# Patient Record
Sex: Female | Born: 1945
Health system: Southern US, Community
[De-identification: ages and names within clinical notes are randomized; demographics above are authoritative.]

## PROBLEM LIST (undated history)

## (undated) DIAGNOSIS — E785 Hyperlipidemia, unspecified: Secondary | ICD-10-CM

## (undated) DIAGNOSIS — I2510 Atherosclerotic heart disease of native coronary artery without angina pectoris: Secondary | ICD-10-CM

## (undated) DIAGNOSIS — F419 Anxiety disorder, unspecified: Secondary | ICD-10-CM

## (undated) DIAGNOSIS — I10 Essential (primary) hypertension: Secondary | ICD-10-CM

## (undated) DIAGNOSIS — S82841A Displaced bimalleolar fracture of right lower leg, initial encounter for closed fracture: Secondary | ICD-10-CM

## (undated) DIAGNOSIS — C50919 Malignant neoplasm of unspecified site of unspecified female breast: Secondary | ICD-10-CM

## (undated) DIAGNOSIS — I499 Cardiac arrhythmia, unspecified: Secondary | ICD-10-CM

## (undated) DIAGNOSIS — M1990 Unspecified osteoarthritis, unspecified site: Secondary | ICD-10-CM

## (undated) DIAGNOSIS — K219 Gastro-esophageal reflux disease without esophagitis: Secondary | ICD-10-CM

## (undated) DIAGNOSIS — Z8619 Personal history of other infectious and parasitic diseases: Secondary | ICD-10-CM

## (undated) DIAGNOSIS — T7840XA Allergy, unspecified, initial encounter: Secondary | ICD-10-CM

## (undated) DIAGNOSIS — E559 Vitamin D deficiency, unspecified: Secondary | ICD-10-CM

## (undated) HISTORY — DX: Hyperlipidemia, unspecified: E78.5

## (undated) HISTORY — DX: Vitamin D deficiency, unspecified: E55.9

## (undated) HISTORY — DX: Personal history of other infectious and parasitic diseases: Z86.19

## (undated) HISTORY — DX: Malignant neoplasm of unspecified site of unspecified female breast: C50.919

## (undated) HISTORY — DX: Allergy, unspecified, initial encounter: T78.40XA

## (undated) HISTORY — DX: Gastro-esophageal reflux disease without esophagitis: K21.9

## (undated) HISTORY — DX: Unspecified osteoarthritis, unspecified site: M19.90

## (undated) HISTORY — DX: Atherosclerotic heart disease of native coronary artery without angina pectoris: I25.10

## (undated) HISTORY — PX: TUBAL LIGATION: SHX77

## (undated) HISTORY — DX: Essential (primary) hypertension: I10

## (undated) HISTORY — PX: CATARACT EXTRACTION, BILATERAL: SHX1313

---
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
## 1984-05-17 HISTORY — PX: CRANIECTOMY FOR DEPRESSED SKULL FRACTURE: SHX5788

## 1992-05-17 DIAGNOSIS — C50919 Malignant neoplasm of unspecified site of unspecified female breast: Secondary | ICD-10-CM

## 1992-05-17 HISTORY — PX: BREAST LUMPECTOMY: SHX2

## 1992-05-17 HISTORY — PX: BREAST BIOPSY: SHX20

## 1992-05-17 HISTORY — DX: Malignant neoplasm of unspecified site of unspecified female breast: C50.919

## 2006-05-17 DIAGNOSIS — C44311 Basal cell carcinoma of skin of nose: Secondary | ICD-10-CM

## 2006-05-17 DIAGNOSIS — I2510 Atherosclerotic heart disease of native coronary artery without angina pectoris: Secondary | ICD-10-CM

## 2006-05-17 HISTORY — PX: OTHER SURGICAL HISTORY: SHX169

## 2006-05-17 HISTORY — DX: Basal cell carcinoma of skin of nose: C44.311

## 2006-05-17 HISTORY — PX: CORONARY ANGIOPLASTY WITH STENT PLACEMENT: SHX49

## 2006-05-17 HISTORY — DX: Atherosclerotic heart disease of native coronary artery without angina pectoris: I25.10

## 2007-03-15 DIAGNOSIS — I219 Acute myocardial infarction, unspecified: Secondary | ICD-10-CM

## 2007-03-15 HISTORY — DX: Acute myocardial infarction, unspecified: I21.9

## 2008-05-31 LAB — HM COLONOSCOPY: HM Colonoscopy: NORMAL

## 2009-05-17 LAB — HM PAP SMEAR: HM Pap smear: NORMAL

## 2011-06-18 LAB — HM MAMMOGRAPHY: HM MAMMO: NORMAL

## 2013-09-19 ENCOUNTER — Ambulatory Visit (INDEPENDENT_AMBULATORY_CARE_PROVIDER_SITE_OTHER): Payer: Medicare Other | Admitting: Internal Medicine

## 2013-09-19 ENCOUNTER — Other Ambulatory Visit (INDEPENDENT_AMBULATORY_CARE_PROVIDER_SITE_OTHER): Payer: Medicare Other

## 2013-09-19 ENCOUNTER — Encounter: Payer: Self-pay | Admitting: Internal Medicine

## 2013-09-19 VITALS — BP 138/86 | HR 71 | Temp 97.7°F | Ht 62.0 in | Wt 154.0 lb

## 2013-09-19 DIAGNOSIS — Z1239 Encounter for other screening for malignant neoplasm of breast: Secondary | ICD-10-CM

## 2013-09-19 DIAGNOSIS — E559 Vitamin D deficiency, unspecified: Secondary | ICD-10-CM

## 2013-09-19 DIAGNOSIS — E785 Hyperlipidemia, unspecified: Secondary | ICD-10-CM

## 2013-09-19 DIAGNOSIS — Z23 Encounter for immunization: Secondary | ICD-10-CM

## 2013-09-19 DIAGNOSIS — I2510 Atherosclerotic heart disease of native coronary artery without angina pectoris: Secondary | ICD-10-CM

## 2013-09-19 DIAGNOSIS — M899 Disorder of bone, unspecified: Secondary | ICD-10-CM

## 2013-09-19 DIAGNOSIS — Z0000 Encounter for general adult medical examination without abnormal findings: Secondary | ICD-10-CM

## 2013-09-19 DIAGNOSIS — I10 Essential (primary) hypertension: Secondary | ICD-10-CM

## 2013-09-19 DIAGNOSIS — M8580 Other specified disorders of bone density and structure, unspecified site: Secondary | ICD-10-CM

## 2013-09-19 DIAGNOSIS — M949 Disorder of cartilage, unspecified: Secondary | ICD-10-CM

## 2013-09-19 DIAGNOSIS — K219 Gastro-esophageal reflux disease without esophagitis: Secondary | ICD-10-CM | POA: Insufficient documentation

## 2013-09-19 DIAGNOSIS — Z853 Personal history of malignant neoplasm of breast: Secondary | ICD-10-CM | POA: Insufficient documentation

## 2013-09-19 LAB — CBC WITH DIFFERENTIAL/PLATELET
BASOS PCT: 0.4 % (ref 0.0–3.0)
Basophils Absolute: 0 10*3/uL (ref 0.0–0.1)
EOS ABS: 0.2 10*3/uL (ref 0.0–0.7)
Eosinophils Relative: 3.4 % (ref 0.0–5.0)
HCT: 43.6 % (ref 36.0–46.0)
HEMOGLOBIN: 15 g/dL (ref 12.0–15.0)
Lymphocytes Relative: 25.5 % (ref 12.0–46.0)
Lymphs Abs: 1.9 10*3/uL (ref 0.7–4.0)
MCHC: 34.3 g/dL (ref 30.0–36.0)
MCV: 92.7 fl (ref 78.0–100.0)
MONO ABS: 0.6 10*3/uL (ref 0.1–1.0)
Monocytes Relative: 8 % (ref 3.0–12.0)
NEUTROS ABS: 4.6 10*3/uL (ref 1.4–7.7)
Neutrophils Relative %: 62.7 % (ref 43.0–77.0)
Platelets: 299 10*3/uL (ref 150.0–400.0)
RBC: 4.7 Mil/uL (ref 3.87–5.11)
RDW: 12.7 % (ref 11.5–15.5)
WBC: 7.4 10*3/uL (ref 4.0–10.5)

## 2013-09-19 LAB — HEPATIC FUNCTION PANEL
ALT: 20 U/L (ref 0–35)
AST: 24 U/L (ref 0–37)
Albumin: 4.4 g/dL (ref 3.5–5.2)
Alkaline Phosphatase: 89 U/L (ref 39–117)
Bilirubin, Direct: 0.1 mg/dL (ref 0.0–0.3)
TOTAL PROTEIN: 7.6 g/dL (ref 6.0–8.3)
Total Bilirubin: 0.6 mg/dL (ref 0.2–1.2)

## 2013-09-19 LAB — TSH: TSH: 1.6 u[IU]/mL (ref 0.35–4.50)

## 2013-09-19 LAB — URINALYSIS, ROUTINE W REFLEX MICROSCOPIC
Bilirubin Urine: NEGATIVE
HGB URINE DIPSTICK: NEGATIVE
KETONES UR: NEGATIVE
Leukocytes, UA: NEGATIVE
Nitrite: NEGATIVE
Specific Gravity, Urine: 1.01 (ref 1.000–1.030)
TOTAL PROTEIN, URINE-UPE24: NEGATIVE
URINE GLUCOSE: NEGATIVE
Urobilinogen, UA: 0.2 (ref 0.0–1.0)
pH: 7 (ref 5.0–8.0)

## 2013-09-19 LAB — BASIC METABOLIC PANEL
BUN: 10 mg/dL (ref 6–23)
CHLORIDE: 100 meq/L (ref 96–112)
CO2: 27 meq/L (ref 19–32)
Calcium: 9.6 mg/dL (ref 8.4–10.5)
Creatinine, Ser: 0.8 mg/dL (ref 0.4–1.2)
GFR: 72.97 mL/min (ref >60.00)
Glucose, Bld: 101 mg/dL — ABNORMAL HIGH (ref 70–99)
Potassium: 4.1 mEq/L (ref 3.5–5.1)
SODIUM: 135 meq/L (ref 135–145)

## 2013-09-19 LAB — LIPID PANEL
Cholesterol: 167 mg/dL (ref 0–200)
HDL: 46.2 mg/dL (ref >39.00)
LDL Cholesterol: 88 mg/dL (ref 0–99)
TRIGLYCERIDES: 162 mg/dL — AB (ref 0.0–149.0)
Total CHOL/HDL Ratio: 4
VLDL: 32.4 mg/dL (ref 0.0–40.0)

## 2013-09-19 MED ORDER — ATORVASTATIN CALCIUM 20 MG PO TABS: 20.0000 mg | ORAL_TABLET | Freq: Every day | ORAL | Status: DC

## 2013-09-19 MED ORDER — METOPROLOL TARTRATE 25 MG PO TABS: 25.0000 mg | ORAL_TABLET | Freq: Two times a day (BID) | ORAL | Status: DC

## 2013-09-19 NOTE — Progress Notes (Signed)
Subjective:    Patient ID: Sarah Hendricks, female    DOB: [birth year], 68 y.o.   MRN: [id number]  HPI  New patient to me and our practice, here today to establish care with primary care provider  Reviewed chronic issues today:  CAD. Reports stent in 2008. On medical management for same. No angina symptoms, chest pain, edema.  Hypertension - the patient reports compliance with medication(s) as prescribed. Denies adverse side effects.  Dyslipidemia. On statin for same. the patient reports compliance with medication(s) as prescribed. Denies adverse side effects.  Breast cancer history 1994.  Past Medical History  Diagnosis Date  . GERD (gastroesophageal reflux disease)   . Breast cancer 1994    lumpectomy and XRT, no chemo  . Hyperlipidemia   . Hypertension   . History of chicken pox   . CAD (coronary artery disease) 2008    "stent"  . Vitamin D deficiency    Family History  Problem Relation Age of Onset  . Breast cancer Mother   . Hypertension Mother   . Heart disease Mother   . Stroke Mother   . Arthritis Father    History  Substance Use Topics  . Smoking status: Never Smoker   . Smokeless tobacco: Not on file  . Alcohol Use: No    Review of Systems  Constitutional: Negative for fatigue and unexpected weight change.  Respiratory: Negative for cough, shortness of breath and wheezing.   Cardiovascular: Negative for chest pain, palpitations and leg swelling.  Gastrointestinal: Negative for nausea, abdominal pain and diarrhea.  Neurological: Negative for dizziness, weakness, light-headedness and headaches.  Psychiatric/Behavioral: Negative for dysphoric mood. The patient is not nervous/anxious.   All other systems reviewed and are negative.      Objective:   Physical Exam  BP 138/86  Pulse 71  Temp(Src) 97.7 F (36.5 C) (Oral)  Ht 5\' 2"  (1.575 m)  Wt 154 lb (69.854 kg)  BMI 28.16 kg/m2  SpO2 96% Wt Readings from Last 3 Encounters:  09/19/13 154 lb  (69.854 kg)   Constitutional: She appears well-developed and well-nourished. No distress.  HENT: Head: Normocephalic and atraumatic. Ears: B TMs ok, no erythema or effusion; Nose: Nose normal. Mouth/Throat: Oropharynx is clear and moist. No oropharyngeal exudate.  Eyes: Conjunctivae and EOM are normal. Pupils are equal, round, and reactive to light. No scleral icterus.  Neck: Normal range of motion. Neck supple. No JVD present. No thyromegaly present.  Cardiovascular: Normal rate, regular rhythm and normal heart sounds.  No murmur heard. No BLE edema. Pulmonary/Chest: Effort normal and breath sounds normal. No respiratory distress. She has no wheezes.  Abdominal: Soft. Bowel sounds are normal. She exhibits no distension. There is no tenderness. no masses Musculoskeletal: Normal range of motion, no joint effusions. No gross deformities Neurological: She is alert and oriented to person, place, and time. No cranial nerve deficit. Coordination, balance, strength, speech and gait are normal.  Skin: Skin is warm and dry. No rash noted. No erythema.  Psychiatric: She has a normal mood and affect. Her behavior is normal. Judgment and thought content normal.    No results found for this basename: WBC,  HGB,  HCT,  PLT,  GLUCOSE,  CHOL,  TRIG,  HDL,  LDLDIRECT,  LDLCALC,  ALT,  AST,  NA,  K,  CL,  CREATININE,  BUN,  CO2,  TSH,  PSA,  INR,  GLUF,  HGBA1C,  MICROALBUR    Patient was never admitted.     Assessment &  Plan:   CPX/v70.0 - Patient has been counseled on age-appropriate routine health concerns for screening and prevention. These are reviewed and up-to-date. Immunizations are up-to-date or declined. Labs ordered - ROI to be reviewed. Refer for mammo, DEXA and get colo report  Problem List Items Addressed This Visit   CAD (coronary artery disease)     Stent following MI in 2008 Send for ROI from [place] No anginal symptoms Medication mgmt ongoing Check labs, titrate meds as needed     Relevant Medications      aspirin 81 MG tablet      atorvastatin (LIPITOR) tablet      metoprolol tartrate (LOPRESSOR) tablet   Hyperlipidemia     On statin Check labs, titrate as needed    Relevant Medications      aspirin 81 MG tablet      atorvastatin (LIPITOR) tablet      metoprolol tartrate (LOPRESSOR) tablet   Hypertension      BP Readings from Last 3 Encounters:  09/19/13 138/86   The current medical regimen is effective;  continue present plan and medications.     Relevant Medications      aspirin 81 MG tablet      atorvastatin (LIPITOR) tablet      metoprolol tartrate (LOPRESSOR) tablet   Vitamin D deficiency     Check level     Relevant Orders      Vit D  25 hydroxy (rtn osteoporosis monitoring) (Completed)    Other Visit Diagnoses   Routine general medical examination at a health care facility    -  Primary    Relevant Orders       Basic metabolic panel (Completed)       CBC with Differential (Completed)       Hepatic function panel (Completed)       Lipid panel (Completed)       TSH (Completed)       Urinalysis, Routine w reflex microscopic (Completed)    Other screening breast examination        Relevant Orders       MM DIGITAL SCREENING BILATERAL    Osteopenia        Relevant Orders       DG Bone Density    Need for prophylactic vaccination against Streptococcus pneumoniae (pneumococcus)        Relevant Orders       Pneumococcal polysaccharide vaccine 23-valent greater than or equal to 2yo subcutaneous/IM

## 2013-09-19 NOTE — Patient Instructions (Addendum)
It was good to see you today.  We have reviewed your prior records including labs and tests today  we will send to your prior provider(s) for "release of records" as discussed today.  Health Maintenance reviewed - Pneumonia vaccine updated today, we'll perform tetanus at next visit Refer for mammogram and bone density Other screening up-to-date  Test(s) ordered today. Your results will be released to [place] (or called to you) after review, usually within 72hours after test completion. If any changes need to be made, you will be notified at that same time.  Medications reviewed and updated, no changes recommended at this time. Refill on medication(s) as discussed today.  Please schedule followup in 6 months for semiannual exam and labs, call sooner if problems.  Health Maintenance, Female A healthy lifestyle and preventative care can promote health and wellness.  Maintain regular health, dental, and eye exams.  Eat a healthy diet. Foods like vegetables, fruits, whole grains, low-fat dairy products, and lean protein foods contain the nutrients you need without too many calories. Decrease your intake of foods high in solid fats, added sugars, and salt. Get information about a proper diet from your caregiver, if necessary.  Regular physical exercise is one of the most important things you can do for your health. Most adults should get at least 150 minutes of moderate-intensity exercise (any activity that increases your heart rate and causes you to sweat) each week. In addition, most adults need muscle-strengthening exercises on 2 or more days a week.   Maintain a healthy weight. The body mass index (BMI) is a screening tool to identify possible weight problems. It provides an estimate of body fat based on height and weight. Your caregiver can help determine your BMI, and can help you achieve or maintain a healthy weight. For adults 20 years and older:  A BMI below 18.5 is considered  underweight.  A BMI of 18.5 to 24.9 is normal.  A BMI of 25 to 29.9 is considered overweight.  A BMI of 30 and above is considered obese.  Maintain normal blood lipids and cholesterol by exercising and minimizing your intake of saturated fat. Eat a balanced diet with plenty of fruits and vegetables. Blood tests for lipids and cholesterol should begin at age 40 and be repeated every 5 years. If your lipid or cholesterol levels are high, you are over 50, or you are a high risk for heart disease, you may need your cholesterol levels checked more frequently.Ongoing high lipid and cholesterol levels should be treated with medicines if diet and exercise are not effective.  If you smoke, find out from your caregiver how to quit. If you do not use tobacco, do not start.  Lung cancer screening is recommended for adults aged 42 80 years who are at high risk for developing lung cancer because of a history of smoking. Yearly low-dose computed tomography (CT) is recommended for people who have at least a 30-pack-year history of smoking and are a current smoker or have quit within the past 15 years. A pack year of smoking is smoking an average of 1 pack of cigarettes a day for 1 year (for example: 1 pack a day for 30 years or 2 packs a day for 15 years). Yearly screening should continue until the smoker has stopped smoking for at least 15 years. Yearly screening should also be stopped for people who develop a health problem that would prevent them from having lung cancer treatment.  If you are pregnant, do  not drink alcohol. If you are breastfeeding, be very cautious about drinking alcohol. If you are not pregnant and choose to drink alcohol, do not exceed 1 drink per day. One drink is considered to be 12 ounces (355 mL) of beer, 5 ounces (148 mL) of wine, or 1.5 ounces (44 mL) of liquor.  Avoid use of street drugs. Do not share needles with anyone. Ask for help if you need support or instructions about stopping  the use of drugs.  High blood pressure causes heart disease and increases the risk of stroke. Blood pressure should be checked at least every 1 to 2 years. Ongoing high blood pressure should be treated with medicines, if weight loss and exercise are not effective.  If you are 50 to 67 years old, ask your caregiver if you should take aspirin to prevent strokes.  Diabetes screening involves taking a blood sample to check your fasting blood sugar level. This should be done once every 3 years, after age 31, if you are within normal weight and without risk factors for diabetes. Testing should be considered at a younger age or be carried out more frequently if you are overweight and have at least 1 risk factor for diabetes.  Breast cancer screening is essential preventative care for women. You should practice "breast self-awareness." This means understanding the normal appearance and feel of your breasts and may include breast self-examination. Any changes detected, no matter how small, should be reported to a caregiver. Women in their 2s and 30s should have a clinical breast exam (CBE) by a caregiver as part of a regular health exam every 1 to 3 years. After age 48, women should have a CBE every year. Starting at age 3, women should consider having a mammogram (breast X-ray) every year. Women who have a family history of breast cancer should talk to their caregiver about genetic screening. Women at a high risk of breast cancer should talk to their caregiver about having an MRI and a mammogram every year.  Breast cancer gene (BRCA)-related cancer risk assessment is recommended for women who have family members with BRCA-related cancers. BRCA-related cancers include breast, ovarian, tubal, and peritoneal cancers. Having family members with these cancers may be associated with an increased risk for harmful changes (mutations) in the breast cancer genes BRCA1 and BRCA2. Results of the assessment will determine  the need for genetic counseling and BRCA1 and BRCA2 testing.  The Pap test is a screening test for cervical cancer. Women should have a Pap test starting at age 58. Between ages 68 and 75, Pap tests should be repeated every 2 years. Beginning at age 32, you should have a Pap test every 3 years as long as the past 3 Pap tests have been normal. If you had a hysterectomy for a problem that was not cancer or a condition that could lead to cancer, then you no longer need Pap tests. If you are between ages 3 and 78, and you have had normal Pap tests going back 10 years, you no longer need Pap tests. If you have had past treatment for cervical cancer or a condition that could lead to cancer, you need Pap tests and screening for cancer for at least 20 years after your treatment. If Pap tests have been discontinued, risk factors (such as a new sexual partner) need to be reassessed to determine if screening should be resumed. Some women have medical problems that increase the chance of getting cervical cancer. In these cases, your  caregiver may recommend more frequent screening and Pap tests.  The human papillomavirus (HPV) test is an additional test that may be used for cervical cancer screening. The HPV test looks for the virus that can cause the cell changes on the cervix. The cells collected during the Pap test can be tested for HPV. The HPV test could be used to screen women aged 63 years and older, and should be used in women of any age who have unclear Pap test results. After the age of 58, women should have HPV testing at the same frequency as a Pap test.  Colorectal cancer can be detected and often prevented. Most routine colorectal cancer screening begins at the age of 50 and continues through age 63. However, your caregiver may recommend screening at an earlier age if you have risk factors for colon cancer. On a yearly basis, your caregiver may provide home test kits to check for hidden blood in the stool.  Use of a small camera at the end of a tube, to directly examine the colon (sigmoidoscopy or colonoscopy), can detect the earliest forms of colorectal cancer. Talk to your caregiver about this at age 58, when routine screening begins. Direct examination of the colon should be repeated every 5 to 10 years through age 91, unless early forms of pre-cancerous polyps or small growths are found.  Hepatitis C blood testing is recommended for all people born from [birth year] through 1964 and any individual with known risks for hepatitis C.  Practice safe sex. Use condoms and avoid high-risk sexual practices to reduce the spread of sexually transmitted infections (STIs). Sexually active women aged 31 and younger should be checked for Chlamydia, which is a common sexually transmitted infection. Older women with new or multiple partners should also be tested for Chlamydia. Testing for other STIs is recommended if you are sexually active and at increased risk.  Osteoporosis is a disease in which the bones lose minerals and strength with aging. This can result in serious bone fractures. The risk of osteoporosis can be identified using a bone density scan. Women ages 88 and over and women at risk for fractures or osteoporosis should discuss screening with their caregivers. Ask your caregiver whether you should be taking a calcium supplement or vitamin D to reduce the rate of osteoporosis.  Menopause can be associated with physical symptoms and risks. Hormone replacement therapy is available to decrease symptoms and risks. You should talk to your caregiver about whether hormone replacement therapy is right for you.  Use sunscreen. Apply sunscreen liberally and repeatedly throughout the day. You should seek shade when your shadow is shorter than you. Protect yourself by wearing long sleeves, pants, a wide-brimmed hat, and sunglasses year round, whenever you are outdoors.  Notify your caregiver of new moles or changes in moles,  especially if there is a change in shape or color. Also notify your caregiver if a mole is larger than the size of a pencil eraser.  Stay current with your immunizations. Document Released: 11/16/2010 Document Revised: 08/28/2012 Document Reviewed: 11/16/2010 [facility] Patient Information 2014 [place].

## 2013-09-19 NOTE — Progress Notes (Signed)
Pre visit review using our clinic review tool, if applicable. No additional management support is needed unless otherwise documented below in the visit note. 

## 2013-09-20 ENCOUNTER — Encounter: Payer: Self-pay | Admitting: Internal Medicine

## 2013-09-20 LAB — VITAMIN D 25 HYDROXY (VIT D DEFICIENCY, FRACTURES): VIT D 25 HYDROXY: 39 ng/mL (ref 30–89)

## 2013-09-20 NOTE — Assessment & Plan Note (Signed)
Check level 

## 2013-09-20 NOTE — Assessment & Plan Note (Signed)
Stent following MI in 2008 Send for ROI from [place] No anginal symptoms Medication mgmt ongoing Check labs, titrate meds as needed

## 2013-09-20 NOTE — Assessment & Plan Note (Signed)
BP Readings from Last 3 Encounters:  09/19/13 138/86   The current medical regimen is effective;  continue present plan and medications.

## 2013-09-20 NOTE — Assessment & Plan Note (Signed)
On statin Check labs, titrate as needed

## 2013-10-01 ENCOUNTER — Ambulatory Visit
Admission: RE | Admit: 2013-10-01 | Discharge: 2013-10-01 | Disposition: A | Discharge status: Home / Self Care | Payer: Medicare Other | Source: Ambulatory Visit | Attending: Internal Medicine | Admitting: Internal Medicine

## 2013-10-01 DIAGNOSIS — Z1239 Encounter for other screening for malignant neoplasm of breast: Secondary | ICD-10-CM

## 2014-03-22 ENCOUNTER — Ambulatory Visit: Payer: Medicare Other | Admitting: Internal Medicine

## 2014-05-30 ENCOUNTER — Other Ambulatory Visit (INDEPENDENT_AMBULATORY_CARE_PROVIDER_SITE_OTHER): Payer: Medicare Other

## 2014-05-30 ENCOUNTER — Ambulatory Visit (INDEPENDENT_AMBULATORY_CARE_PROVIDER_SITE_OTHER): Payer: Medicare Other | Admitting: Internal Medicine

## 2014-05-30 ENCOUNTER — Encounter: Payer: Self-pay | Admitting: Internal Medicine

## 2014-05-30 VITALS — BP 138/84 | HR 64 | Temp 97.5°F | Ht 62.0 in | Wt 155.5 lb

## 2014-05-30 DIAGNOSIS — E785 Hyperlipidemia, unspecified: Secondary | ICD-10-CM

## 2014-05-30 DIAGNOSIS — M8580 Other specified disorders of bone density and structure, unspecified site: Secondary | ICD-10-CM

## 2014-05-30 DIAGNOSIS — Z0000 Encounter for general adult medical examination without abnormal findings: Secondary | ICD-10-CM

## 2014-05-30 DIAGNOSIS — I2510 Atherosclerotic heart disease of native coronary artery without angina pectoris: Secondary | ICD-10-CM

## 2014-05-30 DIAGNOSIS — Z23 Encounter for immunization: Secondary | ICD-10-CM

## 2014-05-30 DIAGNOSIS — I10 Essential (primary) hypertension: Secondary | ICD-10-CM

## 2014-05-30 DIAGNOSIS — M810 Age-related osteoporosis without current pathological fracture: Secondary | ICD-10-CM | POA: Insufficient documentation

## 2014-05-30 LAB — HEPATIC FUNCTION PANEL
ALBUMIN: 4.3 g/dL (ref 3.5–5.2)
ALT: 18 U/L (ref 0–35)
AST: 19 U/L (ref 0–37)
Alkaline Phosphatase: 107 U/L (ref 39–117)
BILIRUBIN DIRECT: 0.1 mg/dL (ref 0.0–0.3)
TOTAL PROTEIN: 7.7 g/dL (ref 6.0–8.3)
Total Bilirubin: 0.6 mg/dL (ref 0.2–1.2)

## 2014-05-30 LAB — BASIC METABOLIC PANEL
BUN: 12 mg/dL (ref 6–23)
CALCIUM: 9.7 mg/dL (ref 8.4–10.5)
CO2: 24 mEq/L (ref 19–32)
Chloride: 102 mEq/L (ref 96–112)
Creatinine, Ser: 0.83 mg/dL (ref 0.40–1.20)
GFR: 72.82 mL/min (ref >60.00)
Glucose, Bld: 107 mg/dL — ABNORMAL HIGH (ref 70–99)
Potassium: 4.1 mEq/L (ref 3.5–5.1)
SODIUM: 137 meq/L (ref 135–145)

## 2014-05-30 LAB — CBC WITH DIFFERENTIAL/PLATELET
BASOS ABS: 0 10*3/uL (ref 0.0–0.1)
Basophils Relative: 0.5 % (ref 0.0–3.0)
EOS ABS: 0.3 10*3/uL (ref 0.0–0.7)
EOS PCT: 3.8 % (ref 0.0–5.0)
HEMATOCRIT: 44.3 % (ref 36.0–46.0)
Hemoglobin: 15 g/dL (ref 12.0–15.0)
Lymphocytes Relative: 25 % (ref 12.0–46.0)
Lymphs Abs: 1.7 10*3/uL (ref 0.7–4.0)
MCHC: 33.8 g/dL (ref 30.0–36.0)
MCV: 92.9 fl (ref 78.0–100.0)
MONO ABS: 0.8 10*3/uL (ref 0.1–1.0)
Monocytes Relative: 11.5 % (ref 3.0–12.0)
NEUTROS ABS: 4.1 10*3/uL (ref 1.4–7.7)
Neutrophils Relative %: 59.2 % (ref 43.0–77.0)
Platelets: 273 10*3/uL (ref 150.0–400.0)
RBC: 4.77 Mil/uL (ref 3.87–5.11)
RDW: 13 % (ref 11.5–15.5)
WBC: 7 10*3/uL (ref 4.0–10.5)

## 2014-05-30 LAB — LIPID PANEL
CHOL/HDL RATIO: 5
CHOLESTEROL: 184 mg/dL (ref 0–200)
HDL: 40.4 mg/dL (ref >39.00)
NonHDL: 143.6
Triglycerides: 287 mg/dL — ABNORMAL HIGH (ref 0.0–149.0)
VLDL: 57.4 mg/dL — ABNORMAL HIGH (ref 0.0–40.0)

## 2014-05-30 LAB — TSH: TSH: 1.69 u[IU]/mL (ref 0.35–4.50)

## 2014-05-30 LAB — LDL CHOLESTEROL, DIRECT: Direct LDL: 101 mg/dL

## 2014-05-30 MED ORDER — ATORVASTATIN CALCIUM 10 MG PO TABS: 10.0000 mg | ORAL_TABLET | Freq: Every day | ORAL | Status: DC

## 2014-05-30 MED ORDER — METOPROLOL TARTRATE 25 MG PO TABS: 12.5000 mg | ORAL_TABLET | Freq: Two times a day (BID) | ORAL | Status: DC

## 2014-05-30 NOTE — Patient Instructions (Addendum)
It was good to see you today.  We have reviewed your prior records including labs and tests today  Prevnar 13 pneumonia vaccine updated today - consider Tdap next visit  Health Maintenance reviewed - all recommended immunizations and age-appropriate screenings are up-to-date.  Test(s) ordered today. Your results will be released to [place] (or called to you) after review, usually within 72hours after test completion. If any changes need to be made, you will be notified at that same time.  Medications reviewed and updated, no changes recommended at this time.  Please schedule followup in 12 months for annual exam and labs, call sooner if problems.  Health Maintenance Adopting a healthy lifestyle and getting preventive care can go a long way to promote health and wellness. Talk with your health care provider about what schedule of regular examinations is right for you. This is a good chance for you to check in with your provider about disease prevention and staying healthy. In between checkups, there are plenty of things you can do on your own. Experts have done a lot of research about which lifestyle changes and preventive measures are most likely to keep you healthy. Ask your health care provider for more information. WEIGHT AND DIET  Eat a healthy diet  Be sure to include plenty of vegetables, fruits, low-fat dairy products, and lean protein.  Do not eat a lot of foods high in solid fats, added sugars, or salt.  Get regular exercise. This is one of the most important things you can do for your health.  Most adults should exercise for at least 150 minutes each week. The exercise should increase your heart rate and make you sweat (moderate-intensity exercise).  Most adults should also do strengthening exercises at least twice a week. This is in addition to the moderate-intensity exercise.  Maintain a healthy weight  Body mass index (BMI) is a measurement that can be used to identify  possible weight problems. It estimates body fat based on height and weight. Your health care provider can help determine your BMI and help you achieve or maintain a healthy weight.  For females 54 years of age and older:   A BMI below 18.5 is considered underweight.  A BMI of 18.5 to 24.9 is normal.  A BMI of 25 to 29.9 is considered overweight.  A BMI of 30 and above is considered obese.  Watch levels of cholesterol and blood lipids  You should start having your blood tested for lipids and cholesterol at 68 years of age, then have this test every 5 years.  You may need to have your cholesterol levels checked more often if:  Your lipid or cholesterol levels are high.  You are older than 68 years of age.  You are at high risk for heart disease.  CANCER SCREENING   Lung Cancer  Lung cancer screening is recommended for adults 83-47 years old who are at high risk for lung cancer because of a history of smoking.  A yearly low-dose CT scan of the lungs is recommended for people who:  Currently smoke.  Have quit within the past 15 years.  Have at least a 30-pack-year history of smoking. A pack year is smoking an average of one pack of cigarettes a day for 1 year.  Yearly screening should continue until it has been 15 years since you quit.  Yearly screening should stop if you develop a health problem that would prevent you from having lung cancer treatment.  Breast Cancer  Practice breast self-awareness. This means understanding how your breasts normally appear and feel.  It also means doing regular breast self-exams. Let your health care provider know about any changes, no matter how small.  If you are in your 20s or 30s, you should have a clinical breast exam (CBE) by a health care provider every 1-3 years as part of a regular health exam.  If you are 51 or older, have a CBE every year. Also consider having a breast X-ray (mammogram) every year.  If you have a family  history of breast cancer, talk to your health care provider about genetic screening.  If you are at high risk for breast cancer, talk to your health care provider about having an MRI and a mammogram every year.  Breast cancer gene (BRCA) assessment is recommended for women who have family members with BRCA-related cancers. BRCA-related cancers include:  Breast.  Ovarian.  Tubal.  Peritoneal cancers.  Results of the assessment will determine the need for genetic counseling and BRCA1 and BRCA2 testing. Cervical Cancer Routine pelvic examinations to screen for cervical cancer are no longer recommended for nonpregnant women who are considered low risk for cancer of the pelvic organs (ovaries, uterus, and vagina) and who do not have symptoms. A pelvic examination may be necessary if you have symptoms including those associated with pelvic infections. Ask your health care provider if a screening pelvic exam is right for you.   The Pap test is the screening test for cervical cancer for women who are considered at risk.  If you had a hysterectomy for a problem that was not cancer or a condition that could lead to cancer, then you no longer need Pap tests.  If you are older than 65 years, and you have had normal Pap tests for the past 10 years, you no longer need to have Pap tests.  If you have had past treatment for cervical cancer or a condition that could lead to cancer, you need Pap tests and screening for cancer for at least 20 years after your treatment.  If you no longer get a Pap test, assess your risk factors if they change (such as having a new sexual partner). This can affect whether you should start being screened again.  Some women have medical problems that increase their chance of getting cervical cancer. If this is the case for you, your health care provider may recommend more frequent screening and Pap tests.  The human papillomavirus (HPV) test is another test that may be used  for cervical cancer screening. The HPV test looks for the virus that can cause cell changes in the cervix. The cells collected during the Pap test can be tested for HPV.  The HPV test can be used to screen women 62 years of age and older. Getting tested for HPV can extend the interval between normal Pap tests from three to five years.  An HPV test also should be used to screen women of any age who have unclear Pap test results.  After 68 years of age, women should have HPV testing as often as Pap tests.  Colorectal Cancer  This type of cancer can be detected and often prevented.  Routine colorectal cancer screening usually begins at 68 years of age and continues through 68 years of age.  Your health care provider may recommend screening at an earlier age if you have risk factors for colon cancer.  Your health care provider may also recommend using home test kits  to check for hidden blood in the stool.  A small camera at the end of a tube can be used to examine your colon directly (sigmoidoscopy or colonoscopy). This is done to check for the earliest forms of colorectal cancer.  Routine screening usually begins at age 6.  Direct examination of the colon should be repeated every 5-10 years through 68 years of age. However, you may need to be screened more often if early forms of precancerous polyps or small growths are found. Skin Cancer  Check your skin from head to toe regularly.  Tell your health care provider about any new moles or changes in moles, especially if there is a change in a mole's shape or color.  Also tell your health care provider if you have a mole that is larger than the size of a pencil eraser.  Always use sunscreen. Apply sunscreen liberally and repeatedly throughout the day.  Protect yourself by wearing long sleeves, pants, a wide-brimmed hat, and sunglasses whenever you are outside. HEART DISEASE, DIABETES, AND HIGH BLOOD PRESSURE   Have your blood pressure  checked at least every 1-2 years. High blood pressure causes heart disease and increases the risk of stroke.  If you are between 72 years and 61 years old, ask your health care provider if you should take aspirin to prevent strokes.  Have regular diabetes screenings. This involves taking a blood sample to check your fasting blood sugar level.  If you are at a normal weight and have a low risk for diabetes, have this test once every three years after 68 years of age.  If you are overweight and have a high risk for diabetes, consider being tested at a younger age or more often. PREVENTING INFECTION  Hepatitis B  If you have a higher risk for hepatitis B, you should be screened for this virus. You are considered at high risk for hepatitis B if:  You were born in a country where hepatitis B is common. Ask your health care provider which countries are considered high risk.  Your parents were born in a high-risk country, and you have not been immunized against hepatitis B (hepatitis B vaccine).  You have HIV or AIDS.  You use needles to inject street drugs.  You live with someone who has hepatitis B.  You have had sex with someone who has hepatitis B.  You get hemodialysis treatment.  You take certain medicines for conditions, including cancer, organ transplantation, and autoimmune conditions. Hepatitis C  Blood testing is recommended for:  Everyone born from [birth year] through 1964.  Anyone with known risk factors for hepatitis C. Sexually transmitted infections (STIs)  You should be screened for sexually transmitted infections (STIs) including gonorrhea and chlamydia if:  You are sexually active and are younger than 68 years of age.  You are older than 68 years of age and your health care provider tells you that you are at risk for this type of infection.  Your sexual activity has changed since you were last screened and you are at an increased risk for chlamydia or gonorrhea. Ask  your health care provider if you are at risk.  If you do not have HIV, but are at risk, it may be recommended that you take a prescription medicine daily to prevent HIV infection. This is called pre-exposure prophylaxis (PrEP). You are considered at risk if:  You are sexually active and do not regularly use condoms or know the HIV status of your partner(s).  You  take drugs by injection.  You are sexually active with a partner who has HIV. Talk with your health care provider about whether you are at high risk of being infected with HIV. If you choose to begin PrEP, you should first be tested for HIV. You should then be tested every 3 months for as long as you are taking PrEP.  PREGNANCY   If you are premenopausal and you may become pregnant, ask your health care provider about preconception counseling.  If you may become pregnant, take 400 to 800 micrograms (mcg) of folic acid every day.  If you want to prevent pregnancy, talk to your health care provider about birth control (contraception). OSTEOPOROSIS AND MENOPAUSE   Osteoporosis is a disease in which the bones lose minerals and strength with aging. This can result in serious bone fractures. Your risk for osteoporosis can be identified using a bone density scan.  If you are 79 years of age or older, or if you are at risk for osteoporosis and fractures, ask your health care provider if you should be screened.  Ask your health care provider whether you should take a calcium or vitamin D supplement to lower your risk for osteoporosis.  Menopause may have certain physical symptoms and risks.  Hormone replacement therapy may reduce some of these symptoms and risks. Talk to your health care provider about whether hormone replacement therapy is right for you.  HOME CARE INSTRUCTIONS   Schedule regular health, dental, and eye exams.  Stay current with your immunizations.   Do not use any tobacco products including cigarettes, chewing  tobacco, or electronic cigarettes.  If you are pregnant, do not drink alcohol.  If you are breastfeeding, limit how much and how often you drink alcohol.  Limit alcohol intake to no more than 1 drink per day for nonpregnant women. One drink equals 12 ounces of beer, 5 ounces of wine, or 1 ounces of hard liquor.  Do not use street drugs.  Do not share needles.  Ask your health care provider for help if you need support or information about quitting drugs.  Tell your health care provider if you often feel depressed.  Tell your health care provider if you have ever been abused or do not feel safe at home. Document Released: 11/16/2010 Document Revised: 09/17/2013 Document Reviewed: 04/04/2013 [facility] Patient Information 2015 [place], [place]. This information is not intended to replace advice given to you by your health care provider. Make sure you discuss any questions you have with your health care provider.

## 2014-05-30 NOTE — Assessment & Plan Note (Signed)
On statin -dose adjusted to reflect how pt is taking same (10 mg atorva qd) Check labs, titrate as needed

## 2014-05-30 NOTE — Assessment & Plan Note (Signed)
Stent following MI in 2008 while living in [place] No anginal symptoms Medication mgmt ongoing Check labs, titrate meds as needed

## 2014-05-30 NOTE — Assessment & Plan Note (Signed)
BP Readings from Last 3 Encounters:  05/30/14 138/84  09/19/13 138/86   The current medical regimen is effective;  continue present plan and medications.

## 2014-05-30 NOTE — Progress Notes (Signed)
Pre visit review using our clinic review tool, if applicable. No additional management support is needed unless otherwise documented below in the visit note. 

## 2014-05-30 NOTE — Progress Notes (Signed)
Subjective:    Patient ID: Sarah Hendricks, female    DOB: 04/12/1946, 69 y.o.   MRN: [id number]  HPI   Here for medicare wellness  Diet: heart healthy  Physical activity: sedentary Depression/mood screen: negative Hearing: intact to whispered voice Visual acuity: grossly normal, performs annual eye exam  ADLs: capable Fall risk: none Home safety: good Cognitive evaluation: intact to orientation, naming, recall and repetition EOL planning: adv directives, full code/ I agree  I have personally reviewed and have noted 1. The patient's medical and social history 2. Their use of alcohol, tobacco or illicit drugs 3. Their current medications and supplements 4. The patient's functional ability including ADL's, fall risks, home safety risks and hearing or visual impairment. 5. Diet and physical activities 6. Evidence for depression or mood disorders  Also reviewed chronic medical issues and interval medical events  Past Medical History  Diagnosis Date  . GERD (gastroesophageal reflux disease)   . Breast cancer 1994    lumpectomy and XRT, no chemo  . Hyperlipidemia   . Hypertension   . History of chicken pox   . CAD (coronary artery disease) 2008    "stent"  . Vitamin D deficiency    Family History  Problem Relation Age of Onset  . Breast cancer Mother   . Hypertension Mother   . Heart disease Mother   . Stroke Mother   . Arthritis Father    History  Substance Use Topics  . Smoking status: Never Smoker   . Smokeless tobacco: Not on file  . Alcohol Use: No    Review of Systems  Constitutional: Negative for fever, fatigue and unexpected weight change.  Respiratory: Negative for cough, shortness of breath and wheezing.   Cardiovascular: Negative for chest pain, palpitations and leg swelling.  Gastrointestinal: Negative for nausea, abdominal pain and diarrhea.  Neurological: Negative for dizziness, weakness, light-headedness and headaches.    Psychiatric/Behavioral: Negative for dysphoric mood. The patient is not nervous/anxious.   All other systems reviewed and are negative.      Objective:   Physical Exam  BP 138/84 mmHg  Pulse 64  Temp(Src) 97.5 F (36.4 C) (Oral)  Ht 5\' 2"  (1.575 m)  Wt 155 lb 8 oz (70.534 kg)  BMI 28.43 kg/m2  SpO2 97% Wt Readings from Last 3 Encounters:  05/30/14 155 lb 8 oz (70.534 kg)  09/19/13 154 lb (69.854 kg)   Constitutional: She is overweight, appears well-developed and well-nourished. No distress.  HENT: Head: Normocephalic and atraumatic. Ears: B TMs ok, no erythema or effusion; Nose: Nose normal. Mouth/Throat: Oropharynx is clear and moist. No oropharyngeal exudate.  Eyes: Conjunctivae and EOM are normal. Pupils are equal, round, and reactive to light. No scleral icterus.  Neck: Normal range of motion. Neck supple. No JVD present. No thyromegaly present.  Cardiovascular: Normal rate, regular rhythm and normal heart sounds.  No murmur heard. No BLE edema. Pulmonary/Chest: Effort normal and breath sounds normal. No respiratory distress. She has no wheezes.  Abdominal: Soft. Bowel sounds are normal. She exhibits no distension. There is no tenderness. no masses GU/breast: defer to gyn Musculoskeletal: Normal range of motion, no joint effusions. No gross deformities Neurological: She is alert and oriented to person, place, and time. No cranial nerve deficit. Coordination, balance, strength, speech and gait are normal.  Skin: Skin is warm and dry. No rash noted. No erythema.  Psychiatric: She has a normal mood and affect. Her behavior is normal. Judgment and thought content normal.  Lab Results  Component Value Date   WBC 7.4 09/19/2013   HGB 15.0 09/19/2013   HCT 43.6 09/19/2013   PLT 299.0 09/19/2013   GLUCOSE 101* 09/19/2013   CHOL 167 09/19/2013   TRIG 162.0* 09/19/2013   HDL 46.20 09/19/2013   LDLCALC 88 09/19/2013   ALT 20 09/19/2013   AST 24 09/19/2013   NA 135  09/19/2013   K 4.1 09/19/2013   CL 100 09/19/2013   CREATININE 0.8 09/19/2013   BUN 10 09/19/2013   CO2 27 09/19/2013   TSH 1.60 09/19/2013    Mm Digital Screening Bilateral  10/02/2013   CLINICAL DATA:  Screening.  EXAM: DIGITAL SCREENING BILATERAL MAMMOGRAM WITH CAD  COMPARISON:  Previous exam(s)  ACR Breast Density Category a: The breast tissue is almost entirely fatty.  FINDINGS: There are no findings suspicious for malignancy. Images were processed with CAD.  IMPRESSION: No mammographic evidence of malignancy. A result letter of this screening mammogram will be mailed directly to the patient.  RECOMMENDATION: Screening mammogram in one year. (Code:[id number]-01Y)  BI-RADS CATEGORY  1: Negative.   Electronically Signed   By: Luberta Robertson M.D.   On: 10/02/2013 08:17       Assessment & Plan:   AWV/z00.00 - Today patient counseled on age appropriate routine health concerns for screening and prevention, each reviewed and up to date or declined. Immunizations reviewed and up to date or declined. Labs ordered and reviewed. Risk factors for depression reviewed and negative. Hearing function and visual acuity are intact. ADLs screened and addressed as needed. Functional ability and level of safety reviewed and appropriate. Education, counseling and referrals performed based on assessed risks today. Patient provided with a copy of personalized plan for preventive services.  Problem List Items Addressed This Visit    CAD (coronary artery disease)    Stent following MI in 2008 while living in [place] No anginal symptoms Medication mgmt ongoing Check labs, titrate meds as needed      Relevant Medications   atorvastatin (LIPITOR) tablet   metoprolol tartrate (LOPRESSOR) tablet   Hyperlipidemia    On statin -dose adjusted to reflect how pt is taking same (10 mg atorva qd) Check labs, titrate as needed      Relevant Medications   atorvastatin (LIPITOR) tablet   metoprolol tartrate (LOPRESSOR) tablet     Hypertension    BP Readings from Last 3 Encounters:  05/30/14 138/84  09/19/13 138/86   The current medical regimen is effective;  continue present plan and medications.       Relevant Medications   atorvastatin (LIPITOR) tablet   metoprolol tartrate (LOPRESSOR) tablet   Osteopenia   Relevant Orders   DG Bone Density    Other Visit Diagnoses    Routine general medical examination at a health care facility    -  Primary    Relevant Orders    Lipid panel    Basic metabolic panel    CBC with Differential    Hepatic function panel    TSH    Need for prophylactic vaccination against Streptococcus pneumoniae (pneumococcus)        Relevant Orders    Pneumococcal conjugate vaccine 13-valent IM (Completed)

## 2014-06-05 ENCOUNTER — Ambulatory Visit (INDEPENDENT_AMBULATORY_CARE_PROVIDER_SITE_OTHER)
Admission: RE | Admit: 2014-06-05 | Discharge: 2014-06-05 | Disposition: A | Discharge status: Home / Self Care | Payer: Medicare Other | Source: Ambulatory Visit | Attending: Internal Medicine | Admitting: Internal Medicine

## 2014-06-05 ENCOUNTER — Telehealth: Payer: Self-pay | Admitting: Internal Medicine

## 2014-06-05 DIAGNOSIS — M8580 Other specified disorders of bone density and structure, unspecified site: Secondary | ICD-10-CM

## 2014-06-05 NOTE — Telephone Encounter (Signed)
Pt request lab result that was done last week. Please call pt

## 2014-06-06 ENCOUNTER — Inpatient Hospital Stay: Admission: RE | Admit: 2014-06-06 | Payer: Medicare Other | Source: Ambulatory Visit

## 2014-08-14 ENCOUNTER — Ambulatory Visit (INDEPENDENT_AMBULATORY_CARE_PROVIDER_SITE_OTHER): Payer: Medicare Other | Admitting: Internal Medicine

## 2014-08-14 ENCOUNTER — Encounter: Payer: Self-pay | Admitting: Internal Medicine

## 2014-08-14 VITALS — BP 150/90 | HR 67 | Temp 97.8°F | Ht 62.0 in | Wt 156.0 lb

## 2014-08-14 DIAGNOSIS — J310 Chronic rhinitis: Secondary | ICD-10-CM | POA: Diagnosis not present

## 2014-08-14 DIAGNOSIS — J209 Acute bronchitis, unspecified: Secondary | ICD-10-CM

## 2014-08-14 MED ORDER — AZITHROMYCIN 250 MG PO TABS: ORAL_TABLET | ORAL | Status: DC

## 2014-08-14 MED ORDER — BENZONATATE 100 MG PO CAPS: 100.0000 mg | ORAL_CAPSULE | Freq: Three times a day (TID) | ORAL | Status: DC | PRN

## 2014-08-14 NOTE — Progress Notes (Signed)
Pre visit review using our clinic review tool, if applicable. No additional management support is needed unless otherwise documented below in the visit note. 

## 2014-08-14 NOTE — Patient Instructions (Signed)

## 2014-08-14 NOTE — Progress Notes (Signed)
   Subjective:    Patient ID: Sarah Hendricks, female    DOB: [birth year], 69 y.o.   MRN: [id number]  HPI  Her symptoms began 08/09/14 as nonproductive cough and sneezing. This progressed to both chest and head congestion. She is now producing  green/yellow sputum. Nasal secretions are clear. She's had some associated slight shortness of breath. She also has had chills and low-grade fever. She's had minor ear pressure.  She has taken Claritin and Mucinex.  Review of Systems She denies significant frontal headache or facial pain. She has no itchy/watery eyes. The cough is not associated with wheezing.    Objective:   Physical Exam  General appearance:Adequately nourished; no acute distress or increased work of breathing is present.   Appears younger than stated age  Lymphatic: No  lymphadenopathy about the head, neck, or axilla .  Eyes: No conjunctival inflammation or lid edema is present. There is no scleral icterus.  Ears:  External ear exam shows no significant lesions or deformities.  Otoscopic examination reveals clear canals, tympanic membranes are intact bilaterally without bulging, retraction, inflammation or discharge.  Nose:  External nasal examination shows no deformity or inflammation. Marked nasal erythema, especially of the septa. No septal dislocation or deviation.No obstruction to airflow.   Oral exam: Dental hygiene is good; lips and gums are healthy appearing.There is no oropharyngeal erythema or exudate . Hoarse  Neck:  No deformities, thyromegaly, masses, or tenderness noted.   Supple with full range of motion without pain.   Heart:  Normal rate and regular rhythm. S1 and S2 normal without gallop, murmur, click, rub or other extra sounds.   Lungs:Chest clear to auscultation; no wheezes, rhonchi,rales ,or rubs present.  Extremities:  No cyanosis, edema, or clubbing  noted    Skin: Warm & dry w/o tenting or jaundice. No significant lesions or rash.         Assessment & Plan:  #1 acute bronchitis w/o bronchospasm #2 rhinitis Plan: See orders and recommendations

## 2014-08-23 ENCOUNTER — Other Ambulatory Visit: Payer: Self-pay

## 2014-08-23 DIAGNOSIS — Z1231 Encounter for screening mammogram for malignant neoplasm of breast: Secondary | ICD-10-CM

## 2014-09-03 ENCOUNTER — Telehealth: Payer: Self-pay | Admitting: *Deleted

## 2014-09-03 NOTE — Telephone Encounter (Signed)
The phone number that was called by me the answering woman states she is not a pt of [facility].

## 2014-09-03 NOTE — Telephone Encounter (Signed)
Left message informing pt her labs were in.  Dr. Jacqualyn Posey states pt's hepatic function and CBC and Diff were within normal limits and she can start Lamisil.

## 2014-10-04 ENCOUNTER — Ambulatory Visit: Payer: Medicare Other

## 2014-10-10 ENCOUNTER — Ambulatory Visit
Admission: RE | Admit: 2014-10-10 | Discharge: 2014-10-10 | Disposition: A | Discharge status: Home / Self Care | Payer: Medicare Other | Source: Ambulatory Visit

## 2014-10-10 DIAGNOSIS — Z1231 Encounter for screening mammogram for malignant neoplasm of breast: Secondary | ICD-10-CM

## 2015-06-04 ENCOUNTER — Encounter: Payer: Self-pay | Admitting: Internal Medicine

## 2015-06-04 ENCOUNTER — Encounter: Payer: Medicare Other | Admitting: Internal Medicine

## 2015-06-04 ENCOUNTER — Ambulatory Visit (INDEPENDENT_AMBULATORY_CARE_PROVIDER_SITE_OTHER): Payer: Medicare Other | Admitting: Internal Medicine

## 2015-06-04 ENCOUNTER — Other Ambulatory Visit (INDEPENDENT_AMBULATORY_CARE_PROVIDER_SITE_OTHER): Payer: Medicare Other

## 2015-06-04 DIAGNOSIS — Z0000 Encounter for general adult medical examination without abnormal findings: Secondary | ICD-10-CM

## 2015-06-04 DIAGNOSIS — C50912 Malignant neoplasm of unspecified site of left female breast: Secondary | ICD-10-CM | POA: Diagnosis not present

## 2015-06-04 DIAGNOSIS — E785 Hyperlipidemia, unspecified: Secondary | ICD-10-CM

## 2015-06-04 DIAGNOSIS — M8580 Other specified disorders of bone density and structure, unspecified site: Secondary | ICD-10-CM

## 2015-06-04 DIAGNOSIS — I2510 Atherosclerotic heart disease of native coronary artery without angina pectoris: Secondary | ICD-10-CM

## 2015-06-04 DIAGNOSIS — I10 Essential (primary) hypertension: Secondary | ICD-10-CM

## 2015-06-04 DIAGNOSIS — R7989 Other specified abnormal findings of blood chemistry: Secondary | ICD-10-CM | POA: Diagnosis not present

## 2015-06-04 LAB — COMPREHENSIVE METABOLIC PANEL
ALBUMIN: 4.2 g/dL (ref 3.5–5.2)
ALT: 16 U/L (ref 0–35)
AST: 18 U/L (ref 0–37)
Alkaline Phosphatase: 82 U/L (ref 39–117)
BUN: 10 mg/dL (ref 6–23)
CALCIUM: 10 mg/dL (ref 8.4–10.5)
CHLORIDE: 95 meq/L — AB (ref 96–112)
CO2: 29 meq/L (ref 19–32)
CREATININE: 0.89 mg/dL (ref 0.40–1.20)
GFR: 66.98 mL/min (ref >60.00)
Glucose, Bld: 98 mg/dL (ref 70–99)
POTASSIUM: 4 meq/L (ref 3.5–5.1)
Sodium: 132 mEq/L — ABNORMAL LOW (ref 135–145)
Total Bilirubin: 0.7 mg/dL (ref 0.2–1.2)
Total Protein: 7.3 g/dL (ref 6.0–8.3)

## 2015-06-04 LAB — CBC WITH DIFFERENTIAL/PLATELET
BASOS PCT: 0.7 % (ref 0.0–3.0)
Basophils Absolute: 0 10*3/uL (ref 0.0–0.1)
EOS ABS: 0.3 10*3/uL (ref 0.0–0.7)
EOS PCT: 3.8 % (ref 0.0–5.0)
HEMATOCRIT: 42.9 % (ref 36.0–46.0)
HEMOGLOBIN: 14.6 g/dL (ref 12.0–15.0)
LYMPHS PCT: 25.3 % (ref 12.0–46.0)
Lymphs Abs: 1.8 10*3/uL (ref 0.7–4.0)
MCHC: 34 g/dL (ref 30.0–36.0)
MCV: 92.9 fl (ref 78.0–100.0)
MONO ABS: 0.7 10*3/uL (ref 0.1–1.0)
Monocytes Relative: 9.9 % (ref 3.0–12.0)
NEUTROS ABS: 4.3 10*3/uL (ref 1.4–7.7)
Neutrophils Relative %: 60.3 % (ref 43.0–77.0)
PLATELETS: 304 10*3/uL (ref 150.0–400.0)
RBC: 4.62 Mil/uL (ref 3.87–5.11)
RDW: 12.8 % (ref 11.5–15.5)
WBC: 7.1 10*3/uL (ref 4.0–10.5)

## 2015-06-04 LAB — LDL CHOLESTEROL, DIRECT: Direct LDL: 97 mg/dL

## 2015-06-04 LAB — LIPID PANEL
CHOL/HDL RATIO: 4
CHOLESTEROL: 177 mg/dL (ref 0–200)
HDL: 42.2 mg/dL (ref >39.00)
NONHDL: 134.52
TRIGLYCERIDES: 251 mg/dL — AB (ref 0.0–149.0)
VLDL: 50.2 mg/dL — AB (ref 0.0–40.0)

## 2015-06-04 LAB — HEMOGLOBIN A1C: HEMOGLOBIN A1C: 5.6 % (ref 4.6–6.5)

## 2015-06-04 LAB — TSH: TSH: 1.62 u[IU]/mL (ref 0.35–4.50)

## 2015-06-04 MED ORDER — CHOLECALCIFEROL 25 MCG (1000 UT) PO TABS: 1000.0000 [IU] | ORAL_TABLET | Freq: Every day | ORAL | Status: AC

## 2015-06-04 MED ORDER — ATORVASTATIN CALCIUM 10 MG PO TABS: 10.0000 mg | ORAL_TABLET | Freq: Every day | ORAL | Status: DC

## 2015-06-04 MED ORDER — METOPROLOL TARTRATE 25 MG PO TABS: 12.5000 mg | ORAL_TABLET | Freq: Two times a day (BID) | ORAL | Status: DC

## 2015-06-04 NOTE — Assessment & Plan Note (Signed)
On lipitor 10 mg daily Check lipid panel LDL goal less than 70

## 2015-06-04 NOTE — Assessment & Plan Note (Signed)
dexa up to date Exercising - advised increasing her exercise Calcium, vitamin D daily Check vitamin D level

## 2015-06-04 NOTE — Progress Notes (Signed)
Pre visit review using our clinic review tool, if applicable. No additional management support is needed unless otherwise documented below in the visit note. 

## 2015-06-04 NOTE — Patient Instructions (Signed)
We have reviewed your prior records including labs and tests today.  Test(s) ordered today. Your results will be released to [facility] [place] (or called to you) after review, usually within 72hours after test completion. If any changes need to be made, you will be notified at that same time.  All other Health Maintenance issues reviewed.   All recommended immunizations and age-appropriate screenings are up-to-date or discussed.  No immunizations administered today.  An EKG was done today.   Medications reviewed and updated.  No changes recommended at this time.  Your prescription(s) have been submitted to your pharmacy. Please take as directed and contact our office if you believe you are having problem(s) with the medication(s).  Please followup annually, sooner if needed.  Health Maintenance, Female Adopting a healthy lifestyle and getting preventive care can go a long way to promote health and wellness. Talk with your health care provider about what schedule of regular examinations is right for you. This is a good chance for you to check in with your provider about disease prevention and staying healthy. In between checkups, there are plenty of things you can do on your own. Experts have done a lot of research about which lifestyle changes and preventive measures are most likely to keep you healthy. Ask your health care provider for more information. WEIGHT AND DIET  Eat a healthy diet  Be sure to include plenty of vegetables, fruits, low-fat dairy products, and lean protein.  Do not eat a lot of foods high in solid fats, added sugars, or salt.  Get regular exercise. This is one of the most important things you can do for your health.  Most adults should exercise for at least 150 minutes each week. The exercise should increase your heart rate and make you sweat (moderate-intensity exercise).  Most adults should also do strengthening exercises at least twice a week. This is in addition to the  moderate-intensity exercise.  Maintain a healthy weight  Body mass index (BMI) is a measurement that can be used to identify possible weight problems. It estimates body fat based on height and weight. Your health care provider can help determine your BMI and help you achieve or maintain a healthy weight.  For females 101 years of age and older:   A BMI below 18.5 is considered underweight.  A BMI of 18.5 to 24.9 is normal.  A BMI of 25 to 29.9 is considered overweight.  A BMI of 30 and above is considered obese.  Watch levels of cholesterol and blood lipids  You should start having your blood tested for lipids and cholesterol at 69 years of age, then have this test every 5 years.  You may need to have your cholesterol levels checked more often if:  Your lipid or cholesterol levels are high.  You are older than 69 years of age.  You are at high risk for heart disease.  CANCER SCREENING   Lung Cancer  Lung cancer screening is recommended for adults 48-25 years old who are at high risk for lung cancer because of a history of smoking.  A yearly low-dose CT scan of the lungs is recommended for people who:  Currently smoke.  Have quit within the past 15 years.  Have at least a 30-pack-year history of smoking. A pack year is smoking an average of one pack of cigarettes a day for 1 year.  Yearly screening should continue until it has been 15 years since you quit.  Yearly screening should stop if  you develop a health problem that would prevent you from having lung cancer treatment.  Breast Cancer  Practice breast self-awareness. This means understanding how your breasts normally appear and feel.  It also means doing regular breast self-exams. Let your health care provider know about any changes, no matter how small.  If you are in your 20s or 30s, you should have a clinical breast exam (CBE) by a health care provider every 1-3 years as part of a regular health exam.  If  you are 70 or older, have a CBE every year. Also consider having a breast X-ray (mammogram) every year.  If you have a family history of breast cancer, talk to your health care provider about genetic screening.  If you are at high risk for breast cancer, talk to your health care provider about having an MRI and a mammogram every year.  Breast cancer gene (BRCA) assessment is recommended for women who have family members with BRCA-related cancers. BRCA-related cancers include:  Breast.  Ovarian.  Tubal.  Peritoneal cancers.  Results of the assessment will determine the need for genetic counseling and BRCA1 and BRCA2 testing. Cervical Cancer Your health care provider may recommend that you be screened regularly for cancer of the pelvic organs (ovaries, uterus, and vagina). This screening involves a pelvic examination, including checking for microscopic changes to the surface of your cervix (Pap test). You may be encouraged to have this screening done every 3 years, beginning at age 70.  For women ages 15-65, health care providers may recommend pelvic exams and Pap testing every 3 years, or they may recommend the Pap and pelvic exam, combined with testing for human papilloma virus (HPV), every 5 years. Some types of HPV increase your risk of cervical cancer. Testing for HPV may also be done on women of any age with unclear Pap test results.  Other health care providers may not recommend any screening for nonpregnant women who are considered low risk for pelvic cancer and who do not have symptoms. Ask your health care provider if a screening pelvic exam is right for you.  If you have had past treatment for cervical cancer or a condition that could lead to cancer, you need Pap tests and screening for cancer for at least 20 years after your treatment. If Pap tests have been discontinued, your risk factors (such as having a new sexual partner) need to be reassessed to determine if screening should  resume. Some women have medical problems that increase the chance of getting cervical cancer. In these cases, your health care provider may recommend more frequent screening and Pap tests. Colorectal Cancer  This type of cancer can be detected and often prevented.  Routine colorectal cancer screening usually begins at 69 years of age and continues through 69 years of age.  Your health care provider may recommend screening at an earlier age if you have risk factors for colon cancer.  Your health care provider may also recommend using home test kits to check for hidden blood in the stool.  A small camera at the end of a tube can be used to examine your colon directly (sigmoidoscopy or colonoscopy). This is done to check for the earliest forms of colorectal cancer.  Routine screening usually begins at age 68.  Direct examination of the colon should be repeated every 5-10 years through 69 years of age. However, you may need to be screened more often if early forms of precancerous polyps or small growths are found. Skin  Cancer  Check your skin from head to toe regularly.  Tell your health care provider about any new moles or changes in moles, especially if there is a change in a mole's shape or color.  Also tell your health care provider if you have a mole that is larger than the size of a pencil eraser.  Always use sunscreen. Apply sunscreen liberally and repeatedly throughout the day.  Protect yourself by wearing long sleeves, pants, a wide-brimmed hat, and sunglasses whenever you are outside. HEART DISEASE, DIABETES, AND HIGH BLOOD PRESSURE   High blood pressure causes heart disease and increases the risk of stroke. High blood pressure is more likely to develop in:  People who have blood pressure in the high end of the normal range (130-139/85-89 mm Hg).  People who are overweight or obese.  People who are African American.  If you are 41-61 years of age, have your blood pressure  checked every 3-5 years. If you are 75 years of age or older, have your blood pressure checked every year. You should have your blood pressure measured twice--once when you are at a hospital or clinic, and once when you are not at a hospital or clinic. Record the average of the two measurements. To check your blood pressure when you are not at a hospital or clinic, you can use:  An automated blood pressure machine at a pharmacy.  A home blood pressure monitor.  If you are between 71 years and 41 years old, ask your health care provider if you should take aspirin to prevent strokes.  Have regular diabetes screenings. This involves taking a blood sample to check your fasting blood sugar level.  If you are at a normal weight and have a low risk for diabetes, have this test once every three years after 69 years of age.  If you are overweight and have a high risk for diabetes, consider being tested at a younger age or more often. PREVENTING INFECTION  Hepatitis B  If you have a higher risk for hepatitis B, you should be screened for this virus. You are considered at high risk for hepatitis B if:  You were born in a country where hepatitis B is common. Ask your health care provider which countries are considered high risk.  Your parents were born in a high-risk country, and you have not been immunized against hepatitis B (hepatitis B vaccine).  You have HIV or AIDS.  You use needles to inject street drugs.  You live with someone who has hepatitis B.  You have had sex with someone who has hepatitis B.  You get hemodialysis treatment.  You take certain medicines for conditions, including cancer, organ transplantation, and autoimmune conditions. Hepatitis C  Blood testing is recommended for:  Everyone born from [birth year] through 1964.  Anyone with known risk factors for hepatitis C. Sexually transmitted infections (STIs)  You should be screened for sexually transmitted infections (STIs)  including gonorrhea and chlamydia if:  You are sexually active and are younger than 69 years of age.  You are older than 69 years of age and your health care provider tells you that you are at risk for this type of infection.  Your sexual activity has changed since you were last screened and you are at an increased risk for chlamydia or gonorrhea. Ask your health care provider if you are at risk.  If you do not have HIV, but are at risk, it may be recommended that you take a  prescription medicine daily to prevent HIV infection. This is called pre-exposure prophylaxis (PrEP). You are considered at risk if:  You are sexually active and do not regularly use condoms or know the HIV status of your partner(s).  You take drugs by injection.  You are sexually active with a partner who has HIV. Talk with your health care provider about whether you are at high risk of being infected with HIV. If you choose to begin PrEP, you should first be tested for HIV. You should then be tested every 3 months for as long as you are taking PrEP.  PREGNANCY   If you are premenopausal and you may become pregnant, ask your health care provider about preconception counseling.  If you may become pregnant, take 400 to 800 micrograms (mcg) of folic acid every day.  If you want to prevent pregnancy, talk to your health care provider about birth control (contraception). OSTEOPOROSIS AND MENOPAUSE   Osteoporosis is a disease in which the bones lose minerals and strength with aging. This can result in serious bone fractures. Your risk for osteoporosis can be identified using a bone density scan.  If you are 4 years of age or older, or if you are at risk for osteoporosis and fractures, ask your health care provider if you should be screened.  Ask your health care provider whether you should take a calcium or vitamin D supplement to lower your risk for osteoporosis.  Menopause may have certain physical symptoms and  risks.  Hormone replacement therapy may reduce some of these symptoms and risks. Talk to your health care provider about whether hormone replacement therapy is right for you.  HOME CARE INSTRUCTIONS   Schedule regular health, dental, and eye exams.  Stay current with your immunizations.   Do not use any tobacco products including cigarettes, chewing tobacco, or electronic cigarettes.  If you are pregnant, do not drink alcohol.  If you are breastfeeding, limit how much and how often you drink alcohol.  Limit alcohol intake to no more than 1 drink per day for nonpregnant women. One drink equals 12 ounces of beer, 5 ounces of wine, or 1 ounces of hard liquor.  Do not use street drugs.  Do not share needles.  Ask your health care provider for help if you need support or information about quitting drugs.  Tell your health care provider if you often feel depressed.  Tell your health care provider if you have ever been abused or do not feel safe at home.   This information is not intended to replace advice given to you by your health care provider. Make sure you discuss any questions you have with your health care provider.   Document Released: 11/16/2010 Document Revised: 05/24/2014 Document Reviewed: 04/04/2013 Elsevier Interactive Patient Education [employer].

## 2015-06-04 NOTE — Assessment & Plan Note (Signed)
Asymptomatic EKG today Continue asa, lipitor and metoprolol Increase exercise, weight loss

## 2015-06-04 NOTE — Assessment & Plan Note (Addendum)
She checks her BP at home periodically - typically well controlled Elevated here today, will repeat before she leaves Advised her to monitor more regularly at home - if high we need to revise medication Low sodium diet Increase exercise Weight loss Follow up if BP not controlled

## 2015-06-04 NOTE — Assessment & Plan Note (Signed)
mammo up to date No evidence of recurence

## 2015-06-04 NOTE — Progress Notes (Signed)
Subjective:    Patient ID: Sarah Hendricks, female    DOB: 08/25/1945, 70 y.o.   MRN: [id number]  HPI She is here to establish with a new pcp.  She is here for a physical exam.   She has no concerns. She denies changes since she was here last.   Medications and allergies reviewed with patient and updated if appropriate.  Patient Active Problem List   Diagnosis Date Noted  . Osteopenia 05/30/2014  . CAD (coronary artery disease)   . Hypertension   . Hyperlipidemia   . Breast cancer ([place])   . GERD (gastroesophageal reflux disease)   . Vitamin D deficiency     Current Outpatient Prescriptions on File Prior to Visit  Medication Sig Dispense Refill  . aspirin 81 MG tablet Take 81 mg by mouth daily.    Marland Kitchen atorvastatin (LIPITOR) 10 MG tablet Take 1 tablet (10 mg total) by mouth daily. 90 tablet 3  . calcium carbonate (OS-CAL) 600 MG TABS tablet Take 600 mg by mouth 2 (two) times daily with a meal.     . metoprolol tartrate (LOPRESSOR) 25 MG tablet Take 0.5 tablets (12.5 mg total) by mouth 2 (two) times daily. 90 tablet 3  . Omega-3 Fatty Acids (FISH OIL) 1200 MG CAPS Take by mouth daily.     No current facility-administered medications on file prior to visit.    Past Medical History  Diagnosis Date  . GERD (gastroesophageal reflux disease)   . Breast cancer ([place]) 1994    lumpectomy and XRT, no chemo  . Hyperlipidemia   . Hypertension   . History of chicken pox   . CAD (coronary artery disease) 2008    PTCA, in [place]  . Vitamin D deficiency     Past Surgical History  Procedure Laterality Date  . Breast biopsy Left 1994  . Breast lumpectomy Left 1994  . Tonsillectomy  1966  . Heart attack  2008    STENT PLACED  . Craniectomy for depressed skull fracture  1986    Social History   Social History  . Marital Status: Married    Spouse Name: N/A  . Number of Children: N/A  . Years of Education: N/A   Social History Main Topics  . Smoking status: Never Smoker   .  Smokeless tobacco: None  . Alcohol Use: No  . Drug Use: No  . Sexual Activity: Not Asked   Other Topics Concern  . None   Social History Narrative   Married, lives with spouse   Retired from [employer] History  Problem Relation Age of Onset  . Breast cancer Mother   . Hypertension Mother   . Heart disease Mother   . Stroke Mother   . Arthritis Father     Review of Systems  Constitutional: Negative for fever, chills, appetite change, fatigue and unexpected weight change.  HENT: Negative for hearing loss.   Eyes: Negative for visual disturbance.  Respiratory: Negative for cough, shortness of breath and wheezing.   Cardiovascular: Negative for chest pain, palpitations and leg swelling.  Gastrointestinal: Negative for nausea, abdominal pain, diarrhea, constipation and blood in stool.       No GERD  Genitourinary: Negative for dysuria and hematuria.  Musculoskeletal: Negative for myalgias, back pain and arthralgias.  Skin: Negative for rash.  Neurological: Negative for dizziness, light-headedness and headaches.  Psychiatric/Behavioral: Negative for sleep disturbance and dysphoric mood. The patient is not nervous/anxious.  Objective:   Filed Vitals:   06/04/15 0801  BP: 150/84  Pulse: 62  Temp: 98.5 F (36.9 C)  Resp: 16   Filed Weights   06/04/15 0801  Weight: 155 lb (70.308 kg)   Body mass index is 28.34 kg/(m^2).   Physical Exam Constitutional: She appears well-developed and well-nourished. No distress.  HENT:  Head: Normocephalic and atraumatic.  Right Ear: External ear normal. Normal ear canal and TM Left Ear: External ear normal.  Normal ear canal and TM Mouth/Throat: Oropharynx is clear and moist.  Normal bilateral ear canals and tympanic membranes  Eyes: Conjunctivae and EOM are normal.  Neck: Neck supple. No tracheal deviation present. No thyromegaly present.  No carotid bruit  Cardiovascular: Normal rate, regular rhythm and normal  heart sounds.   No murmur heard.  No edema. Pulmonary/Chest: Effort normal and breath sounds normal. No respiratory distress. She has no wheezes. She has no rales.  Breast: deferred by patient Abdominal: Soft. She exhibits no distension. There is no tenderness.  Lymphadenopathy: She has no cervical adenopathy.  Skin: Skin is warm and dry. She is not diaphoretic.  Psychiatric: She has a normal mood and affect. Her behavior is normal.      Assessment & Plan:   Physical exam: Screening blood work ordered Immunizations discussed - deferred shingles, tdap Colonoscopy  up to date Gyn - no regular follow up Dexa up to date Eye exams up to date EKG today  - shows old infarct Exercise - stressed regular exercise Weight - advised weight loss - she is working on it Skin - no concerns Substance abuse - none  See Problem List for Assessment and Plan of chronic medical problems.  Follow up annually, sooner if BP is not controlled

## 2015-06-08 LAB — VITAMIN D 1,25 DIHYDROXY
VITAMIN D 1, 25 (OH) TOTAL: 41 pg/mL (ref 18–72)
VITAMIN D3 1, 25 (OH): 41 pg/mL

## 2015-06-12 ENCOUNTER — Other Ambulatory Visit: Payer: Self-pay | Admitting: Internal Medicine

## 2015-06-16 ENCOUNTER — Telehealth: Payer: Self-pay | Admitting: Internal Medicine

## 2015-06-16 NOTE — Telephone Encounter (Signed)
Please advise 

## 2015-06-16 NOTE — Telephone Encounter (Signed)
Patient called to advise that she got sick after coming in to our office the other day. Sx of sore throat and cough. Her question is can we call something in for her, given the circumstances?

## 2015-06-16 NOTE — Telephone Encounter (Signed)
Get more info and specifics about her symptoms.  There is a good chance it is viral and an antibiotic will not help.

## 2015-06-17 NOTE — Telephone Encounter (Signed)
Without seeing her it is hard to tell if it is viral or bacterial.  It sounds viral to me and I would recommend OTC cold medications.   General Recommendations:    Please drink plenty of fluids.  Get plenty of rest   Sleep in humidified air  Use saline nasal sprays  Netti pot  OTC Medications:  Flonase (generic fluticasone) or Nasacort (generic triamcinolone) Claritin (generic loratidine), Allegra (fexofenidine), or Zyrtec (generic cyrterizine) for runny nose.  Delsym or Robitussin (generic dextromethorphan) Mucinex (generic guaifenesin) as directed on the package. Tylenol (generic acetaminophen) - DO NOT EXCEED 3 grams (3,000 mg) in a 24 hour time period Advil/Motrin (generic ibuprofen) Salt water gargle  Chloraseptic (generic benzocaine) spray or lozenges / Sucrets (generic dyclonine)

## 2015-06-17 NOTE — Telephone Encounter (Signed)
Spoke with pt to inform of MDs response.  

## 2015-06-17 NOTE — Telephone Encounter (Signed)
Pt called back and she states that this started last Friday. She felt fine in the morning then got worse as the day progressed.  She says both ears are bothering her, sore throat, headache for a week, upset stomach, chills but no fever.

## 2015-06-17 NOTE — Telephone Encounter (Signed)
Please advise 

## 2015-07-16 ENCOUNTER — Other Ambulatory Visit: Payer: Self-pay | Admitting: Internal Medicine

## 2015-08-13 ENCOUNTER — Telehealth: Payer: Self-pay | Admitting: *Deleted

## 2015-08-13 MED ORDER — ATORVASTATIN CALCIUM 20 MG PO TABS: 20.0000 mg | ORAL_TABLET | Freq: Every day | ORAL | Status: DC

## 2015-08-13 NOTE — Telephone Encounter (Signed)
Received call pt states MD increase lipitor to 20 mg. Needing updated script sent to [facility]. Inform pt will send electronically...Sarah Hendricks

## 2015-09-05 ENCOUNTER — Other Ambulatory Visit: Payer: Self-pay

## 2015-09-05 DIAGNOSIS — Z1231 Encounter for screening mammogram for malignant neoplasm of breast: Secondary | ICD-10-CM

## 2015-10-14 ENCOUNTER — Ambulatory Visit
Admission: RE | Admit: 2015-10-14 | Discharge: 2015-10-14 | Disposition: A | Discharge status: Home / Self Care | Payer: Medicare Other | Source: Ambulatory Visit

## 2015-10-14 DIAGNOSIS — Z1231 Encounter for screening mammogram for malignant neoplasm of breast: Secondary | ICD-10-CM | POA: Diagnosis not present

## 2015-10-20 ENCOUNTER — Ambulatory Visit (INDEPENDENT_AMBULATORY_CARE_PROVIDER_SITE_OTHER): Payer: Medicare Other | Admitting: Internal Medicine

## 2015-10-20 ENCOUNTER — Encounter: Payer: Self-pay | Admitting: Internal Medicine

## 2015-10-20 ENCOUNTER — Ambulatory Visit (INDEPENDENT_AMBULATORY_CARE_PROVIDER_SITE_OTHER)
Admission: RE | Admit: 2015-10-20 | Discharge: 2015-10-20 | Disposition: A | Discharge status: Home / Self Care | Payer: Medicare Other | Source: Ambulatory Visit | Attending: Internal Medicine | Admitting: Internal Medicine

## 2015-10-20 VITALS — BP 220/114 | HR 66 | Temp 98.3°F | Resp 16 | Wt 154.0 lb

## 2015-10-20 DIAGNOSIS — M79673 Pain in unspecified foot: Secondary | ICD-10-CM | POA: Insufficient documentation

## 2015-10-20 DIAGNOSIS — M79671 Pain in right foot: Secondary | ICD-10-CM | POA: Diagnosis not present

## 2015-10-20 DIAGNOSIS — I10 Essential (primary) hypertension: Secondary | ICD-10-CM | POA: Diagnosis not present

## 2015-10-20 DIAGNOSIS — I2510 Atherosclerotic heart disease of native coronary artery without angina pectoris: Secondary | ICD-10-CM

## 2015-10-20 DIAGNOSIS — M2011 Hallux valgus (acquired), right foot: Secondary | ICD-10-CM | POA: Diagnosis not present

## 2015-10-20 NOTE — Assessment & Plan Note (Signed)
BP very elevated here today, but seems to be well controlled at home - she will continue to monitor it at home regularly Continue current dose of metoprolol for now - will adjust if needed

## 2015-10-20 NOTE — Assessment & Plan Note (Signed)
Asymptomatic Will refer to cardio to establish - has not seen cardio since moved to the area Continue ASA, statin Start regular exercise when foot is better

## 2015-10-20 NOTE — Progress Notes (Signed)
Pre visit review using our clinic review tool, if applicable. No additional management support is needed unless otherwise documented below in the visit note. 

## 2015-10-20 NOTE — Assessment & Plan Note (Signed)
Likely plantar fasciitis Will check xray today Discussed stretching, ice twice a day, advil, good footwear, avoiding bare feet Will refer to podiatry/ ortho if no improvement

## 2015-10-20 NOTE — Progress Notes (Signed)
Subjective:    Patient ID: Sarah Hendricks, female    DOB: 01-Nov-1945, 70 y.o.   MRN: [id number]  HPI She is here for follow up.  CAD, Hypertension: She is taking her medication daily. She is compliant with a low sodium diet.  She denies chest pain, palpitations, edema, shortness of breath and regular headaches. She is not exercising regularly due to foot pain.  She does monitor her blood pressure at home - 154/72 today, 132/68 yesterday -- on average 135-140/68-72.    Right foot pain:  It started about two months ago. She has pain in her heel and radiates up the achilles.  The heel does swell.  She has pain when she walks.  She has been icing the foot and taking advil and it helps.    Medications and allergies reviewed with patient and updated if appropriate.  Patient Active Problem List   Diagnosis Date Noted  . Osteopenia 05/30/2014  . CAD (coronary artery disease)   . Hypertension   . Hyperlipidemia   . Breast cancer ([place])   . Vitamin D deficiency     Current Outpatient Prescriptions on File Prior to Visit  Medication Sig Dispense Refill  . aspirin 81 MG tablet Take 81 mg by mouth daily.    Marland Kitchen atorvastatin (LIPITOR) 20 MG tablet Take 1 tablet (20 mg total) by mouth daily. 30 tablet 5  . calcium carbonate (OS-CAL) 600 MG TABS tablet Take 600 mg by mouth 2 (two) times daily with a meal.     . Cholecalciferol (EQL VITAMIN D3) 1000 units tablet Take 1 tablet (1,000 Units total) by mouth daily.    . metoprolol tartrate (LOPRESSOR) 25 MG tablet Take 0.5 tablets (12.5 mg total) by mouth 2 (two) times daily. 90 tablet 3  . Omega-3 Fatty Acids (FISH OIL) 1200 MG CAPS Take by mouth daily.     No current facility-administered medications on file prior to visit.    Past Medical History  Diagnosis Date  . GERD (gastroesophageal reflux disease)   . Breast cancer ([place]) 1994    lumpectomy and XRT, no chemo  . Hyperlipidemia   . Hypertension   . History of chicken pox   . CAD  (coronary artery disease) 2008    PTCA, in [place]  . Vitamin D deficiency     Past Surgical History  Procedure Laterality Date  . Breast biopsy Left 1994  . Breast lumpectomy Left 1994  . Tonsillectomy  1966  . Heart attack  2008    STENT PLACED  . Craniectomy for depressed skull fracture  1986    Social History   Social History  . Marital Status: Married    Spouse Name: N/A  . Number of Children: N/A  . Years of Education: N/A   Social History Main Topics  . Smoking status: Never Smoker   . Smokeless tobacco: Never Used  . Alcohol Use: No  . Drug Use: No  . Sexual Activity: Not Asked   Other Topics Concern  . None   Social History Narrative   Married, lives with spouse   Retired from [employer] regularly, rides an exercise bike    Family History  Problem Relation Age of Onset  . Breast cancer Mother   . Hypertension Mother   . Heart disease Mother   . Stroke Mother   . Arthritis Father   . Heart disease Brother   . Stroke Maternal Grandmother   . Heart  disease Maternal Grandfather     Review of Systems  Constitutional: Negative for fever and chills.  Respiratory: Negative for cough, shortness of breath and wheezing.   Cardiovascular: Positive for leg swelling (mild). Negative for chest pain and palpitations.  Neurological: Negative for dizziness, light-headedness and headaches.       Objective:   Filed Vitals:   10/20/15 0938 10/20/15 0959  BP: 200/118 220/114  Pulse: 66   Temp: 98.3 F (36.8 C)   Resp: 16    Filed Weights   10/20/15 0938  Weight: 154 lb (69.854 kg)  BP 200/105  Body mass index is 28.16 kg/(m^2).   Physical Exam Constitutional: Appears well-developed and well-nourished. No distress.  Neck: Neck supple. No tracheal deviation present. No thyromegaly present.  No carotid bruit. No cervical adenopathy.   Cardiovascular: Normal rate, regular rhythm and normal heart sounds.   No murmur heard.  No  edema Pulmonary/Chest: Effort normal and breath sounds normal. No respiratory distress. No wheezes.  Foot: right heel pain with palpation of sides of heel and at insertion site of plantar fascia, no achilles tendon pain     Assessment & Plan:    See Problem List for Assessment and Plan of chronic medical problems.

## 2015-10-20 NOTE — Patient Instructions (Addendum)
A referral was ordered for cardiology.    Continue to monitor your blood pressure.    Medications reviewed and updated.  No changes recommended at this time.   Please followup in 6 months  Plantar Fasciitis Plantar fasciitis is a painful foot condition that affects the heel. It occurs when the band of tissue that connects the toes to the heel bone (plantar fascia) becomes irritated. This can happen after exercising too much or doing other repetitive activities (overuse injury). The pain from plantar fasciitis can range from mild irritation to severe pain that makes it difficult for you to walk or move. The pain is usually worse in the morning or after you have been sitting or lying down for a while. CAUSES This condition may be caused by:  Standing for long periods of time.  Wearing shoes that do not fit.  Doing high-impact activities, including running, aerobics, and ballet.  Being overweight.  Having an abnormal way of walking (gait).  Having tight calf muscles.  Having high arches in your feet.  Starting a new athletic activity. SYMPTOMS The main symptom of this condition is heel pain. Other symptoms include:  Pain that gets worse after activity or exercise.  Pain that is worse in the morning or after resting.  Pain that goes away after you walk for a few minutes. DIAGNOSIS This condition may be diagnosed based on your signs and symptoms. Your health care provider will also do a physical exam to check for:  A tender area on the bottom of your foot.  A high arch in your foot.  Pain when you move your foot.  Difficulty moving your foot. You may also need to have imaging studies to confirm the diagnosis. These can include:  X-rays.  Ultrasound.  MRI. TREATMENT  Treatment for plantar fasciitis depends on the severity of the condition. Your treatment may include:  Rest, ice, and over-the-counter pain medicines to manage your pain.  Exercises to stretch your  calves and your plantar fascia.  A splint that holds your foot in a stretched, upward position while you sleep (night splint).  Physical therapy to relieve symptoms and prevent problems in the future.  Cortisone injections to relieve severe pain.  Extracorporeal shock wave therapy (ESWT) to stimulate damaged plantar fascia with electrical impulses. It is often used as a last resort before surgery.  Surgery, if other treatments have not worked after 12 months. HOME CARE INSTRUCTIONS  Take medicines only as directed by your health care provider.  Avoid activities that cause pain.  Roll the bottom of your foot over a bag of ice or a bottle of cold water. Do this for 20 minutes, 3-4 times a day.  Perform simple stretches as directed by your health care provider.  Try wearing athletic shoes with air-sole or gel-sole cushions or soft shoe inserts.  Wear a night splint while sleeping, if directed by your health care provider.  Keep all follow-up appointments with your health care provider. PREVENTION   Do not perform exercises or activities that cause heel pain.  Consider finding low-impact activities if you continue to have problems.  Lose weight if you need to. The best way to prevent plantar fasciitis is to avoid the activities that aggravate your plantar fascia. SEEK MEDICAL CARE IF:  Your symptoms do not go away after treatment with home care measures.  Your pain gets worse.  Your pain affects your ability to move or do your daily activities.   This information is not  intended to replace advice given to you by your health care provider. Make sure you discuss any questions you have with your health care provider.   Document Released: 01/26/2001 Document Revised: 01/22/2015 Document Reviewed: 03/13/2014 Elsevier Interactive Patient Education [employer].

## 2015-10-21 ENCOUNTER — Telehealth: Payer: Self-pay | Admitting: Emergency Medicine

## 2015-10-21 NOTE — Telephone Encounter (Signed)
-----   Message from Binnie Rail, MD sent at 10/20/2015  9:12 PM EDT ----- Foot xray shows no fracture

## 2015-10-21 NOTE — Telephone Encounter (Signed)
Spoke to pt to give results. Pt states she took her BP today and it was 148/76. Advised pt to take daily, different times of the day. And to call if it continued to run high.

## 2015-10-28 ENCOUNTER — Encounter: Payer: Self-pay | Admitting: Cardiology

## 2015-10-28 ENCOUNTER — Ambulatory Visit (INDEPENDENT_AMBULATORY_CARE_PROVIDER_SITE_OTHER): Payer: Medicare Other | Admitting: Cardiology

## 2015-10-28 VITALS — BP 164/92 | HR 75 | Ht 62.25 in | Wt 154.1 lb

## 2015-10-28 DIAGNOSIS — I10 Essential (primary) hypertension: Secondary | ICD-10-CM | POA: Diagnosis not present

## 2015-10-28 MED ORDER — CARVEDILOL 6.25 MG PO TABS: 6.2500 mg | ORAL_TABLET | Freq: Two times a day (BID) | ORAL | Status: DC

## 2015-10-28 NOTE — Patient Instructions (Signed)
Medication Instructions:  Your physician has recommended you make the following change in your medication:  1) STOP Metoprolol 2) START Carvedilol 6.25 mg twice daily  Labwork: None ordered  Testing/Procedures: None ordered  Follow-Up: Your physician wants you to follow-up in: 6 months with Dr. Curt Bears. You will receive a reminder letter in the mail two months in advance. If you don't receive a letter, please call our office to schedule the follow-up appointment.  If you need a refill on your cardiac medications before your next appointment, please call your pharmacy.  Thank you for choosing [facility]!!   Trinidad Curet, RN [phone]  Any Other Special Instructions Will Be Listed Below (If Applicable). Carvedilol tablets What is this medicine? CARVEDILOL (KAR ve dil ol) is a beta-blocker. Beta-blockers reduce the workload on the heart and help it to beat more regularly. This medicine is used to treat high blood pressure and heart failure. This medicine may be used for other purposes; ask your health care provider or pharmacist if you have questions. What should I tell my health care provider before I take this medicine? They need to know if you have any of these conditions: -circulation problems -diabetes -history of heart attack or heart disease -liver disease -lung or breathing disease, like asthma or emphysema -pheochromocytoma -slow or irregular heartbeat -thyroid disease -an unusual or allergic reaction to carvedilol, other beta-blockers, medicines, foods, dyes, or preservatives -pregnant or trying to get pregnant -breast-feeding How should I use this medicine? Take this medicine by mouth with a glass of water. Follow the directions on the prescription label. It is best to take the tablets with food. Take your doses at regular intervals. Do not take your medicine more often than directed. Do not stop taking except on the advice of your doctor or health care  professional. Talk to your pediatrician regarding the use of this medicine in children. Special care may be needed. Overdosage: If you think you have taken too much of this medicine contact a poison control center or emergency room at once. NOTE: This medicine is only for you. Do not share this medicine with others. What if I miss a dose? If you miss a dose, take it as soon as you can. If it is almost time for your next dose, take only that dose. Do not take double or extra doses. What may interact with this medicine? This medicine may interact with the following medications: -certain medicines for blood pressure, heart disease, irregular heart beat -certain medicines for depression, like fluoxetine or paroxetine -certain medicines for diabetes, like glipizide or glyburide -cimetidine -clonidine -cyclosporine -digoxin -MAOIs like Carbex, Eldepryl, Marplan, Nardil, and Parnate -reserpine -rifampin This list may not describe all possible interactions. Give your health care provider a list of all the medicines, herbs, non-prescription drugs, or dietary supplements you use. Also tell them if you smoke, drink alcohol, or use illegal drugs. Some items may interact with your medicine. What should I watch for while using this medicine? Check your heart rate and blood pressure regularly while you are taking this medicine. Ask your doctor or health care professional what your heart rate and blood pressure should be, and when you should contact him or her. Do not stop taking this medicine suddenly. This could lead to serious heart-related effects. Contact your doctor or health care professional if you have difficulty breathing while taking this drug. Check your weight daily. Ask your doctor or health care professional when you should notify him/her of any weight  gain. You may get drowsy or dizzy. Do not drive, use machinery, or do anything that requires mental alertness until you know how this medicine  affects you. To reduce the risk of dizzy or fainting spells, do not sit or stand up quickly. Alcohol can make you more drowsy, and increase flushing and rapid heartbeats. Avoid alcoholic drinks. If you have diabetes, check your blood sugar as directed. Tell your doctor if you have changes in your blood sugar while you are taking this medicine. If you are going to have surgery, tell your doctor or health care professional that you are taking this medicine. What side effects may I notice from receiving this medicine? Side effects that you should report to your doctor or health care professional as soon as possible: -allergic reactions like skin rash, itching or hives, swelling of the face, lips, or tongue -breathing problems -dark urine -irregular heartbeat -swollen legs or ankles -vomiting -yellowing of the eyes or skin Side effects that usually do not require medical attention (report to your doctor or health care professional if they continue or are bothersome): -change in sex drive or performance -diarrhea -dry eyes (especially if wearing contact lenses) -dry, itching skin -headache -nausea -unusually tired This list may not describe all possible side effects. Call your doctor for medical advice about side effects. You may report side effects to FDA at 1-800-FDA-1088. Where should I keep my medicine? Keep out of the reach of children. Store at room temperature below 30 degrees C (86 degrees F). Protect from moisture. Keep container tightly closed. Throw away any unused medicine after the expiration date. NOTE: This sheet is a summary. It may not cover all possible information. If you have questions about this medicine, talk to your doctor, pharmacist, or health care provider.    2016, Elsevier/Gold Standard. (2013-01-07 14:12:02)

## 2015-10-28 NOTE — Progress Notes (Signed)
Cardiology Office Note   Date:  10/28/2015   ID:  Sarah Hendricks, DOB 1945/11/08, MRN [id number]  PCP:  Binnie Rail, MD  Cardiologist:   Constance Haw, MD    Chief Complaint  Patient presents with  . Advice Only     History of Present Illness: Sarah Hendricks is a 70 y.o. female who presents today for cardiology evaluation.   She has a history of hypertension, hyperlipidemia, CAD with stent placement in 2008, and breast cancer status post lumpectomy and XRT but no chemotherapy.   Today, she denies symptoms of palpitations, chest pain, shortness of breath, orthopnea, PND, lower extremity edema, claudication, dizziness, presyncope, syncope, bleeding, or neurologic sequela. The patient is tolerating medications without difficulties and is otherwise without complaint today.  She was seen by her primary physician on 6/5 and was noted to be very hypertensive at 220/114. She says that she was feeling anxious at the time, and in the process of changing physicians, has had high blood pressure at the physician's office. Her husband recently moved from [place] where they worked in the hospital. She has been taking her blood pressures at home and they have ranged from the low 150s down to the 120s. She says that she does not have headaches, shortness of breath, visual changes.   Past Medical History  Diagnosis Date  . GERD (gastroesophageal reflux disease)   . Breast cancer ([place]) 1994    lumpectomy and XRT, no chemo  . Hyperlipidemia   . Hypertension   . History of chicken pox   . CAD (coronary artery disease) 2008    PTCA, in [place]  . Vitamin D deficiency    Past Surgical History  Procedure Laterality Date  . Breast biopsy Left 1994  . Breast lumpectomy Left 1994  . Tonsillectomy  1966  . Heart attack  2008    STENT PLACED  . Craniectomy for depressed skull fracture  1986     Current Outpatient Prescriptions  Medication Sig Dispense Refill  . aspirin 81 MG tablet Take  81 mg by mouth daily.    Marland Kitchen atorvastatin (LIPITOR) 20 MG tablet Take 1 tablet (20 mg total) by mouth daily. 30 tablet 5  . calcium carbonate (OS-CAL) 600 MG TABS tablet Take 600 mg by mouth 2 (two) times daily with a meal.     . Cholecalciferol (EQL VITAMIN D3) 1000 units tablet Take 1 tablet (1,000 Units total) by mouth daily.    . metoprolol tartrate (LOPRESSOR) 25 MG tablet Take 0.5 tablets (12.5 mg total) by mouth 2 (two) times daily. 90 tablet 3  . Omega-3 Fatty Acids (FISH OIL) 1200 MG CAPS Take by mouth daily.     No current facility-administered medications for this visit.    Allergies:   Review of patient's allergies indicates no known allergies.   Social History:  The patient  reports that she has never smoked. She has never used smokeless tobacco. She reports that she does not drink alcohol or use illicit drugs.   Family History:  The patient's family history includes Arthritis in her father; Breast cancer in her mother; Heart disease in her brother, maternal grandfather, and mother; Hypertension in her mother; Stroke in her maternal grandmother and mother.    ROS:  Please see the history of present illness.   Otherwise, review of systems is positive for none.   All other systems are reviewed and negative.    PHYSICAL EXAM: VS:  BP 164/92 mmHg  Pulse 75  Ht 5' 2.25" (1.581 m)  Wt 154 lb 1.9 oz (69.908 kg)  BMI 27.97 kg/m2  SpO2 95% , BMI Body mass index is 27.97 kg/(m^2). GEN: Well nourished, well developed, in no acute distress HEENT: normal Neck: no JVD, carotid bruits, or masses Cardiac: RRR; no murmurs, rubs, or gallops,no edema  Respiratory:  clear to auscultation bilaterally, normal work of breathing GI: soft, nontender, nondistended, + BS MS: no deformity or atrophy Skin: warm and dry Neuro:  Strength and sensation are intact Psych: euthymic mood, full affect  EKG:  EKG is not ordered today.   Recent Labs: 06/04/2015: ALT 16; BUN 10; Creatinine, Ser 0.89;  Hemoglobin 14.6; Platelets 304.0; Potassium 4.0; Sodium 132*; TSH 1.62    Lipid Panel     Component Value Date/Time   CHOL 177 06/04/2015 0928   TRIG 251.0* 06/04/2015 0928   HDL 42.20 06/04/2015 0928   CHOLHDL 4 06/04/2015 0928   VLDL 50.2* 06/04/2015 0928   LDLCALC 88 09/19/2013 1037   LDLDIRECT 97.0 06/04/2015 0928     Wt Readings from Last 3 Encounters:  10/28/15 154 lb 1.9 oz (69.908 kg)  10/20/15 154 lb (69.854 kg)  06/04/15 155 lb (70.308 kg)      Other studies Reviewed: Additional studies/ records that were reviewed today include: Epic notes   ASSESSMENT AND PLAN:  1.  Coronary artery disease: Currently taking aspirin and metoprolol. No current symptoms. That stent was placed in 2008. Working to get records from her cardiologist in [place].  2. Hypertension: On metoprolol 12.5 mg twice daily. Her blood pressure is elevated today in clinic, but has been fairly normal at home. Due to her hypertension and clinics, we'll switch her metoprolol to carvedilol 6.125 twice daily. I have instructed her to check her blood pressure at home and to call with elevated results.  3.  Hyperlipidemia: On atorvastatin.    Current medicines are reviewed at length with the patient today.   The patient does not have concerns regarding her medicines.  The following changes were made today:  Change metoprolol to coreg 6.125 mg  Labs/ tests ordered today include:  No orders of the defined types were placed in this encounter.     Disposition:   FU with Lindi Abram 3 months  Signed, Kendall Justo Meredith Leeds, MD  10/28/2015 1:59 PM     [address] [place] [address] [phone] (office) [phone] (fax)

## 2015-12-04 ENCOUNTER — Telehealth: Payer: Self-pay | Admitting: Cardiology

## 2015-12-04 NOTE — Telephone Encounter (Signed)
Records received from [facility] gave to [facility]

## 2016-02-23 ENCOUNTER — Other Ambulatory Visit: Payer: Self-pay | Admitting: Internal Medicine

## 2016-04-05 ENCOUNTER — Ambulatory Visit: Payer: Medicare Other | Admitting: Internal Medicine

## 2016-04-06 ENCOUNTER — Ambulatory Visit (INDEPENDENT_AMBULATORY_CARE_PROVIDER_SITE_OTHER): Payer: Medicare Other | Admitting: Internal Medicine

## 2016-04-06 ENCOUNTER — Encounter: Payer: Self-pay | Admitting: Internal Medicine

## 2016-04-06 DIAGNOSIS — I10 Essential (primary) hypertension: Secondary | ICD-10-CM

## 2016-04-06 DIAGNOSIS — J209 Acute bronchitis, unspecified: Secondary | ICD-10-CM

## 2016-04-06 MED ORDER — BENZONATATE 100 MG PO CAPS: 100.0000 mg | ORAL_CAPSULE | Freq: Three times a day (TID) | ORAL | 0 refills | Status: DC | PRN

## 2016-04-06 MED ORDER — DOXYCYCLINE HYCLATE 100 MG PO CAPS: 100.0000 mg | ORAL_CAPSULE | Freq: Two times a day (BID) | ORAL | 0 refills | Status: DC

## 2016-04-06 NOTE — Progress Notes (Signed)
Pre visit review using our clinic review tool, if applicable. No additional management support is needed unless otherwise documented below in the visit note. 

## 2016-04-06 NOTE — Assessment & Plan Note (Signed)
BP up today likely due to otc cold medications and some white coat hypertension.

## 2016-04-06 NOTE — Assessment & Plan Note (Signed)
Rx for doxycycline and tessalon perles. Treating earlier than usual for bronchitis due to the wheezing on exam.

## 2016-04-06 NOTE — Progress Notes (Signed)
   Subjective:    Patient ID: Sarah Hendricks, female    DOB: 04-08-1946, 70 y.o.   MRN: [id number]  HPI The patient is a 70 YO female coming in for cough and chest congestion. Going on for 5 days. Tried taking some OTC medications (mucinex) without relief. Overall symptoms are worsening since onset. Having SOB and sputum production. Some chills but no fevers. Not much nasal drainage. No ear pain.  Review of Systems  Constitutional: Positive for activity change, appetite change and chills. Negative for fatigue, fever and unexpected weight change.  HENT: Positive for congestion and sore throat. Negative for ear discharge, ear pain, postnasal drip, rhinorrhea, sinus pain, sinus pressure and trouble swallowing.   Eyes: Negative.   Respiratory: Positive for cough, shortness of breath and wheezing. Negative for apnea and chest tightness.   Cardiovascular: Negative.   Gastrointestinal: Negative.   Musculoskeletal: Negative.       Objective:   Physical Exam  Constitutional: She is oriented to person, place, and time. She appears well-developed and well-nourished.  HENT:  Head: Normocephalic and atraumatic.  Right Ear: External ear normal.  Left Ear: External ear normal.  Oropharynx with redness and clear drainage  Eyes: EOM are normal.  Neck: Normal range of motion.  Cardiovascular: Normal rate and regular rhythm.   Pulmonary/Chest: Effort normal. No respiratory distress. She has wheezes. She has no rales. She exhibits no tenderness.  Some wheezing which partially clears with cough  Abdominal: Soft.  Neurological: She is alert and oriented to person, place, and time.  Skin: Skin is warm and dry.   Vitals:   04/06/16 0810  BP: (!) 198/110  Pulse: 67  Resp: 16  Temp: 98.1 F (36.7 C)  TempSrc: Oral  SpO2: 96%  Weight: 153 lb 12.8 oz (69.8 kg)  Height: 5' 2.5" (1.588 m)      Assessment & Plan:

## 2016-04-06 NOTE — Patient Instructions (Signed)
We have sent in the antibiotic today called doxycycline. Take 1 pill twice a day for 1 week.  We have also sent in the cough medicine called tessalon perles that you can take up to 3 times a day which are non-drowsy that help you to cough less.

## 2016-04-17 ENCOUNTER — Other Ambulatory Visit: Payer: Self-pay | Admitting: Cardiology

## 2016-04-19 ENCOUNTER — Other Ambulatory Visit: Payer: Self-pay | Admitting: Cardiology

## 2016-04-28 ENCOUNTER — Ambulatory Visit (INDEPENDENT_AMBULATORY_CARE_PROVIDER_SITE_OTHER): Payer: Medicare Other | Admitting: Cardiology

## 2016-04-28 VITALS — BP 146/90 | HR 63 | Ht 62.5 in | Wt 153.0 lb

## 2016-04-28 DIAGNOSIS — I10 Essential (primary) hypertension: Secondary | ICD-10-CM | POA: Diagnosis not present

## 2016-04-28 DIAGNOSIS — R079 Chest pain, unspecified: Secondary | ICD-10-CM

## 2016-04-28 DIAGNOSIS — E785 Hyperlipidemia, unspecified: Secondary | ICD-10-CM | POA: Diagnosis not present

## 2016-04-28 DIAGNOSIS — I2510 Atherosclerotic heart disease of native coronary artery without angina pectoris: Secondary | ICD-10-CM | POA: Diagnosis not present

## 2016-04-28 MED ORDER — SPIRONOLACTONE 25 MG PO TABS
25.0000 mg | ORAL_TABLET | Freq: Every day | ORAL | 3 refills | Status: DC
Start: 2016-04-28 — End: 2016-04-28

## 2016-04-28 MED ORDER — TORSEMIDE 20 MG PO TABS: 20.0000 mg | ORAL_TABLET | Freq: Two times a day (BID) | ORAL | 3 refills | Status: DC

## 2016-04-28 MED ORDER — SPIRONOLACTONE 25 MG PO TABS: 25.0000 mg | ORAL_TABLET | Freq: Every day | ORAL | 3 refills | Status: DC

## 2016-04-28 NOTE — Progress Notes (Signed)
Cardiology Office Note   Date:  04/30/2016   ID:  Sarah Hendricks, DOB 10-31-1945, MRN [id number]  PCP:  Binnie Rail, MD  Cardiologist:   Ena Dawley, MD    Chief complain: 6 months follow up   History of Present Illness: Sarah Hendricks is a 70 y.o. female with history of hypertension, hyperlipidemia, CAD with stent placement in 2008, and breast cancer status post lumpectomy and XRT but no chemotherapy. She has been doing well, she and her husband retired Sarah Hendricks are moderately active and she denies any chest pain or SOB with the activities.  She states that her BP is elevated only at the doctors offices. However at the recent doctors visit her BP was 198/102, on another visit it was 220/114. She denies symptoms of palpitations, orthopnea, PND, lower extremity edema, claudication, dizziness, presyncope, syncope, bleeding, or neurologic sequela. The patient is tolerating medications without difficulties and. Her husband recently moved from [place] where they worked in the hospital. She says that she does not have headaches, shortness of breath, visual changes.   Past Medical History:  Diagnosis Date  . Breast cancer ([place]) 1994   lumpectomy and XRT, no chemo  . CAD (coronary artery disease) 2008   PTCA, in [place]  . GERD (gastroesophageal reflux disease)   . History of chicken pox   . Hyperlipidemia   . Hypertension   . Vitamin D deficiency    Past Surgical History:  Procedure Laterality Date  . BREAST BIOPSY Left 1994  . BREAST LUMPECTOMY Left 1994  . CRANIECTOMY FOR DEPRESSED SKULL FRACTURE  1986  . HEART ATTACK  2008   STENT PLACED  . TONSILLECTOMY  1966     Current Outpatient Prescriptions  Medication Sig Dispense Refill  . aspirin 81 MG tablet Take 81 mg by mouth daily.    Marland Kitchen atorvastatin (LIPITOR) 20 MG tablet TAKE 1 TABLET(20 MG) BY MOUTH DAILY 30 tablet 5  . benzonatate (TESSALON) 100 MG capsule Take 1 capsule (100 mg total) by mouth 3 (three) times  daily as needed for cough. 60 capsule 0  . calcium carbonate (OS-CAL) 600 MG TABS tablet Take 600 mg by mouth 2 (two) times daily with a meal.     . carvedilol (COREG) 6.25 MG tablet TAKE 1 TABLET BY MOUTH TWICE DAILY 180 tablet 2  . Cholecalciferol (EQL VITAMIN D3) 1000 units tablet Take 1 tablet (1,000 Units total) by mouth daily.    Marland Kitchen doxycycline (VIBRAMYCIN) 100 MG capsule Take 1 capsule (100 mg total) by mouth 2 (two) times daily. 14 capsule 0  . Omega-3 Fatty Acids (FISH OIL) 1200 MG CAPS Take by mouth daily.     No current facility-administered medications for this visit.     Allergies:   Patient has no known allergies.   Social History:  The patient  reports that she has never smoked. She has never used smokeless tobacco. She reports that she does not drink alcohol or use drugs.   Family History:  The patient's family history includes Arthritis in her father; Breast cancer in her mother; Heart disease in her brother, maternal grandfather, and mother; Hypertension in her mother; Stroke in her maternal grandmother and mother.    ROS:  Please see the history of present illness.   Otherwise, review of systems is positive for none.   All other systems are reviewed and negative.    PHYSICAL EXAM: VS:  BP (!) 146/90   Pulse 63   Ht 5' 2.5" (1.588  m)   Wt 153 lb (69.4 kg)   BMI 27.54 kg/m  , BMI Body mass index is 27.54 kg/m. GEN: Well nourished, well developed, in no acute distress  HEENT: normal  Neck: no JVD, carotid bruits, or masses Cardiac: RRR; no murmurs, rubs, or gallops,no edema  Respiratory:  clear to auscultation bilaterally, normal work of breathing GI: soft, nontender, nondistended, + BS MS: no deformity or atrophy  Skin: warm and dry Neuro:  Strength and sensation are intact Psych: euthymic mood, full affect  EKG:  EKG is not ordered today.   Recent Labs: 06/04/2015: ALT 16; BUN 10; Creatinine, Ser 0.89; Hemoglobin 14.6; Platelets 304.0; Potassium 4.0; Sodium  132; TSH 1.62    Lipid Panel     Component Value Date/Time   CHOL 177 06/04/2015 0928   TRIG 251.0 (H) 06/04/2015 0928   HDL 42.20 06/04/2015 0928   CHOLHDL 4 06/04/2015 0928   VLDL 50.2 (H) 06/04/2015 0928   LDLCALC 88 09/19/2013 1037   LDLDIRECT 97.0 06/04/2015 0928     Wt Readings from Last 3 Encounters:  04/28/16 153 lb (69.4 kg)  04/06/16 153 lb 12.8 oz (69.8 kg)  10/28/15 154 lb 1.9 oz (69.9 kg)      Other studies Reviewed: Additional studies/ records that were reviewed today include: Epic notes   ASSESSMENT AND PLAN:  1.  Coronary artery disease: Currently taking aspirin, atorvastatin and metoprolol. Asymptomatic.  2. Hypertension: Significantly elevated BP when stressed, I Sarah order an exercise treadmill stress test to rule out ischemia and to evaluate BP at stress.  3.  Hyperlipidemia: On atorvastatin.  Current medicines are reviewed at length with the patient today.   The patient does not have concerns regarding her medicines.  The following changes were made today:  Change metoprolol to coreg 6.125 mg  Labs/ tests ordered today include:  Orders Placed This Encounter  Procedures  . Exercise Tolerance Test     Disposition:   FU with Sarah Hendricks 3 months  Signed, Ena Dawley, MD  04/30/2016 7:35 AM     Sarah Hendricks [phone] (office) [phone] (fax)

## 2016-04-28 NOTE — Patient Instructions (Addendum)
Medication Instructions:   Your physician recommends that you continue on your current medications as directed. Please refer to the Current Medication list given to you today.    Testing/Procedures:  Your physician has requested that you have an exercise tolerance test. For further information please visit [email or web address]. Please also follow instruction sheet, as given.    Follow-Up:  Your physician wants you to follow-up in: [place] will receive a reminder letter in the mail two months in advance. If you don't receive a letter, please call our office to schedule the follow-up appointment.        If you need a refill on your cardiac medications before your next appointment, please call your pharmacy.

## 2016-05-20 NOTE — Progress Notes (Signed)
Subjective:    Patient ID: Sarah Hendricks, female    DOB: [birth year], 71 y.o.   MRN: [id number]  HPI Here for medicare wellness exam and an annual physical exam.   Rash on thumb:  Starting after raking leaves. It does not itch.  It has gotten larger.  She tried a steroid cream - prescription strength and it did not help. She tried otc lotions.  She tried an anti- fungal but only for a couple of days.   I have personally reviewed and have noted 1.The patient's medical and social history 2.Their use of alcohol, tobacco or illicit drugs 3.Their current medications and supplements 4.The patient's functional ability including ADL's, fall risks, home safety risks and                 hearing or visual impairment. 5.Diet and physical activities 6.Evidence for depression or mood disorders 7.Care team reviewed - Cardiology - Dr Meda Coffee    Are there smokers in your home (other than you)? No  Risk Factors Exercise: not much lately, active in house - exercises in warmer weather Dietary issues discussed: well balanced, tries to limit sugars/carb  Cardiac risk factors: advanced age, hypertension, hyperlipidemia  Depression Screen  Have you felt down, depressed or hopeless? No  Have you felt little interest or pleasure in doing things?  No  Activities of Daily Living In your present state of health, do you have any difficulty performing the following activities?:  Driving? No Managing money?  No Feeding yourself? No Getting from bed to chair? No Climbing a flight of stairs? No Preparing food and eating?: No Bathing or showering? No Getting dressed: No Getting to/using the toilet? No Moving around from place to place: No In the past year have you fallen or had a near fall?: No   Are you sexually active?  Yes   Do you have more than one partner?  No  Hearing Difficulties: No Do you often ask people to speak up or  repeat themselves? No Do you experience ringing or noises in your ears? No Do you have difficulty understanding soft or whispered voices? No Vision:              Any change in vision:   no             Up to date with eye exam:   Up to date  -  Q 2 years  Memory:  Do you feel that you have a problem with memory? No  Do you often misplace items? No  Do you feel safe at home?  Yes  Cognitive Testing  Alert, Orientated? Yes  Normal Appearance? Yes  Recall of three objects?  Yes  Can perform simple calculations? Yes  Displays appropriate judgment? Yes  Can read the correct time from a watch face? Yes   Advanced Directives have been discussed with the patient? Yes   Medications and allergies reviewed with patient and updated if appropriate.  Patient Active Problem List   Diagnosis Date Noted  . Heel pain 10/20/2015  . Osteopenia 05/30/2014  . CAD (coronary artery disease)   . Hypertension   . Hyperlipidemia   . Breast cancer ([place])   . Vitamin D deficiency     Current Outpatient Prescriptions on File Prior to Visit  Medication Sig Dispense Refill  . aspirin 81 MG tablet Take 81 mg by mouth daily.    Marland Kitchen atorvastatin (LIPITOR) 20 MG tablet TAKE 1 TABLET(20 MG) BY MOUTH DAILY  30 tablet 5  . calcium carbonate (OS-CAL) 600 MG TABS tablet Take 600 mg by mouth 2 (two) times daily with a meal.     . carvedilol (COREG) 6.25 MG tablet TAKE 1 TABLET BY MOUTH TWICE DAILY 180 tablet 2  . Cholecalciferol (EQL VITAMIN D3) 1000 units tablet Take 1 tablet (1,000 Units total) by mouth daily.    . Omega-3 Fatty Acids (FISH OIL) 1200 MG CAPS Take by mouth daily.     No current facility-administered medications on file prior to visit.     Past Medical History:  Diagnosis Date  . Breast cancer ([place]) 1994   lumpectomy and XRT, no chemo  . CAD (coronary artery disease) 2008   PTCA, in [place]  . GERD (gastroesophageal reflux disease)   . History of chicken pox   . Hyperlipidemia   . Hypertension     . Vitamin D deficiency     Past Surgical History:  Procedure Laterality Date  . BREAST BIOPSY Left 1994  . BREAST LUMPECTOMY Left 1994  . CRANIECTOMY FOR DEPRESSED SKULL FRACTURE  1986  . HEART ATTACK  2008   STENT PLACED  . TONSILLECTOMY  1966    Social History   Social History  . Marital status: Married    Spouse name: N/A  . Number of children: N/A  . Years of education: N/A   Social History Main Topics  . Smoking status: Never Smoker  . Smokeless tobacco: Never Used  . Alcohol use No  . Drug use: No  . Sexual activity: Not on file   Other Topics Concern  . Not on file   Social History Narrative   Married, lives with spouse   Retired from [employer] regularly, rides an exercise bike    Family History  Problem Relation Age of Onset  . Breast cancer Mother   . Hypertension Mother   . Heart disease Mother   . Stroke Mother   . Arthritis Father   . Heart disease Brother   . Stroke Maternal Grandmother   . Heart disease Maternal Grandfather     Review of Systems  Constitutional: Negative for chills and fever.  HENT: Negative for hearing loss and tinnitus.   Eyes: Negative for visual disturbance.  Respiratory: Negative for cough, shortness of breath and wheezing.   Cardiovascular: Negative for chest pain, palpitations and leg swelling.  Gastrointestinal: Negative for abdominal pain, blood in stool, constipation, diarrhea and nausea.       No gerd  Genitourinary: Negative for dysuria and hematuria.  Musculoskeletal: Positive for arthralgias (knees). Negative for back pain.  Skin: Positive for rash (5 weeks right thumb, non-itching). Negative for color change.  Neurological: Negative for light-headedness and headaches.  Psychiatric/Behavioral: Negative for dysphoric mood. The patient is not nervous/anxious.        Objective:   Vitals:   05/21/16 0913  BP: (!) 150/90  Pulse: 74  Resp: 16  Temp: 97.6 F (36.4 C)   Filed Weights    05/21/16 0913  Weight: 153 lb (69.4 kg)   Body mass index is 27.1 kg/m.  Wt Readings from Last 3 Encounters:  05/21/16 153 lb (69.4 kg)  04/28/16 153 lb (69.4 kg)  04/06/16 153 lb 12.8 oz (69.8 kg)     Physical Exam Constitutional: She appears well-developed and well-nourished. No distress.  HENT:  Head: Normocephalic and atraumatic.  Right Ear: External ear normal. Normal ear canal and TM Left Ear: External ear normal.  Normal ear canal and TM Mouth/Throat: Oropharynx is clear and moist.  Eyes: Conjunctivae and EOM are normal.  Neck: Neck supple. No tracheal deviation present. No thyromegaly present.  No carotid bruit  Cardiovascular: Normal rate, regular rhythm and normal heart sounds.   No murmur heard.  No edema. Pulmonary/Chest: Effort normal and breath sounds normal. No respiratory distress. She has no wheezes. She has no rales.  Breast: deferred to Gyn Abdominal: Soft. She exhibits no distension. There is no tenderness.  Lymphadenopathy: She has no cervical adenopathy.  Skin: Skin is warm and dry. She is not diaphoretic.  Psychiatric: She has a normal mood and affect. Her behavior is normal.         Assessment & Plan:   Wellness Exam: Immunizations - discussed td and shingles vaccines Colonoscopy  Up to date   Mammogram  Up to date  Gyn - no longer see gyn Dexa - due this month - had severe osteopeniaEye exam Hearing loss  none Memory concerns/difficulties  none Independent of ADLs  fully Stressed the importance of regular exercise -  Advised increasing exercise   Patient received copy of preventative screening tests/immunizations recommended for the next 5-10 years.   Physical exam: Screening blood work  ordered Immunizations - discussed td and shingles vaccines Colonoscopy  Up to date   Mammogram  Up to date  74 - no longer sees Dexa - due this month - had severe osteopenia Eye exams -  Up to date  Exercise - advised increasing exercise Weight -  advised weight loss Skin  - rash - will start otc - anti-fungal cream twice a day - take at least for 14 days, possibly longer, no other skin concerns Substance abuse  none  See Problem List for Assessment and Plan of chronic medical problems.   FU annually

## 2016-05-20 NOTE — Patient Instructions (Addendum)
Sarah Hendricks , Thank you for taking time to come for your Medicare Wellness Visit. I appreciate your ongoing commitment to your health goals. Please review the following plan we discussed and let me know if I can assist you in the future.   These are the goals we discussed: Goals    Increase exercise, work on weight loss      This is a list of the screening recommended for you and due dates:  Health Maintenance  Topic Date Due  . Tetanus Vaccine  02/17/1966  . Shingles Vaccine  02/18/2007  . DEXA scan (bone density measurement)  06/05/2016  . Mammogram  10/13/2017  . Colon Cancer Screening  05/31/2018  . Flu Shot  Completed  .  Hepatitis C: One time screening is recommended by Center for Disease Control  (CDC) for  adults born from [birth year] through 1965.   Completed  . Pneumonia vaccines  Completed     Test(s) ordered today. Your results will be released to [place] (or called to you) after review, usually within 72hours after test completion. If any changes need to be made, you will be notified at that same time.  All other Health Maintenance issues reviewed.   All recommended immunizations and age-appropriate screenings are up-to-date or discussed.  No immunizations administered today.   Medications reviewed and updated.  No changes recommended at this time.  Your prescription(s) have been submitted to your pharmacy. Please take as directed and contact our office if you believe you are having problem(s) with the medication(s).   Please followup in one year for a wellness visit   Health Maintenance, Female Introduction Adopting a healthy lifestyle and getting preventive care can go a long way to promote health and wellness. Talk with your health care provider about what schedule of regular examinations is right for you. This is a good chance for you to check in with your provider about disease prevention and staying healthy. In between checkups, there are plenty of things you  can do on your own. Experts have done a lot of research about which lifestyle changes and preventive measures are most likely to keep you healthy. Ask your health care provider for more information. Weight and diet Eat a healthy diet  Be sure to include plenty of vegetables, fruits, low-fat dairy products, and lean protein.  Do not eat a lot of foods high in solid fats, added sugars, or salt.  Get regular exercise. This is one of the most important things you can do for your health.  Most adults should exercise for at least 150 minutes each week. The exercise should increase your heart rate and make you sweat (moderate-intensity exercise).  Most adults should also do strengthening exercises at least twice a week. This is in addition to the moderate-intensity exercise. Maintain a healthy weight  Body mass index (BMI) is a measurement that can be used to identify possible weight problems. It estimates body fat based on height and weight. Your health care provider can help determine your BMI and help you achieve or maintain a healthy weight.  For females 34 years of age and older:  A BMI below 18.5 is considered underweight.  A BMI of 18.5 to 24.9 is normal.  A BMI of 25 to 29.9 is considered overweight.  A BMI of 30 and above is considered obese. Watch levels of cholesterol and blood lipids  You should start having your blood tested for lipids and cholesterol at 70 years of age, then have  this test every 5 years.  You may need to have your cholesterol levels checked more often if:  Your lipid or cholesterol levels are high.  You are older than 70 years of age.  You are at high risk for heart disease. Cancer screening Lung Cancer  Lung cancer screening is recommended for adults 62-36 years old who are at high risk for lung cancer because of a history of smoking.  A yearly low-dose CT scan of the lungs is recommended for people who:  Currently smoke.  Have quit within the  past 15 years.  Have at least a 30-pack-year history of smoking. A pack year is smoking an average of one pack of cigarettes a day for 1 year.  Yearly screening should continue until it has been 15 years since you quit.  Yearly screening should stop if you develop a health problem that would prevent you from having lung cancer treatment. Breast Cancer  Practice breast self-awareness. This means understanding how your breasts normally appear and feel.  It also means doing regular breast self-exams. Let your health care provider know about any changes, no matter how small.  If you are in your 20s or 30s, you should have a clinical breast exam (CBE) by a health care provider every 1-3 years as part of a regular health exam.  If you are 40 or older, have a CBE every year. Also consider having a breast X-ray (mammogram) every year.  If you have a family history of breast cancer, talk to your health care provider about genetic screening.  If you are at high risk for breast cancer, talk to your health care provider about having an MRI and a mammogram every year.  Breast cancer gene (BRCA) assessment is recommended for women who have family members with BRCA-related cancers. BRCA-related cancers include:  Breast.  Ovarian.  Tubal.  Peritoneal cancers.  Results of the assessment will determine the need for genetic counseling and BRCA1 and BRCA2 testing. Cervical Cancer  Your health care provider may recommend that you be screened regularly for cancer of the pelvic organs (ovaries, uterus, and vagina). This screening involves a pelvic examination, including checking for microscopic changes to the surface of your cervix (Pap test). You may be encouraged to have this screening done every 3 years, beginning at age 62.  For women ages 67-65, health care providers may recommend pelvic exams and Pap testing every 3 years, or they may recommend the Pap and pelvic exam, combined with testing for  human papilloma virus (HPV), every 5 years. Some types of HPV increase your risk of cervical cancer. Testing for HPV may also be done on women of any age with unclear Pap test results.  Other health care providers may not recommend any screening for nonpregnant women who are considered low risk for pelvic cancer and who do not have symptoms. Ask your health care provider if a screening pelvic exam is right for you.  If you have had past treatment for cervical cancer or a condition that could lead to cancer, you need Pap tests and screening for cancer for at least 20 years after your treatment. If Pap tests have been discontinued, your risk factors (such as having a new sexual partner) need to be reassessed to determine if screening should resume. Some women have medical problems that increase the chance of getting cervical cancer. In these cases, your health care provider may recommend more frequent screening and Pap tests. Colorectal Cancer  This type of cancer  can be detected and often prevented.  Routine colorectal cancer screening usually begins at 70 years of age and continues through 71 years of age.  Your health care provider may recommend screening at an earlier age if you have risk factors for colon cancer.  Your health care provider may also recommend using home test kits to check for hidden blood in the stool.  A small camera at the end of a tube can be used to examine your colon directly (sigmoidoscopy or colonoscopy). This is done to check for the earliest forms of colorectal cancer.  Routine screening usually begins at age 22.  Direct examination of the colon should be repeated every 5-10 years through 70 years of age. However, you may need to be screened more often if early forms of precancerous polyps or small growths are found. Skin Cancer  Check your skin from head to toe regularly.  Tell your health care provider about any new moles or changes in moles, especially if there  is a change in a mole's shape or color.  Also tell your health care provider if you have a mole that is larger than the size of a pencil eraser.  Always use sunscreen. Apply sunscreen liberally and repeatedly throughout the day.  Protect yourself by wearing long sleeves, pants, a wide-brimmed hat, and sunglasses whenever you are outside. Heart disease, diabetes, and high blood pressure  High blood pressure causes heart disease and increases the risk of stroke. High blood pressure is more likely to develop in:  People who have blood pressure in the high end of the normal range (130-139/85-89 mm Hg).  People who are overweight or obese.  People who are African American.  If you are 57-78 years of age, have your blood pressure checked every 3-5 years. If you are 83 years of age or older, have your blood pressure checked every year. You should have your blood pressure measured twice-once when you are at a hospital or clinic, and once when you are not at a hospital or clinic. Record the average of the two measurements. To check your blood pressure when you are not at a hospital or clinic, you can use:  An automated blood pressure machine at a pharmacy.  A home blood pressure monitor.  If you are between 46 years and 77 years old, ask your health care provider if you should take aspirin to prevent strokes.  Have regular diabetes screenings. This involves taking a blood sample to check your fasting blood sugar level.  If you are at a normal weight and have a low risk for diabetes, have this test once every three years after 70 years of age.  If you are overweight and have a high risk for diabetes, consider being tested at a younger age or more often. Preventing infection Hepatitis B  If you have a higher risk for hepatitis B, you should be screened for this virus. You are considered at high risk for hepatitis B if:  You were born in a country where hepatitis B is common. Ask your health  care provider which countries are considered high risk.  Your parents were born in a high-risk country, and you have not been immunized against hepatitis B (hepatitis B vaccine).  You have HIV or AIDS.  You use needles to inject street drugs.  You live with someone who has hepatitis B.  You have had sex with someone who has hepatitis B.  You get hemodialysis treatment.  You take certain medicines  for conditions, including cancer, organ transplantation, and autoimmune conditions. Hepatitis C  Blood testing is recommended for:  Everyone born from [birth year] through 1964.  Anyone with known risk factors for hepatitis C. Sexually transmitted infections (STIs)  You should be screened for sexually transmitted infections (STIs) including gonorrhea and chlamydia if:  You are sexually active and are younger than 70 years of age.  You are older than 70 years of age and your health care provider tells you that you are at risk for this type of infection.  Your sexual activity has changed since you were last screened and you are at an increased risk for chlamydia or gonorrhea. Ask your health care provider if you are at risk.  If you do not have HIV, but are at risk, it may be recommended that you take a prescription medicine daily to prevent HIV infection. This is called pre-exposure prophylaxis (PrEP). You are considered at risk if:  You are sexually active and do not regularly use condoms or know the HIV status of your partner(s).  You take drugs by injection.  You are sexually active with a partner who has HIV. Talk with your health care provider about whether you are at high risk of being infected with HIV. If you choose to begin PrEP, you should first be tested for HIV. You should then be tested every 3 months for as long as you are taking PrEP. Pregnancy  If you are premenopausal and you may become pregnant, ask your health care provider about preconception counseling.  If you may  become pregnant, take 400 to 800 micrograms (mcg) of folic acid every day.  If you want to prevent pregnancy, talk to your health care provider about birth control (contraception). Osteoporosis and menopause  Osteoporosis is a disease in which the bones lose minerals and strength with aging. This can result in serious bone fractures. Your risk for osteoporosis can be identified using a bone density scan.  If you are 41 years of age or older, or if you are at risk for osteoporosis and fractures, ask your health care provider if you should be screened.  Ask your health care provider whether you should take a calcium or vitamin D supplement to lower your risk for osteoporosis.  Menopause may have certain physical symptoms and risks.  Hormone replacement therapy may reduce some of these symptoms and risks. Talk to your health care provider about whether hormone replacement therapy is right for you. Follow these instructions at home:  Schedule regular health, dental, and eye exams.  Stay current with your immunizations.  Do not use any tobacco products including cigarettes, chewing tobacco, or electronic cigarettes.  If you are pregnant, do not drink alcohol.  If you are breastfeeding, limit how much and how often you drink alcohol.  Limit alcohol intake to no more than 1 drink per day for nonpregnant women. One drink equals 12 ounces of beer, 5 ounces of wine, or 1 ounces of hard liquor.  Do not use street drugs.  Do not share needles.  Ask your health care provider for help if you need support or information about quitting drugs.  Tell your health care provider if you often feel depressed.  Tell your health care provider if you have ever been abused or do not feel safe at home. This information is not intended to replace advice given to you by your health care provider. Make sure you discuss any questions you have with your health care provider. Document Released:  11/16/2010  Document Revised: 10/09/2015 Document Reviewed: 02/04/2015  2017 Elsevier

## 2016-05-21 ENCOUNTER — Encounter: Payer: Self-pay | Admitting: Internal Medicine

## 2016-05-21 ENCOUNTER — Other Ambulatory Visit (INDEPENDENT_AMBULATORY_CARE_PROVIDER_SITE_OTHER): Payer: Medicare Other

## 2016-05-21 ENCOUNTER — Ambulatory Visit (INDEPENDENT_AMBULATORY_CARE_PROVIDER_SITE_OTHER): Payer: Medicare Other | Admitting: Internal Medicine

## 2016-05-21 VITALS — BP 150/90 | HR 74 | Temp 97.6°F | Resp 16 | Ht 63.0 in | Wt 153.0 lb

## 2016-05-21 DIAGNOSIS — M85862 Other specified disorders of bone density and structure, left lower leg: Secondary | ICD-10-CM

## 2016-05-21 DIAGNOSIS — Z0000 Encounter for general adult medical examination without abnormal findings: Secondary | ICD-10-CM

## 2016-05-21 DIAGNOSIS — I2510 Atherosclerotic heart disease of native coronary artery without angina pectoris: Secondary | ICD-10-CM | POA: Diagnosis not present

## 2016-05-21 DIAGNOSIS — I10 Essential (primary) hypertension: Secondary | ICD-10-CM

## 2016-05-21 DIAGNOSIS — E785 Hyperlipidemia, unspecified: Secondary | ICD-10-CM

## 2016-05-21 DIAGNOSIS — C50912 Malignant neoplasm of unspecified site of left female breast: Secondary | ICD-10-CM

## 2016-05-21 LAB — CBC WITH DIFFERENTIAL/PLATELET
BASOS PCT: 0.8 % (ref 0.0–3.0)
Basophils Absolute: 0.1 10*3/uL (ref 0.0–0.1)
EOS PCT: 4.7 % (ref 0.0–5.0)
Eosinophils Absolute: 0.4 10*3/uL (ref 0.0–0.7)
HCT: 42.4 % (ref 36.0–46.0)
Hemoglobin: 14.7 g/dL (ref 12.0–15.0)
LYMPHS ABS: 2.1 10*3/uL (ref 0.7–4.0)
Lymphocytes Relative: 28.1 % (ref 12.0–46.0)
MCHC: 34.6 g/dL (ref 30.0–36.0)
MCV: 92.1 fl (ref 78.0–100.0)
MONO ABS: 0.7 10*3/uL (ref 0.1–1.0)
Monocytes Relative: 8.8 % (ref 3.0–12.0)
NEUTROS PCT: 57.6 % (ref 43.0–77.0)
Neutro Abs: 4.3 10*3/uL (ref 1.4–7.7)
PLATELETS: 297 10*3/uL (ref 150.0–400.0)
RBC: 4.61 Mil/uL (ref 3.87–5.11)
RDW: 13.2 % (ref 11.5–15.5)
WBC: 7.5 10*3/uL (ref 4.0–10.5)

## 2016-05-21 LAB — COMPREHENSIVE METABOLIC PANEL
ALT: 17 U/L (ref 0–35)
AST: 17 U/L (ref 0–37)
Albumin: 4.5 g/dL (ref 3.5–5.2)
Alkaline Phosphatase: 90 U/L (ref 39–117)
BUN: 10 mg/dL (ref 6–23)
CHLORIDE: 101 meq/L (ref 96–112)
CO2: 30 mEq/L (ref 19–32)
Calcium: 9.7 mg/dL (ref 8.4–10.5)
Creatinine, Ser: 0.82 mg/dL (ref 0.40–1.20)
GFR: 73.41 mL/min (ref >60.00)
GLUCOSE: 107 mg/dL — AB (ref 70–99)
POTASSIUM: 4.3 meq/L (ref 3.5–5.1)
SODIUM: 139 meq/L (ref 135–145)
Total Bilirubin: 0.8 mg/dL (ref 0.2–1.2)
Total Protein: 7.5 g/dL (ref 6.0–8.3)

## 2016-05-21 LAB — LIPID PANEL
Cholesterol: 147 mg/dL (ref 0–200)
HDL: 50.7 mg/dL (ref >39.00)
LDL Cholesterol: 65 mg/dL (ref 0–99)
NonHDL: 96.09
Total CHOL/HDL Ratio: 3
Triglycerides: 156 mg/dL — ABNORMAL HIGH (ref 0.0–149.0)
VLDL: 31.2 mg/dL (ref 0.0–40.0)

## 2016-05-21 LAB — TSH: TSH: 1.36 u[IU]/mL (ref 0.35–4.50)

## 2016-05-21 MED ORDER — ATORVASTATIN CALCIUM 20 MG PO TABS: ORAL_TABLET | ORAL | 3 refills | Status: DC

## 2016-05-21 NOTE — Assessment & Plan Note (Signed)
Check lipid panel  Continue daily statin Regular exercise and healthy diet encouraged  

## 2016-05-21 NOTE — Assessment & Plan Note (Signed)
dexa due this year Stressed regular exercise Taking calcium and vitamin d

## 2016-05-21 NOTE — Assessment & Plan Note (Signed)
Asymptomatic Increase exercise, work on weight loss Continue current medication - statin, ASA, BB

## 2016-05-21 NOTE — Progress Notes (Signed)
Pre visit review using our clinic review tool, if applicable. No additional management support is needed unless otherwise documented below in the visit note. 

## 2016-05-21 NOTE — Assessment & Plan Note (Signed)
BP elevated here today  Has follow up with Dr Meda Coffee Advised having her daughter check her BP at home cmp Continue current medication

## 2016-05-21 NOTE — Assessment & Plan Note (Signed)
Has mammogram annually  - up to date

## 2016-06-01 ENCOUNTER — Ambulatory Visit (INDEPENDENT_AMBULATORY_CARE_PROVIDER_SITE_OTHER): Payer: Medicare Other

## 2016-06-01 DIAGNOSIS — R079 Chest pain, unspecified: Secondary | ICD-10-CM | POA: Diagnosis not present

## 2016-06-01 DIAGNOSIS — E785 Hyperlipidemia, unspecified: Secondary | ICD-10-CM | POA: Diagnosis not present

## 2016-06-01 DIAGNOSIS — I10 Essential (primary) hypertension: Secondary | ICD-10-CM | POA: Diagnosis not present

## 2016-06-01 LAB — EXERCISE TOLERANCE TEST
Estimated workload: 7.3 METS
Exercise duration (min): 6 min
Exercise duration (sec): 16 s
MPHR: 151 {beats}/min
Peak HR: 141 {beats}/min
Percent HR: 93 %
RPE: 17
Rest HR: 71 {beats}/min

## 2016-07-12 ENCOUNTER — Telehealth: Payer: Self-pay | Admitting: *Deleted

## 2016-07-12 MED ORDER — CARVEDILOL 6.25 MG PO TABS: 6.2500 mg | ORAL_TABLET | Freq: Two times a day (BID) | ORAL | 3 refills | Status: DC

## 2016-07-12 NOTE — Telephone Encounter (Signed)
Rec'd call pt requesting refill on her carvedilol. Verified pharmacy inform inform sending refill electronically as we speak...Johny Chess

## 2016-09-02 ENCOUNTER — Other Ambulatory Visit: Payer: Self-pay | Admitting: Internal Medicine

## 2016-09-02 DIAGNOSIS — Z1231 Encounter for screening mammogram for malignant neoplasm of breast: Secondary | ICD-10-CM

## 2016-10-14 ENCOUNTER — Ambulatory Visit
Admission: RE | Admit: 2016-10-14 | Discharge: 2016-10-14 | Disposition: A | Discharge status: Home / Self Care | Payer: Medicare Other | Source: Ambulatory Visit | Attending: Internal Medicine | Admitting: Internal Medicine

## 2016-10-14 DIAGNOSIS — Z1231 Encounter for screening mammogram for malignant neoplasm of breast: Secondary | ICD-10-CM

## 2016-10-18 ENCOUNTER — Telehealth: Payer: Self-pay | Admitting: Internal Medicine

## 2016-10-18 NOTE — Telephone Encounter (Signed)
You can offer pt, 1045 slot or 11am

## 2016-10-18 NOTE — Telephone Encounter (Signed)
Patient is requesting to come in on June 12 when her husband. He has a CPE on that day at 8:30. She is having issues with her sciatic nerve. She states that they live kinda far way and it is easier if they can come together. Please advise. Thank you.

## 2016-10-25 NOTE — Telephone Encounter (Signed)
Patient was informed. She refused any of them times.

## 2017-01-10 ENCOUNTER — Encounter: Payer: Self-pay | Admitting: Cardiology

## 2017-01-10 ENCOUNTER — Encounter (INDEPENDENT_AMBULATORY_CARE_PROVIDER_SITE_OTHER): Payer: Self-pay

## 2017-01-10 ENCOUNTER — Ambulatory Visit (INDEPENDENT_AMBULATORY_CARE_PROVIDER_SITE_OTHER): Payer: Medicare Other | Admitting: Cardiology

## 2017-01-10 VITALS — BP 120/64 | HR 64 | Ht 63.0 in | Wt 154.0 lb

## 2017-01-10 DIAGNOSIS — R079 Chest pain, unspecified: Secondary | ICD-10-CM

## 2017-01-10 DIAGNOSIS — I10 Essential (primary) hypertension: Secondary | ICD-10-CM | POA: Diagnosis not present

## 2017-01-10 DIAGNOSIS — E782 Mixed hyperlipidemia: Secondary | ICD-10-CM

## 2017-01-10 NOTE — Addendum Note (Signed)
Addended by: Marlis Edelson C on: 01/10/2017 05:06 PM   Modules accepted: Orders

## 2017-01-10 NOTE — Progress Notes (Signed)
Cardiology Office Note   Date:  01/10/2017   ID:  Sarah Hendricks, DOB 17-Apr-1946, MRN [id number]  PCP:  Binnie Rail, MD  Cardiologist:   Ena Dawley, MD    Chief complain: 9 months follow up   History of Present Illness: Sarah Hendricks is a 71 y.o. female with history of hypertension, hyperlipidemia, CAD with stent placement in 2008, and breast cancer status post lumpectomy and XRT but no chemotherapy. 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  She states that her BP is elevated only at the doctors offices. However at the recent doctors visit her BP was 198/102, on another visit it was 220/114.  01/10/17  - 9 months follow up, she feels looks great, she walks daily and works at her backyard, and denies symptoms of palpitations, orthopnea, PND, lower extremity edema, claudication, dizziness, presyncope, syncope, bleeding. She is tolerating atorvastatin, denies muscle pain.  Past Medical History:  Diagnosis Date  . Breast cancer ([place]) 1994   lumpectomy and XRT, no chemo  . CAD (coronary artery disease) 2008   PTCA, in [place]  . GERD (gastroesophageal reflux disease)   . History of chicken pox   . Hyperlipidemia   . Hypertension   . Vitamin D deficiency    Past Surgical History:  Procedure Laterality Date  . BREAST BIOPSY Left 1994  . BREAST LUMPECTOMY Left 1994  . CRANIECTOMY FOR DEPRESSED SKULL FRACTURE  1986  . HEART ATTACK  2008   STENT PLACED  . TONSILLECTOMY  1966     Current Outpatient Prescriptions  Medication Sig Dispense Refill  . aspirin 81 MG tablet Take 81 mg by mouth daily.    Marland Kitchen atorvastatin (LIPITOR) 20 MG tablet TAKE 1 TABLET(20 MG) BY MOUTH DAILY 90 tablet 3  . calcium carbonate (OS-CAL) 600 MG TABS tablet Take 600 mg by mouth 2 (two) times daily with a meal.     . carvedilol (COREG) 6.25 MG tablet Take 1 tablet (6.25 mg total) by mouth 2 (two)  times daily. 180 tablet 3  . Cholecalciferol (EQL VITAMIN D3) 1000 units tablet Take 1 tablet (1,000 Units total) by mouth daily.    . Omega-3 Fatty Acids (FISH OIL) 1200 MG CAPS Take by mouth daily.     No current facility-administered medications for this visit.     Allergies:   Patient has no known allergies.   Social History:  The patient  reports that she has never smoked. She has never used smokeless tobacco. She reports that she does not drink alcohol or use drugs.   Family History:  The patient's family history includes Arthritis in her father; Breast cancer in her mother; Heart disease in her brother, maternal grandfather, and mother; Hypertension in her mother; Stroke in her maternal grandmother and mother.    ROS:  Please see the history of present illness.   Otherwise, review of systems is positive for none.   All other systems are reviewed and negative.    PHYSICAL EXAM: VS:  BP 120/64   Pulse 64   Ht 5\' 3"  (1.6 m)   Wt 154 lb (69.9 kg)   BMI 27.28 kg/m  , BMI Body mass index is 27.28 kg/m. GEN: Well nourished, well developed, in no acute distress  HEENT: normal  Neck: no JVD, carotid bruits, or masses Cardiac: RRR; no murmurs, rubs, or gallops,no edema  Respiratory:  clear  to auscultation bilaterally, normal work of breathing GI: soft, nontender, nondistended, + BS MS: no deformity or atrophy  Skin: warm and dry Neuro:  Strength and sensation are intact Psych: euthymic mood, full affect  EKG:  EKG Was ordered today and was personally reviewed, shows sinus rhythm with PACs unchanged from prior.  Recent Labs: 05/21/2016: ALT 17; BUN 10; Creatinine, Ser 0.82; Hemoglobin 14.7; Platelets 297.0; Potassium 4.3; Sodium 139; TSH 1.36    Lipid Panel     Component Value Date/Time   CHOL 147 05/21/2016 1014   TRIG 156.0 (H) 05/21/2016 1014   HDL 50.70 05/21/2016 1014   CHOLHDL 3 05/21/2016 1014   VLDL 31.2 05/21/2016 1014   LDLCALC 65 05/21/2016 1014   LDLDIRECT  97.0 06/04/2015 0928     Wt Readings from Last 3 Encounters:  01/10/17 154 lb (69.9 kg)  05/21/16 153 lb (69.4 kg)  04/28/16 153 lb (69.4 kg)      Other studies Reviewed: Additional studies/ records that were reviewed today include: Epic notes   ASSESSMENT AND PLAN:  1.  Coronary artery disease: Currently taking aspirin, atorvastatin and metoprolol. Asymptomatic. She underwent exercise treadmill stress test in January that was negative for ischemia and showed normal blood pressure response to exertion.  2. Hypertension: Significantly elevated BP when stressed, now finally controlled.   3.  Hyperlipidemia: On atorvastatin 20 mg daily and fish oil. LDL and HDL at goal, triglycerides borderline..  Current medicines are reviewed at length with the patient today.    Labs/ tests ordered today include:  No orders of the defined types were placed in this encounter.  Disposition:   FU in 1 year. Her labs will be checked by her primary care physician in January 2019.  Signed, Ena Dawley, MD  01/10/2017 10:02 AM     [facility] [address] [place]  [place] [address] [phone] (office) [phone] (fax)

## 2017-01-10 NOTE — Patient Instructions (Signed)
Your physician recommends that you continue on your current medications as directed. Please refer to the Current Medication list given to you today.    Your physician wants you to follow-up in: YEAR WITH DR NELSON You will receive a reminder letter in the mail two months in advance. If you don't receive a letter, please call our office to schedule the follow-up appointment.  

## 2017-02-15 ENCOUNTER — Ambulatory Visit (INDEPENDENT_AMBULATORY_CARE_PROVIDER_SITE_OTHER): Payer: Medicare Other

## 2017-02-15 DIAGNOSIS — Z23 Encounter for immunization: Secondary | ICD-10-CM

## 2017-05-13 ENCOUNTER — Other Ambulatory Visit: Payer: Self-pay | Admitting: Internal Medicine

## 2017-05-24 NOTE — Patient Instructions (Addendum)
Test(s) ordered today. Your results will be released to [place] (or called to you) after review, usually within 72hours after test completion. If any changes need to be made, you will be notified at that same time.  All other Health Maintenance issues reviewed.   All recommended immunizations and age-appropriate screenings are up-to-date or discussed.  No immunizations administered today.   Medications reviewed and updated.   No changes recommended at this time.  Your prescription(s) have been submitted to your pharmacy. Please take as directed and contact our office if you believe you are having problem(s) with the medication(s).   Please followup in one year with me; schedule a wellness visit with [facility] Maintenance, Female Adopting a healthy lifestyle and getting preventive care can go a long way to promote health and wellness. Talk with your health care provider about what schedule of regular examinations is right for you. This is a good chance for you to check in with your provider about disease prevention and staying healthy. In between checkups, there are plenty of things you can do on your own. Experts have done a lot of research about which lifestyle changes and preventive measures are most likely to keep you healthy. Ask your health care provider for more information. Weight and diet Eat a healthy diet  Be sure to include plenty of vegetables, fruits, low-fat dairy products, and lean protein.  Do not eat a lot of foods high in solid fats, added sugars, or salt.  Get regular exercise. This is one of the most important things you can do for your health. ? Most adults should exercise for at least 150 minutes each week. The exercise should increase your heart rate and make you sweat (moderate-intensity exercise). ? Most adults should also do strengthening exercises at least twice a week. This is in addition to the moderate-intensity exercise.  Maintain a healthy  weight  Body mass index (BMI) is a measurement that can be used to identify possible weight problems. It estimates body fat based on height and weight. Your health care provider can help determine your BMI and help you achieve or maintain a healthy weight.  For females 67 years of age and older: ? A BMI below 18.5 is considered underweight. ? A BMI of 18.5 to 24.9 is normal. ? A BMI of 25 to 29.9 is considered overweight. ? A BMI of 30 and above is considered obese.  Watch levels of cholesterol and blood lipids  You should start having your blood tested for lipids and cholesterol at 71 years of age, then have this test every 5 years.  You may need to have your cholesterol levels checked more often if: ? Your lipid or cholesterol levels are high. ? You are older than 71 years of age. ? You are at high risk for heart disease.  Cancer screening Lung Cancer  Lung cancer screening is recommended for adults 56-69 years old who are at high risk for lung cancer because of a history of smoking.  A yearly low-dose CT scan of the lungs is recommended for people who: ? Currently smoke. ? Have quit within the past 15 years. ? Have at least a 30-pack-year history of smoking. A pack year is smoking an average of one pack of cigarettes a day for 1 year.  Yearly screening should continue until it has been 15 years since you quit.  Yearly screening should stop if you develop a health problem that would prevent you from having lung  cancer treatment.  Breast Cancer  Practice breast self-awareness. This means understanding how your breasts normally appear and feel.  It also means doing regular breast self-exams. Let your health care provider know about any changes, no matter how small.  If you are in your 20s or 30s, you should have a clinical breast exam (CBE) by a health care provider every 1-3 years as part of a regular health exam.  If you are 81 or older, have a CBE every year. Also consider  having a breast X-ray (mammogram) every year.  If you have a family history of breast cancer, talk to your health care provider about genetic screening.  If you are at high risk for breast cancer, talk to your health care provider about having an MRI and a mammogram every year.  Breast cancer gene (BRCA) assessment is recommended for women who have family members with BRCA-related cancers. BRCA-related cancers include: ? Breast. ? Ovarian. ? Tubal. ? Peritoneal cancers.  Results of the assessment will determine the need for genetic counseling and BRCA1 and BRCA2 testing.  Cervical Cancer Your health care provider may recommend that you be screened regularly for cancer of the pelvic organs (ovaries, uterus, and vagina). This screening involves a pelvic examination, including checking for microscopic changes to the surface of your cervix (Pap test). You may be encouraged to have this screening done every 3 years, beginning at age 34.  For women ages 43-65, health care providers may recommend pelvic exams and Pap testing every 3 years, or they may recommend the Pap and pelvic exam, combined with testing for human papilloma virus (HPV), every 5 years. Some types of HPV increase your risk of cervical cancer. Testing for HPV may also be done on women of any age with unclear Pap test results.  Other health care providers may not recommend any screening for nonpregnant women who are considered low risk for pelvic cancer and who do not have symptoms. Ask your health care provider if a screening pelvic exam is right for you.  If you have had past treatment for cervical cancer or a condition that could lead to cancer, you need Pap tests and screening for cancer for at least 20 years after your treatment. If Pap tests have been discontinued, your risk factors (such as having a new sexual partner) need to be reassessed to determine if screening should resume. Some women have medical problems that increase  the chance of getting cervical cancer. In these cases, your health care provider may recommend more frequent screening and Pap tests.  Colorectal Cancer  This type of cancer can be detected and often prevented.  Routine colorectal cancer screening usually begins at 71 years of age and continues through 71 years of age.  Your health care provider may recommend screening at an earlier age if you have risk factors for colon cancer.  Your health care provider may also recommend using home test kits to check for hidden blood in the stool.  A small camera at the end of a tube can be used to examine your colon directly (sigmoidoscopy or colonoscopy). This is done to check for the earliest forms of colorectal cancer.  Routine screening usually begins at age 41.  Direct examination of the colon should be repeated every 5-10 years through 71 years of age. However, you may need to be screened more often if early forms of precancerous polyps or small growths are found.  Skin Cancer  Check your skin from head to toe  regularly.  Tell your health care provider about any new moles or changes in moles, especially if there is a change in a mole's shape or color.  Also tell your health care provider if you have a mole that is larger than the size of a pencil eraser.  Always use sunscreen. Apply sunscreen liberally and repeatedly throughout the day.  Protect yourself by wearing long sleeves, pants, a wide-brimmed hat, and sunglasses whenever you are outside.  Heart disease, diabetes, and high blood pressure  High blood pressure causes heart disease and increases the risk of stroke. High blood pressure is more likely to develop in: ? People who have blood pressure in the high end of the normal range (130-139/85-89 mm Hg). ? People who are overweight or obese. ? People who are African American.  If you are 63-63 years of age, have your blood pressure checked every 3-5 years. If you are 41 years of age  or older, have your blood pressure checked every year. You should have your blood pressure measured twice-once when you are at a hospital or clinic, and once when you are not at a hospital or clinic. Record the average of the two measurements. To check your blood pressure when you are not at a hospital or clinic, you can use: ? An automated blood pressure machine at a pharmacy. ? A home blood pressure monitor.  If you are between 76 years and 60 years old, ask your health care provider if you should take aspirin to prevent strokes.  Have regular diabetes screenings. This involves taking a blood sample to check your fasting blood sugar level. ? If you are at a normal weight and have a low risk for diabetes, have this test once every three years after 71 years of age. ? If you are overweight and have a high risk for diabetes, consider being tested at a younger age or more often. Preventing infection Hepatitis B  If you have a higher risk for hepatitis B, you should be screened for this virus. You are considered at high risk for hepatitis B if: ? You were born in a country where hepatitis B is common. Ask your health care provider which countries are considered high risk. ? Your parents were born in a high-risk country, and you have not been immunized against hepatitis B (hepatitis B vaccine). ? You have HIV or AIDS. ? You use needles to inject street drugs. ? You live with someone who has hepatitis B. ? You have had sex with someone who has hepatitis B. ? You get hemodialysis treatment. ? You take certain medicines for conditions, including cancer, organ transplantation, and autoimmune conditions.  Hepatitis C  Blood testing is recommended for: ? Everyone born from [birth year] through 1964. ? Anyone with known risk factors for hepatitis C.  Sexually transmitted infections (STIs)  You should be screened for sexually transmitted infections (STIs) including gonorrhea and chlamydia if: ? You are  sexually active and are younger than 71 years of age. ? You are older than 70 years of age and your health care provider tells you that you are at risk for this type of infection. ? Your sexual activity has changed since you were last screened and you are at an increased risk for chlamydia or gonorrhea. Ask your health care provider if you are at risk.  If you do not have HIV, but are at risk, it may be recommended that you take a prescription medicine daily to prevent HIV infection. This  is called pre-exposure prophylaxis (PrEP). You are considered at risk if: ? You are sexually active and do not regularly use condoms or know the HIV status of your partner(s). ? You take drugs by injection. ? You are sexually active with a partner who has HIV.  Talk with your health care provider about whether you are at high risk of being infected with HIV. If you choose to begin PrEP, you should first be tested for HIV. You should then be tested every 3 months for as long as you are taking PrEP. Pregnancy  If you are premenopausal and you may become pregnant, ask your health care provider about preconception counseling.  If you may become pregnant, take 400 to 800 micrograms (mcg) of folic acid every day.  If you want to prevent pregnancy, talk to your health care provider about birth control (contraception). Osteoporosis and menopause  Osteoporosis is a disease in which the bones lose minerals and strength with aging. This can result in serious bone fractures. Your risk for osteoporosis can be identified using a bone density scan.  If you are 48 years of age or older, or if you are at risk for osteoporosis and fractures, ask your health care provider if you should be screened.  Ask your health care provider whether you should take a calcium or vitamin D supplement to lower your risk for osteoporosis.  Menopause may have certain physical symptoms and risks.  Hormone replacement therapy may reduce some  of these symptoms and risks. Talk to your health care provider about whether hormone replacement therapy is right for you. Follow these instructions at home:  Schedule regular health, dental, and eye exams.  Stay current with your immunizations.  Do not use any tobacco products including cigarettes, chewing tobacco, or electronic cigarettes.  If you are pregnant, do not drink alcohol.  If you are breastfeeding, limit how much and how often you drink alcohol.  Limit alcohol intake to no more than 1 drink per day for nonpregnant women. One drink equals 12 ounces of beer, 5 ounces of wine, or 1 ounces of hard liquor.  Do not use street drugs.  Do not share needles.  Ask your health care provider for help if you need support or information about quitting drugs.  Tell your health care provider if you often feel depressed.  Tell your health care provider if you have ever been abused or do not feel safe at home. This information is not intended to replace advice given to you by your health care provider. Make sure you discuss any questions you have with your health care provider. Document Released: 11/16/2010 Document Revised: 10/09/2015 Document Reviewed: 02/04/2015 Elsevier Interactive Patient Education  [employer].

## 2017-05-24 NOTE — Progress Notes (Signed)
Subjective:    Patient ID: Sarah Hendricks, female    DOB: 1946-01-15, 72 y.o.   MRN: [id number]  HPI She is here for a physical exam.   She walks a few times a week.  She does a lot of yard work in the [employer].    Overall, she feels well.  She has no complaints and denies any changes in her history.   Medications and allergies reviewed with patient and updated if appropriate.  Patient Active Problem List   Diagnosis Date Noted  . Heel pain 10/20/2015  . Osteopenia 05/30/2014  . CAD (coronary artery disease)   . Hypertension   . Hyperlipidemia   . Breast cancer ([place])   . Vitamin D deficiency     Current Outpatient Medications on File Prior to Visit  Medication Sig Dispense Refill  . aspirin 81 MG tablet Take 81 mg by mouth daily.    Marland Kitchen atorvastatin (LIPITOR) 20 MG tablet TAKE 1 TABLET(20 MG) BY MOUTH DAILY 90 tablet 0  . calcium carbonate (OS-CAL) 600 MG TABS tablet Take 600 mg by mouth 2 (two) times daily with a meal.     . carvedilol (COREG) 6.25 MG tablet Take 1 tablet (6.25 mg total) by mouth 2 (two) times daily. 180 tablet 3  . Cholecalciferol (EQL VITAMIN D3) 1000 units tablet Take 1 tablet (1,000 Units total) by mouth daily.    . Omega-3 Fatty Acids (FISH OIL) 1200 MG CAPS Take by mouth daily.     No current facility-administered medications on file prior to visit.     Past Medical History:  Diagnosis Date  . Breast cancer ([place]) 1994   lumpectomy and XRT, no chemo  . CAD (coronary artery disease) 2008   PTCA, in [place]  . GERD (gastroesophageal reflux disease)   . History of chicken pox   . Hyperlipidemia   . Hypertension   . Vitamin D deficiency     Past Surgical History:  Procedure Laterality Date  . BREAST BIOPSY Left 1994  . BREAST LUMPECTOMY Left 1994  . CRANIECTOMY FOR DEPRESSED SKULL FRACTURE  1986  . HEART ATTACK  2008   STENT PLACED  . TONSILLECTOMY  1966    Social History   Socioeconomic History  . Marital status: Married    Spouse  name: Not on file  . Number of children: Not on file  . Years of education: Not on file  . Highest education level: Not on file  Social Needs  . Financial resource strain: Not on file  . Food insecurity - worry: Not on file  . Food insecurity - inability: Not on file  . Transportation needs - medical: Not on file  . Transportation needs - non-medical: Not on file  Occupational History  . Not on file  Tobacco Use  . Smoking status: Never Smoker  . Smokeless tobacco: Never Used  Substance and Sexual Activity  . Alcohol use: No  . Drug use: No  . Sexual activity: Not on file  Other Topics Concern  . Not on file  Social History Narrative   Married, lives with spouse   Retired from [employer] regularly, rides an exercise bike    Family History  Problem Relation Age of Onset  . Breast cancer Mother   . Hypertension Mother   . Heart disease Mother   . Stroke Mother   . Arthritis Father   . Heart disease Brother   . Stroke Maternal  Grandmother   . Heart disease Maternal Grandfather     Review of Systems  Constitutional: Negative for chills, fatigue and fever.  HENT: Positive for sore throat (for a couuple days). Negative for congestion, ear pain and sinus pain.   Eyes: Negative for visual disturbance.  Respiratory: Negative for cough, shortness of breath and wheezing.   Cardiovascular: Negative for chest pain, palpitations and leg swelling.  Gastrointestinal: Negative for abdominal pain, blood in stool, constipation, diarrhea and nausea.       No gerd  Genitourinary: Negative for dysuria and hematuria.  Musculoskeletal: Positive for arthralgias (knees). Negative for back pain.  Skin: Negative for color change and rash.  Neurological: Positive for dizziness (minimal at times). Negative for light-headedness and headaches.  Psychiatric/Behavioral: Negative for dysphoric mood. The patient is not nervous/anxious.        Objective:   Vitals:   05/25/17 0821    BP: 132/84  Pulse: 65  Resp: 16  Temp: 97.7 F (36.5 C)  SpO2: 97%   Filed Weights   05/25/17 0821  Weight: 156 lb (70.8 kg)   Body mass index is 27.63 kg/m.  Wt Readings from Last 3 Encounters:  05/25/17 156 lb (70.8 kg)  01/10/17 154 lb (69.9 kg)  05/21/16 153 lb (69.4 kg)     Physical Exam Constitutional: She appears well-developed and well-nourished. No distress.  HENT:  Head: Normocephalic and atraumatic.  Right Ear: External ear normal. Normal ear canal and TM Left Ear: External ear normal.  Normal ear canal and TM Mouth/Throat: Oropharynx is clear and moist.  Eyes: Conjunctivae and EOM are normal.  Neck: Neck supple. No tracheal deviation present. No thyromegaly present.  No carotid bruit  Cardiovascular: Normal rate, regular rhythm and normal heart sounds.   No murmur heard.  No edema. Pulmonary/Chest: Effort normal and breath sounds normal. No respiratory distress. She has no wheezes. She has no rales.  Breast: deferred  Abdominal: Soft. She exhibits no distension. There is no tenderness.  Lymphadenopathy: She has no cervical adenopathy.  Skin: Skin is warm and dry. She is not diaphoretic.  Psychiatric: She has a normal mood and affect. Her behavior is normal.        Assessment & Plan:   Physical exam: Screening blood work  ordered Immunizations  Discussed shingrix and td, others up to date Colonoscopy   Up to date  Mammogram   Up to date  Gyn   No longer following Dexa   Last done 2016 - repeat due Eye exams  Up to date  EKG   Done 12/2016 Exercise  Walking regularly, yard work in [employer] [place] [employer]  Advised weight loss, currently overweight Skin  No concerns Substance abuse  none  See Problem List for Assessment and Plan of chronic medical problems.   FU annually

## 2017-05-25 ENCOUNTER — Other Ambulatory Visit (INDEPENDENT_AMBULATORY_CARE_PROVIDER_SITE_OTHER): Payer: Medicare Other

## 2017-05-25 ENCOUNTER — Encounter: Payer: Self-pay | Admitting: Internal Medicine

## 2017-05-25 ENCOUNTER — Ambulatory Visit (INDEPENDENT_AMBULATORY_CARE_PROVIDER_SITE_OTHER): Payer: Medicare Other | Admitting: Internal Medicine

## 2017-05-25 VITALS — BP 132/84 | HR 65 | Temp 97.7°F | Resp 16 | Ht 63.0 in | Wt 156.0 lb

## 2017-05-25 DIAGNOSIS — I10 Essential (primary) hypertension: Secondary | ICD-10-CM | POA: Diagnosis not present

## 2017-05-25 DIAGNOSIS — I2510 Atherosclerotic heart disease of native coronary artery without angina pectoris: Secondary | ICD-10-CM

## 2017-05-25 DIAGNOSIS — R739 Hyperglycemia, unspecified: Secondary | ICD-10-CM | POA: Diagnosis not present

## 2017-05-25 DIAGNOSIS — Z1331 Encounter for screening for depression: Secondary | ICD-10-CM | POA: Diagnosis not present

## 2017-05-25 DIAGNOSIS — M85862 Other specified disorders of bone density and structure, left lower leg: Secondary | ICD-10-CM | POA: Diagnosis not present

## 2017-05-25 DIAGNOSIS — E785 Hyperlipidemia, unspecified: Secondary | ICD-10-CM | POA: Diagnosis not present

## 2017-05-25 DIAGNOSIS — Z0000 Encounter for general adult medical examination without abnormal findings: Secondary | ICD-10-CM | POA: Diagnosis not present

## 2017-05-25 DIAGNOSIS — Z853 Personal history of malignant neoplasm of breast: Secondary | ICD-10-CM | POA: Diagnosis not present

## 2017-05-25 LAB — COMPREHENSIVE METABOLIC PANEL
ALBUMIN: 4.3 g/dL (ref 3.5–5.2)
ALT: 18 U/L (ref 0–35)
AST: 16 U/L (ref 0–37)
Alkaline Phosphatase: 81 U/L (ref 39–117)
BILIRUBIN TOTAL: 0.6 mg/dL (ref 0.2–1.2)
BUN: 13 mg/dL (ref 6–23)
CO2: 28 mEq/L (ref 19–32)
CREATININE: 0.79 mg/dL (ref 0.40–1.20)
Calcium: 9.6 mg/dL (ref 8.4–10.5)
Chloride: 100 mEq/L (ref 96–112)
GFR: 76.41 mL/min (ref >60.00)
GLUCOSE: 107 mg/dL — AB (ref 70–99)
POTASSIUM: 4.1 meq/L (ref 3.5–5.1)
SODIUM: 137 meq/L (ref 135–145)
TOTAL PROTEIN: 7.2 g/dL (ref 6.0–8.3)

## 2017-05-25 LAB — LIPID PANEL
CHOLESTEROL: 152 mg/dL (ref 0–200)
HDL: 44.4 mg/dL (ref >39.00)
LDL Cholesterol: 69 mg/dL (ref 0–99)
NONHDL: 108.05
Total CHOL/HDL Ratio: 3
Triglycerides: 194 mg/dL — ABNORMAL HIGH (ref 0.0–149.0)
VLDL: 38.8 mg/dL (ref 0.0–40.0)

## 2017-05-25 LAB — CBC WITH DIFFERENTIAL/PLATELET
BASOS ABS: 0.1 10*3/uL (ref 0.0–0.1)
Basophils Relative: 1.2 % (ref 0.0–3.0)
EOS ABS: 0.3 10*3/uL (ref 0.0–0.7)
Eosinophils Relative: 4.6 % (ref 0.0–5.0)
HCT: 43.2 % (ref 36.0–46.0)
HEMOGLOBIN: 14.6 g/dL (ref 12.0–15.0)
LYMPHS ABS: 1.9 10*3/uL (ref 0.7–4.0)
Lymphocytes Relative: 28.2 % (ref 12.0–46.0)
MCHC: 33.9 g/dL (ref 30.0–36.0)
MCV: 94.4 fl (ref 78.0–100.0)
Monocytes Absolute: 0.6 10*3/uL (ref 0.1–1.0)
Monocytes Relative: 9.1 % (ref 3.0–12.0)
NEUTROS PCT: 56.9 % (ref 43.0–77.0)
Neutro Abs: 3.8 10*3/uL (ref 1.4–7.7)
Platelets: 275 10*3/uL (ref 150.0–400.0)
RBC: 4.58 Mil/uL (ref 3.87–5.11)
RDW: 13.3 % (ref 11.5–15.5)
WBC: 6.6 10*3/uL (ref 4.0–10.5)

## 2017-05-25 LAB — TSH: TSH: 1.62 u[IU]/mL (ref 0.35–4.50)

## 2017-05-25 LAB — HEMOGLOBIN A1C: HEMOGLOBIN A1C: 5.8 % (ref 4.6–6.5)

## 2017-05-25 MED ORDER — ATORVASTATIN CALCIUM 20 MG PO TABS: 20.0000 mg | ORAL_TABLET | Freq: Every day | ORAL | 3 refills | Status: DC

## 2017-05-25 MED ORDER — CARVEDILOL 6.25 MG PO TABS: 6.2500 mg | ORAL_TABLET | Freq: Two times a day (BID) | ORAL | 3 refills | Status: DC

## 2017-05-25 NOTE — Assessment & Plan Note (Signed)
BP well controlled Current regimen effective and well tolerated Continue current medications at current doses Cmp, tsh, cbc 

## 2017-05-25 NOTE — Assessment & Plan Note (Signed)
No evidence of recurrence Mammogram up to date 

## 2017-05-25 NOTE — Assessment & Plan Note (Signed)
dexa due -ordered Continue regular walking Taking cal/vitamin d

## 2017-05-25 NOTE — Assessment & Plan Note (Signed)
No chest pain, palpitations, sob Seeing cardiology annually Check labs Continue current medication

## 2017-05-25 NOTE — Assessment & Plan Note (Signed)
Check a1c 

## 2017-05-25 NOTE — Assessment & Plan Note (Signed)
Check lipid panel  Continue daily statin Regular exercise and healthy diet encouraged  

## 2017-05-26 ENCOUNTER — Encounter: Payer: Self-pay | Admitting: Internal Medicine

## 2017-05-26 DIAGNOSIS — R7303 Prediabetes: Secondary | ICD-10-CM | POA: Insufficient documentation

## 2017-05-27 ENCOUNTER — Encounter: Payer: Self-pay | Admitting: Emergency Medicine

## 2017-06-13 ENCOUNTER — Ambulatory Visit: Payer: Self-pay | Admitting: *Deleted

## 2017-06-13 ENCOUNTER — Ambulatory Visit (INDEPENDENT_AMBULATORY_CARE_PROVIDER_SITE_OTHER)
Admission: RE | Admit: 2017-06-13 | Discharge: 2017-06-13 | Disposition: A | Discharge status: Home / Self Care | Payer: Medicare Other | Source: Ambulatory Visit | Attending: Internal Medicine | Admitting: Internal Medicine

## 2017-06-13 DIAGNOSIS — M85862 Other specified disorders of bone density and structure, left lower leg: Secondary | ICD-10-CM | POA: Diagnosis not present

## 2017-06-13 NOTE — Telephone Encounter (Signed)
Pt called states that when she takes deep breaths she has a pain on her left side and she is unable to sleep comfortable on that side; she states that this has been going on for at least a week; she has taken 2 tylenol daily and it has relieved the pain "some" and she has applied heat; pt asked if the doctor would call her something in without being seen; Unable to  Complete nurse triage process; pt states that she would call back.   Reason for Disposition . [1] MILD pain (i.e., scale 1-3; does not interfere with normal activities) AND [2] present > 3 days  Answer Assessment - Initial Assessment Questions 1. LOCATION: "Where does it hurt?" (e.g., left, right)     Left side 2. ONSET: "When did the pain start?"     At least a weak ago 3. SEVERITY: "How bad is the pain?" (e.g., Scale 1-10; mild, moderate, or severe)   - MILD (1-3): doesn't interfere with normal activities    - MODERATE (4-7): interferes with normal activities or awakens from sleep    - SEVERE (8-10): excruciating pain and patient unable to do normal activities (stays in bed)       *No Answer* 4. PATTERN: "Does the pain come and go, or is it constant?"      "When taking a deep breath, and can't lay on that side" 5. CAUSE: "What do you think is causing the pain?"     unknown 6. OTHER SYMPTOMS:  "Do you have any other symptoms?" (e.g., fever, abdominal pain, vomiting, leg weakness, burning with urination, blood in urine)     *No Answer* 7. PREGNANCY:  "Is there any chance you are pregnant?" "When was your last menstrual period?"     *No Answer*  Protocols used: FLANK PAIN-A-AH

## 2017-06-15 DIAGNOSIS — M85862 Other specified disorders of bone density and structure, left lower leg: Secondary | ICD-10-CM | POA: Diagnosis not present

## 2017-06-24 ENCOUNTER — Other Ambulatory Visit: Payer: Self-pay | Admitting: Internal Medicine

## 2017-08-29 ENCOUNTER — Other Ambulatory Visit (INDEPENDENT_AMBULATORY_CARE_PROVIDER_SITE_OTHER): Payer: Medicare Other

## 2017-08-29 ENCOUNTER — Encounter: Payer: Self-pay | Admitting: Internal Medicine

## 2017-08-29 ENCOUNTER — Ambulatory Visit: Payer: Medicare Other | Admitting: Internal Medicine

## 2017-08-29 VITALS — BP 132/82 | HR 79 | Temp 98.3°F | Resp 16 | Wt 157.0 lb

## 2017-08-29 DIAGNOSIS — R300 Dysuria: Secondary | ICD-10-CM

## 2017-08-29 DIAGNOSIS — N3000 Acute cystitis without hematuria: Secondary | ICD-10-CM | POA: Insufficient documentation

## 2017-08-29 LAB — URINALYSIS, ROUTINE W REFLEX MICROSCOPIC
BILIRUBIN URINE: NEGATIVE
KETONES UR: NEGATIVE
NITRITE: NEGATIVE
PH: 6 (ref 5.0–8.0)
Specific Gravity, Urine: 1.01 (ref 1.000–1.030)
TOTAL PROTEIN, URINE-UPE24: NEGATIVE
Urine Glucose: NEGATIVE
Urobilinogen, UA: 0.2 (ref 0.0–1.0)

## 2017-08-29 MED ORDER — SULFAMETHOXAZOLE-TRIMETHOPRIM 800-160 MG PO TABS: 1.0000 | ORAL_TABLET | Freq: Two times a day (BID) | ORAL | 0 refills | Status: DC

## 2017-08-29 NOTE — Patient Instructions (Signed)
Give a urine sample in the lab.     Take the antibiotic as prescribed.  Take tylenol if needed.  Increase your water intake.      Urinary Tract Infection, Adult A urinary tract infection (UTI) is an infection of any part of the urinary tract, which includes the kidneys, ureters, bladder, and urethra. These organs make, store, and get rid of urine in the body. UTI can be a bladder infection (cystitis) or kidney infection (pyelonephritis). What are the causes? This infection may be caused by fungi, viruses, or bacteria. Bacteria are the most common cause of UTIs. This condition can also be caused by repeated incomplete emptying of the bladder during urination. What increases the risk? This condition is more likely to develop if:  You ignore your need to urinate or hold urine for long periods of time.  You do not empty your bladder completely during urination.  You wipe back to front after urinating or having a bowel movement, if you are female.  You are uncircumcised, if you are female.  You are constipated.  You have a urinary catheter that stays in place (indwelling).  You have a weak defense (immune) system.  You have a medical condition that affects your bowels, kidneys, or bladder.  You have diabetes.  You take antibiotic medicines frequently or for long periods of time, and the antibiotics no longer work well against certain types of infections (antibiotic resistance).  You take medicines that irritate your urinary tract.  You are exposed to chemicals that irritate your urinary tract.  You are female.  What are the signs or symptoms? Symptoms of this condition include:  Fever.  Frequent urination or passing small amounts of urine frequently.  Needing to urinate urgently.  Pain or burning with urination.  Urine that smells bad or unusual.  Cloudy urine.  Pain in the lower abdomen or back.  Trouble urinating.  Blood in the urine.  Vomiting or being less  hungry than normal.  Diarrhea or abdominal pain.  Vaginal discharge, if you are female.  How is this diagnosed? This condition is diagnosed with a medical history and physical exam. You will also need to provide a urine sample to test your urine. Other tests may be done, including:  Blood tests.  Sexually transmitted disease (STD) testing.  If you have had more than one UTI, a cystoscopy or imaging studies may be done to determine the cause of the infections. How is this treated? Treatment for this condition often includes a combination of two or more of the following:  Antibiotic medicine.  Other medicines to treat less common causes of UTI.  Over-the-counter medicines to treat pain.  Drinking enough water to stay hydrated.  Follow these instructions at home:  Take over-the-counter and prescription medicines only as told by your health care provider.  If you were prescribed an antibiotic, take it as told by your health care provider. Do not stop taking the antibiotic even if you start to feel better.  Avoid alcohol, caffeine, tea, and carbonated beverages. They can irritate your bladder.  Drink enough fluid to keep your urine clear or pale yellow.  Keep all follow-up visits as told by your health care provider. This is important.  Make sure to: ? Empty your bladder often and completely. Do not hold urine for long periods of time. ? Empty your bladder before and after sex. ? Wipe from front to back after a bowel movement if you are female. Use each tissue one  time when you wipe. Contact a health care provider if:  You have back pain.  You have a fever.  You feel nauseous or vomit.  Your symptoms do not get better after 3 days.  Your symptoms go away and then return. Get help right away if:  You have severe back pain or lower abdominal pain.  You are vomiting and cannot keep down any medicines or water. This information is not intended to replace advice given  to you by your health care provider. Make sure you discuss any questions you have with your health care provider. Document Released: 02/10/2005 Document Revised: 10/15/2015 Document Reviewed: 03/24/2015 Elsevier Interactive Patient Education  [employer].

## 2017-08-29 NOTE — Assessment & Plan Note (Signed)
Will send to lab for urinalysis and culture Given symptoms will start an antibiotic-Bactrim Take the antibiotic as prescribed.   Take tylenol if needed.   Increase your water intake.  Call if no improvement

## 2017-08-29 NOTE — Progress Notes (Signed)
Subjective:    Patient ID: Sarah Hendricks, female    DOB: 07/20/1945, 72 y.o.   MRN: [id number]  HPI The patient is here for an acute visit.   ? UTI:  Her symptoms started two weeks ago.  She states dysuria, odor to her urine, vulvar irritation, urine frequency, sometimes urinary urgency.   Some chills.   She denies discoloration of the urine, nausea, abdominal, back pain and fever.   She drank some cranberry juice.  Her symptoms have not improved  Her last UTI was years ago.  She thinks she took Levaquin at that time.   Medications and allergies reviewed with patient and updated if appropriate.  Patient Active Problem List   Diagnosis Date Noted  . Prediabetes 05/26/2017  . Hyperglycemia 05/25/2017  . Heel pain 10/20/2015  . Osteopenia 05/30/2014  . CAD (coronary artery disease)   . Hypertension   . Hyperlipidemia   . History of breast cancer   . Vitamin D deficiency     Current Outpatient Medications on File Prior to Visit  Medication Sig Dispense Refill  . aspirin 81 MG tablet Take 81 mg by mouth daily.    Marland Kitchen atorvastatin (LIPITOR) 20 MG tablet Take 1 tablet (20 mg total) by mouth daily. 90 tablet 3  . calcium carbonate (OS-CAL) 600 MG TABS tablet Take 600 mg by mouth 2 (two) times daily with a meal.     . carvedilol (COREG) 6.25 MG tablet Take 1 tablet (6.25 mg total) by mouth 2 (two) times daily. 180 tablet 3  . Cholecalciferol (EQL VITAMIN D3) 1000 units tablet Take 1 tablet (1,000 Units total) by mouth daily.    . Omega-3 Fatty Acids (FISH OIL) 1200 MG CAPS Take by mouth daily.     No current facility-administered medications on file prior to visit.     Past Medical History:  Diagnosis Date  . Breast cancer ([place]) 1994   lumpectomy and XRT, no chemo  . CAD (coronary artery disease) 2008   PTCA, in [place]  . GERD (gastroesophageal reflux disease)   . History of chicken pox   . Hyperlipidemia   . Hypertension   . Vitamin D deficiency     Past Surgical  History:  Procedure Laterality Date  . BREAST BIOPSY Left 1994  . BREAST LUMPECTOMY Left 1994  . CRANIECTOMY FOR DEPRESSED SKULL FRACTURE  1986  . HEART ATTACK  2008   STENT PLACED  . TONSILLECTOMY  1966    Social History   Socioeconomic History  . Marital status: Married    Spouse name: Not on file  . Number of children: Not on file  . Years of education: Not on file  . Highest education level: Not on file  Occupational History  . Not on file  Social Needs  . Financial resource strain: Not on file  . Food insecurity:    Worry: Not on file    Inability: Not on file  . Transportation needs:    Medical: Not on file    Non-medical: Not on file  Tobacco Use  . Smoking status: Never Smoker  . Smokeless tobacco: Never Used  Substance and Sexual Activity  . Alcohol use: No  . Drug use: No  . Sexual activity: Not on file  Lifestyle  . Physical activity:    Days per week: Not on file    Minutes per session: Not on file  . Stress: Not on file  Relationships  . Social connections:  Talks on phone: Not on file    Gets together: Not on file    Attends religious service: Not on file    Active member of club or organization: Not on file    Attends meetings of clubs or organizations: Not on file    Relationship status: Not on file  Other Topics Concern  . Not on file  Social History Narrative   Married, lives with spouse   Retired from [employer] regularly, rides an exercise bike    Family History  Problem Relation Age of Onset  . Breast cancer Mother   . Hypertension Mother   . Heart disease Mother   . Stroke Mother   . Arthritis Father   . Heart disease Brother   . Stroke Maternal Grandmother   . Heart disease Maternal Grandfather     Review of Systems  Constitutional: Positive for chills. Negative for fever.  Gastrointestinal: Negative for abdominal pain and nausea.  Genitourinary: Positive for dysuria and frequency. Negative for urgency.        Odor  Musculoskeletal: Negative for back pain.       Objective:   Vitals:   08/29/17 1533  BP: 132/82  Pulse: 79  Resp: 16  Temp: 98.3 F (36.8 C)  SpO2: 95%   BP Readings from Last 3 Encounters:  08/29/17 132/82  05/25/17 132/84  01/10/17 120/64   Wt Readings from Last 3 Encounters:  08/29/17 157 lb (71.2 kg)  05/25/17 156 lb (70.8 kg)  01/10/17 154 lb (69.9 kg)   Body mass index is 27.81 kg/m.   Physical Exam  Constitutional: She appears well-developed and well-nourished. No distress.  Abdominal: She exhibits no distension. There is no tenderness. There is no rebound and no guarding.  Genitourinary:  Genitourinary Comments: No CVA tenderness  Skin: Skin is warm and dry. She is not diaphoretic.           Assessment & Plan:    See Problem List for Assessment and Plan of chronic medical problems.

## 2017-08-30 LAB — URINE CULTURE
MICRO NUMBER: 90460489
SPECIMEN QUALITY:: ADEQUATE

## 2017-09-05 ENCOUNTER — Other Ambulatory Visit: Payer: Self-pay | Admitting: Internal Medicine

## 2017-09-05 DIAGNOSIS — Z1231 Encounter for screening mammogram for malignant neoplasm of breast: Secondary | ICD-10-CM

## 2017-10-17 ENCOUNTER — Ambulatory Visit
Admission: RE | Admit: 2017-10-17 | Discharge: 2017-10-17 | Disposition: A | Discharge status: Home / Self Care | Payer: Medicare Other | Source: Ambulatory Visit | Attending: Internal Medicine | Admitting: Internal Medicine

## 2017-10-17 DIAGNOSIS — Z1231 Encounter for screening mammogram for malignant neoplasm of breast: Secondary | ICD-10-CM

## 2018-01-12 ENCOUNTER — Encounter: Payer: Self-pay | Admitting: Cardiology

## 2018-01-12 ENCOUNTER — Ambulatory Visit: Payer: Medicare Other | Admitting: Cardiology

## 2018-01-12 ENCOUNTER — Encounter (INDEPENDENT_AMBULATORY_CARE_PROVIDER_SITE_OTHER): Payer: Self-pay

## 2018-01-12 VITALS — BP 140/90 | HR 66 | Ht 63.0 in | Wt 155.8 lb

## 2018-01-12 DIAGNOSIS — I10 Essential (primary) hypertension: Secondary | ICD-10-CM

## 2018-01-12 DIAGNOSIS — I2510 Atherosclerotic heart disease of native coronary artery without angina pectoris: Secondary | ICD-10-CM

## 2018-01-12 DIAGNOSIS — E782 Mixed hyperlipidemia: Secondary | ICD-10-CM

## 2018-01-12 NOTE — Progress Notes (Signed)
Cardiology Office Note   Date:  01/12/2018   ID:  Sarah Hendricks, DOB Sep 13, 1945, MRN [id number]  PCP:  Binnie Rail, MD  Cardiologist:   Ena Dawley, MD    Chief complain: 1 year follow up   History of Present Illness: Sarah Hendricks is a 72 y.o. female with history of hypertension, hyperlipidemia, CAD with stent placement in 2008, and breast cancer status post lumpectomy and XRT but no chemotherapy. 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  She states that her BP is elevated only at the doctors offices. However at the recent doctors visit her BP was 198/102, on another visit it was 220/114.  01/10/17  - 9 months follow up, she feels looks great, she walks daily and works at her backyard, and denies symptoms of palpitations, orthopnea, PND, lower extremity edema, claudication, dizziness, presyncope, syncope, bleeding. She is tolerating atorvastatin, denies muscle pain.  01/11/2018 this is 1 year follow-up, the patient feels great, she continues to work intensively in her yard, she has no chest pain or shortness of breath with that, she is tolerating her medications very well.  She denies any lower extremity edema.  She is concerned about borderline hemoglobin A1c earlier this year, she describes her diet as able to eat whatever she wants but not limiting herself with carbohydrate intake.  Drinks only plain water.  Past Medical History:  Diagnosis Date  . Breast cancer ([place]) 1994   lumpectomy and XRT, no chemo  . CAD (coronary artery disease) 2008   PTCA, in [place]  . GERD (gastroesophageal reflux disease)   . History of chicken pox   . Hyperlipidemia   . Hypertension   . Vitamin D deficiency    Past Surgical History:  Procedure Laterality Date  . BREAST BIOPSY Left 1994  . BREAST LUMPECTOMY Left 1994  . CRANIECTOMY FOR DEPRESSED SKULL FRACTURE  1986  . HEART ATTACK   2008   STENT PLACED  . TONSILLECTOMY  1966     Current Outpatient Medications  Medication Sig Dispense Refill  . aspirin 81 MG tablet Take 81 mg by mouth daily.    Marland Kitchen atorvastatin (LIPITOR) 20 MG tablet Take 1 tablet (20 mg total) by mouth daily. 90 tablet 3  . calcium carbonate (OS-CAL) 600 MG TABS tablet Take 600 mg by mouth 2 (two) times daily with a meal.     . carvedilol (COREG) 6.25 MG tablet Take 1 tablet (6.25 mg total) by mouth 2 (two) times daily. 180 tablet 3  . Cholecalciferol (EQL VITAMIN D3) 1000 units tablet Take 1 tablet (1,000 Units total) by mouth daily.    . Omega-3 Fatty Acids (FISH OIL) 1200 MG CAPS Take by mouth daily.    Marland Kitchen sulfamethoxazole-trimethoprim (BACTRIM DS,SEPTRA DS) 800-160 MG tablet Take 1 tablet by mouth 2 (two) times daily. 10 tablet 0   No current facility-administered medications for this visit.     Allergies:   Patient has no allergy information on record.   Social History:  The patient  reports that she has never smoked. She has never used smokeless tobacco. She reports that she does not drink alcohol or use drugs.   Family History:  The patient's family history includes Arthritis in her father; Breast cancer in her mother; Heart disease in her brother, maternal grandfather, and mother; Hypertension in her mother; Stroke in her maternal grandmother and mother.  ROS:  Please see the history of present illness.   Otherwise, review of systems is positive for none.   All other systems are reviewed and negative.    PHYSICAL EXAM: VS:  BP 140/90   Pulse 66   Ht 5\' 3"  (1.6 m)   Wt 155 lb 12.8 oz (70.7 kg)   SpO2 97%   BMI 27.60 kg/m  , BMI Body mass index is 27.6 kg/m. GEN: Well nourished, well developed, in no acute distress  HEENT: normal  Neck: no JVD, carotid bruits, or masses Cardiac: RRR; no murmurs, rubs, or gallops,no edema  Respiratory:  clear to auscultation bilaterally, normal work of breathing GI: soft, nontender, nondistended, +  BS MS: no deformity or atrophy  Skin: warm and dry Neuro:  Strength and sensation are intact Psych: euthymic mood, full affect  EKG:  EKG Was ordered today and was personally reviewed, shows sinus rhythm with PACs unchanged from prior.  Recent Labs: 05/25/2017: ALT 18; BUN 13; Creatinine, Ser 0.79; Hemoglobin 14.6; Platelets 275.0; Potassium 4.1; Sodium 137; TSH 1.62    Lipid Panel     Component Value Date/Time   CHOL 152 05/25/2017 0919   TRIG 194.0 (H) 05/25/2017 0919   HDL 44.40 05/25/2017 0919   CHOLHDL 3 05/25/2017 0919   VLDL 38.8 05/25/2017 0919   LDLCALC 69 05/25/2017 0919   LDLDIRECT 97.0 06/04/2015 0928   Wt Readings from Last 3 Encounters:  01/12/18 155 lb 12.8 oz (70.7 kg)  08/29/17 157 lb (71.2 kg)  05/25/17 156 lb (70.8 kg)    Other studies Reviewed: Additional studies/ records that were reviewed today include: Epic notes  EKG performed January 11, 2018 shows normal sinus rhythm, possible prior anterior infarct age undetermined, unchanged from prior, this was personally reviewed.  ASSESSMENT AND PLAN:  1.  Coronary artery disease: Currently taking aspirin, atorvastatin and metoprolol. Asymptomatic. She underwent exercise treadmill stress test in January 2018 that was negative for ischemia and showed normal blood pressure response to exertion.  We will continue the same management.  EKG today unchanged from prior.  2. Hypertension: Repeated blood pressure 130/82.  3.  Hyperlipidemia: On atorvastatin 20 mg daily and fish oil. LDL and HDL at goal, triglycerides elevated, along with elevated hemoglobin A1c, advised about limiting carbohydrates and sweets and replacing them for vegetables.  Current medicines are reviewed at length with the patient today.  Labs reviewed from her primary care physician in January 2019.  Labs/ tests ordered today include:  Orders Placed This Encounter  Procedures  . EKG 12-Lead   Disposition:   FU in 1 year. Her labs will be checked  by her primary care physician in January 2019.  Signed, Ena Dawley, MD  01/12/2018 9:45 AM     [facility] [address] [place] [address] [phone] (office) [phone] (fax)

## 2018-01-12 NOTE — Patient Instructions (Signed)
Medication Instructions:   Your physician recommends that you continue on your current medications as directed. Please refer to the Current Medication list given to you today.    Follow-Up:  Your physician wants you to follow-up in: [place] will receive a reminder letter in the mail two months in advance. If you don't receive a letter, please call our office to schedule the follow-up appointment.        If you need a refill on your cardiac medications before your next appointment, please call your pharmacy.

## 2018-02-13 ENCOUNTER — Ambulatory Visit (INDEPENDENT_AMBULATORY_CARE_PROVIDER_SITE_OTHER): Payer: Medicare Other

## 2018-02-13 DIAGNOSIS — Z23 Encounter for immunization: Secondary | ICD-10-CM

## 2018-06-11 NOTE — Progress Notes (Signed)
Subjective:    Patient ID: Sarah Hendricks, female    DOB: 02-Jun-1945, 73 y.o.   MRN: [id number]  HPI She is here for a physical exam.   She feels well and has no concerns.  She denies any changes in her health since she was here last.     Medications and allergies reviewed with patient and updated if appropriate.  Patient Active Problem List   Diagnosis Date Noted  . Prediabetes 05/26/2017  . Heel pain 10/20/2015  . Osteopenia 05/30/2014  . CAD (coronary artery disease)   . Hypertension   . Hyperlipidemia   . History of breast cancer   . Vitamin D deficiency     Current Outpatient Medications on File Prior to Visit  Medication Sig Dispense Refill  . aspirin 81 MG tablet Take 81 mg by mouth daily.    . calcium carbonate (OS-CAL) 600 MG TABS tablet Take 600 mg by mouth 2 (two) times daily with a meal.     . Cholecalciferol (EQL VITAMIN D3) 1000 units tablet Take 1 tablet (1,000 Units total) by mouth daily.    . Omega-3 Fatty Acids (FISH OIL) 1200 MG CAPS Take by mouth daily.     No current facility-administered medications on file prior to visit.     Past Medical History:  Diagnosis Date  . Breast cancer ([place]) 1994   lumpectomy and XRT, no chemo  . CAD (coronary artery disease) 2008   PTCA, in [place]  . GERD (gastroesophageal reflux disease)   . History of chicken pox   . Hyperlipidemia   . Hypertension   . Vitamin D deficiency     Past Surgical History:  Procedure Laterality Date  . BREAST BIOPSY Left 1994  . BREAST LUMPECTOMY Left 1994  . CRANIECTOMY FOR DEPRESSED SKULL FRACTURE  1986  . HEART ATTACK  2008   STENT PLACED  . TONSILLECTOMY  1966    Social History   Socioeconomic History  . Marital status: Married    Spouse name: Not on file  . Number of children: Not on file  . Years of education: Not on file  . Highest education level: Not on file  Occupational History  . Not on file  Social Needs  . Financial resource strain: Not on file  . Food  insecurity:    Worry: Not on file    Inability: Not on file  . Transportation needs:    Medical: Not on file    Non-medical: Not on file  Tobacco Use  . Smoking status: Never Smoker  . Smokeless tobacco: Never Used  Substance and Sexual Activity  . Alcohol use: No  . Drug use: No  . Sexual activity: Not on file  Lifestyle  . Physical activity:    Days per week: Not on file    Minutes per session: Not on file  . Stress: Not on file  Relationships  . Social connections:    Talks on phone: Not on file    Gets together: Not on file    Attends religious service: Not on file    Active member of club or organization: Not on file    Attends meetings of clubs or organizations: Not on file    Relationship status: Not on file  Other Topics Concern  . Not on file  Social History Narrative   Married, lives with spouse   Retired from [employer] irregularly, rides an exercise bike    Family  History  Problem Relation Age of Onset  . Breast cancer Mother   . Hypertension Mother   . Heart disease Mother   . Stroke Mother   . Arthritis Father   . Heart disease Brother   . Stroke Maternal Grandmother   . Heart disease Maternal Grandfather     Review of Systems  Constitutional: Negative for chills and fever.  HENT: Positive for postnasal drip.   Eyes: Negative for visual disturbance.  Respiratory: Negative for cough, shortness of breath and wheezing.   Cardiovascular: Negative for chest pain, palpitations and leg swelling.  Gastrointestinal: Negative for abdominal pain, blood in stool, constipation, diarrhea and nausea.       No gerd  Genitourinary: Negative for dysuria and hematuria.  Musculoskeletal: Negative for arthralgias and back pain.  Skin: Negative for color change and rash.  Neurological: Negative for dizziness, light-headedness, numbness and headaches.  Psychiatric/Behavioral: Negative for dysphoric mood. The patient is not nervous/anxious.          Objective:   Vitals:   06/12/18 0856  BP: 140/88  Pulse: 61  Resp: 16  Temp: 98.5 F (36.9 C)  SpO2: 98%   Filed Weights   06/12/18 0856  Weight: 153 lb 12.8 oz (69.8 kg)   Body mass index is 27.24 kg/m.  BP Readings from Last 3 Encounters:  06/12/18 140/88  01/12/18 140/90  08/29/17 132/82    Wt Readings from Last 3 Encounters:  06/12/18 153 lb 12.8 oz (69.8 kg)  01/12/18 155 lb 12.8 oz (70.7 kg)  08/29/17 157 lb (71.2 kg)     Physical Exam Constitutional: She appears well-developed and well-nourished. No distress.  HENT:  Head: Normocephalic and atraumatic.  Right Ear: External ear normal. Normal ear canal and TM Left Ear: External ear normal.  Normal ear canal and TM Mouth/Throat: Oropharynx is clear and moist.  Eyes: Conjunctivae and EOM are normal.  Neck: Neck supple. No tracheal deviation present. No thyromegaly present.  No carotid bruit  Cardiovascular: Normal rate, regular rhythm and normal heart sounds.   No murmur heard.  No edema. Pulmonary/Chest: Effort normal and breath sounds normal. No respiratory distress. She has no wheezes. She has no rales.  Breast: deferred  Abdominal: Soft. She exhibits no distension. There is no tenderness.  Lymphadenopathy: She has no cervical adenopathy.  Skin: Skin is warm and dry. She is not diaphoretic.  Psychiatric: She has a normal mood and affect. Her behavior is normal.        Assessment & Plan:   Physical exam: Screening blood work   ordered Immunizations   Discussed shingrix, others up to date Colonoscopy  Due this month - she will consider, but not sure if she wants to have another Mammogram   Up to date  Dexa   Up to date  Eye exams   Up to date  EKG     12/2017 Exercise  No regular exercise - encouraged regular exercise Weight  BMI ok for age Skin   No concerns Substance abuse    none  See Problem List for Assessment and Plan of chronic medical problems.   FU in one year

## 2018-06-11 NOTE — Patient Instructions (Addendum)
Tests ordered today. Your results will be released to [facility] (or called to you) after review, usually within 72hours after test completion. If any changes need to be made, you will be notified at that same time.  All other Health Maintenance issues reviewed.   All recommended immunizations and age-appropriate screenings are up-to-date or discussed.  No immunizations administered today.   Medications reviewed and updated.  Changes include :   none   Please followup in  One year   Health Maintenance, Female Adopting a healthy lifestyle and getting preventive care can go a long way to promote health and wellness. Talk with your health care provider about what schedule of regular examinations is right for you. This is a good chance for you to check in with your provider about disease prevention and staying healthy. In between checkups, there are plenty of things you can do on your own. Experts have done a lot of research about which lifestyle changes and preventive measures are most likely to keep you healthy. Ask your health care provider for more information. Weight and diet Eat a healthy diet  Be sure to include plenty of vegetables, fruits, low-fat dairy products, and lean protein.  Do not eat a lot of foods high in solid fats, added sugars, or salt.  Get regular exercise. This is one of the most important things you can do for your health. ? Most adults should exercise for at least 150 minutes each week. The exercise should increase your heart rate and make you sweat (moderate-intensity exercise). ? Most adults should also do strengthening exercises at least twice a week. This is in addition to the moderate-intensity exercise. Maintain a healthy weight  Body mass index (BMI) is a measurement that can be used to identify possible weight problems. It estimates body fat based on height and weight. Your health care provider can help determine your BMI and help you achieve or maintain a  healthy weight.  For females 30 years of age and older: ? A BMI below 18.5 is considered underweight. ? A BMI of 18.5 to 24.9 is normal. ? A BMI of 25 to 29.9 is considered overweight. ? A BMI of 30 and above is considered obese. Watch levels of cholesterol and blood lipids  You should start having your blood tested for lipids and cholesterol at 72 years of age, then have this test every 5 years.  You may need to have your cholesterol levels checked more often if: ? Your lipid or cholesterol levels are high. ? You are older than 72 years of age. ? You are at high risk for heart disease. Cancer screening Lung Cancer  Lung cancer screening is recommended for adults 28-23 years old who are at high risk for lung cancer because of a history of smoking.  A yearly low-dose CT scan of the lungs is recommended for people who: ? Currently smoke. ? Have quit within the past 15 years. ? Have at least a 30-pack-year history of smoking. A pack year is smoking an average of one pack of cigarettes a day for 1 year.  Yearly screening should continue until it has been 15 years since you quit.  Yearly screening should stop if you develop a health problem that would prevent you from having lung cancer treatment. Breast Cancer  Practice breast self-awareness. This means understanding how your breasts normally appear and feel.  It also means doing regular breast self-exams. Let your health care provider know about any changes, no matter how  small.  If you are in your 20s or 30s, you should have a clinical breast exam (CBE) by a health care provider every 1-3 years as part of a regular health exam.  If you are 40 or older, have a CBE every year. Also consider having a breast X-ray (mammogram) every year.  If you have a family history of breast cancer, talk to your health care provider about genetic screening.  If you are at high risk for breast cancer, talk to your health care provider about having  an MRI and a mammogram every year.  Breast cancer gene (BRCA) assessment is recommended for women who have family members with BRCA-related cancers. BRCA-related cancers include: ? Breast. ? Ovarian. ? Tubal. ? Peritoneal cancers.  Results of the assessment will determine the need for genetic counseling and BRCA1 and BRCA2 testing. Cervical Cancer Your health care provider may recommend that you be screened regularly for cancer of the pelvic organs (ovaries, uterus, and vagina). This screening involves a pelvic examination, including checking for microscopic changes to the surface of your cervix (Pap test). You may be encouraged to have this screening done every 3 years, beginning at age 67.  For women ages 65-65, health care providers may recommend pelvic exams and Pap testing every 3 years, or they may recommend the Pap and pelvic exam, combined with testing for human papilloma virus (HPV), every 5 years. Some types of HPV increase your risk of cervical cancer. Testing for HPV may also be done on women of any age with unclear Pap test results.  Other health care providers may not recommend any screening for nonpregnant women who are considered low risk for pelvic cancer and who do not have symptoms. Ask your health care provider if a screening pelvic exam is right for you.  If you have had past treatment for cervical cancer or a condition that could lead to cancer, you need Pap tests and screening for cancer for at least 20 years after your treatment. If Pap tests have been discontinued, your risk factors (such as having a new sexual partner) need to be reassessed to determine if screening should resume. Some women have medical problems that increase the chance of getting cervical cancer. In these cases, your health care provider may recommend more frequent screening and Pap tests. Colorectal Cancer  This type of cancer can be detected and often prevented.  Routine colorectal cancer screening  usually begins at 72 years of age and continues through 72 years of age.  Your health care provider may recommend screening at an earlier age if you have risk factors for colon cancer.  Your health care provider may also recommend using home test kits to check for hidden blood in the stool.  A small camera at the end of a tube can be used to examine your colon directly (sigmoidoscopy or colonoscopy). This is done to check for the earliest forms of colorectal cancer.  Routine screening usually begins at age 32.  Direct examination of the colon should be repeated every 5-10 years through 72 years of age. However, you may need to be screened more often if early forms of precancerous polyps or small growths are found. Skin Cancer  Check your skin from head to toe regularly.  Tell your health care provider about any new moles or changes in moles, especially if there is a change in a mole's shape or color.  Also tell your health care provider if you have a mole that is larger  size of a pencil eraser.  Always use sunscreen. Apply sunscreen liberally and repeatedly throughout the day.  Protect yourself by wearing long sleeves, pants, a wide-brimmed hat, and sunglasses whenever you are outside. Heart disease, diabetes, and high blood pressure  High blood pressure causes heart disease and increases the risk of stroke. High blood pressure is more likely to develop in: ? People who have blood pressure in the high end of the normal range (130-139/85-89 mm Hg). ? People who are overweight or obese. ? People who are African American.  If you are 18-39 years of age, have your blood pressure checked every 3-5 years. If you are 40 years of age or older, have your blood pressure checked every year. You should have your blood pressure measured twice-once when you are at a hospital or clinic, and once when you are not at a hospital or clinic. Record the average of the two measurements. To check your  blood pressure when you are not at a hospital or clinic, you can use: ? An automated blood pressure machine at a pharmacy. ? A home blood pressure monitor.  If you are between 55 years and 79 years old, ask your health care provider if you should take aspirin to prevent strokes.  Have regular diabetes screenings. This involves taking a blood sample to check your fasting blood sugar level. ? If you are at a normal weight and have a low risk for diabetes, have this test once every three years after 72 years of age. ? If you are overweight and have a high risk for diabetes, consider being tested at a younger age or more often. Preventing infection Hepatitis B  If you have a higher risk for hepatitis B, you should be screened for this virus. You are considered at high risk for hepatitis B if: ? You were born in a country where hepatitis B is common. Ask your health care provider which countries are considered high risk. ? Your parents were born in a high-risk country, and you have not been immunized against hepatitis B (hepatitis B vaccine). ? You have HIV or AIDS. ? You use needles to inject street drugs. ? You live with someone who has hepatitis B. ? You have had sex with someone who has hepatitis B. ? You get hemodialysis treatment. ? You take certain medicines for conditions, including cancer, organ transplantation, and autoimmune conditions. Hepatitis C  Blood testing is recommended for: ? Everyone born from 1945 through 1965. ? Anyone with known risk factors for hepatitis C. Sexually transmitted infections (STIs)  You should be screened for sexually transmitted infections (STIs) including gonorrhea and chlamydia if: ? You are sexually active and are younger than 72 years of age. ? You are older than 72 years of age and your health care provider tells you that you are at risk for this type of infection. ? Your sexual activity has changed since you were last screened and you are at an  increased risk for chlamydia or gonorrhea. Ask your health care provider if you are at risk.  If you do not have HIV, but are at risk, it may be recommended that you take a prescription medicine daily to prevent HIV infection. This is called pre-exposure prophylaxis (PrEP). You are considered at risk if: ? You are sexually active and do not regularly use condoms or know the HIV status of your partner(s). ? You take drugs by injection. ? You are sexually active with a partner who has HIV.   has HIV. Talk with your health care provider about whether you are at high risk of being infected with HIV. If you choose to begin PrEP, you should first be tested for HIV. You should then be tested every 3 months for as long as you are taking PrEP. Pregnancy  If you are premenopausal and you may become pregnant, ask your health care provider about preconception counseling.  If you may become pregnant, take 400 to 800 micrograms (mcg) of folic acid every day.  If you want to prevent pregnancy, talk to your health care provider about birth control (contraception). Osteoporosis and menopause  Osteoporosis is a disease in which the bones lose minerals and strength with aging. This can result in serious bone fractures. Your risk for osteoporosis can be identified using a bone density scan.  If you are 24 years of age or older, or if you are at risk for osteoporosis and fractures, ask your health care provider if you should be screened.  Ask your health care provider whether you should take a calcium or vitamin D supplement to lower your risk for osteoporosis.  Menopause may have certain physical symptoms and risks.  Hormone replacement therapy may reduce some of these symptoms and risks. Talk to your health care provider about whether hormone replacement therapy is right for you. Follow these instructions at home:  Schedule regular health, dental, and eye exams.  Stay current with your immunizations.  Do not use  any tobacco products including cigarettes, chewing tobacco, or electronic cigarettes.  If you are pregnant, do not drink alcohol.  If you are breastfeeding, limit how much and how often you drink alcohol.  Limit alcohol intake to no more than 1 drink per day for nonpregnant women. One drink equals 12 ounces of beer, 5 ounces of wine, or 1 ounces of hard liquor.  Do not use street drugs.  Do not share needles.  Ask your health care provider for help if you need support or information about quitting drugs.  Tell your health care provider if you often feel depressed.  Tell your health care provider if you have ever been abused or do not feel safe at home. This information is not intended to replace advice given to you by your health care provider. Make sure you discuss any questions you have with your health care provider. Document Released: 11/16/2010 Document Revised: 10/09/2015 Document Reviewed: 02/04/2015 Elsevier Interactive Patient Education  2019 [employer].

## 2018-06-12 ENCOUNTER — Other Ambulatory Visit (INDEPENDENT_AMBULATORY_CARE_PROVIDER_SITE_OTHER): Payer: Medicare Other

## 2018-06-12 ENCOUNTER — Encounter: Payer: Self-pay | Admitting: Internal Medicine

## 2018-06-12 ENCOUNTER — Ambulatory Visit (INDEPENDENT_AMBULATORY_CARE_PROVIDER_SITE_OTHER): Payer: Medicare Other | Admitting: Internal Medicine

## 2018-06-12 VITALS — BP 140/88 | HR 61 | Temp 98.5°F | Resp 16 | Ht 63.0 in | Wt 153.8 lb

## 2018-06-12 DIAGNOSIS — I2510 Atherosclerotic heart disease of native coronary artery without angina pectoris: Secondary | ICD-10-CM | POA: Diagnosis not present

## 2018-06-12 DIAGNOSIS — M85862 Other specified disorders of bone density and structure, left lower leg: Secondary | ICD-10-CM

## 2018-06-12 DIAGNOSIS — R7303 Prediabetes: Secondary | ICD-10-CM

## 2018-06-12 DIAGNOSIS — Z0000 Encounter for general adult medical examination without abnormal findings: Secondary | ICD-10-CM | POA: Diagnosis not present

## 2018-06-12 DIAGNOSIS — I10 Essential (primary) hypertension: Secondary | ICD-10-CM

## 2018-06-12 DIAGNOSIS — E7849 Other hyperlipidemia: Secondary | ICD-10-CM

## 2018-06-12 LAB — CBC WITH DIFFERENTIAL/PLATELET
BASOS ABS: 0.1 10*3/uL (ref 0.0–0.1)
BASOS PCT: 1.1 % (ref 0.0–3.0)
EOS ABS: 0.3 10*3/uL (ref 0.0–0.7)
Eosinophils Relative: 4.7 % (ref 0.0–5.0)
HCT: 43.2 % (ref 36.0–46.0)
Hemoglobin: 14.9 g/dL (ref 12.0–15.0)
Lymphocytes Relative: 28.8 % (ref 12.0–46.0)
Lymphs Abs: 2.1 10*3/uL (ref 0.7–4.0)
MCHC: 34.5 g/dL (ref 30.0–36.0)
MCV: 93 fl (ref 78.0–100.0)
MONO ABS: 0.6 10*3/uL (ref 0.1–1.0)
Monocytes Relative: 8.7 % (ref 3.0–12.0)
NEUTROS ABS: 4.1 10*3/uL (ref 1.4–7.7)
Neutrophils Relative %: 56.7 % (ref 43.0–77.0)
PLATELETS: 266 10*3/uL (ref 150.0–400.0)
RBC: 4.64 Mil/uL (ref 3.87–5.11)
RDW: 12.9 % (ref 11.5–15.5)
WBC: 7.2 10*3/uL (ref 4.0–10.5)

## 2018-06-12 LAB — COMPREHENSIVE METABOLIC PANEL
ALT: 13 U/L (ref 0–35)
AST: 14 U/L (ref 0–37)
Albumin: 4.3 g/dL (ref 3.5–5.2)
Alkaline Phosphatase: 86 U/L (ref 39–117)
BILIRUBIN TOTAL: 0.7 mg/dL (ref 0.2–1.2)
BUN: 9 mg/dL (ref 6–23)
CHLORIDE: 101 meq/L (ref 96–112)
CO2: 23 meq/L (ref 19–32)
Calcium: 9.6 mg/dL (ref 8.4–10.5)
Creatinine, Ser: 0.79 mg/dL (ref 0.40–1.20)
GFR: 71.68 mL/min (ref >60.00)
GLUCOSE: 101 mg/dL — AB (ref 70–99)
Potassium: 4 mEq/L (ref 3.5–5.1)
SODIUM: 137 meq/L (ref 135–145)
Total Protein: 6.8 g/dL (ref 6.0–8.3)

## 2018-06-12 LAB — LIPID PANEL
Cholesterol: 158 mg/dL (ref 0–200)
HDL: 44.1 mg/dL (ref >39.00)
NONHDL: 113.89
Total CHOL/HDL Ratio: 4
Triglycerides: 204 mg/dL — ABNORMAL HIGH (ref 0.0–149.0)
VLDL: 40.8 mg/dL — ABNORMAL HIGH (ref 0.0–40.0)

## 2018-06-12 LAB — TSH: TSH: 1.45 u[IU]/mL (ref 0.35–4.50)

## 2018-06-12 LAB — LDL CHOLESTEROL, DIRECT: Direct LDL: 87 mg/dL

## 2018-06-12 LAB — HEMOGLOBIN A1C: HEMOGLOBIN A1C: 5.7 % (ref 4.6–6.5)

## 2018-06-12 MED ORDER — CARVEDILOL 6.25 MG PO TABS: 6.2500 mg | ORAL_TABLET | Freq: Two times a day (BID) | ORAL | 3 refills | Status: DC

## 2018-06-12 MED ORDER — ATORVASTATIN CALCIUM 20 MG PO TABS: 20.0000 mg | ORAL_TABLET | Freq: Every day | ORAL | 3 refills | Status: DC

## 2018-06-12 NOTE — Assessment & Plan Note (Signed)
No chest pain, palps, SOB Stressed regular exercise Cbc, cmp. Lipid, tsh Continue asa 81, statin, coreg

## 2018-06-12 NOTE — Assessment & Plan Note (Signed)
BP controlled Current regimen effective and well tolerated Continue current medications at current doses cmp 

## 2018-06-12 NOTE — Assessment & Plan Note (Signed)
Check a1c Low sugar / carb diet Stressed regular exercise   

## 2018-06-12 NOTE — Assessment & Plan Note (Signed)
Check lipid panel  Continue daily statin Regular exercise and healthy diet encouraged  

## 2018-06-12 NOTE — Assessment & Plan Note (Signed)
Stressed regular exercise Continue cal/vitamin d daily dexa up to date

## 2018-06-14 ENCOUNTER — Other Ambulatory Visit: Payer: Self-pay | Admitting: Internal Medicine

## 2018-07-24 DIAGNOSIS — H04123 Dry eye syndrome of bilateral lacrimal glands: Secondary | ICD-10-CM | POA: Diagnosis not present

## 2018-07-24 DIAGNOSIS — H02831 Dermatochalasis of right upper eyelid: Secondary | ICD-10-CM | POA: Diagnosis not present

## 2018-07-24 DIAGNOSIS — H2513 Age-related nuclear cataract, bilateral: Secondary | ICD-10-CM | POA: Diagnosis not present

## 2018-07-31 ENCOUNTER — Other Ambulatory Visit: Payer: Self-pay | Admitting: Internal Medicine

## 2018-09-07 ENCOUNTER — Other Ambulatory Visit: Payer: Self-pay | Admitting: Internal Medicine

## 2018-09-07 DIAGNOSIS — Z1231 Encounter for screening mammogram for malignant neoplasm of breast: Secondary | ICD-10-CM

## 2018-11-02 ENCOUNTER — Other Ambulatory Visit: Payer: Self-pay

## 2018-11-02 ENCOUNTER — Ambulatory Visit
Admission: RE | Admit: 2018-11-02 | Discharge: 2018-11-02 | Disposition: A | Discharge status: Home / Self Care | Payer: Medicare Other | Source: Ambulatory Visit | Attending: Internal Medicine | Admitting: Internal Medicine

## 2018-11-02 DIAGNOSIS — Z1231 Encounter for screening mammogram for malignant neoplasm of breast: Secondary | ICD-10-CM | POA: Diagnosis not present

## 2019-01-30 DIAGNOSIS — Z0120 Encounter for dental examination and cleaning without abnormal findings: Secondary | ICD-10-CM | POA: Diagnosis not present

## 2019-02-15 ENCOUNTER — Other Ambulatory Visit: Payer: Self-pay

## 2019-02-15 ENCOUNTER — Ambulatory Visit (INDEPENDENT_AMBULATORY_CARE_PROVIDER_SITE_OTHER): Payer: Medicare Other

## 2019-02-15 DIAGNOSIS — Z23 Encounter for immunization: Secondary | ICD-10-CM | POA: Diagnosis not present

## 2019-03-12 ENCOUNTER — Encounter: Payer: Self-pay | Admitting: Cardiology

## 2019-03-13 ENCOUNTER — Encounter (INDEPENDENT_AMBULATORY_CARE_PROVIDER_SITE_OTHER): Payer: Self-pay

## 2019-03-13 ENCOUNTER — Ambulatory Visit: Payer: Medicare Other | Admitting: Cardiology

## 2019-03-13 ENCOUNTER — Encounter: Payer: Self-pay | Admitting: Cardiology

## 2019-03-13 ENCOUNTER — Other Ambulatory Visit: Payer: Self-pay

## 2019-03-13 ENCOUNTER — Telehealth: Payer: Self-pay | Admitting: Cardiology

## 2019-03-13 VITALS — BP 146/92 | HR 71 | Ht 63.0 in | Wt 159.8 lb

## 2019-03-13 DIAGNOSIS — E782 Mixed hyperlipidemia: Secondary | ICD-10-CM | POA: Diagnosis not present

## 2019-03-13 DIAGNOSIS — I2510 Atherosclerotic heart disease of native coronary artery without angina pectoris: Secondary | ICD-10-CM | POA: Diagnosis not present

## 2019-03-13 DIAGNOSIS — I10 Essential (primary) hypertension: Secondary | ICD-10-CM

## 2019-03-13 MED ORDER — CARVEDILOL 12.5 MG PO TABS: 12.5000 mg | ORAL_TABLET | Freq: Two times a day (BID) | ORAL | 3 refills | Status: DC

## 2019-03-13 NOTE — Progress Notes (Signed)
Cardiology Office Note   Date:  03/13/2019   ID:  Gensis Trakas, DOB [birth year], MRN [id number]  PCP:  Binnie Rail, MD  Cardiologist:   Ena Dawley, MD    Chief complain: 1 year follow up   History of Present Illness: Sarah Hendricks is a 73 y.o. female with history of hypertension, hyperlipidemia, CAD with stent placement in 2008, and breast cancer status post lumpectomy and XRT but no chemotherapy. 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  She states that her BP is elevated only at the doctors offices. However at the recent doctors visit her BP was 198/102, on another visit it was 220/114.  01/11/2018 this is 1 year follow-up, the patient feels great, she continues to work intensively in her yard, she has no chest pain or shortness of breath with that, she is tolerating her medications very well.  She denies any lower extremity edema.  She is concerned about borderline hemoglobin A1c earlier this year, she describes her diet as able to eat whatever she wants but not limiting herself with carbohydrate intake.  Drinks only plain water.  03/12/2019 -the patient is coming after 1 year, she has been doing great, denies any chest pain or shortness of breath, she is not walking anymore because her husband is unable to and they always want to get her.  She has gained 4 pounds.  She denies any lower extremity edema orthopnea proximal nocturnal dyspnea.  Past Medical History:  Diagnosis Date  . Breast cancer ([place]) 1994   lumpectomy and XRT, no chemo  . CAD (coronary artery disease) 2008   PTCA, in [place]  . GERD (gastroesophageal reflux disease)   . History of chicken pox   . Hyperlipidemia   . Hypertension   . Vitamin D deficiency    Past Surgical History:  Procedure Laterality Date  . BREAST BIOPSY Left 1994  . BREAST LUMPECTOMY Left 1994  . CRANIECTOMY FOR DEPRESSED SKULL  FRACTURE  1986  . HEART ATTACK  2008   STENT PLACED  . TONSILLECTOMY  1966     Current Outpatient Medications  Medication Sig Dispense Refill  . aspirin 81 MG tablet Take 81 mg by mouth daily.    Marland Kitchen atorvastatin (LIPITOR) 20 MG tablet TAKE 1 TABLET BY MOUTH DAILY 90 tablet 3  . calcium carbonate (OS-CAL) 600 MG TABS tablet Take 600 mg by mouth 2 (two) times daily with a meal.     . carvedilol (COREG) 6.25 MG tablet TAKE 1 TABLET(6.25 MG) BY MOUTH TWICE DAILY 180 tablet 3  . Cholecalciferol (EQL VITAMIN D3) 1000 units tablet Take 1 tablet (1,000 Units total) by mouth daily.    . Omega-3 Fatty Acids (FISH OIL) 1200 MG CAPS Take by mouth daily.     No current facility-administered medications for this visit.     Allergies:   Acetaminophen-codeine, Tigan [trimethobenzamide], and Codeine   Social History:  The patient  reports that she has never smoked. She has never used smokeless tobacco. She reports that she does not drink alcohol or use drugs.   Family History:  The patient's family history includes Arthritis in her father; Breast cancer in her mother; Heart disease in her brother, maternal grandfather, and mother; Hypertension in her mother; Stroke in her maternal grandmother and mother.    ROS:  Please see the history of present illness.   Otherwise, review  of systems is positive for none.   All other systems are reviewed and negative.    PHYSICAL EXAM: VS:  BP (!) 146/92   Pulse 71   Ht 5\' 3"  (1.6 m)   Wt 159 lb 12.8 oz (72.5 kg)   SpO2 98%   BMI 28.31 kg/m  , BMI Body mass index is 28.31 kg/m. GEN: Well nourished, well developed, in no acute distress  HEENT: normal  Neck: no JVD, carotid bruits, or masses Cardiac: RRR; no murmurs, rubs, or gallops,no edema  Respiratory:  clear to auscultation bilaterally, normal work of breathing GI: soft, nontender, nondistended, + BS MS: no deformity or atrophy  Skin: warm and dry Neuro:  Strength and sensation are intact Psych:  euthymic mood, full affect  EKG:  EKG Was ordered today and was personally reviewed, shows sinus rhythm with PACs unchanged from prior.  Recent Labs: 06/12/2018: ALT 13; BUN 9; Creatinine, Ser 0.79; Hemoglobin 14.9; Platelets 266.0; Potassium 4.0; Sodium 137; TSH 1.45    Lipid Panel     Component Value Date/Time   CHOL 158 06/12/2018 0946   TRIG 204.0 (H) 06/12/2018 0946   HDL 44.10 06/12/2018 0946   CHOLHDL 4 06/12/2018 0946   VLDL 40.8 (H) 06/12/2018 0946   LDLCALC 69 05/25/2017 0919   LDLDIRECT 87.0 06/12/2018 0946   Wt Readings from Last 3 Encounters:  03/13/19 159 lb 12.8 oz (72.5 kg)  06/12/18 153 lb 12.8 oz (69.8 kg)  01/12/18 155 lb 12.8 oz (70.7 kg)    Other studies Reviewed: Additional studies/ records that were reviewed today include: Epic notes  EKG performed today shows normal sinus rhythm with nonspecific ST-T wave abnormalities otherwise normal EKG unchanged from prior.  This was personally reviewed.   ASSESSMENT AND PLAN:  1.  Coronary artery disease: Currently taking aspirin, atorvastatin and metoprolol. Asymptomatic. She underwent exercise treadmill stress test in January 2018 that was negative for ischemia and showed normal blood pressure response to exertion.  She is advised to resumed regular exercise.  EKG today is unchanged from prior and normal.  2. Hypertension: Both systolic and diastolic elevated, I will increase carvedilol 12.5 mg p.o. twice daily, this might be affected by lack of exercise and weight increase.  She is advised to send this blood pressure diary.  3.  Hyperlipidemia: On atorvastatin 20 mg daily and fish oil. LDL and HDL at goal, triglycerides elevated, along with elevated hemoglobin A1c, advised about limiting carbohydrates and sweets and replacing them for vegetables.  Current medicines are reviewed at length with the patient today.  Labs reviewed from her primary care physician in January 2020.  Labs/ tests ordered today include:   No orders of the defined types were placed in this encounter.  Disposition:   FU in 1 year.   Signed, Ena Dawley, MD  03/13/2019 8:36 AM     [address] [address] [phone] (office) [phone] (fax)

## 2019-03-13 NOTE — Telephone Encounter (Signed)
  Patient would like to tell Karlene Einstein that she Lipitor at night and it is 20 mg

## 2019-03-13 NOTE — Telephone Encounter (Signed)
Spoke with the pt and informed her that I got her message and her atorvastatin dose she provided [place], matches her current dose of atorvastatin that we have on file.  Pt verbalized understanding and gracious for all the assistance provided.

## 2019-03-13 NOTE — Patient Instructions (Signed)
Medication Instructions:   INCREASE YOUR CARVEDILOL TO 12.5 MG BY MOUTH TWICE DAILY WITH MEALS  *If you need a refill on your cardiac medications before your next appointment, please call your pharmacy*   Follow-Up: At [facility], you and your health needs are our priority.  As part of our continuing mission to provide you with exceptional heart care, we have created designated Provider Care Teams.  These Care Teams include your primary Cardiologist (physician) and Advanced Practice Providers (APPs -  Physician Assistants and Nurse Practitioners) who all work together to provide you with the care you need, when you need it.  Your next appointment:   12 months  The format for your next appointment:   In Person  Provider:   Ena Dawley, MD  Other Instructions  DR. NELSON WOULD LIKE FOR YOU TO SIGN UP FOR MYCHART.  PLEASE TAKE YOUR BLOOD PRESSURE EVERYDAY, WRITE THIS DOWN FOR 1-2 WEEKS WHILE ON INCREASED DOSE OF CARVEDILOL, THEN SEND [place] YOUR NUMBERS VIA MYCHART OR CALL THEM INTO THE OFFICE TO REPORT TO OUR OPERATORS AT [phone].

## 2019-03-14 ENCOUNTER — Telehealth: Payer: Self-pay | Admitting: Cardiology

## 2019-03-14 NOTE — Telephone Encounter (Signed)
I spoke to the patient and she would like to take 2 (6.25 mg) tablets bid to use up those tablets.  I told her that would be fine and she verbalized understanding.

## 2019-03-14 NOTE — Telephone Encounter (Signed)
New message   Patient wants to know if she can take 2 tablets per day of carvedilol (COREG) 12.5 MG tablet. Please advise.

## 2019-04-02 ENCOUNTER — Telehealth: Payer: Self-pay | Admitting: *Deleted

## 2019-04-02 NOTE — Telephone Encounter (Signed)
Her blood pressure is still elevated, I would add losartan 25 mg daily.

## 2019-04-02 NOTE — Telephone Encounter (Signed)
Pt brought a log of her BP readings to the office to have Dr. Meda Coffee review.  She unfortunately could not get her mychart activated to send these.  Dr. Meda Coffee advised at her last OV on 10/27, to increase her Carvedilol to 12.5 mg bid, record her BP readings for 2 weeks, then report these findings to our office thereafter.  Below are pts recorded BP readings over the course of 2 weeks.  10/27-155/50 at [place] 10/28- 147/73 at 1:15 pm 10/29- 147/76 at 10:00 AM 10/30- 141/78 at 0815 10/31- 143/85 at 0800 11/1- 143/75 at 3 pm 11/2- 147/80 at 4 pm 11/3-143/72 at 1100 11/4- 148/78 at 0930 11/5- 132/70 at 1:30 pm 11/6- 155/78 at 7:00 pm 11/7- 155/72 at 3:30 pm 11/8- 138/65 at 0900 11/9- 138/74 at 0930  Will forward these readings to Dr. Meda Coffee to review and advise on. Will follow-up with the pt accordingly thereafter.

## 2019-04-03 MED ORDER — LOSARTAN POTASSIUM 25 MG PO TABS: 25.0000 mg | ORAL_TABLET | Freq: Every day | ORAL | 2 refills | Status: DC

## 2019-04-03 NOTE — Telephone Encounter (Signed)
Spoke with the pt and informed her that per Dr. Meda Coffee, her BP remains elevated, and she recommends that we add losartan 25 mg po daily to her regimen.  Advised the pt to continue monitoring her BP/HR, and report any concerning values.  Confirmed the pharmacy of choice with the pt.  Pt verbalized understanding and agrees with this plan.

## 2019-04-03 NOTE — Addendum Note (Signed)
Addended by: Nuala Alpha on: 04/03/2019 08:42 AM   Modules accepted: Orders

## 2019-04-19 ENCOUNTER — Encounter: Payer: Self-pay | Admitting: Internal Medicine

## 2019-05-28 ENCOUNTER — Telehealth: Payer: Self-pay | Admitting: Cardiology

## 2019-05-28 NOTE — Telephone Encounter (Signed)
Patient wants to know if she can wear compression socks?    Pt c/o medication issue:  1. Name of Medication: losartan (COZAAR) 25 MG tablet  2. How are you currently taking this medication (dosage and times per day)? Take 1 tablet (25 mg total) by mouth daily.  3. Are you having a reaction (difficulty breathing--STAT)? --  4. What is your medication issue? States she is having leg pain. She is not sure if this medication is what is causing the pain.

## 2019-05-28 NOTE — Telephone Encounter (Signed)
Pt is calling in with complaints of mild swelling to bilateral ankles, mostly around the ankle areas, and she has bilateral pain from both hips to both lower extremities, especially at night.  Pt states the pains in her legs are mostly at night, and she has been experiencing the bilateral ankle swelling for quite sometime, especially since COVID, since she is more immobile.  Pt states she has no chest pain, SOB, DOE, or any other cardiac complaints.  Pt states she is hydrating well. Pt thinks this could be medicinal related and causing her issues. Pt states her weight has remained the same, and no acute trend noted at all.  Pt is wondering if her meds losartan or atorvastatin are causing these issues.  Advised the pt to limit her sodium intake, hydrate well, ambulate and don't sit for long periods of time, and prop her feet up while at rest.  Informed the pt that I will route this message to Dr. Meda Coffee to review and advise on, and follow-up with the pt thereafter with further recommendations. Advised the pt that I will also route this communication to our Pharmacist to see if they have any further recommendations on complaints. Pt verbalized understanding and agrees with this plan.

## 2019-05-28 NOTE — Telephone Encounter (Signed)
Spoke with the pt and informed her that per [facility] [place] PharmD, she states her losartan should not be causing her issues with bilateral leg pain or even mild swelling to her ankles.  Informed the pt that per [facility], her atorvastatin could be contributing to pain in both of her legs.  Informed the pt that firstly, Jinny Blossom recommends that being she has been very immobile with the pandemic, she should restrict her sodium intake to <2,000 mg a day, and she should elevate her legs while at rest.  Informed the pt that I will write her a compression stocking prescription for Dr. Meda Coffee to sign, upon return to the office in a week.  Informed the pt that once Dr. Meda Coffee signs the compression stocking prescription, I will mail this to her confirmed address on file. Informed the pt that these recommendations above should help with her mild swelling.  Informed the pt that per [facility] PharmD, as far as her bilateral hip/leg pains, we can try switching her to rosuvastatin 10 mg po daily, for this is typically better tolerated.  Informed the pt that she should keep an eye on the leg pains to see if this improves.  Also informed the pt that Jinny Blossom suggest if she doesn't want to switch the statin immediately, she could hold her current statin, atorvastatin, and call me back on this Thursday to report if her symptoms improve or not.  Informed the pt that if her symptoms do improve, then we can switch her to the statin mentioned above, rosuvastatin 10 mg po daily instead. Informed the pt that this will help [place] ensure her pains are related to her statin, and improve when she stops the atorvastatin.  Pt states she would like to HOLD her atorvastatin for a few days, and call this Thursday 1/14, to report if her symptoms improve or not, and if so, to call in suggested rosuvastatin thereafter. Pt states she will await the compression stockings in the mail, for she states there is no rush on this.  Pt states she will comply with  elevating her lower extremities, and reducing her sodium intake to <2,000 mg daily.   Pt verbalized understanding and agrees with this plan.  Pt was more than gracious for all the assistance provided.

## 2019-05-28 NOTE — Telephone Encounter (Signed)
Losartan should not be causing leg pain or mild swelling. Her atorvastatin could be contributing to her leg pain though. Swelling may be more related to recent immobility after COVID infection. Ok to try compression stockings, keep legs elevated, and limit sodium intake to < 2,000mg  daily to help with mild swelling. Would see if she is willing to try rosuvastatin 10mg  daily instead which is typically better tolerated and have her keep an eye on leg pain to see if it improves. Could have her take a few days off of statin therapy before changing statin to ensure symptoms are related to her statin and improve when she stops the atorvastatin.

## 2019-05-31 MED ORDER — ROSUVASTATIN CALCIUM 10 MG PO TABS: 10.0000 mg | ORAL_TABLET | Freq: Every day | ORAL | 2 refills | Status: DC

## 2019-05-31 NOTE — Telephone Encounter (Signed)
Patient is returning call stating she is feeling a lot better after stopping meds she request  Pt c/o medication issue:  1. Name of Medication:   atorvastatin (LIPITOR) 20 MG tablet   2. How are you currently taking this medication (dosage and times per day)? Is not currently taking medication   3. Are you having a reaction (difficulty breathing--STAT)? No  4. What is your medication issue? Patient is returning call stating she is feeling and sleeping a lot better since stopping atorvastatin. She request to continue holding atorvastatin and have a prescription for rosuvastatin sent over to her pharmacy instead.

## 2019-05-31 NOTE — Telephone Encounter (Signed)
Pt called back to the operator and stated she feels much better and bilateral leg pains have subsided since holding her atorvastatin.  Pt would like to proceed with recommended rosuvastatin 10 mg po daily, as our Rand advised.  Sent over rosuvastatin 10 mg po daily to her confirmed pharmacy of choice.  Provided her a month supply to make sure she tolerates this appropriately. Updated this in pts allergies as an intolerance.  Left the pt a detailed message that I sent her new script for rosuvastatin into her pharmacy.  Advised her to stop atorvastatin.  Left her a message to call back with any further questions or concerns regarding message left.

## 2019-06-05 ENCOUNTER — Other Ambulatory Visit: Payer: Self-pay | Admitting: Internal Medicine

## 2019-06-06 NOTE — Telephone Encounter (Signed)
Pts compression stocking prescription was signed by Dr. Meda Coffee, and have mailed it to the pts confirmed mailing address on file.

## 2019-06-14 ENCOUNTER — Encounter: Payer: Medicare Other | Admitting: Internal Medicine

## 2019-06-15 ENCOUNTER — Other Ambulatory Visit: Payer: Self-pay | Admitting: Cardiology

## 2019-06-15 ENCOUNTER — Telehealth: Payer: Self-pay | Admitting: Cardiology

## 2019-06-15 MED ORDER — LOSARTAN POTASSIUM 50 MG PO TABS: 50.0000 mg | ORAL_TABLET | Freq: Every day | ORAL | 1 refills | Status: DC

## 2019-06-15 NOTE — Telephone Encounter (Signed)
Spoke with the pt and informed her that per our PharmD Marcelle Overlie, we will increase her losartan to 50 mg po daily, and have her come back to BP clinic for further follow-up in 1-2 weeks.  Confirmed the pharmacy of choice with the pt. Informed the pt that I will only send in a months supply of this med, to make sure she tolerates this appropriately, and incase further changes need to be made at her BP clinic appt.  Scheduled the pt to come in and see Hypertension clinic in 2 weeks on 06/29/19 at 0930.  Pt is aware to arrive 15 mins prior to that appt. Pt verbalized understanding and agrees with this plan.

## 2019-06-15 NOTE — Telephone Encounter (Signed)
Pt c/o BP issue: STAT if pt c/o blurred vision, one-sided weakness or slurred speech  1. What are your last 5 BP readings?  06-11-19- 139/71 06-12-19- 153/77, 06-13-19- 152/71 06-14-19- 156/79, 174/78 today,  2. Are you having any other symptoms (ex. Dizziness, headache, blurred vision, passed out)?  Headaches this week  3. What is your BP issue? High blood pressure

## 2019-06-15 NOTE — Telephone Encounter (Signed)
Spoke with the pt and she is calling to ask Dr. Meda Coffee if she should increase her BP medication, for her BP has been running high this week, as well as she has developed a headache today. BP readings are as mentioned in this message.  Pt states her HR is running consistently in the 70s.  Pt states she has not changed her diet around and is being very mindful with her sodium intake, as well as staying very hydrated with water.  Pt has no other complaints at this time.  She has no chest pain, sob, doe, weight gain, dizziness, blurred vision, weakness, pre-syncopal or syncopal episodes. Pt states all her readings provided in this message were taken in the morning time, one hour after administration of her breakfast and BP meds.   Informed the pt that I will route this message to Dr. Meda Coffee and our Pharmacist in BP clinic to obtain further recommendations.  Informed the pt that I will follow-up with her shortly thereafter.  Advised her to continue limiting her sodium intake.  Pt verbalized understanding and agrees with this plan.

## 2019-06-15 NOTE — Telephone Encounter (Signed)
Please have patient increase her losartan to 50mg  daily. We can see her in the HTN clinic in 1-2 weeks if Dr. Meda Coffee would like.

## 2019-06-17 NOTE — Patient Instructions (Addendum)
Blood work was ordered.    All other Health Maintenance issues reviewed.   All recommended immunizations and age-appropriate screenings are up-to-date or discussed.  No immunization administered today.   Medications reviewed and updated.  Changes include :   none   A bone density was ordered   Please followup in 1 year   COVID-19 Vaccine Information can be found at: [email or web address] For questions related to vaccine distribution or appointments, please email [email or web address] or call [phone].    Or you can call the department of public health      Health Maintenance, Female Adopting a healthy lifestyle and getting preventive care are important in promoting health and wellness. Ask your health care provider about:  The right schedule for you to have regular tests and exams.  Things you can do on your own to prevent diseases and keep yourself healthy. What should I know about diet, weight, and exercise? Eat a healthy diet   Eat a diet that includes plenty of vegetables, fruits, low-fat dairy products, and lean protein.  Do not eat a lot of foods that are high in solid fats, added sugars, or sodium. Maintain a healthy weight Body mass index (BMI) is used to identify weight problems. It estimates body fat based on height and weight. Your health care provider can help determine your BMI and help you achieve or maintain a healthy weight. Get regular exercise Get regular exercise. This is one of the most important things you can do for your health. Most adults should:  Exercise for at least 150 minutes each week. The exercise should increase your heart rate and make you sweat (moderate-intensity exercise).  Do strengthening exercises at least twice a week. This is in addition to the moderate-intensity exercise.  Spend less time sitting. Even light physical activity can be beneficial. Watch cholesterol and  blood lipids Have your blood tested for lipids and cholesterol at 73 years of age, then have this test every 5 years. Have your cholesterol levels checked more often if:  Your lipid or cholesterol levels are high.  You are older than 73 years of age.  You are at high risk for heart disease. What should I know about cancer screening? Depending on your health history and family history, you may need to have cancer screening at various ages. This may include screening for:  Breast cancer.  Cervical cancer.  Colorectal cancer.  Skin cancer.  Lung cancer. What should I know about heart disease, diabetes, and high blood pressure? Blood pressure and heart disease  High blood pressure causes heart disease and increases the risk of stroke. This is more likely to develop in people who have high blood pressure readings, are of African descent, or are overweight.  Have your blood pressure checked: ? Every 3-5 years if you are 67-75 years of age. ? Every year if you are 28 years old or older. Diabetes Have regular diabetes screenings. This checks your fasting blood sugar level. Have the screening done:  Once every three years after age 67 if you are at a normal weight and have a low risk for diabetes.  More often and at a younger age if you are overweight or have a high risk for diabetes. What should I know about preventing infection? Hepatitis B If you have a higher risk for hepatitis B, you should be screened for this virus. Talk with your health care provider to find out if you are at risk for hepatitis B infection. Hepatitis C Testing is  recommended for:  Everyone born from [birth year] through 1964.  Anyone with known risk factors for hepatitis C. Sexually transmitted infections (STIs)  Get screened for STIs, including gonorrhea and chlamydia, if: ? You are sexually active and are younger than 73 years of age. ? You are older than 73 years of age and your health care provider tells  you that you are at risk for this type of infection. ? Your sexual activity has changed since you were last screened, and you are at increased risk for chlamydia or gonorrhea. Ask your health care provider if you are at risk.  Ask your health care provider about whether you are at high risk for HIV. Your health care provider may recommend a prescription medicine to help prevent HIV infection. If you choose to take medicine to prevent HIV, you should first get tested for HIV. You should then be tested every 3 months for as long as you are taking the medicine. Pregnancy  If you are about to stop having your period (premenopausal) and you may become pregnant, seek counseling before you get pregnant.  Take 400 to 800 micrograms (mcg) of folic acid every day if you become pregnant.  Ask for birth control (contraception) if you want to prevent pregnancy. Osteoporosis and menopause Osteoporosis is a disease in which the bones lose minerals and strength with aging. This can result in bone fractures. If you are 41 years old or older, or if you are at risk for osteoporosis and fractures, ask your health care provider if you should:  Be screened for bone loss.  Take a calcium or vitamin D supplement to lower your risk of fractures.  Be given hormone replacement therapy (HRT) to treat symptoms of menopause. Follow these instructions at home: Lifestyle  Do not use any products that contain nicotine or tobacco, such as cigarettes, e-cigarettes, and chewing tobacco. If you need help quitting, ask your health care provider.  Do not use street drugs.  Do not share needles.  Ask your health care provider for help if you need support or information about quitting drugs. Alcohol use  Do not drink alcohol if: ? Your health care provider tells you not to drink. ? You are pregnant, may be pregnant, or are planning to become pregnant.  If you drink alcohol: ? Limit how much you use to 0-1 drink a day. ?  Limit intake if you are breastfeeding.  Be aware of how much alcohol is in your drink. In the [place] one drink equals one 12 oz bottle of beer (355 mL), one 5 oz glass of wine (148 mL), or one 1 oz glass of hard liquor (44 mL). General instructions  Schedule regular health, dental, and eye exams.  Stay current with your vaccines.  Tell your health care provider if: ? You often feel depressed. ? You have ever been abused or do not feel safe at home. Summary  Adopting a healthy lifestyle and getting preventive care are important in promoting health and wellness.  Follow your health care provider's instructions about healthy diet, exercising, and getting tested or screened for diseases.  Follow your health care provider's instructions on monitoring your cholesterol and blood pressure. This information is not intended to replace advice given to you by your health care provider. Make sure you discuss any questions you have with your health care provider. Document Revised: 04/26/2018 Document Reviewed: 04/26/2018 Elsevier Patient Education  2020 [employer].

## 2019-06-17 NOTE — Progress Notes (Signed)
Subjective:    Patient ID: Sarah Hendricks, female    DOB: Jun 05, 1945, 74 y.o.   MRN: [id number]  HPI She is here for a physical exam.   BP was elevated at home Friday - 174/?  So she called cardiology and her losartan was increased to 50 mg two days ago.  She is compliant with a low sodium diet.  She has follow up with the hypertension clinic in 2 weeks.  She denies any headaches, chest pain or palpitations.    Medications and allergies reviewed with patient and updated if appropriate.  Patient Active Problem List   Diagnosis Date Noted  . Prediabetes 05/26/2017  . Heel pain 10/20/2015  . Osteopenia 05/30/2014  . CAD (coronary artery disease)   . Hypertension   . Hyperlipidemia   . History of breast cancer   . Vitamin D deficiency     Current Outpatient Medications on File Prior to Visit  Medication Sig Dispense Refill  . aspirin 81 MG tablet Take 81 mg by mouth daily.    . calcium carbonate (OS-CAL) 600 MG TABS tablet Take 600 mg by mouth 2 (two) times daily with a meal.     . carvedilol (COREG) 12.5 MG tablet Take 1 tablet (12.5 mg total) by mouth 2 (two) times daily with a meal. 180 tablet 3  . Cholecalciferol (EQL VITAMIN D3) 1000 units tablet Take 1 tablet (1,000 Units total) by mouth daily.    Marland Kitchen losartan (COZAAR) 50 MG tablet TAKE 1 TABLET(50 MG) BY MOUTH DAILY 90 tablet 2  . Omega-3 Fatty Acids (FISH OIL) 1200 MG CAPS Take by mouth daily.    . rosuvastatin (CRESTOR) 10 MG tablet Take 1 tablet (10 mg total) by mouth daily. 30 tablet 2   No current facility-administered medications on file prior to visit.    Past Medical History:  Diagnosis Date  . Breast cancer ([place]) 1994   lumpectomy and XRT, no chemo  . CAD (coronary artery disease) 2008   PTCA, in [place]  . GERD (gastroesophageal reflux disease)   . History of chicken pox   . Hyperlipidemia   . Hypertension   . Vitamin D deficiency     Past Surgical History:  Procedure Laterality Date  . BREAST BIOPSY  Left 1994  . BREAST LUMPECTOMY Left 1994  . CRANIECTOMY FOR DEPRESSED SKULL FRACTURE  1986  . HEART ATTACK  2008   STENT PLACED  . TONSILLECTOMY  1966    Social History   Socioeconomic History  . Marital status: Married    Spouse name: Not on file  . Number of children: Not on file  . Years of education: Not on file  . Highest education level: Not on file  Occupational History  . Not on file  Tobacco Use  . Smoking status: Never Smoker  . Smokeless tobacco: Never Used  Substance and Sexual Activity  . Alcohol use: No  . Drug use: No  . Sexual activity: Not on file  Other Topics Concern  . Not on file  Social History Narrative   Married, lives with spouse   Retired from [employer] irregularly, rides an exercise bike   Social Determinants of Health   Financial Resource Strain:   . Difficulty of Paying Living Expenses: Not on file  Food Insecurity:   . Worried About Charity fundraiser in the Last Year: Not on file  . Ran Out of Food in the Last Year: Not  on file  Transportation Needs:   . Lack of Transportation (Medical): Not on file  . Lack of Transportation (Non-Medical): Not on file  Physical Activity:   . Days of Exercise per Week: Not on file  . Minutes of Exercise per Session: Not on file  Stress:   . Feeling of Stress : Not on file  Social Connections:   . Frequency of Communication with Friends and Family: Not on file  . Frequency of Social Gatherings with Friends and Family: Not on file  . Attends Religious Services: Not on file  . Active Member of Clubs or Organizations: Not on file  . Attends Archivist Meetings: Not on file  . Marital Status: Not on file    Family History  Problem Relation Age of Onset  . Breast cancer Mother   . Hypertension Mother   . Heart disease Mother   . Stroke Mother   . Arthritis Father   . Heart disease Brother   . Stroke Maternal Grandmother   . Heart disease Maternal Grandfather      Review of Systems  Constitutional: Negative for chills and fever.  Eyes: Negative for visual disturbance.  Respiratory: Negative for cough, shortness of breath and wheezing.   Cardiovascular: Positive for leg swelling (mild, anterior aspect on ankles). Negative for chest pain and palpitations.  Gastrointestinal: Negative for abdominal pain, blood in stool, constipation, diarrhea and nausea.       No gerd  Genitourinary: Negative for dysuria and hematuria.  Musculoskeletal: Negative for arthralgias and back pain.  Skin: Negative for color change and rash.  Neurological: Negative for light-headedness and headaches.  Psychiatric/Behavioral: Negative for dysphoric mood. The patient is not nervous/anxious.        Objective:   Vitals:   06/18/19 0900 06/18/19 1036  BP: (!) 182/90 (!) 172/86  Pulse: 65   Resp: 16   Temp: 97.9 F (36.6 C)   SpO2: 98%    Filed Weights   06/18/19 0900  Weight: 157 lb (71.2 kg)   Body mass index is 27.81 kg/m.  BP Readings from Last 3 Encounters:  06/18/19 (!) 172/86  03/13/19 (!) 146/92  06/12/18 140/88    Wt Readings from Last 3 Encounters:  06/18/19 157 lb (71.2 kg)  03/13/19 159 lb 12.8 oz (72.5 kg)  06/12/18 153 lb 12.8 oz (69.8 kg)     Physical Exam Constitutional: She appears well-developed and well-nourished. No distress.  HENT:  Head: Normocephalic and atraumatic.  Right Ear: External ear normal. Normal ear canal and TM Left Ear: External ear normal.  Normal ear canal and TM Mouth/Throat: Oropharynx is clear and moist.  Eyes: Conjunctivae and EOM are normal.  Neck: Neck supple. No tracheal deviation present. No thyromegaly present.  No carotid bruit  Cardiovascular: Normal rate, regular rhythm and normal heart sounds.   No murmur heard.  No edema. Pulmonary/Chest: Effort normal and breath sounds normal. No respiratory distress. She has no wheezes. She has no rales.  Breast: deferred   Abdominal: Soft. She exhibits no  distension. There is no tenderness.  Lymphadenopathy: She has no cervical adenopathy.  Skin: Skin is warm and dry. She is not diaphoretic.  Psychiatric: She has a normal mood and affect. Her behavior is normal.        Assessment & Plan:   Physical exam: Screening blood work    ordered Immunizations  Discussed shingrix, covid Colonoscopy    Due -  Discussed colonoscopy vs cologuard Mammogram  Up to  date  Gyn  N/a  - no longer seeing Dexa   Due - ordered Eye exams   Up to date  Exercise   none Weight    Weight ok for age Substance abuse  none  See Problem List for Assessment and Plan of chronic medical problems.     This visit occurred during the SARS-CoV-2 public health emergency.  Safety protocols were in place, including screening questions prior to the visit, additional usage of staff PPE, and extensive cleaning of exam room while observing appropriate contact time as indicated for disinfecting solutions.

## 2019-06-18 ENCOUNTER — Other Ambulatory Visit: Payer: Self-pay

## 2019-06-18 ENCOUNTER — Ambulatory Visit (INDEPENDENT_AMBULATORY_CARE_PROVIDER_SITE_OTHER): Payer: Medicare Other | Admitting: Internal Medicine

## 2019-06-18 ENCOUNTER — Encounter: Payer: Self-pay | Admitting: Internal Medicine

## 2019-06-18 VITALS — BP 172/86 | HR 65 | Temp 97.9°F | Resp 16 | Ht 63.0 in | Wt 157.0 lb

## 2019-06-18 DIAGNOSIS — I10 Essential (primary) hypertension: Secondary | ICD-10-CM | POA: Diagnosis not present

## 2019-06-18 DIAGNOSIS — E559 Vitamin D deficiency, unspecified: Secondary | ICD-10-CM | POA: Diagnosis not present

## 2019-06-18 DIAGNOSIS — M85861 Other specified disorders of bone density and structure, right lower leg: Secondary | ICD-10-CM

## 2019-06-18 DIAGNOSIS — R7303 Prediabetes: Secondary | ICD-10-CM | POA: Diagnosis not present

## 2019-06-18 DIAGNOSIS — I2510 Atherosclerotic heart disease of native coronary artery without angina pectoris: Secondary | ICD-10-CM

## 2019-06-18 DIAGNOSIS — E7849 Other hyperlipidemia: Secondary | ICD-10-CM

## 2019-06-18 DIAGNOSIS — M85862 Other specified disorders of bone density and structure, left lower leg: Secondary | ICD-10-CM

## 2019-06-18 DIAGNOSIS — Z0000 Encounter for general adult medical examination without abnormal findings: Secondary | ICD-10-CM | POA: Diagnosis not present

## 2019-06-18 LAB — LIPID PANEL
Cholesterol: 135 mg/dL (ref 0–200)
HDL: 45.1 mg/dL (ref >39.00)
LDL Cholesterol: 55 mg/dL (ref 0–99)
NonHDL: 89.85
Total CHOL/HDL Ratio: 3
Triglycerides: 174 mg/dL — ABNORMAL HIGH (ref 0.0–149.0)
VLDL: 34.8 mg/dL (ref 0.0–40.0)

## 2019-06-18 LAB — CBC WITH DIFFERENTIAL/PLATELET
Basophils Absolute: 0.1 10*3/uL (ref 0.0–0.1)
Basophils Relative: 1.2 % (ref 0.0–3.0)
Eosinophils Absolute: 0.2 10*3/uL (ref 0.0–0.7)
Eosinophils Relative: 3.4 % (ref 0.0–5.0)
HCT: 41 % (ref 36.0–46.0)
Hemoglobin: 14 g/dL (ref 12.0–15.0)
Lymphocytes Relative: 25.9 % (ref 12.0–46.0)
Lymphs Abs: 1.9 10*3/uL (ref 0.7–4.0)
MCHC: 34.2 g/dL (ref 30.0–36.0)
MCV: 92.4 fl (ref 78.0–100.0)
Monocytes Absolute: 0.7 10*3/uL (ref 0.1–1.0)
Monocytes Relative: 9.2 % (ref 3.0–12.0)
Neutro Abs: 4.4 10*3/uL (ref 1.4–7.7)
Neutrophils Relative %: 60.3 % (ref 43.0–77.0)
Platelets: 259 10*3/uL (ref 150.0–400.0)
RBC: 4.43 Mil/uL (ref 3.87–5.11)
RDW: 12.9 % (ref 11.5–15.5)
WBC: 7.2 10*3/uL (ref 4.0–10.5)

## 2019-06-18 LAB — VITAMIN D 25 HYDROXY (VIT D DEFICIENCY, FRACTURES): VITD: 60.11 ng/mL (ref 30.00–100.00)

## 2019-06-18 LAB — COMPREHENSIVE METABOLIC PANEL
ALT: 16 U/L (ref 0–35)
AST: 18 U/L (ref 0–37)
Albumin: 4.2 g/dL (ref 3.5–5.2)
Alkaline Phosphatase: 73 U/L (ref 39–117)
BUN: 14 mg/dL (ref 6–23)
CO2: 24 mEq/L (ref 19–32)
Calcium: 9.2 mg/dL (ref 8.4–10.5)
Chloride: 97 mEq/L (ref 96–112)
Creatinine, Ser: 0.76 mg/dL (ref 0.40–1.20)
GFR: 74.74 mL/min (ref >60.00)
Glucose, Bld: 97 mg/dL (ref 70–99)
Potassium: 4 mEq/L (ref 3.5–5.1)
Sodium: 130 mEq/L — ABNORMAL LOW (ref 135–145)
Total Bilirubin: 0.6 mg/dL (ref 0.2–1.2)
Total Protein: 7.1 g/dL (ref 6.0–8.3)

## 2019-06-18 LAB — TSH: TSH: 1.52 u[IU]/mL (ref 0.35–4.50)

## 2019-06-18 LAB — HEMOGLOBIN A1C: Hgb A1c MFr Bld: 5.8 % (ref 4.6–6.5)

## 2019-06-18 NOTE — Assessment & Plan Note (Signed)
Chronic Blood pressure recently very high at home and she called cardiology and her losartan dose was increased-this was just couple of days ago so the increased dose has likely not had time to take and affect Blood pressure elevated here today but only slightly decreased on recheck No changes since her medication was just adjusted She will continue to monitor at home She has a follow-up with the hypertension clinic in 2 weeks CBC, CMP, TSH

## 2019-06-18 NOTE — Assessment & Plan Note (Signed)
Chronic Due for a bone density-ordered Stressed the importance of regular exercise Taking calcium and vitamin D We will check vitamin D level

## 2019-06-18 NOTE — Assessment & Plan Note (Signed)
Taking vitamin D daily We will check level

## 2019-06-18 NOTE — Assessment & Plan Note (Signed)
Chronic Check lipid panel  Continue daily statin Regular exercise and healthy diet encouraged  

## 2019-06-18 NOTE — Assessment & Plan Note (Signed)
Chronic Check a1c Low sugar / carb diet Stressed regular exercise  

## 2019-06-18 NOTE — Assessment & Plan Note (Signed)
Chronic Asymptomatic Taking aspirin 81 mg, carvedilol and Crestor We will check TSH, CBC, CMP, lipids

## 2019-06-19 ENCOUNTER — Encounter: Payer: Self-pay | Admitting: Internal Medicine

## 2019-06-27 ENCOUNTER — Other Ambulatory Visit: Payer: Self-pay

## 2019-06-27 ENCOUNTER — Ambulatory Visit (INDEPENDENT_AMBULATORY_CARE_PROVIDER_SITE_OTHER)
Admission: RE | Admit: 2019-06-27 | Discharge: 2019-06-27 | Disposition: A | Discharge status: Home / Self Care | Payer: Medicare Other | Source: Ambulatory Visit | Attending: Internal Medicine | Admitting: Internal Medicine

## 2019-06-27 DIAGNOSIS — M85861 Other specified disorders of bone density and structure, right lower leg: Secondary | ICD-10-CM | POA: Diagnosis not present

## 2019-06-27 DIAGNOSIS — M85862 Other specified disorders of bone density and structure, left lower leg: Secondary | ICD-10-CM | POA: Diagnosis not present

## 2019-06-28 ENCOUNTER — Encounter: Payer: Self-pay | Admitting: Internal Medicine

## 2019-06-29 ENCOUNTER — Other Ambulatory Visit: Payer: Self-pay

## 2019-06-29 ENCOUNTER — Ambulatory Visit (INDEPENDENT_AMBULATORY_CARE_PROVIDER_SITE_OTHER): Payer: Medicare Other | Admitting: Pharmacist

## 2019-06-29 VITALS — BP 158/82 | HR 68

## 2019-06-29 DIAGNOSIS — I10 Essential (primary) hypertension: Secondary | ICD-10-CM | POA: Diagnosis not present

## 2019-06-29 LAB — BASIC METABOLIC PANEL
BUN/Creatinine Ratio: 14 (ref 12–28)
BUN: 11 mg/dL (ref 8–27)
CO2: 26 mmol/L (ref 20–29)
Calcium: 10 mg/dL (ref 8.7–10.3)
Chloride: 96 mmol/L (ref 96–106)
Creatinine, Ser: 0.81 mg/dL (ref 0.57–1.00)
GFR calc Af Amer: 84 mL/min/{1.73_m2} (ref >59)
GFR calc non Af Amer: 73 mL/min/{1.73_m2} (ref >59)
Glucose: 99 mg/dL (ref 65–99)
Potassium: 4.4 mmol/L (ref 3.5–5.2)
Sodium: 133 mmol/L — ABNORMAL LOW (ref 134–144)

## 2019-06-29 MED ORDER — LOSARTAN POTASSIUM 50 MG PO TABS: 75.0000 mg | ORAL_TABLET | Freq: Every day | ORAL | 11 refills | Status: DC

## 2019-06-29 NOTE — Progress Notes (Signed)
Patient ID: Sarah Hendricks                 DOB: 1946/02/10                      MRN: [id number]     HPI: Sarah Hendricks is a 74 y.o. female referred by Dr. Meda Coffee to HTN clinic. PMH is significant for HTN, HLD, CAD s/p stent placement in 2008, and breast cancer s/p lumpectomy and XRT but no chemotherapy. Has previously reported elevated BP readings only at the doctor's office. She called clinic in November 2020 with home BP readings 140-150s/70s a few weeks after carvedilol dose was increased to 12.5mg  BID. Her atorvastatin was stopped in January secondary to bilateral leg/hip pain - she reported feeling better and improved sleep after stopping atorvastatin, and was then changed to rosuvastatin. On January 29, pt called clinic with elevated BP 140-170s/70s accompanied by headaches. Her losartan was increased to 50mg  daily and she presents today for follow up.  Pt presents today in good spirits. She saw her PCP Dr Quay Burow on 2/1 shortly after increasing her losartan dose - BP was elevated at 172/86. Pt reports BP readings run higher in the office than at home. She has been checking her BP daily at home 1 hour after taking her meds in the AM. 3 readings at goal, most readings 130-140s/60-70s, HR 60-70. She uses a bicep cuff she has had for 3-4 years. She is tolerating her medications well. She was dizzy and felt like she was going to pass out the first day she increased her losartan dose, but has felt back to normal since then - no further episodes of dizziness, denies blurred vision and headaches. Uses Tylenol as needed for headaches (~1x/week), denies NSAID use.  Current HTN meds: carvedilol 12.5mg  BID, losartan 50mg  daily  BP goal: <130/38mmHg  Family History: The patient's family history includes Arthritis in her father; Breast cancer in her mother; Heart disease in her brother, maternal grandfather, and mother; Hypertension in her mother; Stroke in her maternal grandmother and mother.   Social  History:  The patient  reports that she has never smoked. She has never used smokeless tobacco. She reports that she does not drink alcohol or use drugs.   Diet: Mindful of sodium intake, stays hydrated with water. Cooking more at home - veggies, doesn't eat much meat, avoids bread, eats sandwiches sometimes. Drinks 2 cups of decaf coffee each day.  Exercise: Walks, has a stationary bike (has not been using as much lately)  Home BP readings: 3 readings at goal, most readings 130-140s/60-70s, HR 60-70. Uses bicep cuff she has had for 3-4 years.  Wt Readings from Last 3 Encounters:  06/18/19 157 lb (71.2 kg)  03/13/19 159 lb 12.8 oz (72.5 kg)  06/12/18 153 lb 12.8 oz (69.8 kg)   BP Readings from Last 3 Encounters:  06/18/19 (!) 172/86  03/13/19 (!) 146/92  06/12/18 140/88   Pulse Readings from Last 3 Encounters:  06/18/19 65  03/13/19 71  06/12/18 61    Renal function: Estimated Creatinine Clearance: 60.1 mL/min (by C-G formula based on SCr of 0.76 mg/dL).  Past Medical History:  Diagnosis Date  . Breast cancer ([place]) 1994   lumpectomy and XRT, no chemo  . CAD (coronary artery disease) 2008   PTCA, in [place]  . GERD (gastroesophageal reflux disease)   . History of chicken pox   . Hyperlipidemia   . Hypertension   . Vitamin  D deficiency     Current Outpatient Medications on File Prior to Visit  Medication Sig Dispense Refill  . aspirin 81 MG tablet Take 81 mg by mouth daily.    . calcium carbonate (OS-CAL) 600 MG TABS tablet Take 600 mg by mouth 2 (two) times daily with a meal.     . carvedilol (COREG) 12.5 MG tablet Take 1 tablet (12.5 mg total) by mouth 2 (two) times daily with a meal. 180 tablet 3  . Cholecalciferol (EQL VITAMIN D3) 1000 units tablet Take 1 tablet (1,000 Units total) by mouth daily.    Marland Kitchen losartan (COZAAR) 50 MG tablet TAKE 1 TABLET(50 MG) BY MOUTH DAILY 90 tablet 2  . Omega-3 Fatty Acids (FISH OIL) 1200 MG CAPS Take by mouth daily.    . rosuvastatin  (CRESTOR) 10 MG tablet Take 1 tablet (10 mg total) by mouth daily. 30 tablet 2   No current facility-administered medications on file prior to visit.    Allergies  Allergen Reactions  . Acetaminophen-Codeine Other (See Comments)    Nightmares  . Tigan [Trimethobenzamide] Other (See Comments)    Nightmares  . Atorvastatin Other (See Comments)    Pt reports "causes bilateral leg pains."  . Codeine     Nightmares     Assessment/Plan:  1. Hypertension - BP above goal <130/8mmHg in clinic and with most home readings, although pt does report white coat HTN and clinic reading is higher than home readings. Will check BMET today and increase losartan to 75mg  daily. Continue carvedilol 12.5mg  BID. Encouraged pt to start using her stationary bicycle and continue with 000mg  daily sodium intake. She will monitor her BP at h ome and bring log with readings and home cuff to follow up visit in 4 weeks. If home cuff measures accurately, will adjust BP meds based on home readings rather than clinic readings due to white coat HTN.  Copper Basnett E. Dalylah Ramey, PharmD, BCACP, [place] [id number] [place], [place] [address] Phone: [phone]; Fax: [phone] 06/29/2019 10:02 AM

## 2019-06-29 NOTE — Patient Instructions (Addendum)
It was nice to meet you today!  Increase your losartan to 75mg  (1.5 tablets of the 50mg ) each day  Continue taking your other medications  Monitor your blood pressure at home - your goal is less than 130/45mmHg  Start using your stationary bicycle at home  Continue limiting your sodium to < 2,000mg  daily   Monitor and record your blood pressure readings. Please bring these and your home cuff to your next follow up visit in 3 weeks.

## 2019-07-24 NOTE — Progress Notes (Signed)
Patient ID: Sarah Hendricks                 DOB: 11-16-1945                      MRN: [id number]     HPI: Sarah Hendricks is a 74 y.o. female referred by Dr. Meda Coffee to HTN clinic. PMH is significant for HTN, HLD, CAD s/p stent placement in 2008, and breast cancer s/p lumpectomy and XRT but no chemotherapy. Has previously reported elevated BP readings only at the doctor's office. At last visit, BP in clinic was 158/82 while home BP readings were 130-140s/60-70s, HR 60-70). Losartan was increased to 75mg  daily and carvedilol 12.5mg  BID was continued. BMET was normal. Patient was encouraged to bring in home BP cuff and readings to confirm home BP readings and adjust medications accordingly.   Patient presents to clinic today for follow up of hypertension. Patient brought in her blood pressure log with readings consistently 130-150/60-70's, HR 60's. Patient endorses checking her blood pressure 1-2 hours after morning medications. Patient brought in her home BP cuff of 3 years. Her blood pressure in clinic was as follows - Home BP cuff: 198/102 (left arm), 193/98 (right arm). Manual cuff: 190/95 (left arm), 192/100 (left arm). Patient expressed feelings of anxiety waiting for her appointment due to prolonged check in time in the lobby as well as soreness from working in her garden, not sleeping well the night before, and eating saltier foods in the past week (hot dog, potato chips). [name] endorsed tolerating her medications well and denied symptoms of dizziness, lightheadedness, or falls. She is also exercising more by doing yard work.   Current HTN meds: carvedilol 12.5mg  BID, losartan 75mg  daily  BP goal: <130/37mmHg  Family History: The patient's family history includes Arthritis in her father; Breast cancer in her mother; Heart disease in her brother, maternal grandfather, and mother; Hypertension in her mother; Stroke in her maternal grandmother and mother.   Social History:  The patient   reports that she has never smoked. She has never used smokeless tobacco. She reports that she does not drink alcohol or use drugs.   Diet: Mindful of sodium intake, stays hydrated with water. Cooking more at home - veggies, doesn't eat much meat, avoids bread, eats sandwiches sometimes. Drinks 2 cups of decaf coffee each day.  Exercise: Walks, doing yard work (gardening)  Home BP readings: 130-150/60-70's, taking 1-2 hours after morning medications, HR 60's  Wt Readings from Last 3 Encounters:  06/18/19 157 lb (71.2 kg)  03/13/19 159 lb 12.8 oz (72.5 kg)  06/12/18 153 lb 12.8 oz (69.8 kg)   BP Readings from Last 3 Encounters:  06/29/19 (!) 158/82  06/18/19 (!) 172/86  03/13/19 (!) 146/92   Pulse Readings from Last 3 Encounters:  06/29/19 68  06/18/19 65  03/13/19 71    Renal function: CrCl cannot be calculated (Patient's most recent lab result is older than the maximum 21 days allowed.).  Past Medical History:  Diagnosis Date  . Breast cancer ([name]) 1994   lumpectomy and XRT, no chemo  . CAD (coronary artery disease) 2008   PTCA, in [place]  . GERD (gastroesophageal reflux disease)   . History of chicken pox   . Hyperlipidemia   . Hypertension   . Vitamin D deficiency     Current Outpatient Medications on File Prior to Visit  Medication Sig Dispense Refill  . aspirin 81 MG tablet Take 81 mg  by mouth daily.    . calcium carbonate (OS-CAL) 600 MG TABS tablet Take 600 mg by mouth 2 (two) times daily with a meal.     . carvedilol (COREG) 12.5 MG tablet Take 1 tablet (12.5 mg total) by mouth 2 (two) times daily with a meal. 180 tablet 3  . Cholecalciferol (EQL VITAMIN D3) 1000 units tablet Take 1 tablet (1,000 Units total) by mouth daily.    Marland Kitchen losartan (COZAAR) 50 MG tablet Take 1.5 tablets (75 mg total) by mouth daily. 45 tablet 11  . Omega-3 Fatty Acids (FISH OIL) 1200 MG CAPS Take by mouth daily.    . rosuvastatin (CRESTOR) 10 MG tablet Take 1 tablet (10 mg total) by mouth  daily. 30 tablet 2   No current facility-administered medications on file prior to visit.    Allergies  Allergen Reactions  . Acetaminophen-Codeine Other (See Comments)    Nightmares  . Tigan [Trimethobenzamide] Other (See Comments)    Nightmares  . Atorvastatin Other (See Comments)    Pt reports "causes bilateral leg pains."  . Codeine     Nightmares     Assessment/Plan:  1. Hypertension - BP remains far above goal in clinic secondary to white coat HTN, although home readings remain above goal <130/38mmHg as well. Since home blood pressure cuff is measuring accurately, we will adjust medications based on her home blood pressure readings due to white coat HTN. [name] will have a BMET drawn today and, if normal, we will increase losartan to 100mg  daily. Patient is to continue carvedilol 12.5mg  twice daily. Patient was encouraged to send [place] a MyChart message of her home blood pressure readings in 2 weeks. If still elevated, we discussed potentially adding amlodipine 5mg  daily.   [name], PharmD Candidate  Megan E. Supple, PharmD, BCACP, [place] [id number], [place], [place] [address] Phone: [phone]; Fax: [phone] 07/26/2019 10:03 AM

## 2019-07-26 ENCOUNTER — Ambulatory Visit (INDEPENDENT_AMBULATORY_CARE_PROVIDER_SITE_OTHER): Payer: Medicare Other | Admitting: Pharmacist

## 2019-07-26 ENCOUNTER — Other Ambulatory Visit: Payer: Self-pay

## 2019-07-26 VITALS — BP 192/100 | HR 74

## 2019-07-26 DIAGNOSIS — I10 Essential (primary) hypertension: Secondary | ICD-10-CM | POA: Diagnosis not present

## 2019-07-26 LAB — BASIC METABOLIC PANEL
BUN/Creatinine Ratio: 12 (ref 12–28)
BUN: 10 mg/dL (ref 8–27)
CO2: 23 mmol/L (ref 20–29)
Calcium: 9.8 mg/dL (ref 8.7–10.3)
Chloride: 95 mmol/L — ABNORMAL LOW (ref 96–106)
Creatinine, Ser: 0.83 mg/dL (ref 0.57–1.00)
GFR calc Af Amer: 81 mL/min/{1.73_m2} (ref >59)
GFR calc non Af Amer: 71 mL/min/{1.73_m2} (ref >59)
Glucose: 98 mg/dL (ref 65–99)
Potassium: 4.3 mmol/L (ref 3.5–5.2)
Sodium: 132 mmol/L — ABNORMAL LOW (ref 134–144)

## 2019-07-26 NOTE — Patient Instructions (Addendum)
It was great seeing you today!  Your home BP readings are still slightly elevated above your goal of <130/80. We will follow up on a BMET today and call you with your results tomorrow. If everything is normal, we will most likely increase your losartan to 100mg  daily. Continue taking your carvedilol 12.5mg  twice daily.   Continue to take your blood pressure at home and record your BP readings. Feel free to upload to [facility] but we will give you a call in 2 weeks to follow up. If your pressure is still above goal, we may start a medication called amlodipine.

## 2019-07-27 ENCOUNTER — Telehealth: Payer: Self-pay

## 2019-07-27 MED ORDER — LOSARTAN POTASSIUM 100 MG PO TABS: 100.0000 mg | ORAL_TABLET | Freq: Every day | ORAL | 3 refills | Status: DC

## 2019-07-27 NOTE — Telephone Encounter (Addendum)
Called patient regarding her BMET from yesterday. Everything looked normal and we are increasing her losartan from 75mg  daily to 100mg  daily. Patient refused follow up lab work in 2 weeks but we will follow up over the phone to see how patient is tolerating the dose increase.

## 2019-08-01 ENCOUNTER — Other Ambulatory Visit: Payer: Self-pay

## 2019-08-01 MED ORDER — ROSUVASTATIN CALCIUM 10 MG PO TABS: 10.0000 mg | ORAL_TABLET | Freq: Every day | ORAL | 6 refills | Status: DC

## 2019-08-09 NOTE — Telephone Encounter (Signed)
Spoke with patient regarding her recent increase from losartan 75mg  daily to 100mg  daily. Patient states she has tolerated this dose well and actually feels better. Her blood pressure readings average 130's/70's (138/69, 130/70, 135/63, 136/72, 136/73, 140/70) with HR 60's (range 59-68) and patient reports she is taking her BP 1-2 hours after her medications and after sitting for a few minutes. Patient to continue losartan 100mg  daily and carvedilol 12.5mg  twice daily.  Unfortunately, Sarah Hendricks's husband had a stroke 2 weeks ago and she is his sole caregiver. Patient is not able to schedule a follow up visit at this time for that reason but she was agreeable to having a phone visit in 2 weeks to check in on her blood pressure and potentially schedule an in person visit for a BP check and BMET at that time.

## 2019-08-23 ENCOUNTER — Telehealth: Payer: Self-pay | Admitting: Pharmacist

## 2019-08-23 DIAGNOSIS — I2510 Atherosclerotic heart disease of native coronary artery without angina pectoris: Secondary | ICD-10-CM

## 2019-08-23 DIAGNOSIS — E782 Mixed hyperlipidemia: Secondary | ICD-10-CM

## 2019-08-23 DIAGNOSIS — I10 Essential (primary) hypertension: Secondary | ICD-10-CM

## 2019-08-23 NOTE — Addendum Note (Signed)
Addended by: Rachael Zapanta E on: 08/23/2019 04:27 PM   Modules accepted: Orders

## 2019-08-23 NOTE — Telephone Encounter (Signed)
Called pt and left message to discuss BP readings on carvedilol 12.5mg  BID and losartan 100mg  daily.   She ideally needs f/u in office scheduled to recheck BMET on higher dose of losartan, but her husband recently had a stroke so pt was unable to schedule f/u when we reached out to her 2 weeks ago.

## 2019-08-23 NOTE — Telephone Encounter (Signed)
Pt returned call to clinic. Reports feeling well overall, still under a great deal of stress with her husband's condition. Lowest BP she has seen at home is 132/75, highest is 146/79. Reports feeling a little shaky/trembling in her hands and head. Advised this should not be related to her medications.  She is willing to increase her carvedilol to 25mg  BID, will continue losartan 100mg  daily. Will call pt 4/19 to follow up with BP and HR. She will double up on her current dose of carvedilol 12.5mg  since she has a lot of tablets left. Will send in new rx for higher strength during follow up phone call if pt is tolerating well.

## 2019-09-03 ENCOUNTER — Telehealth: Payer: Self-pay | Admitting: Pharmacist

## 2019-09-03 DIAGNOSIS — I2510 Atherosclerotic heart disease of native coronary artery without angina pectoris: Secondary | ICD-10-CM

## 2019-09-03 DIAGNOSIS — I10 Essential (primary) hypertension: Secondary | ICD-10-CM

## 2019-09-03 DIAGNOSIS — E782 Mixed hyperlipidemia: Secondary | ICD-10-CM

## 2019-09-03 MED ORDER — CARVEDILOL 25 MG PO TABS: 25.0000 mg | ORAL_TABLET | Freq: Two times a day (BID) | ORAL | 3 refills | Status: DC

## 2019-09-03 NOTE — Telephone Encounter (Signed)
Called pt to follow up with home BP readings after recently increasing carvedilol dose to 25mg  BID. She continues on losartan 100mg  daily. She is feeling well and tolerating medications well. Denies dizziness, falls, or headache.  Home BP readings have ranged 133/71 - 138/79. She prefers to continue on losartan 100mg  daily and carvedilol 25mg  BID. She will continue to monitor BP at home and call clinic if systolic increases to the 140s, could consider adding low dose HCTZ in combo pill with her losartan. In the mean time, she will work to increase physical activity and decrease salt/caffeine intake.

## 2019-09-24 ENCOUNTER — Telehealth: Payer: Self-pay | Admitting: Internal Medicine

## 2019-09-24 NOTE — Telephone Encounter (Signed)
    Patient calling to report she had Moderna vaccine on 2/26 and second dose 3/26 at [facility]

## 2019-10-18 ENCOUNTER — Other Ambulatory Visit: Payer: Self-pay | Admitting: Internal Medicine

## 2019-10-18 DIAGNOSIS — Z1231 Encounter for screening mammogram for malignant neoplasm of breast: Secondary | ICD-10-CM

## 2019-10-26 DIAGNOSIS — H02831 Dermatochalasis of right upper eyelid: Secondary | ICD-10-CM | POA: Diagnosis not present

## 2019-10-26 DIAGNOSIS — H5213 Myopia, bilateral: Secondary | ICD-10-CM | POA: Diagnosis not present

## 2019-10-26 DIAGNOSIS — H2513 Age-related nuclear cataract, bilateral: Secondary | ICD-10-CM | POA: Diagnosis not present

## 2019-11-08 ENCOUNTER — Other Ambulatory Visit: Payer: Self-pay

## 2019-11-08 ENCOUNTER — Telehealth: Payer: Self-pay | Admitting: Cardiology

## 2019-11-08 ENCOUNTER — Ambulatory Visit
Admission: RE | Admit: 2019-11-08 | Discharge: 2019-11-08 | Disposition: A | Discharge status: Home / Self Care | Payer: Medicare Other | Source: Ambulatory Visit | Attending: Internal Medicine | Admitting: Internal Medicine

## 2019-11-08 DIAGNOSIS — I2510 Atherosclerotic heart disease of native coronary artery without angina pectoris: Secondary | ICD-10-CM

## 2019-11-08 DIAGNOSIS — Z1231 Encounter for screening mammogram for malignant neoplasm of breast: Secondary | ICD-10-CM

## 2019-11-08 DIAGNOSIS — I10 Essential (primary) hypertension: Secondary | ICD-10-CM

## 2019-11-08 DIAGNOSIS — E782 Mixed hyperlipidemia: Secondary | ICD-10-CM

## 2019-11-08 NOTE — Telephone Encounter (Signed)
Pt c/o medication issue:  1. Name of Medication:  carvedilol (COREG) 25 MG tablet losartan (COZAAR) 100 MG tablet  2. How are you currently taking this medication (dosage and times per day)? Carvedilol twice a day, losartan daily  3. Are you having a reaction (difficulty breathing--STAT)? no  4. What is your medication issue? Patient believes she is over medicated. She states she BP is great, but she is very trembly.

## 2019-11-08 NOTE — Telephone Encounter (Signed)
Pt see's HTN Clinic. Will forward to Pharm-D for further follow up.

## 2019-11-08 NOTE — Telephone Encounter (Signed)
Patient reports BP reading yesterday of 128/71, did not take today.  Pulse rate ranging from 58-63.  Currently on losartan 100 mg once daily and carvedilol 25 mg BID.  Reports trembling and feels like it is her whole body.  Reported same issue back in April.  Has had no further stressers, denies anxiety. Not on caffeine.  Recommended patient try to cut her carvedilol in half and take 12.5mg  BID through the weekend to see if symptoms subside

## 2019-11-16 NOTE — Addendum Note (Signed)
Addended by: Rollen Sox on: 11/16/2019 02:51 PM   Modules accepted: Orders

## 2019-11-16 NOTE — Telephone Encounter (Signed)
Patient called and reported she feels much better on carvedilol 12.5 mg BID.  She says it feels like there is a weight taken off her chest and she is not trembling as much.  Would like to stay on 12.5mg .  Updated med list.

## 2020-01-03 NOTE — Progress Notes (Signed)
Subjective:    Patient ID: Sarah Hendricks, female    DOB: 07-27-1945, 74 y.o.   MRN: [id number]  HPI The patient is here for an acute visit.   Increased stress/anxiety: Her husband had a stroke earlier this year.  She watches her husband constantly because she is afraid he is going to fall.  He stumbles and has almost fallen.  But she states he has not fallen.  He is still not able to drive - the last time he did she was scared.  He is ok w/o driving.   Her increased anxiety and physical symptoms of increased anxiety started when he was in the hospital.  She had never felt anything like that.  She describes it as having a feeling that comes over her.  Her head and hands start to shake and she feels trembling inside.  She is taking all her medications.  Her blood pressure is controlled and much better at home.  She denies any depression.  Her husband and her both agree that she would benefit from medication to help.  She has been talking to her children about this, especially her daughter.    Medications and allergies reviewed with patient and updated if appropriate.  Patient Active Problem List   Diagnosis Date Noted  . Prediabetes 05/26/2017  . Osteopenia w/ high frax 05/30/2014  . CAD (coronary artery disease)   . Hypertension   . Hyperlipidemia   . History of breast cancer   . Vitamin D deficiency     Current Outpatient Medications on File Prior to Visit  Medication Sig Dispense Refill  . aspirin 81 MG tablet Take 81 mg by mouth daily.    . calcium carbonate (OS-CAL) 600 MG TABS tablet Take 600 mg by mouth 2 (two) times daily with a meal.     . carvedilol (COREG) 12.5 MG tablet Take 12.5 mg by mouth 2 (two) times daily with a meal.    . Cholecalciferol (EQL VITAMIN D3) 1000 units tablet Take 1 tablet (1,000 Units total) by mouth daily.    Marland Kitchen losartan (COZAAR) 100 MG tablet Take 1 tablet (100 mg total) by mouth daily. 90 tablet 3  . Omega-3 Fatty Acids (FISH OIL) 1200 MG  CAPS Take by mouth daily.    . rosuvastatin (CRESTOR) 10 MG tablet Take 1 tablet (10 mg total) by mouth daily. 30 tablet 6   No current facility-administered medications on file prior to visit.    Past Medical History:  Diagnosis Date  . Breast cancer ([place]) 1994   lumpectomy and XRT, no chemo  . CAD (coronary artery disease) 2008   PTCA, in [place]  . GERD (gastroesophageal reflux disease)   . History of chicken pox   . Hyperlipidemia   . Hypertension   . Vitamin D deficiency     Past Surgical History:  Procedure Laterality Date  . BREAST BIOPSY Left 1994  . BREAST LUMPECTOMY Left 1994  . CRANIECTOMY FOR DEPRESSED SKULL FRACTURE  1986  . HEART ATTACK  2008   STENT PLACED  . TONSILLECTOMY  1966    Social History   Socioeconomic History  . Marital status: Married    Spouse name: Not on file  . Number of children: Not on file  . Years of education: Not on file  . Highest education level: Not on file  Occupational History  . Not on file  Tobacco Use  . Smoking status: Never Smoker  . Smokeless tobacco: Never Used  Vaping Use  . Vaping Use: Never used  Substance and Sexual Activity  . Alcohol use: No  . Drug use: No  . Sexual activity: Not on file  Other Topics Concern  . Not on file  Social History Narrative   Married, lives with spouse   Retired from [employer] irregularly, rides an exercise bike   Social Determinants of Health   Financial Resource Strain:   . Difficulty of Paying Living Expenses: Not on file  Food Insecurity:   . Worried About Charity fundraiser in the Last Year: Not on file  . Ran Out of Food in the Last Year: Not on file  Transportation Needs:   . Lack of Transportation (Medical): Not on file  . Lack of Transportation (Non-Medical): Not on file  Physical Activity:   . Days of Exercise per Week: Not on file  . Minutes of Exercise per Session: Not on file  Stress:   . Feeling of Stress : Not on file  Social Connections:     . Frequency of Communication with Friends and Family: Not on file  . Frequency of Social Gatherings with Friends and Family: Not on file  . Attends Religious Services: Not on file  . Active Member of Clubs or Organizations: Not on file  . Attends Archivist Meetings: Not on file  . Marital Status: Not on file    Family History  Problem Relation Age of Onset  . Breast cancer Mother   . Hypertension Mother   . Heart disease Mother   . Stroke Mother   . Arthritis Father   . Heart disease Brother   . Stroke Maternal Grandmother   . Heart disease Maternal Grandfather     Review of Systems  Constitutional: Negative for appetite change.  Respiratory: Negative for shortness of breath.   Cardiovascular: Negative for chest pain and palpitations.  Neurological: Positive for tremors.  Psychiatric/Behavioral: Positive for sleep disturbance (when he gets up she wakes up and listens for him). Negative for dysphoric mood. The patient is nervous/anxious.        Objective:   Vitals:   01/04/20 0946  BP: (!) 148/82  Pulse: 63  Temp: 98.2 F (36.8 C)  SpO2: 97%   BP Readings from Last 3 Encounters:  01/04/20 (!) 148/82  07/26/19 (!) 192/100  06/29/19 (!) 158/82   Wt Readings from Last 3 Encounters:  01/04/20 155 lb (70.3 kg)  06/18/19 157 lb (71.2 kg)  03/13/19 159 lb 12.8 oz (72.5 kg)   Body mass index is 27.46 kg/m.   Physical Exam    Constitutional: Appears well-developed and well-nourished. No distress.  Head: Normocephalic and atraumatic.  Neck: Neck supple. No tracheal deviation present. No thyromegaly present.  No cervical lymphadenopathy Cardiovascular: Normal rate, regular rhythm and normal heart sounds.  No murmur heard. No carotid bruit .  No edema Pulmonary/Chest: Effort normal and breath sounds normal. No respiratory distress. No has no wheezes. No rales.  Skin: Skin is warm and dry. Not diaphoretic.  Psychiatric: mildly anxious mood and affect.  Behavior is normal.       Assessment & Plan:    See Problem List for Assessment and Plan of chronic medical problems.    This visit occurred during the SARS-CoV-2 public health emergency.  Safety protocols were in place, including screening questions prior to the visit, additional usage of staff PPE, and extensive cleaning of exam room while observing appropriate contact time  as indicated for disinfecting solutions.

## 2020-01-04 ENCOUNTER — Other Ambulatory Visit: Payer: Self-pay

## 2020-01-04 ENCOUNTER — Ambulatory Visit (INDEPENDENT_AMBULATORY_CARE_PROVIDER_SITE_OTHER): Payer: Medicare Other | Admitting: Internal Medicine

## 2020-01-04 ENCOUNTER — Encounter: Payer: Self-pay | Admitting: Internal Medicine

## 2020-01-04 DIAGNOSIS — F4329 Adjustment disorder with other symptoms: Secondary | ICD-10-CM | POA: Insufficient documentation

## 2020-01-04 MED ORDER — ESCITALOPRAM OXALATE 5 MG PO TABS: 5.0000 mg | ORAL_TABLET | Freq: Every day | ORAL | 5 refills | Status: DC

## 2020-01-04 NOTE — Patient Instructions (Addendum)
  Medications reviewed and updated.  Changes include :   Start lexparo 5 mg daily  Your prescription(s) have been submitted to your pharmacy. Please take as directed and contact our office if you believe you are having problem(s) with the medication(s).    Please followup in 4 weeks - this can be a virtual visit or phone call

## 2020-01-04 NOTE — Assessment & Plan Note (Addendum)
Acute Increase stress and anxiety related to her husband's stroke and now being his caregiver, doing more around the house and worrying about him Experiencing physical symptoms of increased stress and anxiety We both agree she would benefit from a daily medication Will start Lexapro 5 mg daily-we will titrate slowly as tolerated and if needed.  Discussed possible side effects Virtual follow-up in 4 weeks, but she will let me know sooner if she has any side effects or questions Discussed the importance of being able to talk to someone whether its family, friend, pastor or therapist Discussed that everything she is experiencing is very common

## 2020-01-17 ENCOUNTER — Encounter: Payer: Self-pay | Admitting: Internal Medicine

## 2020-01-17 MED ORDER — ESCITALOPRAM OXALATE 10 MG PO TABS: 10.0000 mg | ORAL_TABLET | Freq: Every day | ORAL | 1 refills | Status: DC

## 2020-01-31 DIAGNOSIS — Z0120 Encounter for dental examination and cleaning without abnormal findings: Secondary | ICD-10-CM | POA: Diagnosis not present

## 2020-02-06 MED ORDER — ESCITALOPRAM OXALATE 10 MG PO TABS: 15.0000 mg | ORAL_TABLET | Freq: Every day | ORAL | 1 refills | Status: DC

## 2020-02-06 NOTE — Addendum Note (Signed)
Addended by: Binnie Rail on: 02/06/2020 07:43 AM   Modules accepted: Orders

## 2020-02-25 ENCOUNTER — Other Ambulatory Visit: Payer: Self-pay | Admitting: Cardiology

## 2020-02-25 MED ORDER — ESCITALOPRAM OXALATE 20 MG PO TABS: 20.0000 mg | ORAL_TABLET | Freq: Every day | ORAL | 1 refills | Status: DC

## 2020-02-25 NOTE — Addendum Note (Signed)
Addended by: Binnie Rail on: 02/25/2020 08:31 AM   Modules accepted: Orders

## 2020-02-28 ENCOUNTER — Other Ambulatory Visit: Payer: Self-pay

## 2020-02-28 ENCOUNTER — Ambulatory Visit (INDEPENDENT_AMBULATORY_CARE_PROVIDER_SITE_OTHER): Payer: Medicare Other | Admitting: *Deleted

## 2020-02-28 DIAGNOSIS — Z23 Encounter for immunization: Secondary | ICD-10-CM

## 2020-02-29 ENCOUNTER — Other Ambulatory Visit: Payer: Self-pay

## 2020-02-29 MED ORDER — ROSUVASTATIN CALCIUM 10 MG PO TABS: 10.0000 mg | ORAL_TABLET | Freq: Every day | ORAL | 0 refills | Status: DC

## 2020-02-29 NOTE — Telephone Encounter (Signed)
Pt's medication rosuvastatin was called into pt's pharmacy [facility]. Pharmacist verbalized understanding.

## 2020-04-18 ENCOUNTER — Encounter: Payer: Self-pay | Admitting: Internal Medicine

## 2020-04-28 ENCOUNTER — Encounter: Payer: Self-pay | Admitting: Cardiology

## 2020-04-28 ENCOUNTER — Other Ambulatory Visit: Payer: Self-pay

## 2020-04-28 ENCOUNTER — Ambulatory Visit: Payer: Medicare Other | Admitting: Cardiology

## 2020-04-28 VITALS — BP 162/86 | HR 67 | Ht 63.0 in | Wt 152.8 lb

## 2020-04-28 DIAGNOSIS — E782 Mixed hyperlipidemia: Secondary | ICD-10-CM

## 2020-04-28 DIAGNOSIS — I10 Essential (primary) hypertension: Secondary | ICD-10-CM

## 2020-04-28 DIAGNOSIS — R600 Localized edema: Secondary | ICD-10-CM | POA: Diagnosis not present

## 2020-04-28 DIAGNOSIS — I2510 Atherosclerotic heart disease of native coronary artery without angina pectoris: Secondary | ICD-10-CM

## 2020-04-28 MED ORDER — HYDROCHLOROTHIAZIDE 25 MG PO TABS: 25.0000 mg | ORAL_TABLET | Freq: Every day | ORAL | 1 refills | Status: DC

## 2020-04-28 NOTE — Progress Notes (Signed)
Cardiology Office Note   Date:  04/28/2020   ID:  Sarah Hendricks, DOB [birth year], MRN [id number]  PCP:  Binnie Rail, MD  Cardiologist:   Ena Dawley, MD    Chief complain: 1 year follow up   History of Present Illness: Sarah Hendricks is a 74 y.o. female with history of hypertension, hyperlipidemia, CAD with stent placement in 2008, and breast cancer status post lumpectomy and XRT but no chemotherapy.  The patient is coming after a year, she has been under a lot of stress as her husband had a stroke and she is his primary caregiver.  As a result she does not get much rest, and her blood pressure has been elevated.  She has also noticed mild lower extremity edema.  She has noted heart spot behind her right knee that is not painful but is concerning her for a thrombosis.  She denies any palpitation dizziness or syncope no chest pain or shortness of breath.  Past Medical History:  Diagnosis Date  . Breast cancer ([place]) 1994   lumpectomy and XRT, no chemo  . CAD (coronary artery disease) 2008   PTCA, in [place]  . GERD (gastroesophageal reflux disease)   . History of chicken pox   . Hyperlipidemia   . Hypertension   . Vitamin D deficiency    Past Surgical History:  Procedure Laterality Date  . BREAST BIOPSY Left 1994  . BREAST LUMPECTOMY Left 1994  . CRANIECTOMY FOR DEPRESSED SKULL FRACTURE  1986  . HEART ATTACK  2008   STENT PLACED  . TONSILLECTOMY  1966     Current Outpatient Medications  Medication Sig Dispense Refill  . aspirin 81 MG tablet Take 81 mg by mouth daily.    . calcium carbonate (OS-CAL) 600 MG TABS tablet Take 600 mg by mouth 2 (two) times daily with a meal.     . carvedilol (COREG) 12.5 MG tablet Take 1 tablet (12.5 mg total) by mouth 2 (two) times daily with a meal. Please keep upcoming appt in December with Dr. Meda Coffee before anymore refills. Thank you 180 tablet 0  . Cholecalciferol (EQL VITAMIN D3) 1000 units tablet Take 1 tablet (1,000 Units total)  by mouth daily.    Marland Kitchen escitalopram (LEXAPRO) 20 MG tablet Take 1 tablet (20 mg total) by mouth daily. 90 tablet 1  . losartan (COZAAR) 100 MG tablet Take 1 tablet (100 mg total) by mouth daily. 90 tablet 3  . Omega-3 Fatty Acids (FISH OIL) 1200 MG CAPS Take by mouth daily.    . rosuvastatin (CRESTOR) 10 MG tablet Take 1 tablet (10 mg total) by mouth daily. Please keep upcoming appt in December with Dr. Meda Coffee before anymore refills. Thank you 90 tablet 0  . hydrochlorothiazide (HYDRODIURIL) 25 MG tablet Take 1 tablet (25 mg total) by mouth daily. 90 tablet 1   No current facility-administered medications for this visit.    Allergies:   Acetaminophen-codeine, Tigan [trimethobenzamide], Atorvastatin, and Codeine   Social History:  The patient  reports that she has never smoked. She has never used smokeless tobacco. She reports that she does not drink alcohol and does not use drugs.   Family History:  The patient's family history includes Arthritis in her father; Breast cancer in her mother; Heart disease in her brother, maternal grandfather, and mother; Hypertension in her mother; Stroke in her maternal grandmother and mother.    ROS:  Please see the history of present illness.   Otherwise, review of systems is  positive for none.   All other systems are reviewed and negative.    PHYSICAL EXAM: VS:  BP (!) 162/86   Pulse 67   Ht 5\' 3"  (1.6 m)   Wt 152 lb 12.8 oz (69.3 kg)   SpO2 94%   BMI 27.07 kg/m  , BMI Body mass index is 27.07 kg/m. GEN: Well nourished, well developed, in no acute distress  HEENT: normal  Neck: no JVD, carotid bruits, or masses Cardiac: RRR; no murmurs, rubs, or gallops,no edema  Respiratory:  clear to auscultation bilaterally, normal work of breathing GI: soft, nontender, nondistended, + BS MS: no deformity or atrophy  Skin: warm and dry Neuro:  Strength and sensation are intact Psych: euthymic mood, full affect  EKG:  EKG Was ordered today and was personally  reviewed, shows sinus rhythm with PACs unchanged from prior.  Recent Labs: 06/18/2019: ALT 16; Hemoglobin 14.0; Platelets 259.0; TSH 1.52 07/26/2019: BUN 10; Creatinine, Ser 0.83; Potassium 4.3; Sodium 132    Lipid Panel     Component Value Date/Time   CHOL 135 06/18/2019 0944   TRIG 174.0 (H) 06/18/2019 0944   HDL 45.10 06/18/2019 0944   CHOLHDL 3 06/18/2019 0944   VLDL 34.8 06/18/2019 0944   LDLCALC 55 06/18/2019 0944   LDLDIRECT 87.0 06/12/2018 0946   Wt Readings from Last 3 Encounters:  04/28/20 152 lb 12.8 oz (69.3 kg)  01/04/20 155 lb (70.3 kg)  06/18/19 157 lb (71.2 kg)    Other studies Reviewed: Additional studies/ records that were reviewed today include: Epic notes  EKG performed today shows normal sinus rhythm with nonspecific ST-T wave abnormalities otherwise normal EKG unchanged from prior.  This was personally reviewed.   ASSESSMENT AND PLAN:  1.  Coronary artery disease: Currently taking aspirin, atorvastatin and metoprolol. Asymptomatic.  Her EKG is normal today unchanged from prior.  She is advised to start taking exercise classes or just walk 5 days a week that will help with her coronary artery disease as well as overall wellbeing and relaxation. . 2. Hypertension: Her blood pressure is elevated, I will add hydrochlorothiazide 25 mg daily to her regimen.  She will send this diary input and we will adjust blood pressure medication further as needed.  3.  Hyperlipidemia: She is now on rosuvastatin 10 mg daily as well as fish oil, her most recent LDL was 55, HDL 45 and triglycerides 174.  4.  A lump behind her right knee -we will obtain bilateral lower extremity venous ultrasound to rule out DVT.  Labs/ tests ordered today include:  Orders Placed This Encounter  Procedures  . EKG 12-Lead  . VAS [place] LOWER EXTREMITY VENOUS (DVT)   Disposition:   FU in 4 months.  Signed, Ena Dawley, MD  04/28/2020 12:43 PM     [facility] [address] [place] [address] [phone] (office) [phone] (fax)

## 2020-04-28 NOTE — Patient Instructions (Signed)
Medication Instructions:   START TAKING HYDROCHLOROTHIAZIDE 25 MG BY MOUTH DAILY  *If you need a refill on your cardiac medications before your next appointment, please call your pharmacy*   Testing/Procedures:  Your physician has requested that you have a lower extremity venous duplex. This test is an ultrasound of the veins in the legs or arms. It looks at venous blood flow that carries blood from the heart to the legs or arms. Allow one hour for a Lower Venous exam. Allow thirty minutes for an Upper Venous exam. There are no restrictions or special instructions.   Follow-Up:  4-5 MONTHS IN THE OFFICE WITH DR. Meda Coffee

## 2020-04-29 ENCOUNTER — Telehealth: Payer: Self-pay | Admitting: Cardiology

## 2020-04-29 NOTE — Telephone Encounter (Signed)
Spoke with our Vascular dept about pts upcoming lower extremity venous US they will be doing on the pt on 05/01/20, and if this is covered or not. Per [place] in [facility] this was pre-certed already by her insurance and codes were accepted.  Daphane Shepherd states she will call the pt back and inform her of this, as well as provide her with the CPT codes to provide to her insurance carrier.

## 2020-04-29 NOTE — Telephone Encounter (Signed)
Patient called because she was unable to send Dr. Meda Coffee a message via [place]. The patient called her insurance company to make sure her renal test on 05/01/20 is covered. Her insurance company advised her to reach out to her Doctor to make sure the codes were put in correctly.

## 2020-04-30 LAB — IFOBT (OCCULT BLOOD): IFOBT: NEGATIVE

## 2020-05-01 ENCOUNTER — Other Ambulatory Visit: Payer: Self-pay

## 2020-05-01 ENCOUNTER — Ambulatory Visit (HOSPITAL_COMMUNITY)
Admission: RE | Admit: 2020-05-01 | Discharge: 2020-05-01 | Disposition: A | Discharge status: Home / Self Care | Payer: Medicare Other | Source: Ambulatory Visit | Attending: Cardiology | Admitting: Cardiology

## 2020-05-01 DIAGNOSIS — R600 Localized edema: Secondary | ICD-10-CM

## 2020-05-05 ENCOUNTER — Telehealth: Payer: Self-pay | Admitting: Cardiology

## 2020-05-05 NOTE — Telephone Encounter (Signed)
° ° °  Pt is returning call to get vascular result °

## 2020-05-05 NOTE — Telephone Encounter (Signed)
The patient has been notified of the result and verbalized understanding.  All questions (if any) were answered. Nuala Alpha, LPN 41/[id number]/3094 07:68 AM

## 2020-05-05 NOTE — Telephone Encounter (Signed)
Sarah Spark, MD  05/02/2020 11:12 PM EST      - No evidence of deep vein thrombosis in the lower extremity. No indirect evidence of obstruction proximal to the inguinal ligament. - No cystic structure found in the popliteal fossa.

## 2020-05-23 ENCOUNTER — Encounter: Payer: Self-pay | Admitting: Internal Medicine

## 2020-05-27 ENCOUNTER — Other Ambulatory Visit: Payer: Self-pay | Admitting: Cardiology

## 2020-06-18 ENCOUNTER — Encounter: Payer: Medicare Other | Admitting: Internal Medicine

## 2020-07-14 ENCOUNTER — Other Ambulatory Visit: Payer: Self-pay | Admitting: Cardiology

## 2020-08-17 NOTE — Patient Instructions (Addendum)
Blood work was ordered.     Medications changes include :   none   A referral was ordered for GI .     Someone from their office will call you to schedule an appointment.    Please followup in 1 year    Health Maintenance, Female Adopting a healthy lifestyle and getting preventive care are important in promoting health and wellness. Ask your health care provider about:  The right schedule for you to have regular tests and exams.  Things you can do on your own to prevent diseases and keep yourself healthy. What should I know about diet, weight, and exercise? Eat a healthy diet  Eat a diet that includes plenty of vegetables, fruits, low-fat dairy products, and lean protein.  Do not eat a lot of foods that are high in solid fats, added sugars, or sodium.   Maintain a healthy weight Body mass index (BMI) is used to identify weight problems. It estimates body fat based on height and weight. Your health care provider can help determine your BMI and help you achieve or maintain a healthy weight. Get regular exercise Get regular exercise. This is one of the most important things you can do for your health. Most adults should:  Exercise for at least 150 minutes each week. The exercise should increase your heart rate and make you sweat (moderate-intensity exercise).  Do strengthening exercises at least twice a week. This is in addition to the moderate-intensity exercise.  Spend less time sitting. Even light physical activity can be beneficial. Watch cholesterol and blood lipids Have your blood tested for lipids and cholesterol at 74 years of age, then have this test every 5 years. Have your cholesterol levels checked more often if:  Your lipid or cholesterol levels are high.  You are older than 74 years of age.  You are at high risk for heart disease. What should I know about cancer screening? Depending on your health history and family history, you may need to have cancer  screening at various ages. This may include screening for:  Breast cancer.  Cervical cancer.  Colorectal cancer.  Skin cancer.  Lung cancer. What should I know about heart disease, diabetes, and high blood pressure? Blood pressure and heart disease  High blood pressure causes heart disease and increases the risk of stroke. This is more likely to develop in people who have high blood pressure readings, are of African descent, or are overweight.  Have your blood pressure checked: ? Every 3-5 years if you are 72-57 years of age. ? Every year if you are 60 years old or older. Diabetes Have regular diabetes screenings. This checks your fasting blood sugar level. Have the screening done:  Once every three years after age 41 if you are at a normal weight and have a low risk for diabetes.  More often and at a younger age if you are overweight or have a high risk for diabetes. What should I know about preventing infection? Hepatitis B If you have a higher risk for hepatitis B, you should be screened for this virus. Talk with your health care provider to find out if you are at risk for hepatitis B infection. Hepatitis C Testing is recommended for:  Everyone born from [birth year] through 1964.  Anyone with known risk factors for hepatitis C. Sexually transmitted infections (STIs)  Get screened for STIs, including gonorrhea and chlamydia, if: ? You are sexually active and are younger than 74 years of age. ? You  are older than 74 years of age and your health care provider tells you that you are at risk for this type of infection. ? Your sexual activity has changed since you were last screened, and you are at increased risk for chlamydia or gonorrhea. Ask your health care provider if you are at risk.  Ask your health care provider about whether you are at high risk for HIV. Your health care provider may recommend a prescription medicine to help prevent HIV infection. If you choose to take  medicine to prevent HIV, you should first get tested for HIV. You should then be tested every 3 months for as long as you are taking the medicine. Pregnancy  If you are about to stop having your period (premenopausal) and you may become pregnant, seek counseling before you get pregnant.  Take 400 to 800 micrograms (mcg) of folic acid every day if you become pregnant.  Ask for birth control (contraception) if you want to prevent pregnancy. Osteoporosis and menopause Osteoporosis is a disease in which the bones lose minerals and strength with aging. This can result in bone fractures. If you are 8 years old or older, or if you are at risk for osteoporosis and fractures, ask your health care provider if you should:  Be screened for bone loss.  Take a calcium or vitamin D supplement to lower your risk of fractures.  Be given hormone replacement therapy (HRT) to treat symptoms of menopause. Follow these instructions at home: Lifestyle  Do not use any products that contain nicotine or tobacco, such as cigarettes, e-cigarettes, and chewing tobacco. If you need help quitting, ask your health care provider.  Do not use street drugs.  Do not share needles.  Ask your health care provider for help if you need support or information about quitting drugs. Alcohol use  Do not drink alcohol if: ? Your health care provider tells you not to drink. ? You are pregnant, may be pregnant, or are planning to become pregnant.  If you drink alcohol: ? Limit how much you use to 0-1 drink a day. ? Limit intake if you are breastfeeding.  Be aware of how much alcohol is in your drink. In the [place] one drink equals one 12 oz bottle of beer (355 mL), one 5 oz glass of wine (148 mL), or one 1 oz glass of hard liquor (44 mL). General instructions  Schedule regular health, dental, and eye exams.  Stay current with your vaccines.  Tell your health care provider if: ? You often feel depressed. ? You have  ever been abused or do not feel safe at home. Summary  Adopting a healthy lifestyle and getting preventive care are important in promoting health and wellness.  Follow your health care provider's instructions about healthy diet, exercising, and getting tested or screened for diseases.  Follow your health care provider's instructions on monitoring your cholesterol and blood pressure. This information is not intended to replace advice given to you by your health care provider. Make sure you discuss any questions you have with your health care provider. Document Revised: 04/26/2018 Document Reviewed: 04/26/2018 Elsevier Patient Education  2021 [employer].

## 2020-08-17 NOTE — Progress Notes (Addendum)
Subjective:    Patient ID: Sarah Hendricks, female    DOB: 06-07-1945, 75 y.o.   MRN: [id number]   This visit occurred during the SARS-CoV-2 public health emergency.  Safety protocols were in place, including screening questions prior to the visit, additional usage of staff PPE, and extensive cleaning of exam room while observing appropriate contact time as indicated for disinfecting solutions.    HPI She is here for a physical exam.   Her anxiety/depression is better.  She has intermittent left hand trembling. If she is driving she will have mild head or hand trembling  - she can hold the steering wheel tight it will stop.  It occurs at rest.  It goes away with activity.  The trembling is brief and transient.  It started when her husband had a stroke and she was under a lot of stress.      Medications and allergies reviewed with patient and updated if appropriate.  Patient Active Problem List   Diagnosis Date Noted  . Stress and adjustment reaction 01/04/2020  . Prediabetes 05/26/2017  . Osteopenia w/ high frax 05/30/2014  . CAD (coronary artery disease)   . Hypertension   . Hyperlipidemia   . History of breast cancer   . Vitamin D deficiency     Current Outpatient Medications on File Prior to Visit  Medication Sig Dispense Refill  . aspirin 81 MG tablet Take 81 mg by mouth daily.    . calcium carbonate (OS-CAL) 600 MG TABS tablet Take 600 mg by mouth 2 (two) times daily with a meal.     . carvedilol (COREG) 12.5 MG tablet TAKE 1 TABLET BY MOUTH 2 TIMES DAILY WITH AMEAL 180 tablet 3  . Cholecalciferol (EQL VITAMIN D3) 1000 units tablet Take 1 tablet (1,000 Units total) by mouth daily.    Marland Kitchen escitalopram (LEXAPRO) 20 MG tablet Take 1 tablet (20 mg total) by mouth daily. 90 tablet 1  . hydrochlorothiazide (HYDRODIURIL) 25 MG tablet Take 1 tablet (25 mg total) by mouth daily. 90 tablet 1  . losartan (COZAAR) 100 MG tablet TAKE 1 TABLET(100 MG) BY MOUTH DAILY 90 tablet 2  .  Omega-3 Fatty Acids (FISH OIL) 1200 MG CAPS Take by mouth daily.    . rosuvastatin (CRESTOR) 10 MG tablet TAKE 1 TABLET BY MOUTH EVERY DAY 90 tablet 3   No current facility-administered medications on file prior to visit.    Past Medical History:  Diagnosis Date  . Breast cancer ([place]) 1994   lumpectomy and XRT, no chemo  . CAD (coronary artery disease) 2008   PTCA, in [place]  . GERD (gastroesophageal reflux disease)   . History of chicken pox   . Hyperlipidemia   . Hypertension   . Vitamin D deficiency     Past Surgical History:  Procedure Laterality Date  . BREAST BIOPSY Left 1994  . BREAST LUMPECTOMY Left 1994  . CRANIECTOMY FOR DEPRESSED SKULL FRACTURE  1986  . HEART ATTACK  2008   STENT PLACED  . TONSILLECTOMY  1966    Social History   Socioeconomic History  . Marital status: Married    Spouse name: Not on file  . Number of children: Not on file  . Years of education: Not on file  . Highest education level: Not on file  Occupational History  . Not on file  Tobacco Use  . Smoking status: Never Smoker  . Smokeless tobacco: Never Used  Vaping Use  . Vaping Use: Never  used  Substance and Sexual Activity  . Alcohol use: No  . Drug use: No  . Sexual activity: Not on file  Other Topics Concern  . Not on file  Social History Narrative   Married, lives with spouse   Retired from [employer] irregularly, rides an exercise bike   Social Determinants of Radio broadcast assistant Strain: Not on file  Food Insecurity: Not on file  Transportation Needs: Not on file  Physical Activity: Not on file  Stress: Not on file  Social Connections: Not on file    Family History  Problem Relation Age of Onset  . Breast cancer Mother   . Hypertension Mother   . Heart disease Mother   . Stroke Mother   . Arthritis Father   . Heart disease Brother   . Stroke Maternal Grandmother   . Heart disease Maternal Grandfather     Review of Systems   Constitutional: Negative for chills and fever.  Eyes: Negative for visual disturbance.  Respiratory: Negative for cough, shortness of breath and wheezing.   Cardiovascular: Negative for chest pain, palpitations and leg swelling.  Gastrointestinal: Negative for abdominal pain, blood in stool, constipation, diarrhea and nausea.       No gerd  Genitourinary: Negative for dysuria and hematuria.  Musculoskeletal: Negative for arthralgias and back pain.  Skin: Negative for color change and rash.  Neurological: Negative for light-headedness and headaches.  Psychiatric/Behavioral: Positive for dysphoric mood (controlled). The patient is nervous/anxious (controlled).        Objective:   Vitals:   08/18/20 0806  BP: 136/70  Pulse: 66  Temp: 98.1 F (36.7 C)  SpO2: 97%   Filed Weights   08/18/20 0806  Weight: 151 lb (68.5 kg)   Body mass index is 26.75 kg/m.  BP Readings from Last 3 Encounters:  08/18/20 136/70  04/28/20 (!) 162/86  01/04/20 (!) 148/82    Wt Readings from Last 3 Encounters:  08/18/20 151 lb (68.5 kg)  04/28/20 152 lb 12.8 oz (69.3 kg)  01/04/20 155 lb (70.3 kg)    Depression screen [facility] 2/9 08/18/2020 06/18/2019 06/12/2018 05/25/2017 05/30/2014  Decreased Interest 0 0 0 0 0  Down, Depressed, Hopeless 0 0 0 0 0  PHQ - 2 Score 0 0 0 0 0  Altered sleeping 0 - - - -  Tired, decreased energy 0 - - - -  Change in appetite 0 - - - -  Feeling bad or failure about yourself  0 - - - -  Trouble concentrating 0 - - - -  Moving slowly or fidgety/restless 0 - - - -  Suicidal thoughts 0 - - - -  PHQ-9 Score 0 - - - -    GAD 7 : Generalized Anxiety Score 08/18/2020  Nervous, Anxious, on Edge 1  Control/stop worrying 0  Worry too much - different things 0  Trouble relaxing 0  Restless 0  Easily annoyed or irritable 0  Afraid - awful might happen 0  Total GAD 7 Score 1  Anxiety Difficulty Not difficult at all       Physical Exam Constitutional: She appears  well-developed and well-nourished. No distress.  HENT:  Head: Normocephalic and atraumatic.  Right Ear: External ear normal. Normal ear canal and TM Left Ear: External ear normal.  Normal ear canal and TM Mouth/Throat: Oropharynx is clear and moist.  Eyes: Conjunctivae and EOM are normal.  Neck: Neck supple. No tracheal deviation  present. No thyromegaly present.  No carotid bruit  Cardiovascular: Normal rate, regular rhythm and normal heart sounds.   No murmur heard.  No edema. Pulmonary/Chest: Effort normal and breath sounds normal. No respiratory distress. She has no wheezes. She has no rales.  Breast: deferred   Abdominal: Soft. She exhibits no distension. There is no tenderness.  Lymphadenopathy: She has no cervical adenopathy.  Skin: Skin is warm and dry. She is not diaphoretic.  Psychiatric: She has a normal mood and affect. Her behavior is normal.        Assessment & Plan:   Physical exam: Screening blood work    ordered Immunizations  Discussed covid booster, shingrix Colonoscopy  Due - referred  Mammogram   Up to date  Gyn    n/a Dexa  Up to date  Eye exams  Up to date  Exercise  Gardening, house work [employer]  Ok for age Substance abuse  none      See Problem List for Assessment and Plan of chronic medical problems.

## 2020-08-18 ENCOUNTER — Ambulatory Visit (INDEPENDENT_AMBULATORY_CARE_PROVIDER_SITE_OTHER): Payer: Medicare Other | Admitting: Internal Medicine

## 2020-08-18 ENCOUNTER — Encounter: Payer: Self-pay | Admitting: Internal Medicine

## 2020-08-18 ENCOUNTER — Other Ambulatory Visit: Payer: Self-pay

## 2020-08-18 VITALS — BP 136/70 | HR 66 | Temp 98.1°F | Ht 63.0 in | Wt 151.0 lb

## 2020-08-18 DIAGNOSIS — R7303 Prediabetes: Secondary | ICD-10-CM

## 2020-08-18 DIAGNOSIS — Z1211 Encounter for screening for malignant neoplasm of colon: Secondary | ICD-10-CM

## 2020-08-18 DIAGNOSIS — M85862 Other specified disorders of bone density and structure, left lower leg: Secondary | ICD-10-CM

## 2020-08-18 DIAGNOSIS — I2510 Atherosclerotic heart disease of native coronary artery without angina pectoris: Secondary | ICD-10-CM

## 2020-08-18 DIAGNOSIS — F4329 Adjustment disorder with other symptoms: Secondary | ICD-10-CM

## 2020-08-18 DIAGNOSIS — M85861 Other specified disorders of bone density and structure, right lower leg: Secondary | ICD-10-CM

## 2020-08-18 DIAGNOSIS — I10 Essential (primary) hypertension: Secondary | ICD-10-CM

## 2020-08-18 DIAGNOSIS — Z0000 Encounter for general adult medical examination without abnormal findings: Secondary | ICD-10-CM | POA: Diagnosis not present

## 2020-08-18 DIAGNOSIS — E7849 Other hyperlipidemia: Secondary | ICD-10-CM | POA: Diagnosis not present

## 2020-08-18 LAB — CBC WITH DIFFERENTIAL/PLATELET
Basophils Absolute: 0.1 10*3/uL (ref 0.0–0.1)
Basophils Relative: 1 % (ref 0.0–3.0)
Eosinophils Absolute: 0.2 10*3/uL (ref 0.0–0.7)
Eosinophils Relative: 3.3 % (ref 0.0–5.0)
HCT: 38.3 % (ref 36.0–46.0)
Hemoglobin: 13.5 g/dL (ref 12.0–15.0)
Lymphocytes Relative: 25.3 % (ref 12.0–46.0)
Lymphs Abs: 1.5 10*3/uL (ref 0.7–4.0)
MCHC: 35.1 g/dL (ref 30.0–36.0)
MCV: 92.6 fl (ref 78.0–100.0)
Monocytes Absolute: 0.6 10*3/uL (ref 0.1–1.0)
Monocytes Relative: 10.9 % (ref 3.0–12.0)
Neutro Abs: 3.4 10*3/uL (ref 1.4–7.7)
Neutrophils Relative %: 59.5 % (ref 43.0–77.0)
Platelets: 256 10*3/uL (ref 150.0–400.0)
RBC: 4.14 Mil/uL (ref 3.87–5.11)
RDW: 13.2 % (ref 11.5–15.5)
WBC: 5.8 10*3/uL (ref 4.0–10.5)

## 2020-08-18 LAB — LIPID PANEL
Cholesterol: 125 mg/dL (ref 0–200)
HDL: 48.3 mg/dL (ref >39.00)
LDL Cholesterol: 56 mg/dL (ref 0–99)
NonHDL: 76.31
Total CHOL/HDL Ratio: 3
Triglycerides: 103 mg/dL (ref 0.0–149.0)
VLDL: 20.6 mg/dL (ref 0.0–40.0)

## 2020-08-18 LAB — COMPREHENSIVE METABOLIC PANEL
ALT: 13 U/L (ref 0–35)
AST: 17 U/L (ref 0–37)
Albumin: 4.3 g/dL (ref 3.5–5.2)
Alkaline Phosphatase: 65 U/L (ref 39–117)
BUN: 14 mg/dL (ref 6–23)
CO2: 29 mEq/L (ref 19–32)
Calcium: 9.4 mg/dL (ref 8.4–10.5)
Chloride: 93 mEq/L — ABNORMAL LOW (ref 96–112)
Creatinine, Ser: 0.84 mg/dL (ref 0.40–1.20)
GFR: 68.9 mL/min (ref >60.00)
Glucose, Bld: 102 mg/dL — ABNORMAL HIGH (ref 70–99)
Potassium: 3.7 mEq/L (ref 3.5–5.1)
Sodium: 128 mEq/L — ABNORMAL LOW (ref 135–145)
Total Bilirubin: 0.7 mg/dL (ref 0.2–1.2)
Total Protein: 7 g/dL (ref 6.0–8.3)

## 2020-08-18 LAB — HEMOGLOBIN A1C: Hgb A1c MFr Bld: 5.8 % (ref 4.6–6.5)

## 2020-08-18 NOTE — Assessment & Plan Note (Addendum)
Chronic Screening for depression using phq9 and anxiety screening with GAD7 show both are well controlled Controlled, stable Continue lexapro 20 mg daily

## 2020-08-18 NOTE — Assessment & Plan Note (Signed)
Chronic No symptoms c/w angina Continue ASA 81 mg daily, coreg 12.5 mg BID, crestor 10 mg daily Encouraged regular exercise, healthy diet

## 2020-08-18 NOTE — Addendum Note (Signed)
Addended by: Boris Lown B on: 08/18/2020 08:44 AM   Modules accepted: Orders

## 2020-08-18 NOTE — Assessment & Plan Note (Signed)
Chronic Check a1c Low sugar / carb diet Stressed regular exercise  

## 2020-08-18 NOTE — Assessment & Plan Note (Signed)
Chronic dexa up to date Stressed regular exercise Continue calcium and vitamin d daily

## 2020-08-18 NOTE — Assessment & Plan Note (Signed)
Chronic Check lipid panel  Continue crestor 10 mg daily Regular exercise and healthy diet encouraged  

## 2020-08-18 NOTE — Assessment & Plan Note (Signed)
Chronic BP well controlled Continue coreg 12.5 mg bid, hctz 25 mg qd, losartan 100 mg qd cmp

## 2020-08-20 ENCOUNTER — Other Ambulatory Visit: Payer: Self-pay | Admitting: Internal Medicine

## 2020-08-25 ENCOUNTER — Encounter: Payer: Self-pay | Admitting: Gastroenterology

## 2020-08-27 ENCOUNTER — Other Ambulatory Visit: Payer: Self-pay

## 2020-08-27 ENCOUNTER — Ambulatory Visit (AMBULATORY_SURGERY_CENTER): Payer: Medicare Other

## 2020-08-27 VITALS — Ht 63.0 in | Wt 150.0 lb

## 2020-08-27 DIAGNOSIS — Z1211 Encounter for screening for malignant neoplasm of colon: Secondary | ICD-10-CM

## 2020-08-27 MED ORDER — PEG 3350-KCL-NA BICARB-NACL 420 G PO SOLR: 4000.0000 mL | Freq: Once | ORAL | 0 refills | Status: AC

## 2020-08-27 NOTE — Progress Notes (Signed)
Virtual PV  No egg or soy allergy known to patient  No issues with past sedation with any surgeries or procedures Patient denies ever being told they had issues or difficulty with intubation  No FH of Malignant Hyperthermia No diet pills per patient No home 02 use per patient  No blood thinners per patient  Pt denies issues with constipation  No A fib or A flutter  EMMI video to pt or via [place] 19 guidelines implemented in PV today with Pt and RN  Pt is fully vaccinated  for Covid   Due to the COVID-19 pandemic we are asking patients to follow certain guidelines.  Pt aware of COVID protocols and [facility] guidelines

## 2020-09-03 ENCOUNTER — Encounter: Payer: Self-pay | Admitting: Gastroenterology

## 2020-09-04 ENCOUNTER — Telehealth: Payer: Self-pay | Admitting: Gastroenterology

## 2020-09-04 ENCOUNTER — Encounter: Payer: Self-pay | Admitting: Internal Medicine

## 2020-09-04 DIAGNOSIS — Z1211 Encounter for screening for malignant neoplasm of colon: Secondary | ICD-10-CM

## 2020-09-04 MED ORDER — PEG 3350-KCL-NA BICARB-NACL 420 G PO SOLR: 4000.0000 mL | Freq: Once | ORAL | 0 refills | Status: AC

## 2020-09-04 NOTE — Telephone Encounter (Signed)
New RX sent to pharmacy for the [place]- patient is aware. Patient does understands when to mix this.

## 2020-09-04 NOTE — Telephone Encounter (Signed)
Patient called and said she already mixed her prep medication but she is not to start it until Monday will need another prep sent to the pharmacy for her.

## 2020-09-08 ENCOUNTER — Ambulatory Visit: Payer: Medicare Other | Admitting: Cardiology

## 2020-09-09 ENCOUNTER — Encounter: Payer: Self-pay | Admitting: Gastroenterology

## 2020-09-09 ENCOUNTER — Other Ambulatory Visit: Payer: Self-pay

## 2020-09-09 ENCOUNTER — Ambulatory Visit (AMBULATORY_SURGERY_CENTER): Payer: Medicare Other | Admitting: Gastroenterology

## 2020-09-09 VITALS — BP 142/57 | HR 64 | Temp 96.6°F | Resp 19 | Ht 63.0 in | Wt 150.0 lb

## 2020-09-09 DIAGNOSIS — Z1211 Encounter for screening for malignant neoplasm of colon: Secondary | ICD-10-CM

## 2020-09-09 MED ORDER — SODIUM CHLORIDE 0.9 % IV SOLN: 500.0000 mL | INTRAVENOUS | Status: DC

## 2020-09-09 NOTE — Op Note (Signed)
[facility] Patient Name: Sarah Hendricks Procedure Date: 09/09/2020 7:16 AM MRN: [id number] Endoscopist: Thornton Park MD, MD Age: 75 Referring MD:  Date of Birth: 1945/08/20 Gender: Female Account #: [id number] Procedure:                Colonoscopy Indications:              Screening for colorectal malignant neoplasm                           Normal colonoscopy >10 years ago in [place]                           No known family history of colon cancer or polyps Medicines:                Monitored Anesthesia Care Procedure:                Pre-Anesthesia Assessment:                           - Prior to the procedure, a History and Physical                            was performed, and patient medications and                            allergies were reviewed. The patient's tolerance of                            previous anesthesia was also reviewed. The risks                            and benefits of the procedure and the sedation                            options and risks were discussed with the patient.                            All questions were answered, and informed consent                            was obtained. Prior Anticoagulants: The patient has                            taken no previous anticoagulant or antiplatelet                            agents. ASA Grade Assessment: III - A patient with                            severe systemic disease. After reviewing the risks                            and benefits, the patient was deemed in  satisfactory condition to undergo the procedure.                           After obtaining informed consent, the colonoscope                            was passed under direct vision. Throughout the                            procedure, the patient's blood pressure, pulse, and                            oxygen saturations were monitored continuously. The                            Olympus  CF-HQ190L ([id number]) Colonoscope was                            introduced through the anus and advanced to the 3                            cm into the ileum. A second forward view of the                            right colon was performed. The colonoscopy was                            performed without difficulty. The patient tolerated                            the procedure well. The quality of the bowel                            preparation was good. The terminal ileum, ileocecal                            valve, appendiceal orifice, and rectum were                            photographed. Scope In: 8:12:57 AM Scope Out: 8:29:14 AM Scope Withdrawal Time: 0 hours 11 minutes 36 seconds  Total Procedure Duration: 0 hours 16 minutes 17 seconds  Findings:                 Non-bleeding internal hemorrhoids were found.                           Multiple small and large-mouthed diverticula were                            found in the sigmoid colon and descending colon.                           The exam was otherwise without abnormality on  direct and retroflexion views. Complications:            No immediate complications. Estimated Blood Loss:     Estimated blood loss: none. Impression:               - Non-bleeding internal hemorrhoids.                           - Diverticulosis in the sigmoid colon and in the                            descending colon.                           - The examination was otherwise normal on direct                            and retroflexion views.                           - No specimens collected. Recommendation:           - Patient has a contact number available for                            emergencies. The signs and symptoms of potential                            delayed complications were discussed with the                            patient. Return to normal activities tomorrow.                            Written  discharge instructions were provided to the                            patient.                           - High fiber diet recommended.                           - Continue present medications.                           - Repeat colonoscopy in 10 years for surveillance                            if it is clinically appropriate at that time.                            Between the ages of 39 and 35, we carefully weight                            the risks of the procedure with the benefits of  prevention.                           - Emerging evidence supports eating a diet of                            fruits, vegetables, grains, calcium, and yogurt                            while reducing red meat and alcohol may reduce the                            risk of colon cancer.                           - Thank you for allowing me to be involved in your                            colon cancer prevention. Thornton Park MD, MD 09/09/2020 8:38:22 AM This report has been signed electronically.

## 2020-09-09 NOTE — Patient Instructions (Signed)
Handout given:  Hemorrhoids, Diverticulosis, HIgh fiber diet Start a higher fiber diet Continue current medications  YOU HAD AN ENDOSCOPIC PROCEDURE TODAY AT [facility]:   Refer to the procedure report that was given to you for any specific questions about what was found during the examination.  If the procedure report does not answer your questions, please call your gastroenterologist to clarify.  If you requested that your care partner not be given the details of your procedure findings, then the procedure report has been included in a sealed envelope for you to review at your convenience later.  YOU SHOULD EXPECT: Some feelings of bloating in the abdomen. Passage of more gas than usual.  Walking can help get rid of the air that was put into your GI tract during the procedure and reduce the bloating. If you had a lower endoscopy (such as a colonoscopy or flexible sigmoidoscopy) you may notice spotting of blood in your stool or on the toilet paper. If you underwent a bowel prep for your procedure, you may not have a normal bowel movement for a few days.  Please Note:  You might notice some irritation and congestion in your nose or some drainage.  This is from the oxygen used during your procedure.  There is no need for concern and it should clear up in a day or so.  SYMPTOMS TO REPORT IMMEDIATELY:   Following lower endoscopy (colonoscopy or flexible sigmoidoscopy):  Excessive amounts of blood in the stool  Significant tenderness or worsening of abdominal pains  Swelling of the abdomen that is new, acute  Fever of 100F or higher  For urgent or emergent issues, a gastroenterologist can be reached at any hour by calling [phone]. Do not use MyChart messaging for urgent concerns.    DIET:  We do recommend a small meal at first, but then you may proceed to your regular diet.  Drink plenty of fluids but you should avoid alcoholic beverages for 24 hours.  ACTIVITY:  You  should plan to take it easy for the rest of today and you should NOT DRIVE or use heavy machinery until tomorrow (because of the sedation medicines used during the test).    FOLLOW UP: Our staff will call the number listed on your records 48-72 hours following your procedure to check on you and address any questions or concerns that you may have regarding the information given to you following your procedure. If we do not reach you, we will leave a message.  We will attempt to reach you two times.  During this call, we will ask if you have developed any symptoms of COVID 19. If you develop any symptoms (ie: fever, flu-like symptoms, shortness of breath, cough etc.) before then, please call [phone].  If you test positive for Covid 19 in the 2 weeks post procedure, please call and report this information to [place].    If any biopsies were taken you will be contacted by phone or by letter within the next 1-3 weeks.  Please call us at [phone] if you have not heard about the biopsies in 3 weeks.    SIGNATURES/CONFIDENTIALITY: You and/or your care partner have signed paperwork which will be entered into your electronic medical record.  These signatures attest to the fact that that the information above on your After Visit Summary has been reviewed and is understood.  Full responsibility of the confidentiality of this discharge information lies with you and/or your care-partner.

## 2020-09-09 NOTE — Progress Notes (Signed)
PT taken to PACU. Monitors in place. VSS. Report given to RN. 

## 2020-09-09 NOTE — Progress Notes (Signed)
Pt's states no medical or surgical changes since previsit or office visit.  Kw vitals

## 2020-09-11 ENCOUNTER — Telehealth: Payer: Self-pay | Admitting: *Deleted

## 2020-09-11 NOTE — Telephone Encounter (Signed)
  Follow up Call-  Call back number 09/09/2020  Post procedure Call Back phone  # [phone]  Permission to leave phone message Yes  Some recent data might be hidden     Patient questions:  Do you have a fever, pain , or abdominal swelling? No. Pain Score  0   Have you tolerated food without any problems? Yes.    Have you been able to return to your normal activities? Yes.    Do you have any questions about your discharge instructions: Diet   No. Medications  No. Follow up visit  No.  Do you have questions or concerns about your Care? No.  Actions: * If pain score is 4 or above: 1. No action needed, pain <4.Have you developed a fever since your procedure? no  2.   Have you had an respiratory symptoms (SOB or cough) since your procedure? no  3.   Have you tested positive for COVID 19 since your procedure no  4.   Have you had any family members/close contacts diagnosed with the COVID 19 since your procedure?  no   If yes to any of these questions please route to Joylene John, RN and Joella Prince, RN

## 2020-09-16 ENCOUNTER — Ambulatory Visit: Payer: Medicare Other

## 2020-09-18 ENCOUNTER — Ambulatory Visit: Payer: Medicare Other

## 2020-09-24 ENCOUNTER — Other Ambulatory Visit: Payer: Self-pay | Admitting: Internal Medicine

## 2020-09-24 DIAGNOSIS — Z1231 Encounter for screening mammogram for malignant neoplasm of breast: Secondary | ICD-10-CM

## 2020-09-29 ENCOUNTER — Ambulatory Visit (INDEPENDENT_AMBULATORY_CARE_PROVIDER_SITE_OTHER): Payer: Medicare Other

## 2020-09-29 ENCOUNTER — Other Ambulatory Visit: Payer: Self-pay

## 2020-09-29 DIAGNOSIS — Z0000 Encounter for general adult medical examination without abnormal findings: Secondary | ICD-10-CM

## 2020-09-29 NOTE — Patient Instructions (Signed)
Sarah Hendricks , Thank you for taking time to come for your Medicare Wellness Visit. I appreciate your ongoing commitment to your health goals. Please review the following plan we discussed and let me know if I can assist you in the future.   Screening recommendations/referrals: Colonoscopy: 09/09/2020; due every 10 years Mammogram: 11/08/2019 Bone Density: 06/27/2019 Recommended yearly ophthalmology/optometry visit for glaucoma screening and checkup Recommended yearly dental visit for hygiene and checkup  Vaccinations: Influenza vaccine: 02/28/2020 Pneumococcal vaccine: 09/26/2013, 05/30/2014 Tdap vaccine: 02/13/2018 due every 10 years Shingles vaccine: never done   Covid-19: 07/13/2019, 08/10/2019, 05/22/2020  Advanced directives: Advance directive discussed with you today. Even though you declined this today please call our office should you change your mind and we can give you the proper paperwork for you to fill out.  Conditions/risks identified: Yes; Reviewed health maintenance screenings with patient today and relevant education, vaccines, and/or referrals were provided. Please continue to do your personal lifestyle choices by: daily care of teeth and gums, regular physical activity (goal should be 5 days a week for 30 minutes), eat a healthy diet, avoid tobacco and drug use, limiting any alcohol intake, taking a low-dose aspirin (if not allergic or have been advised by your provider otherwise) and taking vitamins and minerals as recommended by your provider. Continue doing brain stimulating activities (puzzles, reading, adult coloring books, staying active) to keep memory sharp. Continue to eat heart healthy diet (full of fruits, vegetables, whole grains, lean protein, water--limit salt, fat, and sugar intake) and increase physical activity as tolerated.  Next appointment: Please schedule your next Medicare Wellness Visit with your Nurse Health Advisor in 1 year by calling  [phone].  Preventive Care 55 Years and Older, Female Preventive care refers to lifestyle choices and visits with your health care provider that can promote health and wellness. What does preventive care include?  A yearly physical exam. This is also called an annual well check.  Dental exams once or twice a year.  Routine eye exams. Ask your health care provider how often you should have your eyes checked.  Personal lifestyle choices, including:  Daily care of your teeth and gums.  Regular physical activity.  Eating a healthy diet.  Avoiding tobacco and drug use.  Limiting alcohol use.  Practicing safe sex.  Taking low-dose aspirin every day.  Taking vitamin and mineral supplements as recommended by your health care provider. What happens during an annual well check? The services and screenings done by your health care provider during your annual well check will depend on your age, overall health, lifestyle risk factors, and family history of disease. Counseling  Your health care provider may ask you questions about your:  Alcohol use.  Tobacco use.  Drug use.  Emotional well-being.  Home and relationship well-being.  Sexual activity.  Eating habits.  History of falls.  Memory and ability to understand (cognition).  Work and work Statistician.  Reproductive health. Screening  You may have the following tests or measurements:  Height, weight, and BMI.  Blood pressure.  Lipid and cholesterol levels. These may be checked every 5 years, or more frequently if you are over 28 years old.  Skin check.  Lung cancer screening. You may have this screening every year starting at age 55 if you have a 30-pack-year history of smoking and currently smoke or have quit within the past 15 years.  Fecal occult blood test (FOBT) of the stool. You may have this test every year starting at age 50.  Flexible sigmoidoscopy or colonoscopy. You may have a sigmoidoscopy  every 5 years or a colonoscopy every 10 years starting at age 60.  Hepatitis C blood test.  Hepatitis B blood test.  Sexually transmitted disease (STD) testing.  Diabetes screening. This is done by checking your blood sugar (glucose) after you have not eaten for a while (fasting). You may have this done every 1-3 years.  Bone density scan. This is done to screen for osteoporosis. You may have this done starting at age 47.  Mammogram. This may be done every 1-2 years. Talk to your health care provider about how often you should have regular mammograms. Talk with your health care provider about your test results, treatment options, and if necessary, the need for more tests. Vaccines  Your health care provider may recommend certain vaccines, such as:  Influenza vaccine. This is recommended every year.  Tetanus, diphtheria, and acellular pertussis (Tdap, Td) vaccine. You may need a Td booster every 10 years.  Zoster vaccine. You may need this after age 75.  Pneumococcal 13-valent conjugate (PCV13) vaccine. One dose is recommended after age 42.  Pneumococcal polysaccharide (PPSV23) vaccine. One dose is recommended after age 26. Talk to your health care provider about which screenings and vaccines you need and how often you need them. This information is not intended to replace advice given to you by your health care provider. Make sure you discuss any questions you have with your health care provider. Document Released: 05/30/2015 Document Revised: 01/21/2016 Document Reviewed: 03/04/2015 Elsevier Interactive Patient Education  2017 [place] Prevention in the Home Falls can cause injuries. They can happen to people of all ages. There are many things you can do to make your home safe and to help prevent falls. What can I do on the outside of my home?  Regularly fix the edges of walkways and driveways and fix any cracks.  Remove anything that might make you trip as you walk  through a door, such as a raised step or threshold.  Trim any bushes or trees on the path to your home.  Use bright outdoor lighting.  Clear any walking paths of anything that might make someone trip, such as rocks or tools.  Regularly check to see if handrails are loose or broken. Make sure that both sides of any steps have handrails.  Any raised decks and porches should have guardrails on the edges.  Have any leaves, snow, or ice cleared regularly.  Use sand or salt on walking paths during winter.  Clean up any spills in your garage right away. This includes oil or grease spills. What can I do in the bathroom?  Use night lights.  Install grab bars by the toilet and in the tub and shower. Do not use towel bars as grab bars.  Use non-skid mats or decals in the tub or shower.  If you need to sit down in the shower, use a plastic, non-slip stool.  Keep the floor dry. Clean up any water that spills on the floor as soon as it happens.  Remove soap buildup in the tub or shower regularly.  Attach bath mats securely with double-sided non-slip rug tape.  Do not have throw rugs and other things on the floor that can make you trip. What can I do in the bedroom?  Use night lights.  Make sure that you have a light by your bed that is easy to reach.  Do not use any sheets or blankets  that are too big for your bed. They should not hang down onto the floor.  Have a firm chair that has side arms. You can use this for support while you get dressed.  Do not have throw rugs and other things on the floor that can make you trip. What can I do in the kitchen?  Clean up any spills right away.  Avoid walking on wet floors.  Keep items that you use a lot in easy-to-reach places.  If you need to reach something above you, use a strong step stool that has a grab bar.  Keep electrical cords out of the way.  Do not use floor polish or wax that makes floors slippery. If you must use wax,  use non-skid floor wax.  Do not have throw rugs and other things on the floor that can make you trip. What can I do with my stairs?  Do not leave any items on the stairs.  Make sure that there are handrails on both sides of the stairs and use them. Fix handrails that are broken or loose. Make sure that handrails are as long as the stairways.  Check any carpeting to make sure that it is firmly attached to the stairs. Fix any carpet that is loose or worn.  Avoid having throw rugs at the top or bottom of the stairs. If you do have throw rugs, attach them to the floor with carpet tape.  Make sure that you have a light switch at the top of the stairs and the bottom of the stairs. If you do not have them, ask someone to add them for you. What else can I do to help prevent falls?  Wear shoes that:  Do not have high heels.  Have rubber bottoms.  Are comfortable and fit you well.  Are closed at the toe. Do not wear sandals.  If you use a stepladder:  Make sure that it is fully opened. Do not climb a closed stepladder.  Make sure that both sides of the stepladder are locked into place.  Ask someone to hold it for you, if possible.  Clearly mark and make sure that you can see:  Any grab bars or handrails.  First and last steps.  Where the edge of each step is.  Use tools that help you move around (mobility aids) if they are needed. These include:  Canes.  Walkers.  Scooters.  Crutches.  Turn on the lights when you go into a dark area. Replace any light bulbs as soon as they burn out.  Set up your furniture so you have a clear path. Avoid moving your furniture around.  If any of your floors are uneven, fix them.  If there are any pets around you, be aware of where they are.  Review your medicines with your doctor. Some medicines can make you feel dizzy. This can increase your chance of falling. Ask your doctor what other things that you can do to help prevent  falls. This information is not intended to replace advice given to you by your health care provider. Make sure you discuss any questions you have with your health care provider. Document Released: 02/27/2009 Document Revised: 10/09/2015 Document Reviewed: 06/07/2014 Elsevier Interactive Patient Education  2017 [employer].

## 2020-09-29 NOTE — Progress Notes (Signed)
I connected with Sarah Hendricks today by telephone and verified that I am speaking with the correct person using two identifiers. Location patient: home Location provider: work Persons participating in the virtual [place] [employer], [place].   I discussed the limitations, risks, security and privacy concerns of performing an evaluation and management service by telephone and the availability of in person appointments. I also discussed with the patient that there may be a patient responsible charge related to this service. The patient expressed understanding and verbally consented to this telephonic visit.    Interactive audio and video telecommunications were attempted between this provider and patient, however failed, due to patient having technical difficulties OR patient did not have access to video capability.  We continued and completed visit with audio only.  Some vital signs may be absent or patient reported.   Time Spent with patient on telephone encounter: 30 minutes  Subjective:   Sarah Hendricks is a 75 y.o. female who presents for Medicare Annual (Subsequent) preventive examination.  Review of Systems    No ROS. Medicare Wellness Virtual Visit. Additional risk factors are reflected in social history. Cardiac Risk Factors include: advanced age (>80men, >58 women);dyslipidemia;family history of premature cardiovascular disease;hypertension     Objective:    There were no vitals filed for this visit. There is no height or weight on file to calculate BMI.  Advanced Directives 09/29/2020  Does Patient Have a Medical Advance Directive? No  Would patient like information on creating a medical advance directive? No - Patient declined    Current Medications (verified) Outpatient Encounter Medications as of 09/29/2020  Medication Sig  . aspirin 81 MG tablet Take 81 mg by mouth daily.  . calcium carbonate (OS-CAL) 600 MG TABS tablet Take 600 mg by mouth  2 (two) times daily with a meal.   . carvedilol (COREG) 12.5 MG tablet TAKE 1 TABLET BY MOUTH 2 TIMES DAILY WITH AMEAL  . Cholecalciferol (EQL VITAMIN D3) 1000 units tablet Take 1 tablet (1,000 Units total) by mouth daily.  Marland Kitchen escitalopram (LEXAPRO) 20 MG tablet TAKE 1 TABLET(20 MG) BY MOUTH DAILY  . hydrochlorothiazide (HYDRODIURIL) 25 MG tablet Take 1 tablet (25 mg total) by mouth daily.  Marland Kitchen losartan (COZAAR) 100 MG tablet TAKE 1 TABLET(100 MG) BY MOUTH DAILY  . Omega-3 Fatty Acids (FISH OIL) 1200 MG CAPS Take by mouth daily.  . rosuvastatin (CRESTOR) 10 MG tablet TAKE 1 TABLET BY MOUTH EVERY DAY   No facility-administered encounter medications on file as of 09/29/2020.    Allergies (verified) Acetaminophen-codeine, Tigan [trimethobenzamide], Atorvastatin, and Codeine   History: Past Medical History:  Diagnosis Date  . Allergy   . Arthritis   . Breast cancer ([place]) 1994   lumpectomy and XRT, no chemo  . CAD (coronary artery disease) 2008   PTCA, in [place]  . GERD (gastroesophageal reflux disease)   . History of chicken pox   . Hyperlipidemia   . Hypertension   . Myocardial infarction ([place]) 03/15/2007   stent placed  . Vitamin D deficiency    Past Surgical History:  Procedure Laterality Date  . BREAST BIOPSY Left 1994  . BREAST LUMPECTOMY Left 1994  . CORONARY ANGIOPLASTY WITH STENT PLACEMENT  2008  . CRANIECTOMY FOR DEPRESSED SKULL FRACTURE  1986  . TONSILLECTOMY  1966   Family History  Problem Relation Age of Onset  . Breast cancer Mother   . Hypertension Mother   . Heart disease Mother   . Stroke Mother   .  Arthritis Father   . Heart disease Father        died at 54 of heart attack  . Heart disease Brother   . Stroke Maternal Grandmother   . Heart disease Maternal Grandfather   . Colon cancer Neg Hx   . Esophageal cancer Neg Hx   . Rectal cancer Neg Hx   . Stomach cancer Neg Hx    Social History   Socioeconomic History  . Marital status: Married    Spouse  name: Not on file  . Number of children: Not on file  . Years of education: Not on file  . Highest education level: Not on file  Occupational History  . Not on file  Tobacco Use  . Smoking status: Never Smoker  . Smokeless tobacco: Never Used  Vaping Use  . Vaping Use: Never used  Substance and Sexual Activity  . Alcohol use: No  . Drug use: No  . Sexual activity: Not on file  Other Topics Concern  . Not on file  Social History Narrative   Married, lives with spouse   Retired from [employer] irregularly, rides an exercise bike   Social Determinants of Health   Financial Resource Strain: [place]   . Difficulty of Paying Living Expenses: Not hard at all  Food Insecurity: No Food Insecurity  . Worried About Charity fundraiser in the Last Year: Never true  . Ran Out of Food in the Last Year: Never true  Transportation Needs: No Transportation Needs  . Lack of Transportation (Medical): No  . Lack of Transportation (Non-Medical): No  Physical Activity: Sufficiently Active  . Days of Exercise per Week: 5 days  . Minutes of Exercise per Session: 30 min  Stress: No Stress Concern Present  . Feeling of Stress : Not at all  Social Connections: Socially Integrated  . Frequency of Communication with Friends and Family: More than three times a week  . Frequency of Social Gatherings with Friends and Family: Twice a week  . Attends Religious Services: More than 4 times per year  . Active Member of Clubs or Organizations: No  . Attends Archivist Meetings: More than 4 times per year  . Marital Status: Married    Tobacco Counseling Counseling given: Not Answered   Clinical Intake:  Pre-visit preparation completed: Yes  Pain : No/denies pain     Nutritional Risks: None Diabetes: No  How often do you need to have someone help you when you read instructions, pamphlets, or other written materials from your doctor or pharmacy?: 1 - Never What is the  last grade level you completed in school?: High School Graduate  Diabetic? no  Interpreter Needed?: No  Information entered by :: Lisette Abu, LPN   Activities of Daily Living In your present state of health, do you have any difficulty performing the following activities: 09/29/2020 08/18/2020  Hearing? N N  Vision? N N  Difficulty concentrating or making decisions? N N  Walking or climbing stairs? N N  Dressing or bathing? N N  Doing errands, shopping? N N  Preparing Food and eating ? N -  Using the Toilet? N -  In the past six months, have you accidently leaked urine? N -  Do you have problems with loss of bowel control? N -  Managing your Medications? N -  Managing your Finances? N -  Housekeeping or managing your Housekeeping? N -  Some recent data might  be hidden    Patient Care Team: Pincus Sanes, MD as PCP - General (Internal Medicine) Lars Masson, MD (Inactive) as PCP - Cardiology (Cardiology)  Indicate any recent Medical Services you may have received from other than [facility] providers in the past year (date may be approximate).     Assessment:   This is a routine wellness examination for Sarah Hendricks.  Hearing/Vision screen No exam data present  Dietary issues and exercise activities discussed: Current Exercise Habits: Home exercise routine, Time (Minutes): 30, Frequency (Times/Week): 5, Weekly Exercise (Minutes/Week): 150, Intensity: Moderate, Exercise limited by: None identified  Goals Addressed            This Visit's Progress   . Patient Stated       My goal is to lose 10 pounds.      Depression Screen PHQ 2/9 Scores 09/29/2020 08/18/2020 06/18/2019 06/12/2018 05/25/2017 05/30/2014 09/20/2013  PHQ - 2 Score 0 0 0 0 0 0 0  PHQ- 9 Score 0 0 - - - - -    Fall Risk Fall Risk  09/29/2020 08/18/2020 06/18/2019 06/12/2018 05/25/2017  Falls in the past year? 0 0 0 0 No  Number falls in past yr: 0 0 0 - -  Injury with Fall? 0 0 - - -  Risk for fall due to : No Fall  Risks No Fall Risks - - -  Follow up Falls evaluation completed Falls evaluation completed - - -    FALL RISK PREVENTION PERTAINING TO THE HOME:  Any stairs in or around the home? Yes  If so, are there any without handrails? No  Home free of loose throw rugs in walkways, pet beds, electrical cords, etc? Yes  Adequate lighting in your home to reduce risk of falls? Yes   ASSISTIVE DEVICES UTILIZED TO PREVENT FALLS:  Life alert? No  Use of a cane, walker or w/c? No  Grab bars in the bathroom? Yes  Shower chair or bench in shower? Yes  Elevated toilet seat or a handicapped toilet? Yes   TIMED UP AND GO:  Was the test performed? No .  Length of time to ambulate 10 feet: 0 sec.   Gait steady and fast without use of assistive device  Cognitive Function: Normal cognitive status assessed by direct observation by this Nurse Health Advisor. No abnormalities found.          Immunizations Immunization History  Administered Date(s) Administered  . Fluad Quad(high Dose 65+) 02/15/2019, 02/28/2020  . Influenza, High Dose Seasonal PF 02/15/2017, 02/13/2018  . Influenza-Unspecified 03/17/2014, 02/16/2015, 02/29/2016  . Moderna Sars-Covid-2 Vaccination 07/13/2019, 08/10/2019, 05/22/2020  . Pneumococcal Conjugate-13 05/30/2014  . Pneumococcal Polysaccharide-23 09/26/2013  . Tdap 02/13/2018    TDAP status: Up to date  Flu Vaccine status: Up to date  Pneumococcal vaccine status: Up to date  Covid-19 vaccine status: Completed vaccines  Qualifies for Shingles Vaccine? Yes   Zostavax completed No   Shingrix Completed?: No.    Education has been provided regarding the importance of this vaccine. Patient has been advised to call insurance company to determine out of pocket expense if they have not yet received this vaccine. Advised may also receive vaccine at local pharmacy or Health Dept. Verbalized acceptance and understanding.  Screening Tests Health Maintenance  Topic Date Due  .  INFLUENZA VACCINE  12/15/2020  . COLON CANCER SCREENING ANNUAL FOBT  04/30/2021  . DEXA SCAN  06/26/2021  . MAMMOGRAM  11/07/2021  . TETANUS/TDAP  02/14/2028  .  COLONOSCOPY (Pts 45-69yrs Insurance coverage will need to be confirmed)  09/10/2030  . COVID-19 Vaccine  Completed  . Hepatitis C Screening  Completed  . PNA vac Low Risk Adult  Completed  . HPV VACCINES  Aged Out    Health Maintenance  There are no preventive care reminders to display for this patient.  Colorectal cancer screening: Type of screening: Colonoscopy. Completed 09/09/2020. Repeat every 10 years  Mammogram status: Completed 11/08/2019. Repeat every year  Bone Density status: Completed 06/27/2019. Results reflect: Bone density results: OSTEOPENIA. Repeat every 2 years.  Lung Cancer Screening: (Low Dose CT Chest recommended if Age 27-80 years, 30 pack-year currently smoking OR have quit w/in 15years.) does not qualify.   Lung Cancer Screening Referral: no  Additional Screening:  Hepatitis C Screening: does qualify; Completed yes  Vision Screening: Recommended annual ophthalmology exams for early detection of glaucoma and other disorders of the eye. Is the patient up to date with their annual eye exam?  Yes  Who is the provider or what is the name of the office in which the patient attends annual eye exams? Julian Reil, MD. If pt is not established with a provider, would they like to be referred to a provider to establish care? No .   Dental Screening: Recommended annual dental exams for proper oral hygiene  Community Resource Referral / Chronic Care Management: CRR required this visit?  No   CCM required this visit?  No      Plan:     I have personally reviewed and noted the following in the patient's chart:   . Medical and social history . Use of alcohol, tobacco or illicit drugs  . Current medications and supplements including opioid prescriptions.  . Functional ability and status . Nutritional  status . Physical activity . Advanced directives . List of other physicians . Hospitalizations, surgeries, and ER visits in previous 12 months . Vitals . Screenings to include cognitive, depression, and falls . Referrals and appointments  In addition, I have reviewed and discussed with patient certain preventive protocols, quality metrics, and best practice recommendations. A written personalized care plan for preventive services as well as general preventive health recommendations were provided to patient.     Sheral Flow, LPN   1/75/1025   Nurse Notes:  Patient is cogitatively intact. There were no vitals filed for this visit. There is no height or weight on file to calculate BMI. Patient stated that she has no issues with gait or balance; does not use any assistive devices. Medications reviewed with patient; no opioid use noted.

## 2020-10-01 IMAGING — MG DIGITAL SCREENING BILAT W/ TOMO W/ CAD
6 of 12 series · 6 of 36 positions shown · non-contrast
Comparison: Previous exam(s).

CLINICAL DATA: Screening.

EXAM:
DIGITAL SCREENING BILATERAL MAMMOGRAM WITH TOMO AND CAD

[R MLO synth-2D]
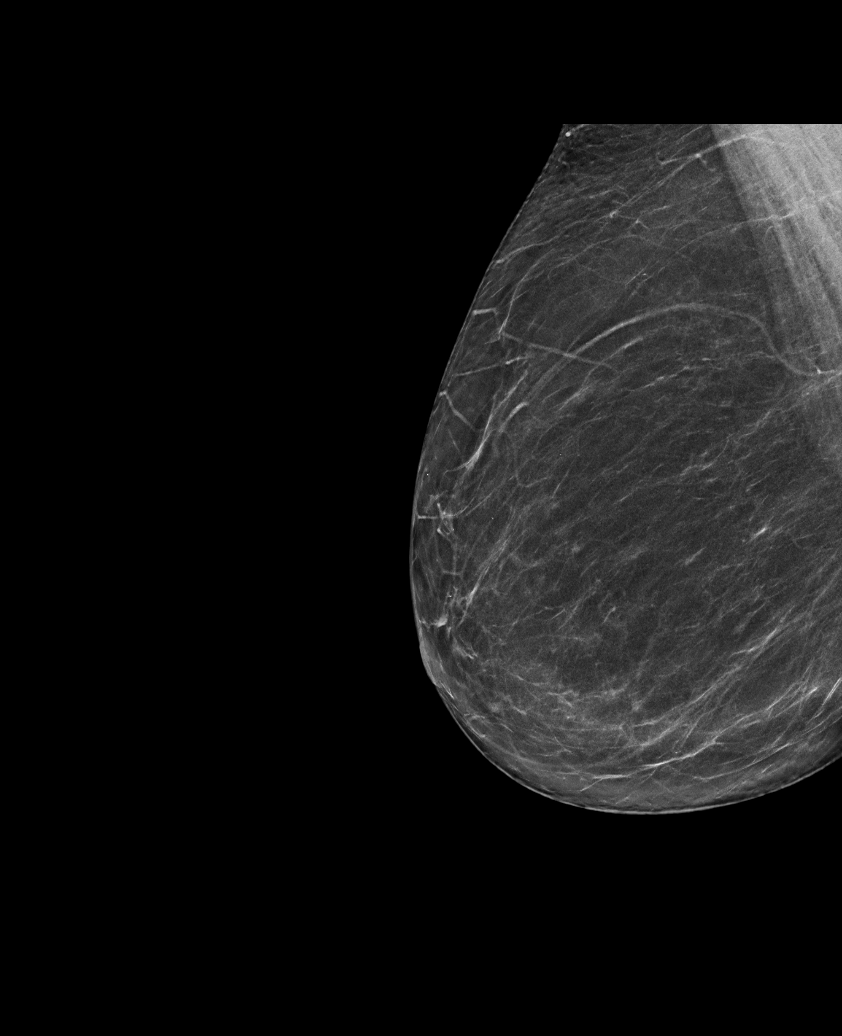

[L CC synth-2D (1 of 2)]
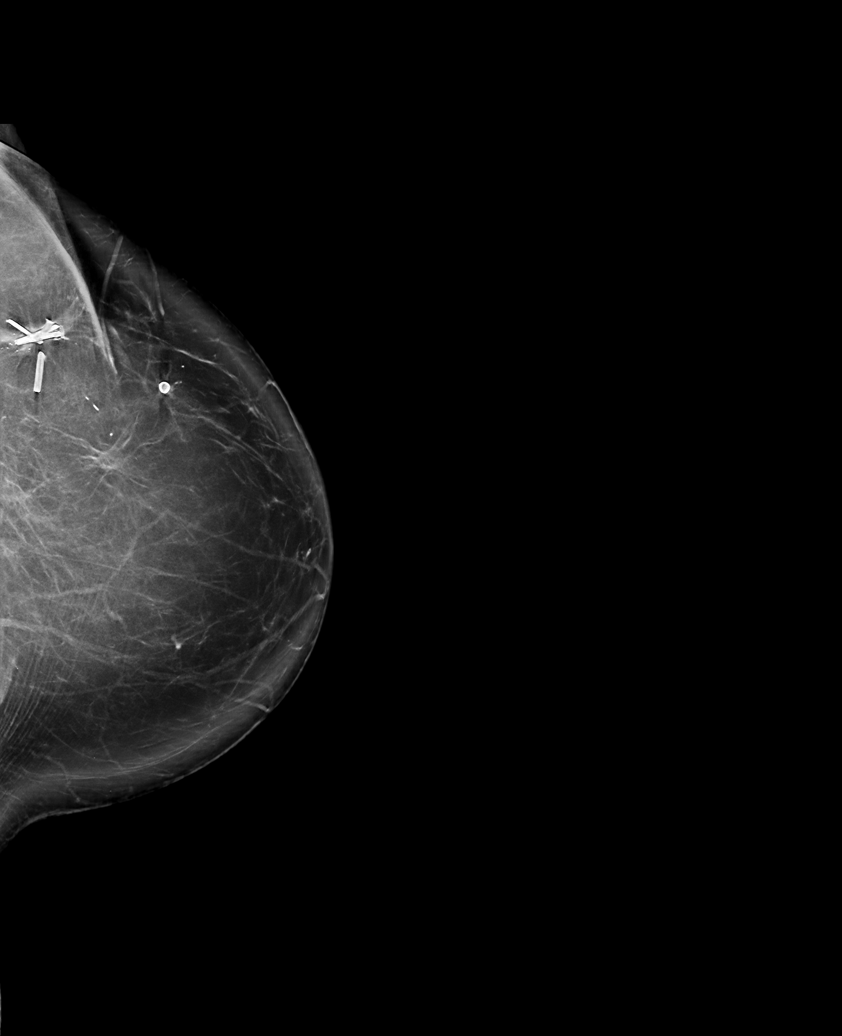

[L MLO synth-2D (1 of 2)]
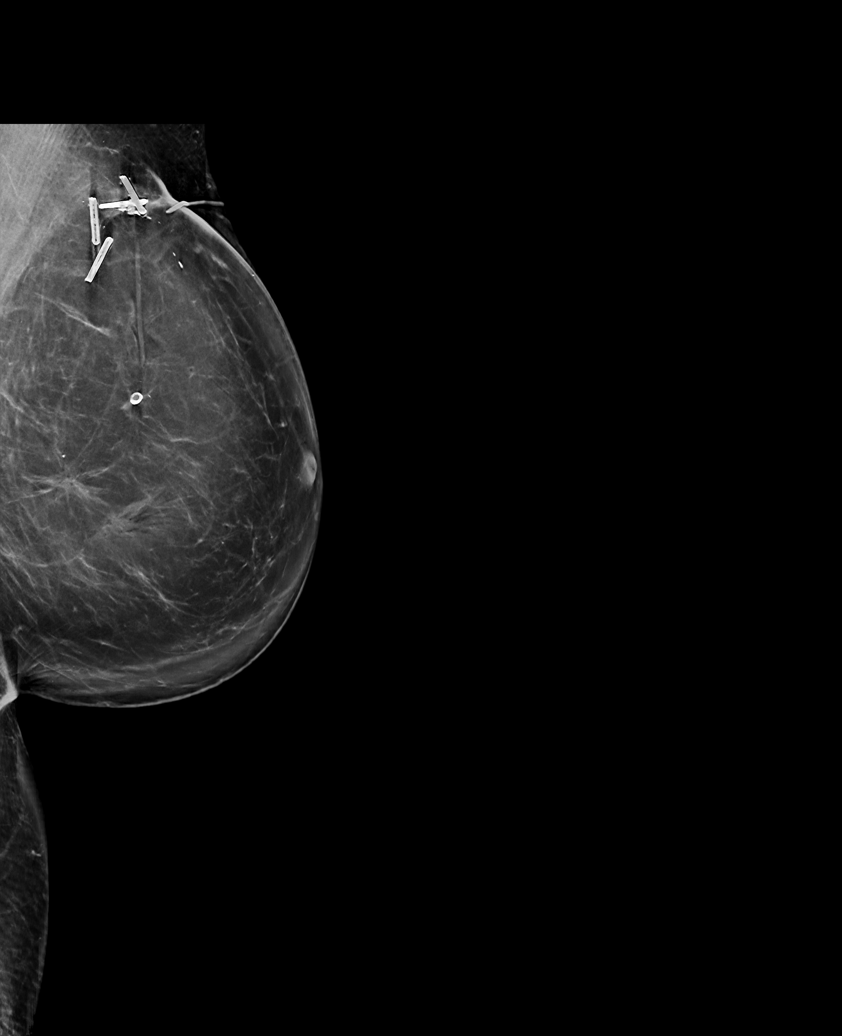

[L MLO synth-2D (2 of 2)]
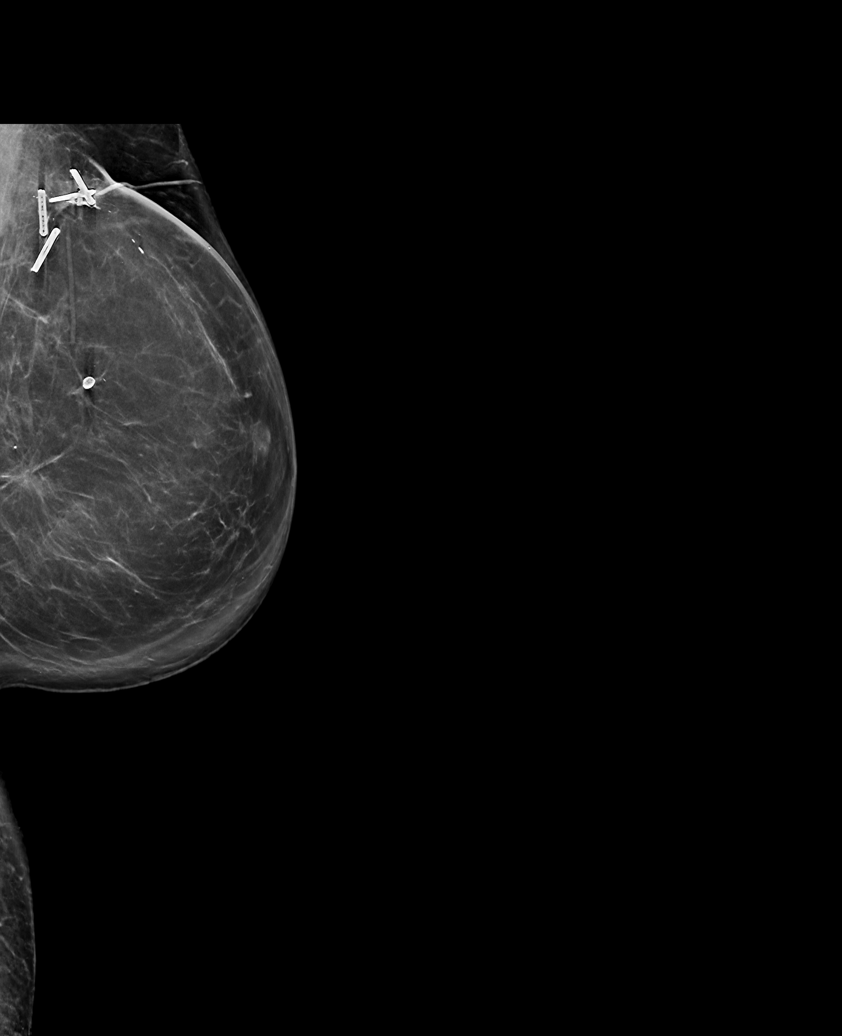

[L CC synth-2D (2 of 2)]
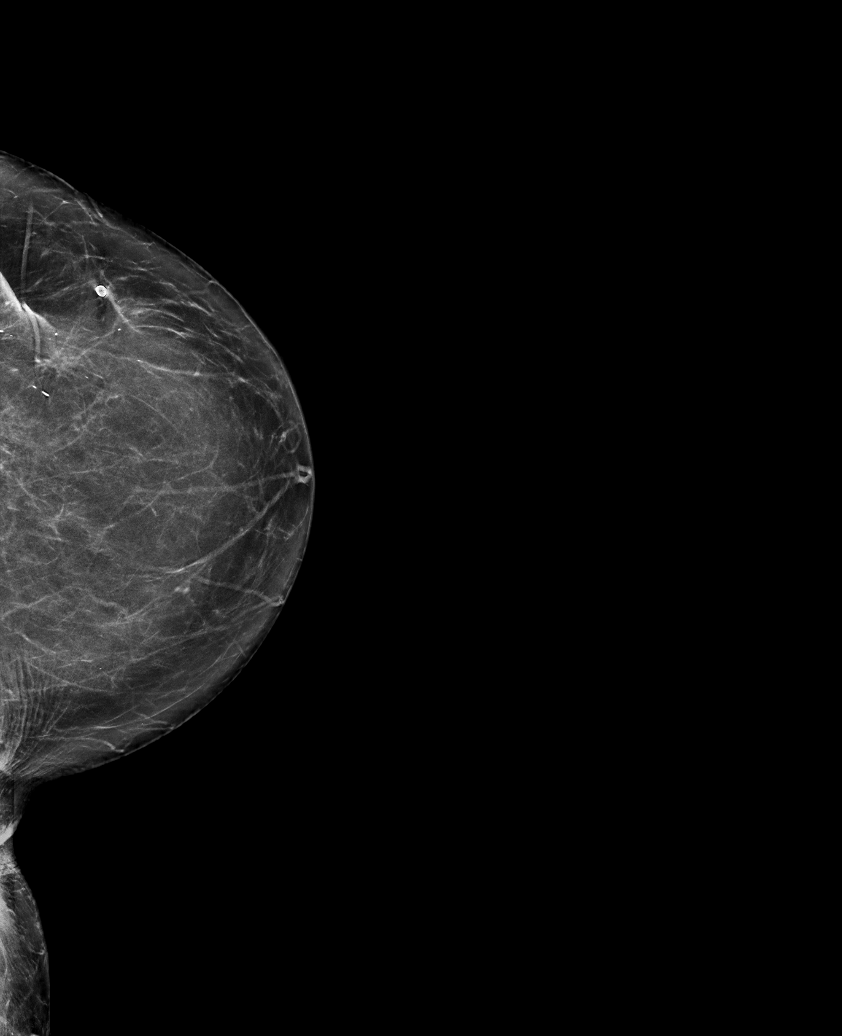

[R CC synth-2D]
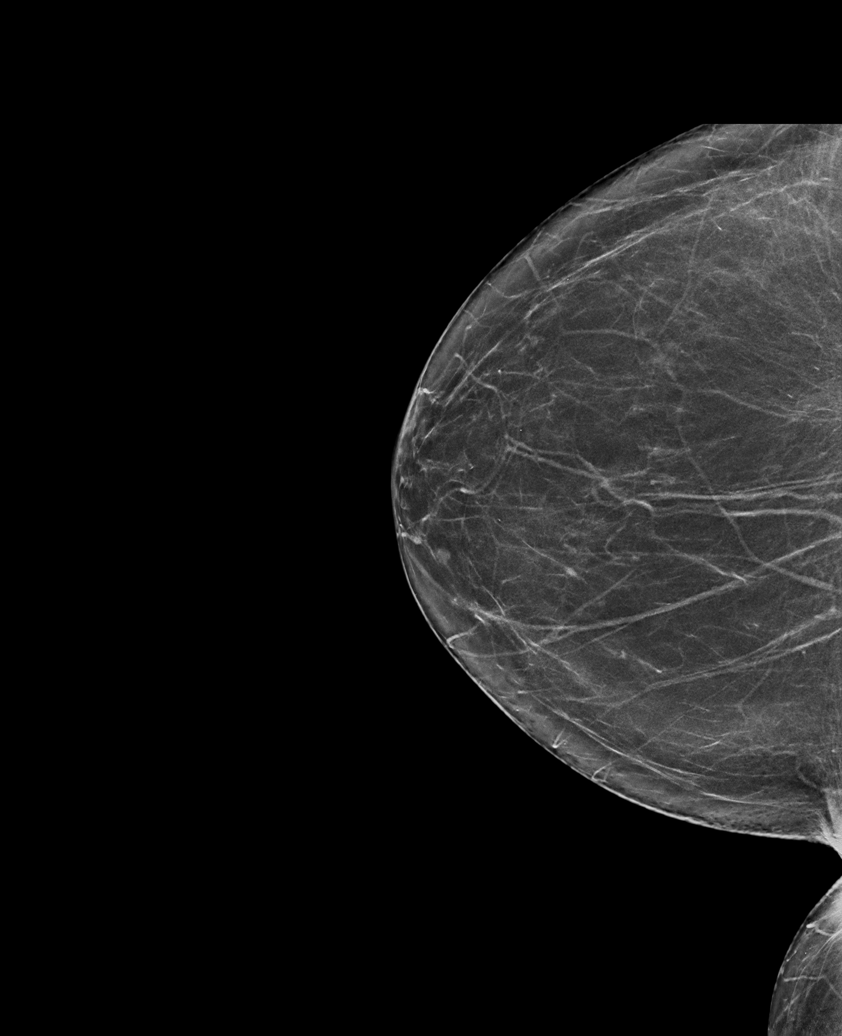

[6 of 36 positions shown; findings below may reference images not displayed]

ACR Breast Density Category b: There are scattered areas of
fibroglandular density.
FINDINGS: There are no findings suspicious for malignancy. Images were
processed with CAD.
IMPRESSION: No mammographic evidence of malignancy. A result letter of this
screening mammogram will be mailed directly to the patient.

RECOMMENDATION:
Screening mammogram in one year. (Code:[id number])

BI-RADS CATEGORY  1: Negative.

## 2020-10-14 ENCOUNTER — Encounter: Payer: Self-pay | Admitting: Internal Medicine

## 2020-10-14 ENCOUNTER — Other Ambulatory Visit: Payer: Self-pay

## 2020-10-14 DIAGNOSIS — R600 Localized edema: Secondary | ICD-10-CM

## 2020-10-14 MED ORDER — HYDROCHLOROTHIAZIDE 25 MG PO TABS: 25.0000 mg | ORAL_TABLET | Freq: Every day | ORAL | 1 refills | Status: DC

## 2020-10-14 NOTE — Telephone Encounter (Signed)
[facility] pharmacy is requesting a refill on hydrocholorthiazide. Pt does not have an appt set up. Please advise who the pt is supposed to see and refill this medication for the pt. Thanks

## 2020-10-14 NOTE — Telephone Encounter (Signed)
Yes please refill.  She is recalled to see Dr. Johney Frame for October 2022.

## 2020-10-16 NOTE — Progress Notes (Addendum)
Virtual Visit via telephone note  I connected with Sarah [place]ricks on 10/16/20 at  8:50 AM EDT by telephone and verified that I am speaking with the correct person using two identifiers.   I discussed the limitations of evaluation and management by telemedicine and the availability of in person appointments. The patient expressed understanding and agreed to proceed.  Present for the visit:  Myself, Dr Billey Gosling, Sarah [place].  The patient is currently at home and I am in the office.    No referring provider.    History of Present Illness: She is here for an acute visit for cold symptoms.   Her symptoms started 3 weeks ago.     She is experiencing PND, ST, throat is red, cough that is dry, some sinus pressure around the bones of her face, mild right ear pressure, tearing of right eye, runny nose with clear mucus and headaches   No fever.  No shortness of breath.  Her husband is not sick.  She has tried taking sinus medicine.     She was also concerned about her blood pressure, which has been variable.  She has lost some weight.  She denies any other changes.  She is taking her medication daily as prescribed.  Today is the lowest its been it was 101 over 18s.  She is not lightheaded.  Typically it is higher.  Review of Systems  Constitutional: Negative for fever.  HENT: Positive for congestion (mild), ear pain (right), sinus pain (around bones in face) and sore throat.        Runny nose - clear, PND  Respiratory: Positive for cough (dry). Negative for shortness of breath and wheezing.   Cardiovascular: Negative for chest pain.  Gastrointestinal: Negative for abdominal pain, diarrhea and nausea.  Musculoskeletal: Negative for myalgias.  Neurological: Positive for headaches. Negative for dizziness.      Social History   Socioeconomic History  . Marital status: Married    Spouse name: Not on file  . Number of children: Not on file  . Years of education: Not on file  .  Highest education level: Not on file  Occupational History  . Not on file  Tobacco Use  . Smoking status: Never Smoker  . Smokeless tobacco: Never Used  Vaping Use  . Vaping Use: Never used  Substance and Sexual Activity  . Alcohol use: No  . Drug use: No  . Sexual activity: Not on file  Other Topics Concern  . Not on file  Social History Narrative   Married, lives with spouse   Retired from [employer] irregularly, rides an exercise bike   Social Determinants of Health   Financial Resource Strain: [place]   . Difficulty of Paying Living Expenses: Not hard at all  Food Insecurity: No Food Insecurity  . Worried About Charity fundraiser in the Last Year: Never true  . Ran Out of Food in the Last Year: Never true  Transportation Needs: No Transportation Needs  . Lack of Transportation (Medical): No  . Lack of Transportation (Non-Medical): No  Physical Activity: Sufficiently Active  . Days of Exercise per Week: 5 days  . Minutes of Exercise per Session: 30 min  Stress: No Stress Concern Present  . Feeling of Stress : Not at all  Social Connections: Socially Integrated  . Frequency of Communication with Friends and Family: More than three times a week  . Frequency of Social Gatherings with Friends and  Family: Twice a week  . Attends Religious Services: More than 4 times per year  . Active Member of Clubs or Organizations: No  . Attends Archivist Meetings: More than 4 times per year  . Marital Status: Married     Observations/Objective: Appears well in NAD   Assessment and Plan:  See Problem List for Assessment and Plan of chronic medical problems.   Follow Up Instructions:    I discussed the assessment and treatment plan with the patient. The patient was provided an opportunity to ask questions and all were answered. The patient agreed with the plan and demonstrated an understanding of the instructions.   The patient was advised to call  back or seek an in-person evaluation if the symptoms worsen or if the condition fails to improve as anticipated.  Time spent on telephone call: 14 minutes.  Binnie Rail, MD

## 2020-10-17 ENCOUNTER — Encounter: Payer: Self-pay | Admitting: Internal Medicine

## 2020-10-17 ENCOUNTER — Telehealth (INDEPENDENT_AMBULATORY_CARE_PROVIDER_SITE_OTHER): Payer: Medicare Other | Admitting: Internal Medicine

## 2020-10-17 DIAGNOSIS — I10 Essential (primary) hypertension: Secondary | ICD-10-CM | POA: Diagnosis not present

## 2020-10-17 DIAGNOSIS — J019 Acute sinusitis, unspecified: Secondary | ICD-10-CM | POA: Diagnosis not present

## 2020-10-17 MED ORDER — AMOXICILLIN-POT CLAVULANATE 875-125 MG PO TABS: 1.0000 | ORAL_TABLET | Freq: Two times a day (BID) | ORAL | 0 refills | Status: DC

## 2020-10-17 NOTE — Assessment & Plan Note (Signed)
Chronic Blood pressure has been variable at home-lowest today, which is definitely lower than it needs to be, but she is asymptomatic Advised her to continue to monitor on a daily basis and send me a note of her blood pressure continues to get this low so that we can back off of some of her medication She has lost weight, which is likely the cause of the lower blood pressure readings, but she is also sick which could be contributing  Continue carvedilol 12.5 mg twice daily, hydrochlorothiazide 25 mg daily and losartan 100 mg daily

## 2020-10-17 NOTE — Assessment & Plan Note (Signed)
Acute Likely bacterial  Start Augmentin 875-125 mg BID x 10 day otc cold medications Rest, fluid Call if no improvement  

## 2020-10-20 ENCOUNTER — Encounter: Payer: Self-pay | Admitting: Internal Medicine

## 2020-10-27 DIAGNOSIS — H04123 Dry eye syndrome of bilateral lacrimal glands: Secondary | ICD-10-CM | POA: Diagnosis not present

## 2020-10-27 DIAGNOSIS — H25813 Combined forms of age-related cataract, bilateral: Secondary | ICD-10-CM | POA: Diagnosis not present

## 2020-11-02 NOTE — Addendum Note (Signed)
Addended by: Binnie Rail on: 11/02/2020 01:35 PM   Modules accepted: Orders

## 2020-11-05 MED ORDER — DOXYCYCLINE HYCLATE 100 MG PO TABS: 100.0000 mg | ORAL_TABLET | Freq: Two times a day (BID) | ORAL | 0 refills | Status: DC

## 2020-11-05 NOTE — Addendum Note (Signed)
Addended by: Binnie Rail on: 11/05/2020 08:49 PM   Modules accepted: Orders

## 2020-11-13 ENCOUNTER — Telehealth: Payer: Self-pay | Admitting: Internal Medicine

## 2020-11-13 NOTE — Chronic Care Management (AMB) (Signed)
  Chronic Care Management   Note  11/13/2020 Name: Sarah [place] MRN: [id number] DOB: 1945-08-23  Sarah [place] is a 75 y.o. year old female who is a primary care patient of Burns, Claudina Lick, MD. I reached out to [name] by phone today in response to a referral sent by Ms. Nancy Nordmann PCP, Binnie Rail, MD.   [name] was given information about Chronic Care Management services today including:  CCM service includes personalized support from designated clinical staff supervised by her physician, including individualized plan of care and coordination with other care providers 24/7 contact phone numbers for assistance for urgent and routine care needs. Service will only be billed when office clinical staff spend 20 minutes or more in a month to coordinate care. Only one practitioner may furnish and bill the service in a calendar month. The patient may stop CCM services at any time (effective at the end of the month) by phone call to the office staff.   Patient agreed to services and verbal consent obtained.   Follow up plan:   [name]

## 2020-11-20 ENCOUNTER — Other Ambulatory Visit: Payer: Self-pay

## 2020-11-20 ENCOUNTER — Ambulatory Visit
Admission: RE | Admit: 2020-11-20 | Discharge: 2020-11-20 | Disposition: A | Discharge status: Home / Self Care | Payer: Medicare Other | Source: Ambulatory Visit | Attending: Internal Medicine | Admitting: Internal Medicine

## 2020-11-20 DIAGNOSIS — Z1231 Encounter for screening mammogram for malignant neoplasm of breast: Secondary | ICD-10-CM | POA: Diagnosis not present

## 2020-11-25 DIAGNOSIS — H2512 Age-related nuclear cataract, left eye: Secondary | ICD-10-CM | POA: Diagnosis not present

## 2020-11-25 DIAGNOSIS — H2511 Age-related nuclear cataract, right eye: Secondary | ICD-10-CM | POA: Diagnosis not present

## 2020-12-12 ENCOUNTER — Telehealth: Payer: Self-pay

## 2020-12-12 DIAGNOSIS — H2512 Age-related nuclear cataract, left eye: Secondary | ICD-10-CM | POA: Diagnosis not present

## 2020-12-12 DIAGNOSIS — H25812 Combined forms of age-related cataract, left eye: Secondary | ICD-10-CM | POA: Diagnosis not present

## 2020-12-12 NOTE — Chronic Care Management (AMB) (Signed)
    Chronic Care Management Pharmacy Assistant   Name: Sarah [place]  MRN: [id number] DOB: [birth year]  Sarah [place]ricks is an 75 y.o. year old female who presents for her initial CCM visit with the clinical pharmacist.  Reason for Encounter: Initial CCM Visit   Recent office visits:  10/17/20: Sarah Gosling MD(PCP)(Video)- Patient was seen for Acute non recurrent sinusitis. Patient was started on Amoxicillin 975-125 MG BID. Follow up as needed.  08/18/20: Sarah Gosling MD- Patient was seen for Annual Wellness exam. Labs were ordered and Referral to Gastroenterology was placed. Follow up in 1 year.  Recent consult visits:  09/09/20 Sarah Park MD(Gastro)- Patient was seen for a colonoscopy.  Hospital visits:  None in previous 6 months  Medications: Outpatient Encounter Medications as of 12/12/2020  Medication Sig   aspirin 81 MG tablet Take 81 mg by mouth daily.   calcium carbonate (OS-CAL) 600 MG TABS tablet Take 600 mg by mouth 2 (two) times daily with a meal.    carvedilol (COREG) 12.5 MG tablet TAKE 1 TABLET BY MOUTH 2 TIMES DAILY WITH AMEAL   Cholecalciferol (EQL VITAMIN D3) 1000 units tablet Take 1 tablet (1,000 Units total) by mouth daily.   doxycycline (VIBRA-TABS) 100 MG tablet Take 1 tablet (100 mg total) by mouth 2 (two) times daily.   escitalopram (LEXAPRO) 20 MG tablet TAKE 1 TABLET(20 MG) BY MOUTH DAILY   losartan (COZAAR) 100 MG tablet TAKE 1 TABLET(100 MG) BY MOUTH DAILY   Omega-3 Fatty Acids (FISH OIL) 1200 MG CAPS Take by mouth daily.   rosuvastatin (CRESTOR) 10 MG tablet TAKE 1 TABLET BY MOUTH EVERY DAY   No facility-administered encounter medications on file as of 12/12/2020.   Current Medication List aspirin 81 MG  calcium carbonate 600 MG  carvedilol 12.5 MG last filled 11/16/20 90 DS Cholecalciferol (VITAMIN D3) 1000 units  doxycycline 100 MG last filled 11/05/20 10 DS escitalopram 20 MG last filled 11/14/20 90 DS losartan 100 MG last filled 10/13/20 90  DS Omega-3 Fatty Acids 1200 MG rosuvastatin (CRESTOR) 10 MG last filled 11/14/20 90 DS  Star Rating Drugs: rosuvastatin (CRESTOR) 10 MG last filled 11/14/20 90 DS losartan 100 MG last filled 10/13/20 90 DS  [place] Pharmacist Assistant [phone]

## 2020-12-22 NOTE — Progress Notes (Signed)
Chronic Care Management Pharmacy Note  12/23/2020 Name:  Sarah Hendricks MRN:  [id number] DOB:  11-Dec-1945  Summary: - Patient reports that she is feeling much better since completion of most recent antibiotic course - Has been checking blood pressures, has been in range, averaging 140/70's - HR averaging 59-61, denies any symptoms of hypotension/ bradycardia -Last LDL level was in range, patient denies any issues or concerns with current medications  - Patient reports that depression is well controlled at this time, possible in future she would like to try to stop escitalopram but at this time would like to continue  Recommendations/Changes made from today's visit: - Patient to continue current medications as BP, LDL, and depression are all well controlled, will plan to recheck DEXA in 2023 to determine if further action is needed (patient has trialed alendronate in the past, declined trial of additional bisphosphonate at this time), prefers to no start any additional medications at this time   Subjective: Sarah Hendricks is an 75 y.o. year old female who is a primary patient of Burns, Claudina Lick, MD.  The CCM team was consulted for assistance with disease management and care coordination needs.    Engaged with patient by telephone for initial visit in response to provider referral for pharmacy case management and/or care coordination services.   Consent to Services:  The patient was given the following information about Chronic Care Management services today, agreed to services, and gave verbal consent: 1. CCM service includes personalized support from designated clinical staff supervised by the primary care provider, including individualized plan of care and coordination with other care providers 2. 24/7 contact phone numbers for assistance for urgent and routine care needs. 3. Service will only be billed when office clinical staff spend 20 minutes or more in a month to coordinate care. 4.  Only one practitioner may furnish and bill the service in a calendar month. 5.The patient may stop CCM services at any time (effective at the end of the month) by phone call to the office staff. 6. The patient will be responsible for cost sharing (co-pay) of up to 20% of the service fee (after annual deductible is met). Patient agreed to services and consent obtained.  Patient Care Team: Binnie Rail, MD as PCP - General (Internal Medicine) Dorothy Spark, MD (Inactive) as PCP - Cardiology (Cardiology) Alanda Slim Neena Rhymes, MD as Consulting Physician (Ophthalmology) Delice Bison Darnelle Maffucci, [facility] as Pharmacist (Pharmacist)  Recent office visits: 10/17/20: Billey Gosling MD(PCP)(Video)- Patient was seen for Acute non recurrent sinusitis. Patient was started on Amoxicillin 875-125 MG BID. Follow up as needed. Continue current blood pressure medications / later had to be switched to doxyclycline    08/18/20: Billey Gosling MD- Patient was seen for Annual Wellness exam. Labs were ordered and Referral to Gastroenterology was placed. Follow up in 1 year.   Recent consult visits:  09/09/20 Thornton Park MD(Gastro)- Patient was seen for a colonoscopy.   Hospital visits:  None in previous 6 months  Objective:  Lab Results  Component Value Date   CREATININE 0.84 08/18/2020   BUN 14 08/18/2020   GFR 68.90 08/18/2020   GFRNONAA 71 07/26/2019   GFRAA 81 07/26/2019   NA 128 (L) 08/18/2020   K 3.7 08/18/2020   CALCIUM 9.4 08/18/2020   CO2 29 08/18/2020   GLUCOSE 102 (H) 08/18/2020    Lab Results  Component Value Date/Time   HGBA1C 5.8 08/18/2020 08:44 AM   HGBA1C 5.8 06/18/2019 09:44 AM  GFR 68.90 08/18/2020 08:44 AM   GFR 74.74 06/18/2019 09:44 AM    Last diabetic Eye exam:  No results found for: HMDIABEYEEXA  Last diabetic Foot exam:  No results found for: HMDIABFOOTEX   Lab Results  Component Value Date   CHOL 125 08/18/2020   HDL 48.30 08/18/2020   LDLCALC 56 08/18/2020   LDLDIRECT  87.0 06/12/2018   TRIG 103.0 08/18/2020   CHOLHDL 3 08/18/2020    Hepatic Function Latest Ref Rng & Units 08/18/2020 06/18/2019 06/12/2018  Total Protein 6.0 - 8.3 g/dL 7.0 7.1 6.8  Albumin 3.5 - 5.2 g/dL 4.3 4.2 4.3  AST 0 - 37 U/L $Remo'17 18 14  'uRgvC$ ALT 0 - 35 U/L $Remo'13 16 13  'CSegg$ Alk Phosphatase 39 - 117 U/L 65 73 86  Total Bilirubin 0.2 - 1.2 mg/dL 0.7 0.6 0.7  Bilirubin, Direct 0.0 - 0.3 mg/dL - - -    Lab Results  Component Value Date/Time   TSH 1.52 06/18/2019 09:44 AM   TSH 1.45 06/12/2018 09:46 AM    CBC Latest Ref Rng & Units 08/18/2020 06/18/2019 06/12/2018  WBC 4.0 - 10.5 K/uL 5.8 7.2 7.2  Hemoglobin 12.0 - 15.0 g/dL 13.5 14.0 14.9  Hematocrit 36.0 - 46.0 % 38.3 41.0 43.2  Platelets 150.0 - 400.0 K/uL 256.0 259.0 266.0    Lab Results  Component Value Date/Time   VD25OH 60.11 06/18/2019 09:44 AM   VD25OH 39 09/19/2013 10:37 AM    Clinical ASCVD: Yes  The ASCVD Risk score (El Castillo., et al., 2013) failed to calculate for the following reasons:   The valid total cholesterol range is 130 to 320 mg/dL    Depression screen [facility] 2/9 12/23/2020 09/29/2020 08/18/2020  Decreased Interest 0 0 0  Down, Depressed, Hopeless 0 0 0  PHQ - 2 Score 0 0 0  Altered sleeping 0 0 0  Tired, decreased energy 0 0 0  Change in appetite 0 0 0  Feeling bad or failure about yourself  0 0 0  Trouble concentrating 0 0 0  Moving slowly or fidgety/restless 0 0 0  Suicidal thoughts 0 0 0  PHQ-9 Score 0 0 0    Social History   Tobacco Use  Smoking Status Never  Smokeless Tobacco Never   BP Readings from Last 3 Encounters:  09/09/20 (!) 142/57  08/18/20 136/70  04/28/20 (!) 162/86   Pulse Readings from Last 3 Encounters:  09/09/20 64  08/18/20 66  04/28/20 67   Wt Readings from Last 3 Encounters:  09/09/20 150 lb (68 kg)  08/27/20 150 lb (68 kg)  08/18/20 151 lb (68.5 kg)   BMI Readings from Last 3 Encounters:  09/09/20 26.57 kg/m  08/27/20 26.57 kg/m  08/18/20 26.75 kg/m     Assessment/Interventions: Review of patient past medical history, allergies, medications, health status, including review of consultants reports, laboratory and other test data, was performed as part of comprehensive evaluation and provision of chronic care management services.   SDOH:  (Social Determinants of Health) assessments and interventions performed: Yes  SDOH Screenings   Alcohol Screen: Low Risk    Last Alcohol Screening Score (AUDIT): 0  Depression (PHQ2-9): Low Risk    PHQ-2 Score: 0  Financial Resource Strain: Low Risk    Difficulty of Paying Living Expenses: Not hard at all  Food Insecurity: No Food Insecurity   Worried About Charity fundraiser in the Last Year: Never true   Ran Out of Food in the Last  Year: Never true  Housing: Low Risk    Last Housing Risk Score: 0  Physical Activity: Sufficiently Active   Days of Exercise per Week: 5 days   Minutes of Exercise per Session: 30 min  Social Connections: Socially Integrated   Frequency of Communication with Friends and Family: More than three times a week   Frequency of Social Gatherings with Friends and Family: Twice a week   Attends Religious Services: More than 4 times per year   Active Member of [employer] or Organizations: No   Attends Music therapist: More than 4 times per year   Marital Status: Married  Stress: No Stress Concern Present   Feeling of Stress : Not at all  Tobacco Use: Low Risk    Smoking Tobacco Use: Never   Smokeless Tobacco Use: Never  Transportation Needs: No Transportation Needs   Lack of Transportation (Medical): No   Lack of Transportation (Non-Medical): No    CCM Care Plan  Allergies  Allergen Reactions   Acetaminophen-Codeine Other (See Comments)    Bad dreams   Tigan [Trimethobenzamide] Other (See Comments)    Bad dreams   Alendronate     Caused leg pains    Atorvastatin Other (See Comments)    Pt reports "causes bilateral leg pains."   Codeine     Bad  dreams    Medications Reviewed Today     Reviewed by Tomasa Blase, [employer] (Pharmacist) on 12/23/20 at [phone]  Med List Status: <None>   Medication Order Taking? Sig Documenting Provider Last Dose Status Informant  aspirin 81 MG tablet [id number] Yes Take 81 mg by mouth daily. [provider] Taking Active   calcium carbonate (OS-CAL) 600 MG TABS tablet [id number] Yes Take 600 mg by mouth 2 (two) times daily with a meal.  [provider] Taking Active   carvedilol (COREG) 12.5 MG tablet [id number] Yes TAKE 1 TABLET BY MOUTH 2 TIMES DAILY WITH AMEAL Dorothy Spark, MD Taking Active   Cholecalciferol (EQL VITAMIN D3) 1000 units tablet [id number] Yes Take 1 tablet (1,000 Units total) by mouth daily.  Patient taking differently: Take 1,000 Units by mouth in the morning and at bedtime.   Binnie Rail, MD Taking Active   escitalopram (LEXAPRO) 20 MG tablet [id number] Yes TAKE 1 TABLET(20 MG) BY MOUTH DAILY Burns, Claudina Lick, MD Taking Active   losartan (COZAAR) 100 MG tablet [id number] Yes TAKE 1 TABLET(100 MG) BY MOUTH DAILY Dorothy Spark, MD Taking Active   Omega-3 Fatty Acids (FISH OIL) 1200 MG CAPS [id number] Yes Take by mouth daily. [provider] Taking Active   rosuvastatin (CRESTOR) 10 MG tablet [id number] Yes TAKE 1 TABLET BY MOUTH EVERY DAY Dorothy Spark, MD Taking Active             Patient Active Problem List   Diagnosis Date Noted   Acute sinus infection 10/17/2020   Stress and adjustment reaction 01/04/2020   Prediabetes 05/26/2017   Osteopenia w/ high frax 05/30/2014   CAD (coronary artery disease)    Hypertension    Hyperlipidemia    History of breast cancer    Vitamin D deficiency     Immunization History  Administered Date(s) Administered   Fluad Quad(high Dose 65+) 02/15/2019, 02/28/2020   Influenza, High Dose Seasonal PF 02/15/2017, 02/13/2018   Influenza-Unspecified 03/17/2014, 02/16/2015, 02/29/2016   Moderna Sars-Covid-2  Vaccination 07/13/2019, 08/10/2019, 05/22/2020   Pneumococcal Conjugate-13 05/30/2014   Pneumococcal Polysaccharide-23 09/26/2013   Tdap 02/13/2018  Conditions to be addressed/monitored:  Hypertension, Hyperlipidemia, Depression, and Osteopenia  Care Plan : CCM Care Plan  Updates made by Tomasa Blase, RPH since 12/23/2020 12:00 AM     Problem: HTN, HLD, Depression, Osteopenia   Priority: High  Onset Date: 12/23/2020     Long-Range Goal: Disease Management   Start Date: 12/23/2020  Expected End Date: 06/25/2021  This Visit's Progress: On track  Priority: High  Note:   Current Barriers:  Unable to independently monitor therapeutic efficacy  Pharmacist Clinical Goal(s):  Patient will achieve adherence to monitoring guidelines and medication adherence to achieve therapeutic efficacy maintain control of blood pressure, LDL, and depression as evidenced by BP logs, next lipid panel, and mood  through collaboration with PharmD and provider.   Interventions: 1:1 collaboration with Binnie Rail, MD regarding development and update of comprehensive plan of care as evidenced by provider attestation and co-signature Inter-disciplinary care team collaboration (see longitudinal plan of care) Comprehensive medication review performed; medication list updated in electronic medical record  Hypertension (BP goal <140/90) -Controlled -Current treatment: Losartan 100mg  - 1 tablet daily  Carvedilol 12.5mg  - 1 tablet twice daily  -Medications previously tried: torsemide, spironolactone, metoprolol tartrate,   -Current home readings: reports that blood pressures have been averaging 140/70s at times HR ~59-61 -Current dietary habits: reports to eating a sodium reduce diet -Current exercise habits: tries to walk when she is able  -Denies hypotensive/hypertensive symptoms -Educated on BP goals and benefits of medications for prevention of heart attack, stroke and kidney damage; Daily salt intake  goal < 2300 mg; Exercise goal of 150 minutes per week; Importance of home blood pressure monitoring; Proper BP monitoring technique; Symptoms of hypotension and importance of maintaining adequate hydration; -Counseled to monitor BP at home daily, document, and provide log at future appointments -Counseled on diet and exercise extensively Recommended to continue current medication  Hyperlipidemia / CAD /Previous MI: (LDL goal < 70) -Controlled Lab Results  Component Value Date   LDLCALC 56 08/18/2020  -Current treatment: Rosuvastatin 10mg  - 1 tablet daily  Aspirin 81mg  - 1 tablet daily  Fish Oils 1200mg  - 1 capsule daily  -Medications previously tried: atorvastatin   -Current dietary patterns: reports to diet that is minimal in foods high in cholesterol -Current exercise habits: walks when she is able to  -Educated on Cholesterol goals;  Benefits of statin for ASCVD risk reduction; Importance of limiting foods high in cholesterol; Exercise goal of 150 minutes per week; -Counseled on diet and exercise extensively Recommended to continue current medication  Depression (Goal: Promotion of posivie mood) -Controlled -Current treatment: Escitalopram 20mg  - 1 tablet daily  -Medications previously tried/failed: n/a -PHQ9: 0 -Educated on Benefits of medication for symptom control Benefits of cognitive-behavioral therapy with or without medication -Recommended to continue current medication  Osteopenia (Goal: Prevention of fractures / disease progression) -Controlled -Last DEXA Scan: 06/27/2019   T-Score right femoral neck: -2.3  T-Score left femoral neck: -2.1  T-Score lumbar spine: -1.2  10-year probability of major osteoporotic fracture: 14%  10-year probability of hip fracture: 3.5% -Patient is a candidate for pharmacologic treatment due to T-Score -1.0 to -2.5 and 10-year risk of hip fracture > 3% -Current treatment  Calcium Carbonate 600mg  - 1 tablet twice daily  Vitamin D3  1000 units - 1 tablet daily  -Medications previously tried: alendronate - unable to tolerate - caused leg pains  -Recommend [phone] units of vitamin D daily. Recommend 1200 mg of calcium daily from dietary and  supplemental sources. Has trialed fosamax in the past, unable to tolerate, will recheck DEXA in 2023 -Counseled on diet and exercise extensively Recommended to continue current medication   Patient Goals/Self-Care Activities Patient will:  - take medications as prescribed check blood pressure daily as she already has been doing, document, and provide at future appointments  Follow Up Plan: Telephone follow up appointment with care management team member scheduled for: The patient has been provided with contact information for the care management team and has been advised to call with any health related questions or concerns.        Medication Assistance: None required.  Patient affirms current coverage meets needs.  Patient's preferred pharmacy is:  [id number] [address], [place] - [address] [place] [address] Phone: [phone] Fax: [phone]   Uses pill box? Yes Pt endorses 100% compliance  Care Plan and Follow Up Patient Decision:  Patient agrees to Care Plan and Follow-up.  Plan: Telephone follow up appointment with care management team member scheduled for:  3 months and The patient has been provided with contact information for the care management team and has been advised to call with any health related questions or concerns.   Tomasa Blase, PharmD Clinical Pharmacist, [place]

## 2020-12-23 ENCOUNTER — Ambulatory Visit (INDEPENDENT_AMBULATORY_CARE_PROVIDER_SITE_OTHER): Payer: Medicare Other

## 2020-12-23 ENCOUNTER — Other Ambulatory Visit: Payer: Self-pay

## 2020-12-23 DIAGNOSIS — E7849 Other hyperlipidemia: Secondary | ICD-10-CM | POA: Diagnosis not present

## 2020-12-23 DIAGNOSIS — M85861 Other specified disorders of bone density and structure, right lower leg: Secondary | ICD-10-CM

## 2020-12-23 DIAGNOSIS — M85862 Other specified disorders of bone density and structure, left lower leg: Secondary | ICD-10-CM

## 2020-12-23 DIAGNOSIS — I10 Essential (primary) hypertension: Secondary | ICD-10-CM

## 2020-12-23 DIAGNOSIS — I2510 Atherosclerotic heart disease of native coronary artery without angina pectoris: Secondary | ICD-10-CM

## 2020-12-23 NOTE — Patient Instructions (Signed)
Visit Information   PATIENT GOALS:   Goals Addressed             This Visit's Progress    Prevent Falls and Broken Bones-Osteopenia       Timeframe:  Long-Range Goal Priority:  Medium Start Date:   12/23/2020                          Expected End Date:   06/25/2021                    Follow Up Date 03/25/2021   - always use handrails on the stairs - always wear shoes or slippers with non-slip sole - get at least 10 minutes of activity every day - keep cell phone with me always - make an emergency alert plan in case I fall - pick up clutter from the floors - remove, or use a non-slip pad, with my throw rugs - wear low heeled or flat shoes with non-skid soles    Why is this important?   When you fall, there are 3 things that control if a bone breaks or not.  These are the fall itself, how hard and the direction that you fall and how fragile your bones are.  Preventing falls is very important for you because of fragile bones.        Track and Manage My Blood Pressure-Hypertension       Timeframe:  Long-Range Goal Priority:  High Start Date:   12/23/2020                          Expected End Date: 06/25/2021                      Follow Up Date 03/25/2021   - check blood pressure daily - choose a place to take my blood pressure (home, clinic or office, retail store) - write blood pressure results in a log or diary    Why is this important?   You won't feel high blood pressure, but it can still hurt your blood vessels.  High blood pressure can cause heart or kidney problems. It can also cause a stroke.  Making lifestyle changes like losing a little weight or eating less salt will help.  Checking your blood pressure at home and at different times of the day can help to control blood pressure.  If the doctor prescribes medicine remember to take it the way the doctor ordered.  Call the office if you cannot afford the medicine or if there are questions about it.          Consent  to CCM Services: Sarah Hendricks was given information about Chronic Care Management services including:  CCM service includes personalized support from designated clinical staff supervised by her physician, including individualized plan of care and coordination with other care providers 24/7 contact phone numbers for assistance for urgent and routine care needs. Service will only be billed when office clinical staff spend 20 minutes or more in a month to coordinate care. Only one practitioner may furnish and bill the service in a calendar month. The patient may stop CCM services at any time (effective at the end of the month) by phone call to the office staff. The patient will be responsible for cost sharing (co-pay) of up to 20% of the service fee (after annual deductible is met).  Patient agreed  to services and verbal consent obtained.   Patient verbalizes understanding of instructions provided today and agrees to view in [place].   Telephone follow up appointment with care management team member scheduled for: 3 months The patient has been provided with contact information for the care management team and has been advised to call with any health related questions or concerns.   Sarah Hendricks, PharmD Clinical Pharmacist, [place]   CLINICAL CARE PLAN: Patient Care Plan: CCM Care Plan     Problem Identified: HTN, HLD, Depression, Osteopenia   Priority: High  Onset Date: 12/23/2020     Long-Range Goal: Disease Management   Start Date: 12/23/2020  Expected End Date: 06/25/2021  This Visit's Progress: On track  Priority: High  Note:   Current Barriers:  Unable to independently monitor therapeutic efficacy  Pharmacist Clinical Goal(s):  Patient will achieve adherence to monitoring guidelines and medication adherence to achieve therapeutic efficacy maintain control of blood pressure, LDL, and depression as evidenced by BP logs, next lipid panel, and mood  through  collaboration with PharmD and provider.   Interventions: 1:1 collaboration with Binnie Rail, MD regarding development and update of comprehensive plan of care as evidenced by provider attestation and co-signature Inter-disciplinary care team collaboration (see longitudinal plan of care) Comprehensive medication review performed; medication list updated in electronic medical record  Hypertension (BP goal <140/90) -Controlled -Current treatment: Losartan 100mg  - 1 tablet daily  Carvedilol 12.5mg  - 1 tablet twice daily  -Medications previously tried: torsemide, spironolactone, metoprolol tartrate,   -Current home readings: reports that blood pressures have been averaging 140/70s at times HR ~59-61 -Current dietary habits: reports to eating a sodium reduce diet -Current exercise habits: tries to walk when she is able  -Denies hypotensive/hypertensive symptoms -Educated on BP goals and benefits of medications for prevention of heart attack, stroke and kidney damage; Daily salt intake goal < 2300 mg; Exercise goal of 150 minutes per week; Importance of home blood pressure monitoring; Proper BP monitoring technique; Symptoms of hypotension and importance of maintaining adequate hydration; -Counseled to monitor BP at home daily, document, and provide log at future appointments -Counseled on diet and exercise extensively Recommended to continue current medication  Hyperlipidemia / CAD /Previous MI: (LDL goal < 70) -Controlled Lab Results  Component Value Date   LDLCALC 56 08/18/2020  -Current treatment: Rosuvastatin 10mg  - 1 tablet daily  Aspirin 81mg  - 1 tablet daily  Fish Oils 1200mg  - 1 capsule daily  -Medications previously tried: atorvastatin   -Current dietary patterns: reports to diet that is minimal in foods high in cholesterol -Current exercise habits: walks when she is able to  -Educated on Cholesterol goals;  Benefits of statin for ASCVD risk reduction; Importance of  limiting foods high in cholesterol; Exercise goal of 150 minutes per week; -Counseled on diet and exercise extensively Recommended to continue current medication  Depression (Goal: Promotion of posivie mood) -Controlled -Current treatment: Escitalopram 20mg  - 1 tablet daily  -Medications previously tried/failed: n/a -PHQ9: 0 -Educated on Benefits of medication for symptom control Benefits of cognitive-behavioral therapy with or without medication -Recommended to continue current medication  Osteopenia (Goal: Prevention of fractures / disease progression) -Controlled -Last DEXA Scan: 06/27/2019   T-Score right femoral neck: -2.3  T-Score left femoral neck: -2.1  T-Score lumbar spine: -1.2  10-year probability of major osteoporotic fracture: 14%  10-year probability of hip fracture: 3.5% -Patient is a candidate for pharmacologic treatment due to T-Score -1.0 to -2.5 and 10-year  risk of hip fracture > 3% -Current treatment  Calcium Carbonate $RemoveBeforeDEI'600mg'[id number]$  - 1 tablet twice daily  Vitamin D3 1000 units - 1 tablet daily  -Medications previously tried: alendronate - unable to tolerate - caused leg pains  -Recommend [phone] units of vitamin D daily. Recommend 1200 mg of calcium daily from dietary and supplemental sources. Has trialed fosamax in the past, unable to tolerate, will recheck DEXA in 2023 -Counseled on diet and exercise extensively Recommended to continue current medication   Patient Goals/Self-Care Activities Patient will:  - take medications as prescribed check blood pressure daily as she already has been doing, document, and provide at future appointments  Follow Up Plan: Telephone follow up appointment with care management team member scheduled for: The patient has been provided with contact information for the care management team and has been advised to call with any health related questions or concerns.

## 2020-12-26 DIAGNOSIS — H2511 Age-related nuclear cataract, right eye: Secondary | ICD-10-CM | POA: Diagnosis not present

## 2020-12-26 DIAGNOSIS — H25811 Combined forms of age-related cataract, right eye: Secondary | ICD-10-CM | POA: Diagnosis not present

## 2021-01-26 DIAGNOSIS — Z0100 Encounter for examination of eyes and vision without abnormal findings: Secondary | ICD-10-CM | POA: Diagnosis not present

## 2021-02-09 ENCOUNTER — Telehealth: Payer: Self-pay | Admitting: Internal Medicine

## 2021-02-09 ENCOUNTER — Ambulatory Visit (INDEPENDENT_AMBULATORY_CARE_PROVIDER_SITE_OTHER): Payer: Medicare Other

## 2021-02-09 DIAGNOSIS — Z23 Encounter for immunization: Secondary | ICD-10-CM | POA: Diagnosis not present

## 2021-02-09 NOTE — Telephone Encounter (Signed)
Placed in folder for signature

## 2021-02-09 NOTE — Telephone Encounter (Signed)
Type of form received : Handicap Placard  Form placed in: Provider mailbox  Additional instructions from the patient: mail    Things to remember: [place] office: If form received in person, remind patient that forms take 7-10 business days CMA should attach charge sheet and put on [employer]

## 2021-02-10 NOTE — Telephone Encounter (Signed)
Spoke with patient.  Mailed out today.

## 2021-02-14 ENCOUNTER — Other Ambulatory Visit: Payer: Self-pay | Admitting: Internal Medicine

## 2021-03-23 ENCOUNTER — Telehealth: Payer: Self-pay

## 2021-03-23 NOTE — Progress Notes (Signed)
Chronic Care Management Pharmacy Assistant   Name: Sarah Hendricks  MRN: [id number] DOB: 1946-01-23   Reason for Encounter: Disease State   Conditions to be addressed/monitored: HTN   Recent office visits:  None ID  Recent consult visits:  None ID  Hospital visits:  None in previous 6 months  Medications: Outpatient Encounter Medications as of 03/23/2021  Medication Sig   aspirin 81 MG tablet Take 81 mg by mouth daily.   calcium carbonate (OS-CAL) 600 MG TABS tablet Take 600 mg by mouth 2 (two) times daily with a meal.    carvedilol (COREG) 12.5 MG tablet TAKE 1 TABLET BY MOUTH 2 TIMES DAILY WITH AMEAL   Cholecalciferol (EQL VITAMIN D3) 1000 units tablet Take 1 tablet (1,000 Units total) by mouth daily. (Patient taking differently: Take 1,000 Units by mouth in the morning and at bedtime.)   escitalopram (LEXAPRO) 20 MG tablet TAKE 1 TABLET(20 MG) BY MOUTH DAILY   losartan (COZAAR) 100 MG tablet TAKE 1 TABLET(100 MG) BY MOUTH DAILY   Omega-3 Fatty Acids (FISH OIL) 1200 MG CAPS Take by mouth daily.   rosuvastatin (CRESTOR) 10 MG tablet TAKE 1 TABLET BY MOUTH EVERY DAY   No facility-administered encounter medications on file as of 03/23/2021.   Reviewed chart prior to disease state call. Spoke with patient regarding BP  Recent Office Vitals: BP Readings from Last 3 Encounters:  09/09/20 (!) 142/57  08/18/20 136/70  04/28/20 (!) 162/86   Pulse Readings from Last 3 Encounters:  09/09/20 64  08/18/20 66  04/28/20 67    Wt Readings from Last 3 Encounters:  09/09/20 150 lb (68 kg)  08/27/20 150 lb (68 kg)  08/18/20 151 lb (68.5 kg)     Kidney Function Lab Results  Component Value Date/Time   CREATININE 0.84 08/18/2020 08:44 AM   CREATININE 0.83 07/26/2019 09:40 AM   GFR 68.90 08/18/2020 08:44 AM   GFRNONAA 71 07/26/2019 09:40 AM   GFRAA 81 07/26/2019 09:40 AM    BMP Latest Ref Rng & Units 08/18/2020 07/26/2019 06/29/2019  Glucose 70 - 99 mg/dL 102(H) 98 99   BUN 6 - 23 mg/dL 14 10 11   Creatinine 0.40 - 1.20 mg/dL 0.84 0.83 0.81  BUN/Creat Ratio 12 - 28 - 12 14  Sodium 135 - 145 mEq/L 128(L) 132(L) 133(L)  Potassium 3.5 - 5.1 mEq/L 3.7 4.3 4.4  Chloride 96 - 112 mEq/L 93(L) 95(L) 96  CO2 19 - 32 mEq/L 29 23 26   Calcium 8.4 - 10.5 mg/dL 9.4 9.8 10.0    Current antihypertensive regimen:  Losartan 100 mg 1 tab daily Carvedilol 12.5 mg 1 tab two times daily  How often are you checking your Blood Pressure? weekly  Current home BP readings: 127/65  What recent interventions/DTPs have been made by any provider to improve Blood Pressure control since last CPP Visit: None ID  Any recent hospitalizations or ED visits since last visit with CPP? No  What diet changes have been made to improve Blood Pressure Control?  Patient states she has not made any changes to her diet What exercise is being done to improve your Blood Pressure Control?  Patient is walking 2 or 3 miles daily  Adherence Review: Is the patient currently on ACE/ARB medication? Yes Does the patient have >5 day gap between last estimated fill dates? No   Care Gaps: Colonoscopy-09/09/20 Diabetic Foot Exam-NA Mammogram-11/20/20 Ophthalmology-NA Dexa Scan - NA Annual Well Visit - NA Micro albumin-NA Hemoglobin A1c- 08/18/20  Star Rating Drugs: Losartan 100 mg-last fill 02/03/21 90 ds Rosuvastatin 10 mg-last fill 02/13/21 90 ds  Ethelene Hal Clinical Pharmacist Assistant [phone]

## 2021-04-25 NOTE — Progress Notes (Deleted)
Cardiology Office Note:    Date:  04/25/2021   ID:  Sarah Hendricks, DOB 08/31/1945, MRN [id number]  PCP:  Binnie Rail, MD   [facility] Providers Cardiologist:  Ena Dawley, MD (Inactive) {   Referring MD: Binnie Rail, MD    History of Present Illness:    Sarah Hendricks is a 75 y.o. female with a hx of HTN, HLD, CAD s/p PCI in 2008, and breast cancer s/p lumpectomy and XRT (no chemo) who was previously followed by Dr. Meda Coffee who now returns to clinic for follow-up.  Last saw Dr. Meda Coffee in 2019. Underwent exercise stress test in 2018 which was negative for ischemia.  Today, ***  Past Medical History:  Diagnosis Date   Allergy    Arthritis    Breast cancer ([place]) 1994   lumpectomy and XRT, no chemo   CAD (coronary artery disease) 2008   [place]CA, in [place]   GERD (gastroesophageal reflux disease)    History of chicken pox    Hyperlipidemia    Hypertension    Myocardial infarction ([place]) 03/15/2007   stent placed   Vitamin D deficiency     Past Surgical History:  Procedure Laterality Date   BREAST BIOPSY Left 1994   BREAST LUMPECTOMY Left 1994   CORONARY ANGIOPLASTY WITH STENT PLACEMENT  2008   CRANIECTOMY FOR DEPRESSED SKULL FRACTURE  1986   TONSILLECTOMY  1966    Current Medications: No outpatient medications have been marked as taking for the 04/30/21 encounter (Appointment) with [name], MD.     Allergies:   Acetaminophen-codeine, Tigan [trimethobenzamide], Alendronate, Atorvastatin, and Codeine   Social History   Socioeconomic History   Marital status: Married    Spouse name: Not on file   Number of children: Not on file   Years of education: Not on file   Highest education level: Not on file  Occupational History   Not on file  Tobacco Use   Smoking status: Never   Smokeless tobacco: Never  Vaping Use   Vaping Use: Never used  Substance and Sexual Activity   Alcohol use: No   Drug use: No   Sexual activity: Not on file   Other Topics Concern   Not on file  Social History Narrative   Married, lives with spouse   Retired from [employer] irregularly, rides an exercise bike   Social Determinants of Radio broadcast assistant Strain: Low Risk    Difficulty of Paying Living Expenses: Not hard at all  Food Insecurity: No Food Insecurity   Worried About Charity fundraiser in the Last Year: Never true   Arboriculturist in the Last Year: Never true  Transportation Needs: No Transportation Needs   Lack of Transportation (Medical): No   Lack of Transportation (Non-Medical): No  Physical Activity: Sufficiently Active   Days of Exercise per Week: 5 days   Minutes of Exercise per Session: 30 min  Stress: No Stress Concern Present   Feeling of Stress : Not at all  Social Connections: Socially Integrated   Frequency of Communication with Friends and Family: More than three times a week   Frequency of Social Gatherings with Friends and Family: Twice a week   Attends Religious Services: More than 4 times per year   Active Member of [employer] or Organizations: No   Attends Music therapist: More than 4 times per year   Marital Status: Married  Family History: The patient's ***family history includes Arthritis in her father; Breast cancer in her mother; Heart disease in her brother, father, maternal grandfather, and mother; Hypertension in her mother; Stroke in her maternal grandmother and mother. There is no history of Colon cancer, Esophageal cancer, Rectal cancer, or Stomach cancer.  ROS:   Please see the history of present illness.    *** All other systems reviewed and are negative.  EKGs/Labs/Other Studies Reviewed:    The following studies were reviewed today: ETT Jun 21, 2016: The patient walked for 6:16 of a Bruce protocol. Peak HR of 141 which is 93% predicted maxmial HR. There were no ST or T wave changes to suggest ischemia. BP response to exercise was normal.  EKG:  EKG  is *** ordered today.  The ekg ordered today demonstrates ***  Recent Labs: 08/18/2020: ALT 13; BUN 14; Creatinine, Ser 0.84; Hemoglobin 13.5; Platelets 256.0; Potassium 3.7; Sodium 128  Recent Lipid Panel    Component Value Date/Time   CHOL 125 08/18/2020 0844   TRIG 103.0 08/18/2020 0844   HDL 48.30 08/18/2020 0844   CHOLHDL 3 08/18/2020 0844   VLDL 20.6 08/18/2020 0844   LDLCALC 56 08/18/2020 0844   LDLDIRECT 87.0 06/12/2018 0946     Risk Assessment/Calculations:   {Does this patient have ATRIAL FIBRILLATION?:[phone]}       Physical Exam:    VS:  There were no vitals taken for this visit.    Wt Readings from Last 3 Encounters:  09/09/20 150 lb (68 kg)  08/27/20 150 lb (68 kg)  08/18/20 151 lb (68.5 kg)     GEN: *** Well nourished, well developed in no acute distress HEENT: Normal NECK: No JVD; No carotid bruits LYMPHATICS: No lymphadenopathy CARDIAC: ***RRR, no murmurs, rubs, gallops RESPIRATORY:  Clear to auscultation without rales, wheezing or rhonchi  ABDOMEN: Soft, non-tender, non-distended MUSCULOSKELETAL:  No edema; No deformity  SKIN: Warm and dry NEUROLOGIC:  Alert and oriented x 3 PSYCHIATRIC:  Normal affect   ASSESSMENT:    No diagnosis found. PLAN:    In order of problems listed above:  #CAD s/p PCI in 2008: -Continue ASA 81mg  daily -Continue crestor 10mg  daily -Continue losartan 100mg  daily -Continue coreg 12.5mg  BID   #HTN: -Continue losartan 100mg  daily -Continue coreg 12.5mg  daily  #HLD: -Continue crestor 10mg  daily       {Are you ordering a CV Procedure (e.g. stress test, cath, DCCV, TEE, etc)?   Press F2        :[id number]}    Medication Adjustments/Labs and Tests Ordered: Current medicines are reviewed at length with the patient today.  Concerns regarding medicines are outlined above.  No orders of the defined types were placed in this encounter.  No orders of the defined types were placed in this encounter.   There  are no Patient Instructions on file for this visit.   Signed, [name], MD  04/25/2021 6:29 PM    [place]

## 2021-04-30 ENCOUNTER — Ambulatory Visit: Payer: Medicare Other | Admitting: Cardiology

## 2021-04-30 ENCOUNTER — Encounter: Payer: Self-pay | Admitting: Cardiology

## 2021-04-30 ENCOUNTER — Other Ambulatory Visit: Payer: Self-pay

## 2021-04-30 VITALS — BP 140/80 | HR 63 | Ht 63.0 in | Wt 147.4 lb

## 2021-04-30 DIAGNOSIS — E871 Hypo-osmolality and hyponatremia: Secondary | ICD-10-CM | POA: Diagnosis not present

## 2021-04-30 DIAGNOSIS — E782 Mixed hyperlipidemia: Secondary | ICD-10-CM

## 2021-04-30 DIAGNOSIS — Z79899 Other long term (current) drug therapy: Secondary | ICD-10-CM | POA: Diagnosis not present

## 2021-04-30 DIAGNOSIS — R251 Tremor, unspecified: Secondary | ICD-10-CM

## 2021-04-30 DIAGNOSIS — I2510 Atherosclerotic heart disease of native coronary artery without angina pectoris: Secondary | ICD-10-CM | POA: Diagnosis not present

## 2021-04-30 DIAGNOSIS — I10 Essential (primary) hypertension: Secondary | ICD-10-CM

## 2021-04-30 MED ORDER — PROPRANOLOL HCL 40 MG PO TABS
40.0000 mg | ORAL_TABLET | Freq: Two times a day (BID) | ORAL | 1 refills | Status: DC
Start: 2021-04-30 — End: 2021-05-04

## 2021-04-30 NOTE — Patient Instructions (Signed)
Medication Instructions:   STOP TAKING CARVEDILOL NOW  START TAKING PROPRANOLOL 40 MG BY MOUTH TWICE DAILY  *If you need a refill on your cardiac medications before your next appointment, please call your pharmacy*   Lab Work:  NEXT Thursday 12/22 HERE IN THE OFFICE--CHECK BMET AT THAT TIME  If you have labs (blood work) drawn today and your tests are completely normal, you will receive your results only by: [place] (if you have MyChart) OR A paper copy in the mail If you have any lab test that is abnormal or we need to change your treatment, we will call you to review the results.   Follow-Up: At [facility], you and your health needs are our priority.  As part of our continuing mission to provide you with exceptional heart care, we have created designated Provider Care Teams.  These Care Teams include your primary Cardiologist (physician) and Advanced Practice Providers (APPs -  Physician Assistants and Nurse Practitioners) who all work together to provide you with the care you need, when you need it.  We recommend signing up for the patient portal called "MyChart".  Sign up information is provided on this After Visit Summary.  MyChart is used to connect with patients for Virtual Visits (Telemedicine).  Patients are able to view lab/test results, encounter notes, upcoming appointments, etc.  Non-urgent messages can be sent to your provider as well.   To learn more about what you can do with MyChart, go to [email or web address].    Your next appointment:   6 month(s)  The format for your next appointment:   In Person  Provider:   DR. Johney Frame   Other Instructions  PLEASE START MONITORING YOUR BLOOD PRESSURES AND REPORT ANY ABNORMAL READINGS TO THE OFFICE  PLEASE START DRINKING GATORADE ZERO FOR ONE WEEK ONLY TO GET YOUR SODIUM LEVELS UP, THEN COME INTO THE OFFICE NEXT Thursday 12/22 FOR LAB.

## 2021-04-30 NOTE — Progress Notes (Signed)
Sarah Bergeron, MD  Physician Cardiology Progress Notes    Sign when Signing Visit Creation Time:  04/30/2021    Sign when Signing Visit      Expand All Collapse All  Cardiology Office Note:     Date:  04/30/2021    ID:  Sarah Hendricks, DOB 06-13-1945, MRN [id number]   PCP:  Binnie Rail, MD              [facility] Providers Cardiologist:  Ena Dawley, MD (Inactive) {     Referring MD: Binnie Rail, MD      History of Present Illness:     Sarah Hendricks is a 75 y.o. female with a hx of HTN, HLD, CAD s/p PCI in 2008, and breast cancer s/p lumpectomy and XRT (no chemo) who was previously followed by Dr. Meda Coffee who now returns to clinic for follow-up.   Last saw Dr. Meda Coffee in 2018. Underwent exercise stress test in 2018 which was negative for ischemia.   Today, she states she feels overall well but reports that  she has been having some "tremors." She states that the onset was from after her husband was in the hospital for a stroke. She adds that head would "shake" as well. She is wondering if her tremors are from the Losartan or stress from her husband being in the hospital. She has been started on lexapro per PCP but this has not helped her symptoms much.  Otherwise, she is doing well from a CV standpoint. No chest pain, lightheadedness, dizziness, palpitations or SOB. Blood pressures running high at home mainly 140s. Of note, her Na was 128 on last check in 08/2020. She attributes this to a low Na diet (only eats one big meal a day and otherwise has snacks).         Past Medical History:  Diagnosis Date   Allergy     Arthritis     Breast cancer ([place]) 1994    lumpectomy and XRT, no chemo   CAD (coronary artery disease) 2008    [place]CA, in [place]   GERD (gastroesophageal reflux disease)     History of chicken pox     Hyperlipidemia     Hypertension     Myocardial infarction ([place]) 03/15/2007    stent placed   Vitamin D deficiency             Past  Surgical History:  Procedure Laterality Date   BREAST BIOPSY Left 1994   BREAST LUMPECTOMY Left 1994   CORONARY ANGIOPLASTY WITH STENT PLACEMENT   2008   CRANIECTOMY FOR DEPRESSED SKULL FRACTURE   1986   TONSILLECTOMY   1966      Current Medications: Active Medications  No outpatient medications have been marked as taking for the 04/30/21 encounter (Appointment) with Sarah Bergeron, MD.        Allergies:   Acetaminophen-codeine, Tigan [trimethobenzamide], Alendronate, Atorvastatin, and Codeine    Social History         Socioeconomic History   Marital status: Married      Spouse name: Not on file   Number of children: Not on file   Years of education: Not on file   Highest education level: Not on file  Occupational History   Not on file  Tobacco Use   Smoking status: Never   Smokeless tobacco: Never  Vaping Use   Vaping Use: Never used  Substance and Sexual Activity   Alcohol use: No   Drug  use: No   Sexual activity: Not on file  Other Topics Concern   Not on file  Social History Narrative    Married, lives with spouse    Retired from [employer] irregularly, rides an exercise bike    Social Determinants of Scientist, research (life sciences) Strain: Low Risk    Difficulty of Paying Living Expenses: Not hard at all  Food Insecurity: No Food Insecurity   Worried About Charity fundraiser in the Last Year: Never true   Arboriculturist in the Last Year: Never true  Transportation Needs: No Transportation Needs   Lack of Transportation (Medical): No   Lack of Transportation (Non-Medical): No  Physical Activity: Sufficiently Active   Days of Exercise per Week: 5 days   Minutes of Exercise per Session: 30 min  Stress: No Stress Concern Present   Feeling of Stress : Not at all  Social Connections: Socially Integrated   Frequency of Communication with Friends and Family: More than three times a week   Frequency of Social Gatherings with Friends  and Family: Twice a week   Attends Religious Services: More than 4 times per year   Active Member of [employer] or Organizations: No   Attends Music therapist: More than 4 times per year   Marital Status: Married      Family History: The patient's family history includes Arthritis in her father; Breast cancer in her mother; Heart disease in her brother, father, maternal grandfather, and mother; Hypertension in her mother; Stroke in her maternal grandmother and mother. There is no history of Colon cancer, Esophageal cancer, Rectal cancer, or Stomach cancer.   ROS:   Please see the history of present illness.    (+) Stress (+)Tremors  (+) All other systems reviewed and are negative.   EKGs/Labs/Other Studies Reviewed:     The following studies were reviewed today: ETT 06-06-16: The patient walked for 6:16 of a Bruce protocol. Peak HR of 141 which is 93% predicted maxmial HR. There were no ST or T wave changes to suggest ischemia. BP response to exercise was normal.   EKG:    04/30/21: Sinus . Rate 63 bpm.  Recent Labs: 08/18/2020: ALT 13; BUN 14; Creatinine, Ser 0.84; Hemoglobin 13.5; Platelets 256.0; Potassium 3.7; Sodium 128  Recent Lipid Panel Labs (Brief)          Component Value Date/Time    CHOL 125 08/18/2020 0844    TRIG 103.0 08/18/2020 0844    HDL 48.30 08/18/2020 0844    CHOLHDL 3 08/18/2020 0844    VLDL 20.6 08/18/2020 0844    LDLCALC 56 08/18/2020 0844    LDLDIRECT 87.0 06/12/2018 0946          Risk Assessment/Calculations:             Physical Exam:     VS:  There were no vitals taken for this visit.        Wt Readings from Last 3 Encounters:  09/09/20 150 lb (68 kg)  08/27/20 150 lb (68 kg)  08/18/20 151 lb (68.5 kg)      GEN:  Well nourished, well developed in no acute distress HEENT: Normal NECK: No JVD; No carotid bruits CARDIAC: RRR, no murmurs, rubs, gallops RESPIRATORY:  Clear to auscultation without rales, wheezing or  rhonchi  ABDOMEN: Soft, non-tender, non-distended MUSCULOSKELETAL:  No edema; No deformity  SKIN: Warm and  dry NEUROLOGIC:  Alert and oriented x 3 PSYCHIATRIC:  Normal affect    ASSESSMENT:     Tremors Hyponatremia Coronary Artery Disease History of PCI Hypertension Hyperlipidemia PLAN:     In order of problems listed above:   #Tremors: Suspect essential tremor vs stress related to husband's stroke vs possible hyponatremia. Unlikely to be related to losartan as not a known side-effect of this medication. Will trial propranolol and monitor for response.  -Change coreg to propranolol 40mg  BID and monitor response -Asked her to drink a gatorade zero or poweraide zero for 1 week to see if Na improves as she admits to not having frequent meals or a lot of sodium in her diet -Repeat BMET next week  #HypoNa: Patient attributes this to diet. Will try to increase solute intake and if fails to improve, will refer to endocrinology. -Asked her to drink a gatorade zero or poweraide zero for 1 week to see if Na improves as she admits to not having frequent meals or a lot of sodium in her diet -Repeat BMET next week  #CAD s/p PCI in 2008: Doing well without anginal symptoms. Patient remains active.  -Continue ASA 81mg  daily -Continue crestor 10mg  daily -Continue losartan 100mg  daily -Change coreg to propranolol for tremors as above   #HTN: -Continue losartan 100mg  daily -Change coreg to propranolol 40mg  BID and monitor response   #HLD: LDL 56. Goal <70. -Continue crestor 10mg  daily       F/U 6 in months   Medication Adjustments/Labs and Tests Ordered: Current medicines are reviewed at length with the patient today.  Concerns regarding medicines are outlined above.  No orders of the defined types were placed in this encounter.   No orders of the defined types were placed in this encounter.     There are no Patient Instructions on file for this visit.     I,Tinashe  Williams,acting as a Education administrator for Sarah Bergeron, MD.,have documented all relevant documentation on the behalf of Sarah Bergeron, MD,as directed by  Sarah Bergeron, MD while in the presence of Sarah Bergeron, MD.   I, Sarah Bergeron, MD, have reviewed all documentation for this visit. The documentation on 04/30/21 for the exam, diagnosis, procedures, and orders are all accurate and complete.   Signed, Sarah Bergeron, MD  04/25/2021 6:29 PM    [place]

## 2021-05-01 ENCOUNTER — Other Ambulatory Visit: Payer: Self-pay

## 2021-05-01 MED ORDER — LOSARTAN POTASSIUM 100 MG PO TABS: ORAL_TABLET | ORAL | 3 refills | Status: DC

## 2021-05-04 ENCOUNTER — Encounter: Payer: Self-pay | Admitting: Cardiology

## 2021-05-04 ENCOUNTER — Other Ambulatory Visit: Payer: Self-pay | Admitting: Cardiology

## 2021-05-04 DIAGNOSIS — I10 Essential (primary) hypertension: Secondary | ICD-10-CM

## 2021-05-04 MED ORDER — PROPRANOLOL HCL 80 MG PO TABS: 80.0000 mg | ORAL_TABLET | Freq: Two times a day (BID) | ORAL | 3 refills | Status: DC

## 2021-05-04 MED ORDER — PROPRANOLOL HCL 10 MG PO TABS: 80.0000 mg | ORAL_TABLET | Freq: Two times a day (BID) | ORAL | Status: DC

## 2021-05-07 ENCOUNTER — Other Ambulatory Visit: Payer: Medicare Other | Admitting: *Deleted

## 2021-05-07 ENCOUNTER — Other Ambulatory Visit: Payer: Self-pay

## 2021-05-07 DIAGNOSIS — Z79899 Other long term (current) drug therapy: Secondary | ICD-10-CM | POA: Diagnosis not present

## 2021-05-07 DIAGNOSIS — I2510 Atherosclerotic heart disease of native coronary artery without angina pectoris: Secondary | ICD-10-CM | POA: Diagnosis not present

## 2021-05-07 DIAGNOSIS — E871 Hypo-osmolality and hyponatremia: Secondary | ICD-10-CM | POA: Diagnosis not present

## 2021-05-07 DIAGNOSIS — E782 Mixed hyperlipidemia: Secondary | ICD-10-CM

## 2021-05-07 DIAGNOSIS — I10 Essential (primary) hypertension: Secondary | ICD-10-CM

## 2021-05-07 LAB — BASIC METABOLIC PANEL
BUN/Creatinine Ratio: 10 — ABNORMAL LOW (ref 12–28)
BUN: 9 mg/dL (ref 8–27)
CO2: 25 mmol/L (ref 20–29)
Calcium: 9.1 mg/dL (ref 8.7–10.3)
Chloride: 96 mmol/L (ref 96–106)
Creatinine, Ser: 0.88 mg/dL (ref 0.57–1.00)
Glucose: 87 mg/dL (ref 70–99)
Potassium: 4.5 mmol/L (ref 3.5–5.2)
Sodium: 134 mmol/L (ref 134–144)
eGFR: 69 mL/min/{1.73_m2} (ref >59)

## 2021-05-12 ENCOUNTER — Other Ambulatory Visit: Payer: Self-pay | Admitting: *Deleted

## 2021-05-12 MED ORDER — ROSUVASTATIN CALCIUM 10 MG PO TABS: 10.0000 mg | ORAL_TABLET | Freq: Every day | ORAL | 3 refills | Status: DC

## 2021-05-14 ENCOUNTER — Encounter: Payer: Self-pay | Admitting: Cardiology

## 2021-05-14 ENCOUNTER — Other Ambulatory Visit: Payer: Self-pay | Admitting: Cardiology

## 2021-05-14 MED ORDER — AMLODIPINE BESYLATE 5 MG PO TABS: 5.0000 mg | ORAL_TABLET | Freq: Every day | ORAL | 3 refills | Status: DC

## 2021-05-27 ENCOUNTER — Encounter: Payer: Self-pay | Admitting: Cardiology

## 2021-05-27 MED ORDER — AMLODIPINE BESYLATE 10 MG PO TABS: 10.0000 mg | ORAL_TABLET | Freq: Every day | ORAL | 2 refills | Status: DC

## 2021-05-27 NOTE — Telephone Encounter (Signed)
Increased dose of amlodipine 10 mg po daily sent to the pts pharmacy on file.  This was advised by Dr. Johney Frame to the pt, via this mychart message.  Pt aware script has been sent.

## 2021-06-23 ENCOUNTER — Ambulatory Visit (INDEPENDENT_AMBULATORY_CARE_PROVIDER_SITE_OTHER): Payer: Medicare Other

## 2021-06-23 DIAGNOSIS — I10 Essential (primary) hypertension: Secondary | ICD-10-CM

## 2021-06-23 DIAGNOSIS — E7849 Other hyperlipidemia: Secondary | ICD-10-CM

## 2021-06-23 NOTE — Patient Instructions (Signed)
Visit Information  Following are the goals we discussed today:   Track and Monitor My Blood Pressure   Timeframe:  Long-Range Goal Priority:  High Start Date:   12/23/2020                          Expected End Date: 06/25/2022                      Follow Up Date 12/21/2021   - check blood pressure daily - choose a place to take my blood pressure (home, clinic or office, retail store) - write blood pressure results in a log or diary    Why is this important?   You won't feel high blood pressure, but it can still hurt your blood vessels.  High blood pressure can cause heart or kidney problems. It can also cause a stroke.  Making lifestyle changes like losing a little weight or eating less salt will help.  Checking your blood pressure at home and at different times of the day can help to control blood pressure.  If the doctor prescribes medicine remember to take it the way the doctor ordered.  Call the office if you cannot afford the medicine or if there are questions about it.   Plan: Telephone follow up appointment with care management team member scheduled for:  6 months  The patient has been provided with contact information for the care management team and has been advised to call with any health related questions or concerns.   Tomasa Blase, PharmD Clinical Pharmacist, [facility]   Please call the care guide team at [phone] if you need to cancel or reschedule your appointment.   Patient verbalizes understanding of instructions and care plan provided today and agrees to view in [place]. Active MyChart status confirmed with patient.

## 2021-06-23 NOTE — Progress Notes (Signed)
Chronic Care Management Pharmacy Note  06/23/2021 Name:  Sarah Hendricks MRN:  [id number] DOB:  May 06, 1946  Summary: - Patient reports compliance to her current medications, denies any issues with medications at this time -Reports since switching to propranolol tremors/ shakes have stopped, BP well controlled since most recent increase in amlodipine, following closely with cardiology - recent Bps have been at goal -Mood remains under good control on current dose of escitalopram per patient   Recommendations/Changes made from today's visit: -Recommending no changes at this time, patient to continue to reports Bps to cardiology as directed, patient to reach out to clinic with any issues or concerns about her medications    Subjective: Sarah Hendricks is an 76 y.o. year old female who is a primary patient of Burns, Claudina Lick, MD.  The CCM team was consulted for assistance with disease management and care coordination needs.    Engaged with patient by telephone for follow up visit in response to provider referral for pharmacy case management and/or care coordination services.   Consent to Services:  The patient was given the following information about Chronic Care Management services today, agreed to services, and gave verbal consent: 1. CCM service includes personalized support from designated clinical staff supervised by the primary care provider, including individualized plan of care and coordination with other care providers 2. 24/7 contact phone numbers for assistance for urgent and routine care needs. 3. Service will only be billed when office clinical staff spend 20 minutes or more in a month to coordinate care. 4. Only one practitioner may furnish and bill the service in a calendar month. 5.The patient may stop CCM services at any time (effective at the end of the month) by phone call to the office staff. 6. The patient will be responsible for cost sharing (co-pay) of up to 20% of the  service fee (after annual deductible is met). Patient agreed to services and consent obtained.  Patient Care Team: Binnie Rail, MD as PCP - General (Internal Medicine) Dorothy Spark, MD as PCP - Cardiology (Cardiology) Alanda Slim Neena Rhymes, MD as Consulting Physician (Ophthalmology) Delice Bison Darnelle Maffucci, [facility] as Pharmacist (Pharmacist)  Recent office visits: None since last visit    Recent consult visits:  04/30/2021 - Dr. Johney Frame - Cardiology - notes tremors/ shakes - coreg d/c'd, propranolol 40mg  BID started   Since visit propranolol increased to 80mg  BID / amlodipine started - titrated to 10mg  daily    Hospital visits:  None in previous 6 months  Objective:  Lab Results  Component Value Date   CREATININE 0.88 05/07/2021   BUN 9 05/07/2021   GFR 68.90 08/18/2020   GFRNONAA 71 07/26/2019   GFRAA 81 07/26/2019   NA 134 05/07/2021   K 4.5 05/07/2021   CALCIUM 9.1 05/07/2021   CO2 25 05/07/2021   GLUCOSE 87 05/07/2021    Lab Results  Component Value Date/Time   HGBA1C 5.8 08/18/2020 08:44 AM   HGBA1C 5.8 06/18/2019 09:44 AM   GFR 68.90 08/18/2020 08:44 AM   GFR 74.74 06/18/2019 09:44 AM    Last diabetic Eye exam:  No results found for: HMDIABEYEEXA  Last diabetic Foot exam:  No results found for: HMDIABFOOTEX   Lab Results  Component Value Date   CHOL 125 08/18/2020   HDL 48.30 08/18/2020   LDLCALC 56 08/18/2020   LDLDIRECT 87.0 06/12/2018   TRIG 103.0 08/18/2020   CHOLHDL 3 08/18/2020    Hepatic Function Latest Ref Rng & Units 08/18/2020 06/18/2019  06/12/2018  Total Protein 6.0 - 8.3 g/dL 7.0 7.1 6.8  Albumin 3.5 - 5.2 g/dL 4.3 4.2 4.3  AST 0 - 37 U/L $Remo'17 18 14  'sooEf$ ALT 0 - 35 U/L $Remo'13 16 13  'zJmzI$ Alk Phosphatase 39 - 117 U/L 65 73 86  Total Bilirubin 0.2 - 1.2 mg/dL 0.7 0.6 0.7  Bilirubin, Direct 0.0 - 0.3 mg/dL - - -    Lab Results  Component Value Date/Time   TSH 1.52 06/18/2019 09:44 AM   TSH 1.45 06/12/2018 09:46 AM    CBC Latest Ref Rng & Units  08/18/2020 06/18/2019 06/12/2018  WBC 4.0 - 10.5 K/uL 5.8 7.2 7.2  Hemoglobin 12.0 - 15.0 g/dL 13.5 14.0 14.9  Hematocrit 36.0 - 46.0 % 38.3 41.0 43.2  Platelets 150.0 - 400.0 K/uL 256.0 259.0 266.0    Lab Results  Component Value Date/Time   VD25OH 60.11 06/18/2019 09:44 AM   VD25OH 39 09/19/2013 10:37 AM    Clinical ASCVD: Yes  The ASCVD Risk score (Arnett DK, et al., 2019) failed to calculate for the following reasons:   The valid total cholesterol range is 130 to 320 mg/dL    Depression screen [facility] 2/9 12/23/2020 09/29/2020 08/18/2020  Decreased Interest 0 0 0  Down, Depressed, Hopeless 0 0 0  PHQ - 2 Score 0 0 0  Altered sleeping 0 0 0  Tired, decreased energy 0 0 0  Change in appetite 0 0 0  Feeling bad or failure about yourself  0 0 0  Trouble concentrating 0 0 0  Moving slowly or fidgety/restless 0 0 0  Suicidal thoughts 0 0 0  PHQ-9 Score 0 0 0    Social History   Tobacco Use  Smoking Status Never  Smokeless Tobacco Never   BP Readings from Last 3 Encounters:  04/30/21 140/80  09/09/20 (!) 142/57  08/18/20 136/70   Pulse Readings from Last 3 Encounters:  04/30/21 63  09/09/20 64  08/18/20 66   Wt Readings from Last 3 Encounters:  04/30/21 147 lb 6.4 oz (66.9 kg)  09/09/20 150 lb (68 kg)  08/27/20 150 lb (68 kg)   BMI Readings from Last 3 Encounters:  04/30/21 26.11 kg/m  09/09/20 26.57 kg/m  08/27/20 26.57 kg/m    Assessment/Interventions: Review of patient past medical history, allergies, medications, health status, including review of consultants reports, laboratory and other test data, was performed as part of comprehensive evaluation and provision of chronic care management services.   SDOH:  (Social Determinants of Health) assessments and interventions performed: Yes  SDOH Screenings   Alcohol Screen: Low Risk    Last Alcohol Screening Score (AUDIT): 0  Depression (PHQ2-9): Low Risk    PHQ-2 Score: 0  Financial Resource Strain: Low Risk     Difficulty of Paying Living Expenses: Not hard at all  Food Insecurity: No Food Insecurity   Worried About Charity fundraiser in the Last Year: Never true   Ran Out of Food in the Last Year: Never true  Housing: Low Risk    Last Housing Risk Score: 0  Physical Activity: Sufficiently Active   Days of Exercise per Week: 5 days   Minutes of Exercise per Session: 30 min  Social Connections: Engineer, building services of Communication with Friends and Family: More than three times a week   Frequency of Social Gatherings with Friends and Family: Twice a week   Attends Religious Services: More than 4 times per year   Active Member  of Clubs or Organizations: No   Attends Music therapist: More than 4 times per year   Marital Status: Married  Stress: No Stress Concern Present   Feeling of Stress : Not at all  Tobacco Use: Low Risk    Smoking Tobacco Use: Never   Smokeless Tobacco Use: Never   Passive Exposure: Not on file  Transportation Needs: No Transportation Needs   Lack of Transportation (Medical): No   Lack of Transportation (Non-Medical): No    CCM Care Plan  Allergies  Allergen Reactions   Acetaminophen-Codeine Other (See Comments)    Bad dreams   Tigan [Trimethobenzamide] Other (See Comments)    Bad dreams   Alendronate     Caused leg pains    Atorvastatin Other (See Comments)    Pt reports "causes bilateral leg pains."   Codeine     Bad dreams    Medications Reviewed Today     Reviewed by Tomasa Blase, [facility] (Pharmacist) on 06/23/21 at 1034  Med List Status: <None>   Medication Order Taking? Sig Documenting Provider Last Dose Status Informant  amLODipine (NORVASC) 10 MG tablet [id number] Yes Take 1 tablet (10 mg total) by mouth daily. [name], MD Taking Active   aspirin 81 MG tablet [id number] Yes Take 81 mg by mouth daily. [provider] Taking Active   calcium carbonate (OS-CAL) 600 MG TABS tablet [id number] Yes Take 600 mg  by mouth 2 (two) times daily with a meal.  [provider] Taking Active   Cholecalciferol (EQL VITAMIN D3) 1000 units tablet [id number] Yes Take 1 tablet (1,000 Units total) by mouth daily. Binnie Rail, MD Taking Active   escitalopram (LEXAPRO) 20 MG tablet [id number] Yes TAKE 1 TABLET(20 MG) BY MOUTH DAILY Burns, Claudina Lick, MD Taking Active   losartan (COZAAR) 100 MG tablet [id number] Yes TAKE 1 TABLET(100 MG) BY MOUTH DAILY [name], MD Taking Active   Omega-3 Fatty Acids (FISH OIL) 1200 MG CAPS [id number] Yes Take by mouth daily. [provider] Taking Active   propranolol (INDERAL) 80 MG tablet [id number] Yes Take 1 tablet (80 mg total) by mouth 2 (two) times daily. [name], MD Taking Active   rosuvastatin (CRESTOR) 10 MG tablet [id number] Yes Take 1 tablet (10 mg total) by mouth daily. [name], MD Taking Active             Patient Active Problem List   Diagnosis Date Noted   Acute sinus infection 10/17/2020   Stress and adjustment reaction 01/04/2020   Prediabetes 05/26/2017   Osteopenia w/ high frax 05/30/2014   CAD (coronary artery disease)    Hypertension    Hyperlipidemia    History of breast cancer    Vitamin D deficiency     Immunization History  Administered Date(s) Administered   Fluad Quad(high Dose 65+) 02/15/2019, 02/28/2020, 02/09/2021   Influenza, High Dose Seasonal PF 02/15/2017, 02/13/2018   Influenza-Unspecified 03/17/2014, 02/16/2015, 02/29/2016   Moderna Sars-Covid-2 Vaccination 07/13/2019, 08/10/2019, 05/22/2020   Pneumococcal Conjugate-13 05/30/2014   Pneumococcal Polysaccharide-23 09/26/2013   Tdap 02/13/2018    Conditions to be addressed/monitored:  Hypertension, Hyperlipidemia, Depression, and Osteopenia  Care Plan : CCM Care Plan  Updates made by Tomasa Blase, [place] since 06/23/2021 12:00 AM     Problem: HTN, HLD, Depression, Osteopenia   Priority: High  Onset Date: 12/23/2020      Long-Range Goal: Disease Management   Start Date: 12/23/2020  Expected End Date: 06/23/2022  This Visit's Progress: On track  Recent Progress: On track  Priority: High  Note:   Current Barriers:  Unable to independently monitor therapeutic efficacy  Pharmacist Clinical Goal(s):  Patient will achieve adherence to monitoring guidelines and medication adherence to achieve therapeutic efficacy maintain control of blood pressure, LDL, and depression as evidenced by BP logs, next lipid panel, and mood  through collaboration with PharmD and provider.   Interventions: 1:1 collaboration with Binnie Rail, MD regarding development and update of comprehensive plan of care as evidenced by provider attestation and co-signature Inter-disciplinary care team collaboration (see longitudinal plan of care) Comprehensive medication review performed; medication list updated in electronic medical record  Hypertension (BP goal <140/90) -Controlled -Current treatment: Losartan 100mg  - 1 tablet daily  Propranolol 80mg  - 1 tablet BID  Amlodipine 10mg  - 1 tablet daily  -Medications previously tried: torsemide, spironolactone, metoprolol tartrate,  coreg  -Current home readings: reports that she has been monitoring BP daily, sending results to cardiology  - BP since increasing amlodipine has been under good control, denies any issues with hypotension  -Current dietary habits: reports to eating a sodium reduce diet -Current exercise habits: tries to walk when she is able  -Denies hypotensive/hypertensive symptoms -Educated on BP goals and benefits of medications for prevention of heart attack, stroke and kidney damage; Daily salt intake goal < 2300 mg; Exercise goal of 150 minutes per week; Importance of home blood pressure monitoring; Proper BP monitoring technique; Symptoms of hypotension and importance of maintaining adequate hydration; -Counseled to monitor BP at home daily, document, and provide log at  future appointments -Counseled on diet and exercise extensively Recommended to continue current medication  Hyperlipidemia / CAD /Previous MI: (LDL goal < 70) -Controlled Lab Results  Component Value Date   LDLCALC 56 08/18/2020  -Current treatment: Rosuvastatin 10mg  - 1 tablet daily  Aspirin 81mg  - 1 tablet daily  Fish Oils 1200mg  - 1 capsule daily  -Medications previously tried: atorvastatin   -Current dietary patterns: reports to diet that is minimal in foods high in cholesterol -Current exercise habits: walks when she is able to  -Educated on Cholesterol goals;  Benefits of statin for ASCVD risk reduction; Importance of limiting foods high in cholesterol; Exercise goal of 150 minutes per week; -Counseled on diet and exercise extensively Recommended to continue current medication  Depression (Goal: Promotion of posivie mood) -Controlled - patient reports she feels mood is well controlled  -Current treatment: Escitalopram 20mg  - 1 tablet daily  -Medications previously tried/failed: n/a -PHQ9: 0 -Educated on Benefits of medication for symptom control Benefits of cognitive-behavioral therapy with or without medication -Recommended to continue current medication  Osteopenia (Goal: Prevention of fractures / disease progression) -Controlled -Last DEXA Scan: 06/27/2019   T-Score right femoral neck: -2.3  T-Score left femoral neck: -2.1  T-Score lumbar spine: -1.2  10-year probability of major osteoporotic fracture: 14%  10-year probability of hip fracture: 3.5% -Patient is a candidate for pharmacologic treatment due to T-Score -1.0 to -2.5 and 10-year risk of hip fracture > 3% -Current treatment  Calcium Carbonate 600mg  - 1 tablet twice daily  Vitamin D3 1000 units - 1 tablet daily  -Medications previously tried: alendronate - unable to tolerate - caused leg pains  - will recheck DEXA in 2023 -Counseled on diet and exercise extensively Recommended to continue current  medication  Patient Goals/Self-Care Activities Patient will:  - take medications as prescribed check blood pressure daily as she already has been doing, document, and provide  at future appointments  Follow Up Plan: Telephone follow up appointment with care management team member scheduled for: 6 months  The patient has been provided with contact information for the care management team and has been advised to call with any health related questions or concerns.     Medication Assistance: None required.  Patient affirms current coverage meets needs.  Patient's preferred pharmacy is:  [id number] [address], [place] - [address] [place] [address] Phone: [phone] Fax: [phone]  Uses pill box? Yes Pt endorses 100% compliance  Care Plan and Follow Up Patient Decision:  Patient agrees to Care Plan and Follow-up.  Plan: Telephone follow up appointment with care management team member scheduled for:  6 months and The patient has been provided with contact information for the care management team and has been advised to call with any health related questions or concerns.   Tomasa Blase, PharmD Clinical Pharmacist, [place]

## 2021-07-14 DIAGNOSIS — I10 Essential (primary) hypertension: Secondary | ICD-10-CM | POA: Diagnosis not present

## 2021-07-14 DIAGNOSIS — E7849 Other hyperlipidemia: Secondary | ICD-10-CM | POA: Diagnosis not present

## 2021-08-05 ENCOUNTER — Encounter: Payer: Self-pay | Admitting: Cardiology

## 2021-08-06 MED ORDER — HYDROCHLOROTHIAZIDE 25 MG PO TABS: 25.0000 mg | ORAL_TABLET | Freq: Every day | ORAL | 1 refills | Status: DC

## 2021-08-06 NOTE — Telephone Encounter (Signed)
Pt aware to stop amlodipine and start HCTZ 25 mg po daily, as indicated and mentioned to her via this message by Dr. Johney Frame.  ?Pt aware that HCTZ script sent to her pharmacy on file.  She is aware to eat regular meals and stay hydrated to avoid low sodium and dehydration with this med.  ?

## 2021-08-13 ENCOUNTER — Other Ambulatory Visit: Payer: Self-pay | Admitting: Internal Medicine

## 2021-08-17 ENCOUNTER — Telehealth: Payer: Self-pay

## 2021-08-17 NOTE — Telephone Encounter (Signed)
Pt husband is calling to report that the pt hasnt been sleeping.  He states that  he wants something called in for her. ? ? Pt has appt on 08/20/21 with Dr. Quay Burow. ? ?Please advise. ?

## 2021-08-17 NOTE — Telephone Encounter (Signed)
Spoke with patient today. ? ?She will wait until appointment on Thursday. ?

## 2021-08-19 ENCOUNTER — Encounter: Payer: Self-pay | Admitting: Internal Medicine

## 2021-08-19 NOTE — Patient Instructions (Addendum)
? ? ? ?Blood work was ordered.   ? ? ?Medications changes include :   none ? ? ?Your prescription(s) have been sent to your pharmacy.  ? ? ?Return in about 6 months (around 02/19/2022) for 6 month f/u, Schedule DEXA-[place]. ? ? ? ? ?Health Maintenance, Female ?Adopting a healthy lifestyle and getting preventive care are important in promoting health and wellness. Ask your health care provider about: ?The right schedule for you to have regular tests and exams. ?Things you can do on your own to prevent diseases and keep yourself healthy. ?What should I know about diet, weight, and exercise? ?Eat a healthy diet ? ?Eat a diet that includes plenty of vegetables, fruits, low-fat dairy products, and lean protein. ?Do not eat a lot of foods that are high in solid fats, added sugars, or sodium. ?Maintain a healthy weight ?Body mass index (BMI) is used to identify weight problems. It estimates body fat based on height and weight. Your health care provider can help determine your BMI and help you achieve or maintain a healthy weight. ?Get regular exercise ?Get regular exercise. This is one of the most important things you can do for your health. Most adults should: ?Exercise for at least 150 minutes each week. The exercise should increase your heart rate and make you sweat (moderate-intensity exercise). ?Do strengthening exercises at least twice a week. This is in addition to the moderate-intensity exercise. ?Spend less time sitting. Even light physical activity can be beneficial. ?Watch cholesterol and blood lipids ?Have your blood tested for lipids and cholesterol at 75 years of age, then have this test every 5 years. ?Have your cholesterol levels checked more often if: ?Your lipid or cholesterol levels are high. ?You are older than 75 years of age. ?You are at high risk for heart disease. ?What should I know about cancer screening? ?Depending on your health history and family history, you may need to have cancer screening at  various ages. This may include screening for: ?Breast cancer. ?Cervical cancer. ?Colorectal cancer. ?Skin cancer. ?Lung cancer. ?What should I know about heart disease, diabetes, and high blood pressure? ?Blood pressure and heart disease ?High blood pressure causes heart disease and increases the risk of stroke. This is more likely to develop in people who have high blood pressure readings or are overweight. ?Have your blood pressure checked: ?Every 3-5 years if you are 56-48 years of age. ?Every year if you are 64 years old or older. ?Diabetes ?Have regular diabetes screenings. This checks your fasting blood sugar level. Have the screening done: ?Once every three years after age 51 if you are at a normal weight and have a low risk for diabetes. ?More often and at a younger age if you are overweight or have a high risk for diabetes. ?What should I know about preventing infection? ?Hepatitis B ?If you have a higher risk for hepatitis B, you should be screened for this virus. Talk with your health care provider to find out if you are at risk for hepatitis B infection. ?Hepatitis C ?Testing is recommended for: ?Everyone born from [birth year] through 1964. ?Anyone with known risk factors for hepatitis C. ?Sexually transmitted infections (STIs) ?Get screened for STIs, including gonorrhea and chlamydia, if: ?You are sexually active and are younger than 75 years of age. ?You are older than 75 years of age and your health care provider tells you that you are at risk for this type of infection. ?Your sexual activity has changed since you were last  screened, and you are at increased risk for chlamydia or gonorrhea. Ask your health care provider if you are at risk. ?Ask your health care provider about whether you are at high risk for HIV. Your health care provider may recommend a prescription medicine to help prevent HIV infection. If you choose to take medicine to prevent HIV, you should first get tested for HIV. You should then be  tested every 3 months for as long as you are taking the medicine. ?Pregnancy ?If you are about to stop having your period (premenopausal) and you may become pregnant, seek counseling before you get pregnant. ?Take 400 to 800 micrograms (mcg) of folic acid every day if you become pregnant. ?Ask for birth control (contraception) if you want to prevent pregnancy. ?Osteoporosis and menopause ?Osteoporosis is a disease in which the bones lose minerals and strength with aging. This can result in bone fractures. If you are 47 years old or older, or if you are at risk for osteoporosis and fractures, ask your health care provider if you should: ?Be screened for bone loss. ?Take a calcium or vitamin D supplement to lower your risk of fractures. ?Be given hormone replacement therapy (HRT) to treat symptoms of menopause. ?Follow these instructions at home: ?Alcohol use ?Do not drink alcohol if: ?Your health care provider tells you not to drink. ?You are pregnant, may be pregnant, or are planning to become pregnant. ?If you drink alcohol: ?Limit how much you have to: ?0-1 drink a day. ?Know how much alcohol is in your drink. In the [place] one drink equals one 12 oz bottle of beer (355 mL), one 5 oz glass of wine (148 mL), or one 1? oz glass of hard liquor (44 mL). ?Lifestyle ?Do not use any products that contain nicotine or tobacco. These products include cigarettes, chewing tobacco, and vaping devices, such as e-cigarettes. If you need help quitting, ask your health care provider. ?Do not use street drugs. ?Do not share needles. ?Ask your health care provider for help if you need support or information about quitting drugs. ?General instructions ?Schedule regular health, dental, and eye exams. ?Stay current with your vaccines. ?Tell your health care provider if: ?You often feel depressed. ?You have ever been abused or do not feel safe at home. ?Summary ?Adopting a healthy lifestyle and getting preventive care are important in  promoting health and wellness. ?Follow your health care provider's instructions about healthy diet, exercising, and getting tested or screened for diseases. ?Follow your health care provider's instructions on monitoring your cholesterol and blood pressure. ?This information is not intended to replace advice given to you by your health care provider. Make sure you discuss any questions you have with your health care provider. ?Document Revised: 09/22/2020 Document Reviewed: 09/22/2020 ?Elsevier Patient Education ? [place]. ? ?

## 2021-08-19 NOTE — Progress Notes (Signed)
? ? ?Subjective:  ? ? Patient ID: Sarah Hendricks, female    DOB: 06-06-1945, 76 y.o.   MRN: [id number] ? ? ?This visit occurred during the SARS-CoV-2 public health emergency.  Safety protocols were in place, including screening questions prior to the visit, additional usage of staff PPE, and extensive cleaning of exam room while observing appropriate contact time as indicated for disinfecting solutions. ? ? ? ?HPI ?Sarah Hendricks is here for  ?Chief Complaint  ?Patient presents with  ? Annual Exam  ? ? ? ?She denies any major changes in her health.  She has no concerns. ? ? ?Was having some trouble sleeping - started melatonin and it has helped.   ? ? ?She is not exercising. ? ? ?Medications and allergies reviewed with patient and updated if appropriate. ? ? ?Current Outpatient Medications on File Prior to Visit  ?Medication Sig Dispense Refill  ? aspirin 81 MG tablet Take 81 mg by mouth daily.    ? calcium carbonate (OS-CAL) 600 MG TABS tablet Take 600 mg by mouth 2 (two) times daily with a meal.     ? Cholecalciferol (EQL VITAMIN D3) 1000 units tablet Take 1 tablet (1,000 Units total) by mouth daily.    ? escitalopram (LEXAPRO) 20 MG tablet TAKE 1 TABLET(20 MG) BY MOUTH DAILY 90 tablet 1  ? hydrochlorothiazide (HYDRODIURIL) 25 MG tablet Take 1 tablet (25 mg total) by mouth daily. 90 tablet 1  ? losartan (COZAAR) 100 MG tablet TAKE 1 TABLET(100 MG) BY MOUTH DAILY 90 tablet 3  ? Omega-3 Fatty Acids (FISH OIL) 1200 MG CAPS Take by mouth daily.    ? propranolol (INDERAL) 80 MG tablet Take 1 tablet (80 mg total) by mouth 2 (two) times daily. 180 tablet 3  ? rosuvastatin (CRESTOR) 10 MG tablet Take 1 tablet (10 mg total) by mouth daily. 90 tablet 3  ? ?No current facility-administered medications on file prior to visit.  ? ? ?Review of Systems  ?Constitutional:  Negative for chills and fever.  ?HENT:  Negative for hearing loss.   ?Eyes:  Negative for visual disturbance.  ?Respiratory:  Negative for cough, shortness of breath  and wheezing.   ?Cardiovascular:  Negative for chest pain, palpitations and leg swelling.  ?Gastrointestinal:  Negative for abdominal pain, blood in stool, constipation, diarrhea and nausea.  ?Genitourinary:  Negative for dysuria.  ?Skin:  Negative for rash.  ?Neurological:  Negative for light-headedness and headaches.  ?Psychiatric/Behavioral:  Positive for dysphoric mood. The patient is nervous/anxious.   ? ?   ?Objective:  ? ?Vitals:  ? 08/20/21 0828  ?BP: 134/76  ?Pulse: 80  ?Temp: 98 ?F (36.7 ?C)  ?SpO2: 95%  ? ?Filed Weights  ? 08/20/21 0828  ?Weight: 146 lb (66.2 kg)  ? ?Body mass index is 25.86 kg/m?. ? ?BP Readings from Last 3 Encounters:  ?08/20/21 134/76  ?04/30/21 140/80  ?09/09/20 (!) 142/57  ? ? ?Wt Readings from Last 3 Encounters:  ?08/20/21 146 lb (66.2 kg)  ?04/30/21 147 lb 6.4 oz (66.9 kg)  ?09/09/20 150 lb (68 kg)  ? ? ? ?  12/23/2020  ?  9:07 AM 09/29/2020  ?  8:30 AM 08/18/2020  ?  8:23 AM 06/18/2019  ?  9:08 AM 06/12/2018  ?  9:07 AM  ?Depression screen PHQ 2/9  ?Decreased Interest 0 0 0 0 0  ?Down, Depressed, Hopeless 0 0 0 0 0  ?PHQ - 2 Score 0 0 0 0 0  ?Altered sleeping 0 0 0    ?  Tired, decreased energy 0 0 0    ?Change in appetite 0 0 0    ?Feeling bad or failure about yourself  0 0 0    ?Trouble concentrating 0 0 0    ?Moving slowly or fidgety/restless 0 0 0    ?Suicidal thoughts 0 0 0    ?PHQ-9 Score 0 0 0    ? ? ? ? ?  08/18/2020  ?  8:24 AM  ?GAD 7 : Generalized Anxiety Score  ?Nervous, Anxious, on Edge 1  ?Control/stop worrying 0  ?Worry too much - different things 0  ?Trouble relaxing 0  ?Restless 0  ?Easily annoyed or irritable 0  ?Afraid - awful might happen 0  ?Total GAD 7 Score 1  ?Anxiety Difficulty Not difficult at all  ? ? ? ? ?  ?Physical Exam ?Constitutional: She appears well-developed and well-nourished. No distress.  ?HENT:  ?Head: Normocephalic and atraumatic.  ?Right Ear: External ear normal. Normal ear canal and TM ?Left Ear: External ear normal.  Normal ear canal and  TM ?Mouth/Throat: Oropharynx is clear and moist.  ?Eyes: Conjunctivae and EOM are normal.  ?Neck: Neck supple. No tracheal deviation present. No thyromegaly present.  ?No carotid bruit  ?Cardiovascular: Normal rate, regular rhythm and normal heart sounds.   ?No murmur heard.  No edema. ?Pulmonary/Chest: Effort normal and breath sounds normal. No respiratory distress. She has no wheezes. She has no rales.  ?Breast: deferred   ?Abdominal: Soft. She exhibits no distension. There is no tenderness.  ?Lymphadenopathy: She has no cervical adenopathy.  ?Skin: Skin is warm and dry. She is not diaphoretic.  ?Psychiatric: She has a normal mood and affect. Her behavior is normal.  ? ? ? ?Lab Results  ?Component Value Date  ? WBC 5.8 08/18/2020  ? HGB 13.5 08/18/2020  ? HCT 38.3 08/18/2020  ? PLT 256.0 08/18/2020  ? GLUCOSE 87 05/07/2021  ? CHOL 125 08/18/2020  ? TRIG 103.0 08/18/2020  ? HDL 48.30 08/18/2020  ? LDLDIRECT 87.0 06/12/2018  ? [place] 56 08/18/2020  ? ALT 13 08/18/2020  ? AST 17 08/18/2020  ? NA 134 05/07/2021  ? K 4.5 05/07/2021  ? CL 96 05/07/2021  ? CREATININE 0.88 05/07/2021  ? BUN 9 05/07/2021  ? CO2 25 05/07/2021  ? TSH 1.52 06/18/2019  ? HGBA1C 5.8 08/18/2020  ? ? ? ? ?   ?Assessment & Plan:  ? ?Physical exam: ?Screening blood work  ordered ?Exercise  none ?Weight  is good ?Substance abuse  none ? ? ?Reviewed recommended immunizations. ? ? ?Health Maintenance  ?Topic Date Due  ? COVID-19 Vaccine (4 - Booster for Moderna series) 07/17/2020  ? DEXA SCAN  06/26/2021  ? Zoster Vaccines- Shingrix (1 of 2) 09/20/2021 (Originally 02/17/1965)  ? INFLUENZA VACCINE  12/15/2021  ? MAMMOGRAM  11/21/2022  ? TETANUS/TDAP  02/14/2028  ? COLONOSCOPY (Pts 45-3yr Insurance coverage will need to be confirmed)  09/10/2030  ? Pneumonia Vaccine 75 Years old  Completed  ? Hepatitis C Screening  Completed  ? HPV VACCINES  Aged Out  ? COLON CANCER SCREENING ANNUAL FOBT  Discontinued  ?  ? ? ? ? ? ? ?See Problem List for Assessment  and Plan of chronic medical problems. ? ? ? ? ?

## 2021-08-20 ENCOUNTER — Ambulatory Visit (INDEPENDENT_AMBULATORY_CARE_PROVIDER_SITE_OTHER): Payer: Medicare Other | Admitting: Internal Medicine

## 2021-08-20 ENCOUNTER — Encounter: Payer: Self-pay | Admitting: Internal Medicine

## 2021-08-20 VITALS — BP 134/76 | HR 80 | Temp 98.0°F | Ht 63.0 in | Wt 146.0 lb

## 2021-08-20 DIAGNOSIS — M85861 Other specified disorders of bone density and structure, right lower leg: Secondary | ICD-10-CM

## 2021-08-20 DIAGNOSIS — Z0000 Encounter for general adult medical examination without abnormal findings: Secondary | ICD-10-CM | POA: Diagnosis not present

## 2021-08-20 DIAGNOSIS — F4329 Adjustment disorder with other symptoms: Secondary | ICD-10-CM | POA: Diagnosis not present

## 2021-08-20 DIAGNOSIS — I2510 Atherosclerotic heart disease of native coronary artery without angina pectoris: Secondary | ICD-10-CM | POA: Diagnosis not present

## 2021-08-20 DIAGNOSIS — M85862 Other specified disorders of bone density and structure, left lower leg: Secondary | ICD-10-CM

## 2021-08-20 DIAGNOSIS — I10 Essential (primary) hypertension: Secondary | ICD-10-CM

## 2021-08-20 DIAGNOSIS — R7303 Prediabetes: Secondary | ICD-10-CM

## 2021-08-20 DIAGNOSIS — E559 Vitamin D deficiency, unspecified: Secondary | ICD-10-CM | POA: Diagnosis not present

## 2021-08-20 DIAGNOSIS — E7849 Other hyperlipidemia: Secondary | ICD-10-CM

## 2021-08-20 LAB — CBC WITH DIFFERENTIAL/PLATELET
Basophils Absolute: 0.1 10*3/uL (ref 0.0–0.1)
Basophils Relative: 1.3 % (ref 0.0–3.0)
Eosinophils Absolute: 0.3 10*3/uL (ref 0.0–0.7)
Eosinophils Relative: 5.5 % — ABNORMAL HIGH (ref 0.0–5.0)
HCT: 36.4 % (ref 36.0–46.0)
Hemoglobin: 12.6 g/dL (ref 12.0–15.0)
Lymphocytes Relative: 23.6 % (ref 12.0–46.0)
Lymphs Abs: 1.5 10*3/uL (ref 0.7–4.0)
MCHC: 34.5 g/dL (ref 30.0–36.0)
MCV: 91 fl (ref 78.0–100.0)
Monocytes Absolute: 0.7 10*3/uL (ref 0.1–1.0)
Monocytes Relative: 11.1 % (ref 3.0–12.0)
Neutro Abs: 3.7 10*3/uL (ref 1.4–7.7)
Neutrophils Relative %: 58.5 % (ref 43.0–77.0)
Platelets: 244 10*3/uL (ref 150.0–400.0)
RBC: 4 Mil/uL (ref 3.87–5.11)
RDW: 13.9 % (ref 11.5–15.5)
WBC: 6.3 10*3/uL (ref 4.0–10.5)

## 2021-08-20 LAB — COMPREHENSIVE METABOLIC PANEL
ALT: 15 U/L (ref 0–35)
AST: 22 U/L (ref 0–37)
Albumin: 4.2 g/dL (ref 3.5–5.2)
Alkaline Phosphatase: 71 U/L (ref 39–117)
BUN: 10 mg/dL (ref 6–23)
CO2: 28 mEq/L (ref 19–32)
Calcium: 9.4 mg/dL (ref 8.4–10.5)
Chloride: 90 mEq/L — ABNORMAL LOW (ref 96–112)
Creatinine, Ser: 0.87 mg/dL (ref 0.40–1.20)
GFR: 65.59 mL/min (ref >60.00)
Glucose, Bld: 101 mg/dL — ABNORMAL HIGH (ref 70–99)
Potassium: 4 mEq/L (ref 3.5–5.1)
Sodium: 126 mEq/L — ABNORMAL LOW (ref 135–145)
Total Bilirubin: 0.6 mg/dL (ref 0.2–1.2)
Total Protein: 6.8 g/dL (ref 6.0–8.3)

## 2021-08-20 LAB — LIPID PANEL
Cholesterol: 127 mg/dL (ref 0–200)
HDL: 49 mg/dL (ref >39.00)
LDL Cholesterol: 52 mg/dL (ref 0–99)
NonHDL: 78.47
Total CHOL/HDL Ratio: 3
Triglycerides: 133 mg/dL (ref 0.0–149.0)
VLDL: 26.6 mg/dL (ref 0.0–40.0)

## 2021-08-20 LAB — TSH: TSH: 1.81 u[IU]/mL (ref 0.35–5.50)

## 2021-08-20 LAB — HEMOGLOBIN A1C: Hgb A1c MFr Bld: 5.9 % (ref 4.6–6.5)

## 2021-08-20 LAB — VITAMIN D 25 HYDROXY (VIT D DEFICIENCY, FRACTURES): VITD: 45.7 ng/mL (ref 30.00–100.00)

## 2021-08-20 NOTE — Assessment & Plan Note (Signed)
Chronic Check a1c Low sugar / carb diet Stressed regular exercise  

## 2021-08-20 NOTE — Assessment & Plan Note (Signed)
Chronic Taking vitamin D daily Check vitamin D level  

## 2021-08-20 NOTE — Assessment & Plan Note (Signed)
Chronic ?Blood pressure well controlled ?CMP ?Continue propranolol 80 mg twice daily mg twice daily, HCTZ 25 mg daily, losartan 100 mg daily ?

## 2021-08-20 NOTE — Assessment & Plan Note (Addendum)
Chronic ?No symptoms consistent with angina ?Continue aspirin 81 mg daily, propranolol 80 mg twice daily times daily ?Encouraged regular exercise, healthy diet ?

## 2021-08-20 NOTE — Assessment & Plan Note (Signed)
Chronic Regular exercise and healthy diet encouraged Check lipid panel  Continue Crestor 10 mg daily 

## 2021-08-20 NOTE — Assessment & Plan Note (Signed)
Chronic ?DEXA due-ordered ?Continue calcium and vitamin D daily ?Stressed the importance of regular exercise ?Check vitamin D level ?Discussed medication for osteoporosis ?

## 2021-08-20 NOTE — Assessment & Plan Note (Signed)
Chronic ?Controlled, Stable ?Continue Lexapro 20 mg daily ?

## 2021-08-25 ENCOUNTER — Telehealth: Payer: Self-pay | Admitting: Internal Medicine

## 2021-08-25 DIAGNOSIS — E871 Hypo-osmolality and hyponatremia: Secondary | ICD-10-CM

## 2021-08-25 NOTE — Telephone Encounter (Signed)
Spoke with patient and info given 

## 2021-08-25 NOTE — Telephone Encounter (Signed)
Please call her regarding her blood work results. ? ?Her kidney function, liver tests, blood counts, thyroid function are normal.  Her cholesterol is very good.  Vitamin D level is good.  Sugars in prediabetic range. ? ?Her sodium level is slightly low, which may be related to the hydrochlorothiazide.  I would like her to decrease the hydrochlorothiazide to half or 12.5 mg daily.  I would like her to come back in a week or 2  to recheck her sodium level-blood work ordered.  She also needs to monitor her blood pressure make sure it still well controlled.  If its not you may need to adjust one of her other medications. ? ? ?Fasting not required for blood work. ?

## 2021-08-27 ENCOUNTER — Encounter: Payer: Self-pay | Admitting: Internal Medicine

## 2021-08-27 ENCOUNTER — Ambulatory Visit (INDEPENDENT_AMBULATORY_CARE_PROVIDER_SITE_OTHER)
Admission: RE | Admit: 2021-08-27 | Discharge: 2021-08-27 | Disposition: A | Discharge status: Home / Self Care | Payer: Medicare Other | Source: Ambulatory Visit

## 2021-08-27 DIAGNOSIS — M85861 Other specified disorders of bone density and structure, right lower leg: Secondary | ICD-10-CM | POA: Diagnosis not present

## 2021-08-27 DIAGNOSIS — M85862 Other specified disorders of bone density and structure, left lower leg: Secondary | ICD-10-CM

## 2021-08-27 NOTE — Telephone Encounter (Signed)
Pt requesting a cb regarding lab results ?

## 2021-08-28 NOTE — Telephone Encounter (Signed)
Spoke with patient today. 

## 2021-09-09 ENCOUNTER — Encounter: Payer: Self-pay | Admitting: Internal Medicine

## 2021-09-09 NOTE — Patient Instructions (Addendum)
? ? ? ?Medications changes include :   vitamin K 45 mg / day and magnesium ? ? ?Try increasing your exercise.   ? ? ?Read the information about prolia.   ? ? ? ? ? ? ?Osteoporosis ? ?Osteoporosis happens when the bones become thin and less dense than normal. Osteoporosis makes bones more brittle and fragile and more likely to break (fracture). ?Over time, osteoporosis can cause your bones to become so weak that they fracture after a minor fall. Bones in the hip, wrist, and spine are most likely to fracture due to osteoporosis. ?What are the causes? ?The exact cause of this condition is not known. ?What increases the risk? ?You are more likely to develop this condition if you: ?Have family members with this condition. ?Have poor nutrition. ?Use the following: ?Steroid medicines, such as prednisone. ?Anti-seizure medicines. ?Nicotine or tobacco, such as cigarettes, e-cigarettes, and chewing tobacco. ?Are female. ?Are age 17 or older. ?Are not physically active (are sedentary). ?Are of European or Asian descent. ?Have a small body frame. ?What are the signs or symptoms? ?A fracture might be the first sign of osteoporosis, especially if the fracture results from a fall or injury that usually would not cause a bone to break. Other signs and symptoms include: ?Pain in the neck or low back. ?Stooped posture. ?Loss of height. ?How is this diagnosed? ?This condition may be diagnosed based on: ?Your medical history. ?A physical exam. ?A bone mineral density test, also called a DXA or DEXA test (dual-energy X-ray absorptiometry test). This test uses X-rays to measure the amount of minerals in your bones. ?How is this treated? ?This condition may be treated by: ?Making lifestyle changes, such as: ?Including foods with more calcium and vitamin D in your diet. ?Doing weight-bearing and muscle-strengthening exercises. ?Stopping tobacco use. ?Limiting alcohol intake. ?Taking medicine to slow the process of bone loss or to increase  bone density. ?Taking daily supplements of calcium and vitamin D. ?Taking hormone replacement medicines, such as estrogen for women and testosterone for men. ?Monitoring your levels of calcium and vitamin D. ?The goal of treatment is to strengthen your bones and lower your risk for a fracture. ?Follow these instructions at home: ?Eating and drinking ?Include calcium and vitamin D in your diet. Calcium is important for bone health, and vitamin D helps your body absorb calcium. Good sources of calcium and vitamin D include: ?Certain fatty fish, such as salmon and tuna. ?Products that have calcium and vitamin D added to them (are fortified), such as fortified cereals. ?Egg yolks. ?Cheese. ?Liver. ? ?Activity ?Do exercises as told by your health care provider. Ask your health care provider what exercises and activities are safe for you. You should do: ?Exercises that make you work against gravity (weight-bearing exercises), such as tai chi, yoga, or walking. ?Exercises to strengthen muscles, such as lifting weights. ?Lifestyle ?Do not drink alcohol if: ?Your health care provider tells you not to drink. ?You are pregnant, may be pregnant, or are planning to become pregnant. ?If you drink alcohol: ?Limit how much you use to: ?0-1 drink a day for women. ?0-2 drinks a day for men. ?Know how much alcohol is in your drink. In the [place] one drink equals one 12 oz bottle of beer (355 mL), one 5 oz glass of wine (148 mL), or one 1? oz glass of hard liquor (44 mL). ?Do not use any products that contain nicotine or tobacco, such as cigarettes, e-cigarettes, and chewing tobacco. If you need  help quitting, ask your health care provider. ?Preventing falls ?Use devices to help you move around (mobility aids) as needed, such as canes, walkers, scooters, or crutches. ?Keep rooms well-lit and clutter-free. ?Remove tripping hazards from walkways, including cords and throw rugs. ?Install grab bars in bathrooms and safety rails on  stairs. ?Use rubber mats in the bathroom and other areas that are often wet or slippery. ?Wear closed-toe shoes that fit well and support your feet. Wear shoes that have rubber soles or low heels. ?Review your medicines with your health care provider. Some medicines can cause dizziness or changes in blood pressure, which can increase your risk of falling. ?General instructions ?Take over-the-counter and prescription medicines only as told by your health care provider. ?Keep all follow-up visits. This is important. ?Contact a health care provider if: ?You have never been screened for osteoporosis and you are: ?A woman who is age 26 or older. ?A man who is age 51 or older. ?Get help right away if: ?You fall or injure yourself. ?Summary ?Osteoporosis is thinning and loss of density in your bones. This makes bones more brittle and fragile and more likely to break (fracture),even with minor falls. ?The goal of treatment is to strengthen your bones and lower your risk for a fracture. ?Include calcium and vitamin D in your diet. Calcium is important for bone health, and vitamin D helps your body absorb calcium. ?Talk with your health care provider about screening for osteoporosis if you are a woman who is age 50 or older, or a man who is age 4 or older. ?This information is not intended to replace advice given to you by your health care provider. Make sure you discuss any questions you have with your health care provider. ?Document Revised: 10/18/2019 Document Reviewed: 10/18/2019 ?Elsevier Patient Education ? [place]. ? ?

## 2021-09-09 NOTE — Progress Notes (Signed)
? ? ? ? ?  Subjective:  ? ? Patient ID: Sarah Hendricks, female    DOB: [birth year], 76 y.o.   MRN: [id number] ? ?This visit occurred during the SARS-CoV-2 public health emergency.  Safety protocols were in place, including screening questions prior to the visit, additional usage of staff PPE, and extensive cleaning of exam room while observing appropriate contact time as indicated for disinfecting solutions.   ? ? ?HPI ?Sarah Hendricks is here for follow up of her chronic medical problems, including osteoporosis.  ? ? ?08/27/21 ?Results: ?  Lumbar spine L1-L4 Femoral neck (FN)  ?T-score   -1.2 RFN: -2.5 ?LFN: -2.0  ?Change in BMD from previous DXA test (%) Up 4.5%* Down 1.2%  ?(*) statistically significant ? ? ?Last vitamin D ?Lab Results  ?Component Value Date  ? VD25OH 45.70 08/20/2021  ? ?She did tolerate fosamax, evista - bone pain.   ? ? ? ? ?Medications and allergies reviewed with patient and updated if appropriate. ? ?Current Outpatient Medications on File Prior to Visit  ?Medication Sig Dispense Refill  ? aspirin 81 MG tablet Take 81 mg by mouth daily.    ? calcium carbonate (OS-CAL) 600 MG TABS tablet Take 600 mg by mouth 2 (two) times daily with a meal.     ? Cholecalciferol (EQL VITAMIN D3) 1000 units tablet Take 1 tablet (1,000 Units total) by mouth daily.    ? escitalopram (LEXAPRO) 20 MG tablet TAKE 1 TABLET(20 MG) BY MOUTH DAILY 90 tablet 1  ? hydrochlorothiazide (HYDRODIURIL) 25 MG tablet Take 1 tablet (25 mg total) by mouth daily. 90 tablet 1  ? losartan (COZAAR) 100 MG tablet TAKE 1 TABLET(100 MG) BY MOUTH DAILY 90 tablet 3  ? Omega-3 Fatty Acids (FISH OIL) 1200 MG CAPS Take by mouth daily.    ? propranolol (INDERAL) 80 MG tablet Take 1 tablet (80 mg total) by mouth 2 (two) times daily. 180 tablet 3  ? rosuvastatin (CRESTOR) 10 MG tablet Take 1 tablet (10 mg total) by mouth daily. 90 tablet 3  ? ?No current facility-administered medications on file prior to visit.  ? ? ? ?Review of Systems ? ?   ?Objective:   ?There were no vitals filed for this visit. ?BP Readings from Last 3 Encounters:  ?08/20/21 134/76  ?04/30/21 140/80  ?09/09/20 (!) 142/57  ? ?Wt Readings from Last 3 Encounters:  ?08/20/21 146 lb (66.2 kg)  ?04/30/21 147 lb 6.4 oz (66.9 kg)  ?09/09/20 150 lb (68 kg)  ? ?There is no height or weight on file to calculate BMI. ? ?  ?Physical Exam ?   ? ?Lab Results  ?Component Value Date  ? WBC 6.3 08/20/2021  ? HGB 12.6 08/20/2021  ? HCT 36.4 08/20/2021  ? PLT 244.0 08/20/2021  ? GLUCOSE 101 (H) 08/20/2021  ? CHOL 127 08/20/2021  ? TRIG 133.0 08/20/2021  ? HDL 49.00 08/20/2021  ? LDLDIRECT 87.0 06/12/2018  ? [place] 52 08/20/2021  ? ALT 15 08/20/2021  ? AST 22 08/20/2021  ? NA 126 (L) 08/20/2021  ? K 4.0 08/20/2021  ? CL 90 (L) 08/20/2021  ? CREATININE 0.87 08/20/2021  ? BUN 10 08/20/2021  ? CO2 28 08/20/2021  ? TSH 1.81 08/20/2021  ? HGBA1C 5.9 08/20/2021  ? ? ? ?Assessment & Plan:  ? ? ?See Problem List for Assessment and Plan of chronic medical problems.  ? ? ?

## 2021-09-10 ENCOUNTER — Other Ambulatory Visit (INDEPENDENT_AMBULATORY_CARE_PROVIDER_SITE_OTHER): Payer: Medicare Other

## 2021-09-10 ENCOUNTER — Ambulatory Visit (INDEPENDENT_AMBULATORY_CARE_PROVIDER_SITE_OTHER): Payer: Medicare Other | Admitting: Internal Medicine

## 2021-09-10 VITALS — BP 128/80 | HR 60 | Temp 97.9°F | Ht 63.0 in | Wt 145.0 lb

## 2021-09-10 DIAGNOSIS — E871 Hypo-osmolality and hyponatremia: Secondary | ICD-10-CM | POA: Diagnosis not present

## 2021-09-10 DIAGNOSIS — I10 Essential (primary) hypertension: Secondary | ICD-10-CM

## 2021-09-10 DIAGNOSIS — M810 Age-related osteoporosis without current pathological fracture: Secondary | ICD-10-CM

## 2021-09-10 LAB — BASIC METABOLIC PANEL
BUN: 14 mg/dL (ref 6–23)
CO2: 28 mEq/L (ref 19–32)
Calcium: 9.1 mg/dL (ref 8.4–10.5)
Chloride: 92 mEq/L — ABNORMAL LOW (ref 96–112)
Creatinine, Ser: 0.9 mg/dL (ref 0.40–1.20)
GFR: 62.95 mL/min (ref >60.00)
Glucose, Bld: 113 mg/dL — ABNORMAL HIGH (ref 70–99)
Potassium: 3.9 mEq/L (ref 3.5–5.1)
Sodium: 127 mEq/L — ABNORMAL LOW (ref 135–145)

## 2021-09-10 MED ORDER — HYDROCHLOROTHIAZIDE 12.5 MG PO TABS: 12.5000 mg | ORAL_TABLET | Freq: Every day | ORAL | 3 refills | Status: DC

## 2021-09-10 NOTE — Assessment & Plan Note (Signed)
Chronic ?Blood pressure is well controlled ?I did have her decrease her hydrochlorothiazide to half a pill and she is tolerating it well and blood pressure is remaining well controlled ?New prescription for hydrochlorothiazide 12.5 mg daily sent ?Continue losartan 100 mg daily, propranolol 80 mg twice daily ?

## 2021-09-10 NOTE — Assessment & Plan Note (Signed)
New ?Reviewed bone density ?Continue calcium and vitamin D daily-vitamin D level was good earlier this month ?Discussed that she can start taking a magnesium vitamin K 45 mg daily to help with bone strength ?Encouraged increasing exercise, consider light weights, regular walking ?Did not tolerate Fosamax and Evista in the past secondary to bone pain ?Discussed Prolia, Evenity and Forteo ?Discussed possible side effects and information given about Prolia ?At this point she would like to try conservative measures and recheck bone density in 2 years, which I think is reasonable ?Discussed fall prevention ?

## 2021-09-14 DIAGNOSIS — S63502A Unspecified sprain of left wrist, initial encounter: Secondary | ICD-10-CM | POA: Diagnosis not present

## 2021-09-28 ENCOUNTER — Telehealth: Payer: Self-pay | Admitting: Internal Medicine

## 2021-09-28 NOTE — Telephone Encounter (Signed)
LVM for pt to rtn my call to schedule AWV with [facility]. Please schedule if pt calls the office.  ?

## 2021-10-01 ENCOUNTER — Ambulatory Visit (INDEPENDENT_AMBULATORY_CARE_PROVIDER_SITE_OTHER): Payer: Medicare Other

## 2021-10-01 DIAGNOSIS — Z0000 Encounter for general adult medical examination without abnormal findings: Secondary | ICD-10-CM

## 2021-10-01 NOTE — Patient Instructions (Signed)
Sarah Hendricks , Thank you for taking time to come for your Medicare Wellness Visit. I appreciate your ongoing commitment to your health goals. Please review the following plan we discussed and let me know if I can assist you in the future.   Screening recommendations/referrals: Colonoscopy: 09/09/2020; due every 10 years Mammogram: 11/20/2020; due every year Bone Density: 08/27/2021; due every 2-3 years Recommended yearly ophthalmology/optometry visit for glaucoma screening and checkup Recommended yearly dental visit for hygiene and checkup  Vaccinations: Influenza vaccine: 02/09/2021 Pneumococcal vaccine: 09/26/2013, 05/30/2014 Tdap vaccine: 02/13/2018; due every 10 years Shingles vaccine: never done   Covid-19: 07/13/2019, 08/10/2019, 05/22/2020  Advanced directives: Yes; Please bring a copy of your health care power of attorney and living will to the office at your convenience.  Conditions/risks identified: Yes  Next appointment: Please schedule your next Medicare Wellness Visit with your Nurse Health Advisor in 1 year by calling [phone].   Preventive Care 13 Years and Older, Female Preventive care refers to lifestyle choices and visits with your health care provider that can promote health and wellness. What does preventive care include? A yearly physical exam. This is also called an annual well check. Dental exams once or twice a year. Routine eye exams. Ask your health care provider how often you should have your eyes checked. Personal lifestyle choices, including: Daily care of your teeth and gums. Regular physical activity. Eating a healthy diet. Avoiding tobacco and drug use. Limiting alcohol use. Practicing safe sex. Taking low-dose aspirin every day. Taking vitamin and mineral supplements as recommended by your health care provider. What happens during an annual well check? The services and screenings done by your health care provider during your annual well check will  depend on your age, overall health, lifestyle risk factors, and family history of disease. Counseling  Your health care provider may ask you questions about your: Alcohol use. Tobacco use. Drug use. Emotional well-being. Home and relationship well-being. Sexual activity. Eating habits. History of falls. Memory and ability to understand (cognition). Work and work Statistician. Reproductive health. Screening  You may have the following tests or measurements: Height, weight, and BMI. Blood pressure. Lipid and cholesterol levels. These may be checked every 5 years, or more frequently if you are over 7 years old. Skin check. Lung cancer screening. You may have this screening every year starting at age 34 if you have a 30-pack-year history of smoking and currently smoke or have quit within the past 15 years. Fecal occult blood test (FOBT) of the stool. You may have this test every year starting at age 84. Flexible sigmoidoscopy or colonoscopy. You may have a sigmoidoscopy every 5 years or a colonoscopy every 10 years starting at age 87. Hepatitis C blood test. Hepatitis B blood test. Sexually transmitted disease (STD) testing. Diabetes screening. This is done by checking your blood sugar (glucose) after you have not eaten for a while (fasting). You may have this done every 1-3 years. Bone density scan. This is done to screen for osteoporosis. You may have this done starting at age 36. Mammogram. This may be done every 1-2 years. Talk to your health care provider about how often you should have regular mammograms. Talk with your health care provider about your test results, treatment options, and if necessary, the need for more tests. Vaccines  Your health care provider may recommend certain vaccines, such as: Influenza vaccine. This is recommended every year. Tetanus, diphtheria, and acellular pertussis (Tdap, Td) vaccine. You may need a Td booster  every 10 years. Zoster vaccine. You may  need this after age 19. Pneumococcal 13-valent conjugate (PCV13) vaccine. One dose is recommended after age 76. Pneumococcal polysaccharide (PPSV23) vaccine. One dose is recommended after age 66. Talk to your health care provider about which screenings and vaccines you need and how often you need them. This information is not intended to replace advice given to you by your health care provider. Make sure you discuss any questions you have with your health care provider. Document Released: 05/30/2015 Document Revised: 01/21/2016 Document Reviewed: 03/04/2015 Elsevier Interactive Patient Education  2017 [place] Prevention in the Home Falls can cause injuries. They can happen to people of all ages. There are many things you can do to make your home safe and to help prevent falls. What can I do on the outside of my home? Regularly fix the edges of walkways and driveways and fix any cracks. Remove anything that might make you trip as you walk through a door, such as a raised step or threshold. Trim any bushes or trees on the path to your home. Use bright outdoor lighting. Clear any walking paths of anything that might make someone trip, such as rocks or tools. Regularly check to see if handrails are loose or broken. Make sure that both sides of any steps have handrails. Any raised decks and porches should have guardrails on the edges. Have any leaves, snow, or ice cleared regularly. Use sand or salt on walking paths during winter. Clean up any spills in your garage right away. This includes oil or grease spills. What can I do in the bathroom? Use night lights. Install grab bars by the toilet and in the tub and shower. Do not use towel bars as grab bars. Use non-skid mats or decals in the tub or shower. If you need to sit down in the shower, use a plastic, non-slip stool. Keep the floor dry. Clean up any water that spills on the floor as soon as it happens. Remove soap buildup in  the tub or shower regularly. Attach bath mats securely with double-sided non-slip rug tape. Do not have throw rugs and other things on the floor that can make you trip. What can I do in the bedroom? Use night lights. Make sure that you have a light by your bed that is easy to reach. Do not use any sheets or blankets that are too big for your bed. They should not hang down onto the floor. Have a firm chair that has side arms. You can use this for support while you get dressed. Do not have throw rugs and other things on the floor that can make you trip. What can I do in the kitchen? Clean up any spills right away. Avoid walking on wet floors. Keep items that you use a lot in easy-to-reach places. If you need to reach something above you, use a strong step stool that has a grab bar. Keep electrical cords out of the way. Do not use floor polish or wax that makes floors slippery. If you must use wax, use non-skid floor wax. Do not have throw rugs and other things on the floor that can make you trip. What can I do with my stairs? Do not leave any items on the stairs. Make sure that there are handrails on both sides of the stairs and use them. Fix handrails that are broken or loose. Make sure that handrails are as long as the stairways. Check any carpeting to  make sure that it is firmly attached to the stairs. Fix any carpet that is loose or worn. Avoid having throw rugs at the top or bottom of the stairs. If you do have throw rugs, attach them to the floor with carpet tape. Make sure that you have a light switch at the top of the stairs and the bottom of the stairs. If you do not have them, ask someone to add them for you. What else can I do to help prevent falls? Wear shoes that: Do not have high heels. Have rubber bottoms. Are comfortable and fit you well. Are closed at the toe. Do not wear sandals. If you use a stepladder: Make sure that it is fully opened. Do not climb a closed  stepladder. Make sure that both sides of the stepladder are locked into place. Ask someone to hold it for you, if possible. Clearly mark and make sure that you can see: Any grab bars or handrails. First and last steps. Where the edge of each step is. Use tools that help you move around (mobility aids) if they are needed. These include: Canes. Walkers. Scooters. Crutches. Turn on the lights when you go into a dark area. Replace any light bulbs as soon as they burn out. Set up your furniture so you have a clear path. Avoid moving your furniture around. If any of your floors are uneven, fix them. If there are any pets around you, be aware of where they are. Review your medicines with your doctor. Some medicines can make you feel dizzy. This can increase your chance of falling. Ask your doctor what other things that you can do to help prevent falls. This information is not intended to replace advice given to you by your health care provider. Make sure you discuss any questions you have with your health care provider. Document Released: 02/27/2009 Document Revised: 10/09/2015 Document Reviewed: 06/07/2014 Elsevier Interactive Patient Education  2017 [employer].

## 2021-10-01 NOTE — Progress Notes (Signed)
I connected with Sarah Hendricks today by telephone and verified that I am speaking with the correct person using two identifiers. Location patient: home Location provider: work Persons participating in the virtual visit: patient, provider.   I discussed the limitations, risks, security and privacy concerns of performing an evaluation and management service by telephone and the availability of in person appointments. I also discussed with the patient that there may be a patient responsible charge related to this service. The patient expressed understanding and verbally consented to this telephonic visit.    Interactive audio and video telecommunications were attempted between this provider and patient, however failed, due to patient having technical difficulties OR patient did not have access to video capability.  We continued and completed visit with audio only.  Some vital signs may be absent or patient reported.   Time Spent with patient on telephone encounter: 30 minutes  Subjective:   Sarah Hendricks is a 76 y.o. female who presents for Medicare Annual (Subsequent) preventive examination.  Review of Systems     Cardiac Risk Factors include: advanced age (>35mn, >>86women);dyslipidemia;family history of premature cardiovascular disease;hypertension     Objective:    There were no vitals filed for this visit. There is no height or weight on file to calculate BMI.     10/01/2021    8:56 AM 09/29/2020    8:24 AM  Advanced Directives  Does Patient Have a Medical Advance Directive? Yes No  Type of Advance Directive Living will   Does patient want to make changes to medical advance directive? No - Patient declined   Would patient like information on creating a medical advance directive?  No - Patient declined    Current Medications (verified) Outpatient Encounter Medications as of 10/01/2021  Medication Sig   aspirin 81 MG tablet Take 81 mg by mouth daily.   calcium carbonate  (OS-CAL) 600 MG TABS tablet Take 600 mg by mouth 2 (two) times daily with a meal.    Cholecalciferol (EQL VITAMIN D3) 1000 units tablet Take 1 tablet (1,000 Units total) by mouth daily.   escitalopram (LEXAPRO) 20 MG tablet TAKE 1 TABLET(20 MG) BY MOUTH DAILY   hydrochlorothiazide (HYDRODIURIL) 12.5 MG tablet Take 1 tablet (12.5 mg total) by mouth daily.   losartan (COZAAR) 100 MG tablet TAKE 1 TABLET(100 MG) BY MOUTH DAILY   Omega-3 Fatty Acids (FISH OIL) 1200 MG CAPS Take by mouth daily.   propranolol (INDERAL) 80 MG tablet Take 1 tablet (80 mg total) by mouth 2 (two) times daily.   rosuvastatin (CRESTOR) 10 MG tablet Take 1 tablet (10 mg total) by mouth daily.   vitamin k 100 MCG tablet Take 100 mcg by mouth. Take 1 capsule po every other day for bones   No facility-administered encounter medications on file as of 10/01/2021.    Allergies (verified) Acetaminophen-codeine, Tigan [trimethobenzamide], Alendronate, Atorvastatin, and Codeine   History: Past Medical History:  Diagnosis Date   Allergy    Arthritis    [place]   lumpectomy and XRT, no chemo   CAD (coronary artery disease) 2008   PTCA, in [place]   GERD (gastroesophageal reflux disease)    History of chicken pox    Hyperlipidemia    Hypertension    [place]   stent placed   Vitamin D deficiency    Past Surgical History:  Procedure Laterality Date   BREAST BIOPSY Left 1994   BREAST LUMPECTOMY Left 1994   CATARACT EXTRACTION,  BILATERAL     CORONARY ANGIOPLASTY WITH STENT PLACEMENT  2008   CRANIECTOMY FOR DEPRESSED SKULL FRACTURE  1986   TONSILLECTOMY  1966   Family History  Problem Relation Age of Onset   Breast cancer Mother    Hypertension Mother    Heart disease Mother    Stroke Mother    Arthritis Father    Heart disease Father        died at 33 of heart attack   Heart disease Brother    Stroke Maternal Grandmother    Heart disease Maternal Grandfather    Colon  cancer Neg Hx    Esophageal cancer Neg Hx    Rectal cancer Neg Hx    Stomach cancer Neg Hx    Social History   Socioeconomic History   Marital status: Married    Spouse name: Not on file   Number of children: Not on file   Years of education: Not on file   Highest education level: Not on file  Occupational History   Not on file  Tobacco Use   Smoking status: Never   Smokeless tobacco: Never  Vaping Use   Vaping Use: Never used  Substance and Sexual Activity   Alcohol use: No   Drug use: No   Sexual activity: Not on file  Other Topics Concern   Not on file  Social History Narrative   Married, lives with spouse   Retired from [employer] irregularly, rides an exercise bike   Social Determinants of Radio broadcast assistant Strain: Low Risk    Difficulty of Paying Living Expenses: Not hard at all  Food Insecurity: No Food Insecurity   Worried About Charity fundraiser in the Last Year: Never true   Arboriculturist in the Last Year: Never true  Transportation Needs: No Transportation Needs   Lack of Transportation (Medical): No   Lack of Transportation (Non-Medical): No  Physical Activity: Sufficiently Active   Days of Exercise per Week: 5 days   Minutes of Exercise per Session: 30 min  Stress: No Stress Concern Present   Feeling of Stress : Not at all  Social Connections: Socially Integrated   Frequency of Communication with Friends and Family: More than three times a week   Frequency of Social Gatherings with Friends and Family: Twice a week   Attends Religious Services: More than 4 times per year   Active Member of [employer] or Organizations: No   Attends Music therapist: More than 4 times per year   Marital Status: Married    Tobacco Counseling Counseling given: Not Answered   Clinical Intake:  Pre-visit preparation completed: Yes  Pain : No/denies pain     Nutritional Risks: None Diabetes: No  How often do you need to  have someone help you when you read instructions, pamphlets, or other written materials from your doctor or pharmacy?: 1 - Never What is the last grade level you completed in school?: HSG  Diabetic? no  Interpreter Needed?: No  Information entered by :: Lisette Abu, LPN.   Activities of Daily Living    10/01/2021    9:07 AM  In your present state of health, do you have any difficulty performing the following activities:  Hearing? 0  Vision? 0  Difficulty concentrating or making decisions? 0  Walking or climbing stairs? 0  Dressing or bathing? 0  Doing errands, shopping? 0  Preparing Food and eating ?  N  Using the Toilet? N  In the past six months, have you accidently leaked urine? N  Do you have problems with loss of bowel control? N  Managing your Medications? N  Managing your Finances? N  Housekeeping or managing your Housekeeping? N    Patient Care Team: Binnie Rail, MD as PCP - General (Internal Medicine) Dorothy Spark, MD as PCP - Cardiology (Cardiology) Alanda Slim Neena Rhymes, MD as Consulting Physician (Ophthalmology) Delice Bison Darnelle Maffucci, [facility] as Pharmacist (Pharmacist)  Indicate any recent Medical Services you may have received from other than [facility] providers in the past year (date may be approximate).     Assessment:   This is a routine wellness examination for Sarah Hendricks.  Hearing/Vision screen Hearing Screening - Comments:: Patient does not wear hearing aids. Vision Screening - Comments:: Patient wears reading glasses. Eye doctor: Dr. Julian Reil   Dietary issues and exercise activities discussed: Current Exercise Habits: Home exercise routine, Type of exercise: walking, Time (Minutes): 30, Frequency (Times/Week): 5, Weekly Exercise (Minutes/Week): 150, Intensity: Moderate, Exercise limited by: None identified   Goals Addressed             This Visit's Progress    To lose 10 pounds.        Depression Screen    10/01/2021    9:01 AM 12/23/2020     9:07 AM 09/29/2020    8:30 AM 08/18/2020    8:23 AM 06/18/2019    9:08 AM 06/12/2018    9:07 AM 05/25/2017    8:21 AM  PHQ 2/9 Scores  PHQ - 2 Score 0 0 0 0 0 0 0  PHQ- 9 Score  0 0 0       Fall Risk    10/01/2021    8:58 AM 09/29/2020    8:26 AM 08/18/2020    8:07 AM 06/18/2019    9:07 AM 06/12/2018    9:05 AM  Fall Risk   Falls in the past year? 0 0 0 0 0  Number falls in past yr: 0 0 0 0   Injury with Fall? 0 0 0    Risk for fall due to : No Fall Risks No Fall Risks No Fall Risks    Follow up Falls evaluation completed Falls evaluation completed Falls evaluation completed      [place]:  Any stairs in or around the home? Yes  If so, are there any without handrails? No  Home free of loose throw rugs in walkways, pet beds, electrical cords, etc? Yes  Adequate lighting in your home to reduce risk of falls? Yes   ASSISTIVE DEVICES UTILIZED TO PREVENT FALLS:  Life alert? No  Use of a cane, walker or w/c? No  Grab bars in the bathroom? Yes  Shower chair or bench in shower? No  Elevated toilet seat or a handicapped toilet? Yes   TIMED UP AND GO:  Was the test performed? No .  Length of time to ambulate 10 feet: n/a sec.   Appearance of gait: Patient not evaluated for gait during this visit.  Cognitive Function:        10/01/2021    9:08 AM  6CIT Screen  What Year? 0 points  What month? 0 points  What time? 0 points  Count back from 20 0 points  Months in reverse 0 points  Repeat phrase 0 points  Total Score 0 points    Immunizations Immunization History  Administered Date(s)  Administered   Fluad Quad(high Dose 65+) 02/15/2019, 02/28/2020, 02/09/2021   Influenza, High Dose Seasonal PF 02/15/2017, 02/13/2018   Influenza-Unspecified 03/17/2014, 02/16/2015, 02/29/2016   Moderna Sars-Covid-2 Vaccination 07/13/2019, 08/10/2019, 05/22/2020   Pneumococcal Conjugate-13 05/30/2014   Pneumococcal Polysaccharide-23 09/26/2013   Tdap  02/13/2018    TDAP status: Up to date  Flu Vaccine status: Up to date  Pneumococcal vaccine status: Up to date  Covid-19 vaccine status: Completed vaccines  Qualifies for Shingles Vaccine? Yes   Zostavax completed No   Shingrix Completed?: No.    Education has been provided regarding the importance of this vaccine. Patient has been advised to call insurance company to determine out of pocket expense if they have not yet received this vaccine. Advised may also receive vaccine at local pharmacy or Health Dept. Verbalized acceptance and understanding.  Screening Tests Health Maintenance  Topic Date Due   Zoster Vaccines- Shingrix (1 of 2) Never done   COVID-19 Vaccine (4 - Booster for Moderna series) 07/17/2020   INFLUENZA VACCINE  12/15/2021   MAMMOGRAM  11/21/2022   DEXA SCAN  08/28/2023   TETANUS/TDAP  02/14/2028   COLONOSCOPY (Pts 45-67yr Insurance coverage will need to be confirmed)  09/10/2030   Pneumonia Vaccine 75 Years old  Completed   Hepatitis C Screening  Completed   HPV VACCINES  Aged Out   COLON CANCER SCREENING ANNUAL FOBT  Discontinued    Health Maintenance  Health Maintenance Due  Topic Date Due   Zoster Vaccines- Shingrix (1 of 2) Never done   COVID-19 Vaccine (4 - Booster for Moderna series) 07/17/2020    Colorectal cancer screening: Type of screening: Colonoscopy. Completed 09/09/2020. Repeat every 10 years  Mammogram status: Completed 11/20/2020. Repeat every year  Bone Density status: Completed 08/27/2021. Results reflect: Bone density results: OSTEOPOROSIS. Repeat every 2-3 years.  Lung Cancer Screening: (Low Dose CT Chest recommended if Age 75-80years, 30 pack-year currently smoking OR have quit w/in 15years.) does not qualify.   Lung Cancer Screening Referral: no  Additional Screening:  Hepatitis C Screening: does qualify; Completed 05/21/2016  Vision Screening: Recommended annual ophthalmology exams for early detection of glaucoma and other  disorders of the eye. Is the patient up to date with their annual eye exam?  Yes  Who is the provider or what is the name of the office in which the patient attends annual eye exams? AJunie Spencer MD. If pt is not established with a provider, would they like to be referred to a provider to establish care? No .   Dental Screening: Recommended annual dental exams for proper oral hygiene  Community Resource Referral / Chronic Care Management: CRR required this visit?  No   CCM required this visit?  No      Plan:     I have personally reviewed and noted the following in the patient's chart:   Medical and social history Use of alcohol, tobacco or illicit drugs  Current medications and supplements including opioid prescriptions.  Functional ability and status Nutritional status Physical activity Advanced directives List of other physicians Hospitalizations, surgeries, and ER visits in previous 12 months Vitals Screenings to include cognitive, depression, and falls Referrals and appointments  In addition, I have reviewed and discussed with patient certain preventive protocols, quality metrics, and best practice recommendations. A written personalized care plan for preventive services as well as general preventive health recommendations were provided to patient.     SSheral Flow LPN   [id number]  Nurse Notes:  There were no vitals filed for this visit. There is no height or weight on file to calculate BMI. Patient stated that she has no issues with gait or balance; does not use any assistive devices. Medications reviewed with patient; no opioid use noted.

## 2021-10-02 ENCOUNTER — Encounter: Payer: Self-pay | Admitting: Internal Medicine

## 2021-10-08 ENCOUNTER — Other Ambulatory Visit: Payer: Self-pay | Admitting: Internal Medicine

## 2021-10-08 DIAGNOSIS — Z1231 Encounter for screening mammogram for malignant neoplasm of breast: Secondary | ICD-10-CM

## 2021-10-25 NOTE — Progress Notes (Deleted)
Cardiology Office Note:    Date:  10/25/2021   ID:  Sarah Hendricks, DOB 26-Jul-1945, MRN [id number]  PCP:  Binnie Rail, MD   [facility] Providers Cardiologist:  Ena Dawley, MD {     Referring MD: Binnie Rail, MD    History of Present Illness:    Sarah Hendricks is a 76 y.o. female with a hx of HTN, HLD, CAD s/p PCI in 2008, and breast cancer s/p lumpectomy and XRT (no chemo) who was previously followed by Dr. Meda Coffee who now returns to clinic for follow-up.   Last saw Dr. Meda Coffee in 2018. Underwent exercise stress test in 2018 which was negative for ischemia.  Was last seen in clinic on 04/2021 where she was struggling with tremors. She was otherwise doing well from a CV standpoint.   Today, ***    Past Medical History:  Diagnosis Date   Allergy    Arthritis    Breast cancer ([place]) 1994   lumpectomy and XRT, no chemo   CAD (coronary artery disease) 2008   [place]CA, in [place]   GERD (gastroesophageal reflux disease)    History of chicken pox    Hyperlipidemia    Hypertension    Myocardial infarction ([place]) 03/15/2007   stent placed   Vitamin D deficiency     Past Surgical History:  Procedure Laterality Date   BREAST BIOPSY Left 1994   BREAST LUMPECTOMY Left 1994   CATARACT EXTRACTION, BILATERAL     CORONARY ANGIOPLASTY WITH STENT PLACEMENT  2008   CRANIECTOMY FOR DEPRESSED SKULL FRACTURE  1986   TONSILLECTOMY  1966    Current Medications: No outpatient medications have been marked as taking for the 10/28/21 encounter (Appointment) with [name], MD.     Allergies:   Acetaminophen-codeine, Tigan [trimethobenzamide], Alendronate, Atorvastatin, and Codeine   Social History   Socioeconomic History   Marital status: Married    Spouse name: Not on file   Number of children: Not on file   Years of education: Not on file   Highest education level: Not on file  Occupational History   Not on file  Tobacco Use   Smoking status: Never    Smokeless tobacco: Never  Vaping Use   Vaping Use: Never used  Substance and Sexual Activity   Alcohol use: No   Drug use: No   Sexual activity: Not on file  Other Topics Concern   Not on file  Social History Narrative   Married, lives with spouse   Retired from [employer] irregularly, rides an exercise bike   Social Determinants of Health   Financial Resource Strain: [place]  (10/01/2021)   Overall Financial Resource Strain (CARDIA)    Difficulty of Paying Living Expenses: Not hard at all  Food Insecurity: No [place] (10/01/2021)   Hunger Vital Sign    Worried About Running Out of Food in the Last Year: Never true    [place] in the Last Year: Never true  Transportation Needs: No Transportation Needs (10/01/2021)   PRAPARE - Hydrologist (Medical): No    Lack of Transportation (Non-Medical): No  Physical Activity: Sufficiently Active (10/01/2021)   Exercise Vital Sign    Days of Exercise per Week: 5 days    Minutes of Exercise per Session: 30 min  Stress: No Stress Concern Present (10/01/2021)   [place]  Feeling of Stress : Not at all  Social Connections: Socially Integrated (10/01/2021)   Social Connection and Isolation Panel [NHANES]    Frequency of Communication with Friends and Family: More than three times a week    Frequency of Social Gatherings with Friends and Family: Twice a week    Attends Religious Services: More than 4 times per year    Active Member of [employer] or Organizations: No    Attends Music therapist: More than 4 times per year    Marital Status: Married     Family History: The patient's ***family history includes Arthritis in her father; Breast cancer in her mother; Heart disease in her brother, father, maternal grandfather, and mother; Hypertension in her mother; Stroke in her maternal grandmother and mother. There  is no history of Colon cancer, Esophageal cancer, Rectal cancer, or Stomach cancer.  ROS:   Please see the history of present illness.    *** All other systems reviewed and are negative.  EKGs/Labs/Other Studies Reviewed:    The following studies were reviewed today: ETT 06/16/16: The patient walked for 6:16 of a Bruce protocol. Peak HR of 141 which is 93% predicted maxmial HR. There were no ST or T wave changes to suggest ischemia. BP response to exercise was normal.  EKG:  EKG is *** ordered today.  The ekg ordered today demonstrates ***  Recent Labs: 08/20/2021: ALT 15; Hemoglobin 12.6; Platelets 244.0; TSH 1.81 09/10/2021: BUN 14; Creatinine, Ser 0.90; Potassium 3.9; Sodium 127  Recent Lipid Panel    Component Value Date/Time   CHOL 127 08/20/2021 0856   TRIG 133.0 08/20/2021 0856   HDL 49.00 08/20/2021 0856   CHOLHDL 3 08/20/2021 0856   VLDL 26.6 08/20/2021 0856   LDLCALC 52 08/20/2021 0856   LDLDIRECT 87.0 06/12/2018 0946     Risk Assessment/Calculations:   {Does this patient have ATRIAL FIBRILLATION?:[id number]}       Physical Exam:    VS:  There were no vitals taken for this visit.    Wt Readings from Last 3 Encounters:  09/10/21 145 lb (65.8 kg)  08/20/21 146 lb (66.2 kg)  04/30/21 147 lb 6.4 oz (66.9 kg)     GEN: *** Well nourished, well developed in no acute distress HEENT: Normal NECK: No JVD; No carotid bruits LYMPHATICS: No lymphadenopathy CARDIAC: ***RRR, no murmurs, rubs, gallops RESPIRATORY:  Clear to auscultation without rales, wheezing or rhonchi  ABDOMEN: Soft, non-tender, non-distended MUSCULOSKELETAL:  No edema; No deformity  SKIN: Warm and dry NEUROLOGIC:  Alert and oriented x 3 PSYCHIATRIC:  Normal affect   ASSESSMENT:    No diagnosis found. PLAN:    In order of problems listed above:  #Tremors: Improved. Now on propranolol.  -Continue propranolol '80mg'$  BID    #HypoNa: Patient attributes this to diet. Will try to increase  solute intake and if fails to improve, will refer to endocrinology.   #CAD s/p PCI in 2008: Doing well without anginal symptoms. Patient remains active.  -Continue ASA '81mg'$  daily -Continue crestor '10mg'$  daily -Continue losartan '100mg'$  daily -Changed coreg to propranolol for tremors as above   #HTN: -Continue losartan '100mg'$  daily -Continue propranolol '80mg'$  BID   #HLD: LDL 56. Goal <70. -Continue crestor '10mg'$  daily       F/U 6 in months      {Are you ordering a CV Procedure (e.g. stress test, cath, DCCV, TEE, etc)?   Press F2        :[id number]}    Medication  Adjustments/Labs and Tests Ordered: Current medicines are reviewed at length with the patient today.  Concerns regarding medicines are outlined above.  No orders of the defined types were placed in this encounter.  No orders of the defined types were placed in this encounter.   There are no Patient Instructions on file for this visit.   Signed, [name], MD  10/25/2021 7:42 PM    [place]

## 2021-10-28 ENCOUNTER — Encounter: Payer: Self-pay | Admitting: Cardiology

## 2021-10-28 ENCOUNTER — Ambulatory Visit: Payer: Medicare Other | Admitting: Cardiology

## 2021-10-28 VITALS — BP 126/78 | HR 54 | Ht 63.0 in | Wt 148.0 lb

## 2021-10-28 DIAGNOSIS — E782 Mixed hyperlipidemia: Secondary | ICD-10-CM

## 2021-10-28 DIAGNOSIS — E871 Hypo-osmolality and hyponatremia: Secondary | ICD-10-CM | POA: Diagnosis not present

## 2021-10-28 DIAGNOSIS — R251 Tremor, unspecified: Secondary | ICD-10-CM | POA: Diagnosis not present

## 2021-10-28 DIAGNOSIS — I10 Essential (primary) hypertension: Secondary | ICD-10-CM | POA: Diagnosis not present

## 2021-10-28 DIAGNOSIS — I2510 Atherosclerotic heart disease of native coronary artery without angina pectoris: Secondary | ICD-10-CM

## 2021-10-28 MED ORDER — PROPRANOLOL HCL 80 MG PO TABS: 80.0000 mg | ORAL_TABLET | Freq: Two times a day (BID) | ORAL | 4 refills | Status: DC

## 2021-10-28 NOTE — Patient Instructions (Signed)
Medication Instructions:   Your physician recommends that you continue on your current medications as directed. Please refer to the Current Medication list given to you today.  *If you need a refill on your cardiac medications before your next appointment, please call your pharmacy*   Follow-Up: At [facility], you and your health needs are our priority.  As part of our continuing mission to provide you with exceptional heart care, we have created designated Provider Care Teams.  These Care Teams include your primary Cardiologist (physician) and Advanced Practice Providers (APPs -  Physician Assistants and Nurse Practitioners) who all work together to provide you with the care you need, when you need it.  We recommend signing up for the patient portal called "MyChart".  Sign up information is provided on this After Visit Summary.  MyChart is used to connect with patients for Virtual Visits (Telemedicine).  Patients are able to view lab/test results, encounter notes, upcoming appointments, etc.  Non-urgent messages can be sent to your provider as well.   To learn more about what you can do with MyChart, go to [email or web address].    Your next appointment:   1 year(s)  The format for your next appointment:   In Person  Provider:   DR. PEMBERTON  Important Information About Sugar       

## 2021-10-28 NOTE — Progress Notes (Signed)
Cardiology Office Note:    Date:  10/28/2021   ID:  Sarah Hendricks, DOB 1946/02/06, MRN [id number]  PCP:  Binnie Rail, MD   [facility] Providers Cardiologist:  Ena Dawley, MD {     Referring MD: Binnie Rail, MD    History of Present Illness:    Sarah Hendricks is a 76 y.o. female with a hx of HTN, HLD, CAD s/p PCI in 2008, and breast cancer s/p lumpectomy and XRT (no chemo) who was previously followed by Dr. Meda Hendricks who now returns to clinic for follow-up.   Last saw Dr. Meda Hendricks in 2018. Underwent exercise stress test in 2018 which was negative for ischemia.  Was last seen in clinic on 04/2021 where she was struggling with tremors. She was otherwise doing well from a CV standpoint.   Today, she states that her tremors have improved with propanolol, though she is concerned about her low heart rate. Her heart rate runs in the 50-60s but she is asymptomatic and active with this.  She reports that her sodium is being closely monitored by her PCP. It has remained relatively stable at 127. It may be related to her lexapro, however, given lack of symptoms, she is just on continued monitoring at this time.   The patient denies chest pain, shortness of breath, nocturnal dyspnea, orthopnea or peripheral edema. There have been no palpitations, lightheadedness or syncope. Compliant with all medications. Blood pressure is very well controlled at home in 120s.   Past Medical History:  Diagnosis Date   Allergy    Arthritis    Breast cancer ([place]) 1994   lumpectomy and XRT, no chemo   CAD (coronary artery disease) 2008   [place], in [place]   GERD (gastroesophageal reflux disease)    History of chicken pox    Hyperlipidemia    Hypertension    Myocardial infarction ([place]) 03/15/2007   stent placed   Vitamin D deficiency     Past Surgical History:  Procedure Laterality Date   BREAST BIOPSY Left 1994   BREAST LUMPECTOMY Left 1994   CATARACT EXTRACTION, BILATERAL     CORONARY  ANGIOPLASTY WITH STENT PLACEMENT  2008   CRANIECTOMY FOR DEPRESSED SKULL FRACTURE  1986   TONSILLECTOMY  1966    Current Medications: Current Meds  Medication Sig   aspirin 81 MG tablet Take 81 mg by mouth daily.   calcium carbonate (OS-CAL) 600 MG TABS tablet Take 600 mg by mouth 2 (two) times daily with a meal.    Cholecalciferol (EQL VITAMIN D3) 1000 units tablet Take 1 tablet (1,000 Units total) by mouth daily.   escitalopram (LEXAPRO) 20 MG tablet TAKE 1 TABLET(20 MG) BY MOUTH DAILY   hydrochlorothiazide (HYDRODIURIL) 12.5 MG tablet Take 1 tablet (12.5 mg total) by mouth daily.   losartan (COZAAR) 100 MG tablet TAKE 1 TABLET(100 MG) BY MOUTH DAILY   Omega-3 Fatty Acids (FISH OIL) 1200 MG CAPS Take by mouth daily.   rosuvastatin (CRESTOR) 10 MG tablet Take 1 tablet (10 mg total) by mouth daily.   vitamin k 100 MCG tablet Take 100 mcg by mouth. Take 1 capsule po every other day for bones   [DISCONTINUED] propranolol (INDERAL) 80 MG tablet Take 1 tablet (80 mg total) by mouth 2 (two) times daily.     Allergies:   Acetaminophen-codeine, Tigan [trimethobenzamide], Alendronate, Atorvastatin, and Codeine   Social History   Socioeconomic History   Marital status: Married    Spouse name: Not on file  Number of children: Not on file   Years of education: Not on file   Highest education level: Not on file  Occupational History   Not on file  Tobacco Use   Smoking status: Never   Smokeless tobacco: Never  Vaping Use   Vaping Use: Never used  Substance and Sexual Activity   Alcohol use: No   Drug use: No   Sexual activity: Not on file  Other Topics Concern   Not on file  Social History Narrative   Married, lives with spouse   Retired from [employer] irregularly, rides an exercise bike   Social Determinants of Health   Financial Resource Strain: [place]  (10/01/2021)   Overall Financial Resource Strain (CARDIA)    Difficulty of Paying Living Expenses: Not  hard at all  Food Insecurity: No [place] (10/01/2021)   Hunger Vital Sign    Worried About Running Out of Food in the Last Year: Never true    [place] in the Last Year: Never true  Transportation Needs: No Transportation Needs (10/01/2021)   PRAPARE - Hydrologist (Medical): No    Lack of Transportation (Non-Medical): No  Physical Activity: Sufficiently Active (10/01/2021)   Exercise Vital Sign    Days of Exercise per Week: 5 days    Minutes of Exercise per Session: 30 min  Stress: No Stress Concern Present (10/01/2021)   [place]    Feeling of Stress : Not at all  Social Connections: [place] (10/01/2021)   Social Connection and Isolation Panel [NHANES]    Frequency of Communication with Friends and Family: More than three times a week    Frequency of Social Gatherings with Friends and Family: Twice a week    Attends Religious Services: More than 4 times per year    Active Member of [employer] or Organizations: No    Attends Music therapist: More than 4 times per year    Marital Status: Married     Family History: The patient's family history includes Arthritis in her father; Breast cancer in her mother; Heart disease in her brother, father, maternal grandfather, and mother; Hypertension in her mother; Stroke in her maternal grandmother and mother. There is no history of Colon cancer, Esophageal cancer, Rectal cancer, or Stomach cancer.  ROS:   Please see the history of present illness.    All other systems reviewed and are negative.  EKGs/Labs/Other Studies Reviewed:    The following studies were reviewed today: ETT June 19, 2016: The patient walked for 6:16 of a Bruce protocol. Peak HR of 141 which is 93% predicted maxmial HR. There were no ST or T wave changes to suggest ischemia. BP response to exercise was normal.  EKG: EKG is personally reviewed.   No new tracing  Recent Labs: 08/20/2021: ALT 15; Hemoglobin 12.6; Platelets 244.0; TSH 1.81 09/10/2021: BUN 14; Creatinine, Ser 0.90; Potassium 3.9; Sodium 127  Recent Lipid Panel    Component Value Date/Time   CHOL 127 08/20/2021 0856   TRIG 133.0 08/20/2021 0856   HDL 49.00 08/20/2021 0856   CHOLHDL 3 08/20/2021 0856   VLDL 26.6 08/20/2021 0856   LDLCALC 52 08/20/2021 0856   LDLDIRECT 87.0 06/12/2018 0946     Risk Assessment/Calculations:           Physical Exam:    VS:  BP 126/78   Pulse (!) 54  Ht '5\' 3"'$  (1.6 m)   Wt 148 lb (67.1 kg)   SpO2 96%   BMI 26.22 kg/m     Wt Readings from Last 3 Encounters:  10/28/21 148 lb (67.1 kg)  09/10/21 145 lb (65.8 kg)  08/20/21 146 lb (66.2 kg)     GEN:  Well nourished, well developed in no acute distress HEENT: Normal NECK: No JVD; No carotid bruits CARDIAC: Bradycardic, regular. No murmurs, rubs, gallops RESPIRATORY:  Clear to auscultation without rales, wheezing or rhonchi  ABDOMEN: Soft, non-tender, non-distended MUSCULOSKELETAL:  No edema; No deformity  SKIN: Warm and dry NEUROLOGIC:  Alert and oriented x 3 PSYCHIATRIC:  Normal affect   ASSESSMENT:    1. Primary hypertension   2. Essential hypertension   3. Mixed hyperlipidemia   4. Hyponatremia   5. Tremors of nervous system   6. Coronary artery disease involving native coronary artery of native heart without angina pectoris    PLAN:    In order of problems listed above:  #Tremors: Improved. Now on propranolol.  -Continue propranolol '80mg'$  BID    #CAD s/p PCI in 2008: Doing well without anginal symptoms. Patient remains active.  -Continue ASA '81mg'$  daily -Continue crestor '10mg'$  daily -Continue losartan '100mg'$  daily -Changed coreg to propranolol for tremors as above   #HTN: -Continue losartan '100mg'$  daily -Continue propranolol '80mg'$  BID -Continue HCTZ 12.'5mg'$  daily -BP well controlled and at goal <120/80s at home   #HLD: LDL 52. Goal <70. -Continue  crestor '10mg'$  daily  #HypoNa: Stable. Monitored by PCP.        F/U 1 year      Medication Adjustments/Labs and Tests Ordered: Current medicines are reviewed at length with the patient today.  Concerns regarding medicines are outlined above.  No orders of the defined types were placed in this encounter.  Meds ordered this encounter  Medications   propranolol (INDERAL) 80 MG tablet    Sig: Take 1 tablet (80 mg total) by mouth 2 (two) times daily.    Dispense:  180 tablet    Refill:  4    Patient Instructions  Medication Instructions:   Your physician recommends that you continue on your current medications as directed. Please refer to the Current Medication list given to you today.  *If you need a refill on your cardiac medications before your next appointment, please call your pharmacy*   Follow-Up: At [facility], you and your health needs are our priority.  As part of our continuing mission to provide you with exceptional heart care, we have created designated Provider Care Teams.  These Care Teams include your primary Cardiologist (physician) and Advanced Practice Providers (APPs -  Physician Assistants and Nurse Practitioners) who all work together to provide you with the care you need, when you need it.  We recommend signing up for the patient portal called "MyChart".  Sign up information is provided on this After Visit Summary.  MyChart is used to connect with patients for Virtual Visits (Telemedicine).  Patients are able to view lab/test results, encounter notes, upcoming appointments, etc.  Non-urgent messages can be sent to your provider as well.   To learn more about what you can do with MyChart, go to [email or web address].    Your next appointment:   1 year(s)  The format for your next appointment:   In Person  Provider:   DR. Johney Frame   Important Information About Sugar          I,Tinashe Williams,acting as a scribe for  [name],  MD.,have documented all relevant documentation on the behalf of [name], MD,as directed by  [name], MD while in the presence of [name], MD.   I, [name], MD, have reviewed all documentation for this visit. The documentation on 10/28/21 for the exam, diagnosis, procedures, and orders are all accurate and complete.

## 2021-11-06 DIAGNOSIS — H353120 Nonexudative age-related macular degeneration, left eye, stage unspecified: Secondary | ICD-10-CM | POA: Diagnosis not present

## 2021-11-06 DIAGNOSIS — H26493 Other secondary cataract, bilateral: Secondary | ICD-10-CM | POA: Diagnosis not present

## 2021-11-23 ENCOUNTER — Ambulatory Visit
Admission: RE | Admit: 2021-11-23 | Discharge: 2021-11-23 | Disposition: A | Discharge status: Home / Self Care | Payer: Medicare Other | Source: Ambulatory Visit | Attending: Internal Medicine | Admitting: Internal Medicine

## 2021-11-23 DIAGNOSIS — Z1231 Encounter for screening mammogram for malignant neoplasm of breast: Secondary | ICD-10-CM

## 2022-01-06 ENCOUNTER — Encounter: Payer: Self-pay | Admitting: Internal Medicine

## 2022-01-06 ENCOUNTER — Ambulatory Visit: Payer: Medicare Other | Admitting: Internal Medicine

## 2022-01-13 ENCOUNTER — Encounter: Payer: Self-pay | Admitting: Cardiology

## 2022-02-05 ENCOUNTER — Other Ambulatory Visit: Payer: Self-pay | Admitting: Internal Medicine

## 2022-02-13 ENCOUNTER — Encounter: Payer: Self-pay | Admitting: Internal Medicine

## 2022-02-13 NOTE — Progress Notes (Unsigned)
Subjective:    Patient ID: Sarah Hendricks, female    DOB: 13-Oct-1945, 76 y.o.   MRN: [id number]     HPI Sarah Hendricks is here for follow up of her chronic medical problems, including CAD, htn, OP, hld, prediabetes, anxiety  Hyponatremia-?  Lexapro  Doing well.  No concerns.    Medications and allergies reviewed with patient and updated if appropriate.  Current Outpatient Medications on File Prior to Visit  Medication Sig Dispense Refill   aspirin 81 MG tablet Take 81 mg by mouth daily.     calcium carbonate (OS-CAL) 600 MG TABS tablet Take 600 mg by mouth 2 (two) times daily with a meal.      Cholecalciferol (EQL VITAMIN D3) 1000 units tablet Take 1 tablet (1,000 Units total) by mouth daily.     escitalopram (LEXAPRO) 20 MG tablet TAKE 1 TABLET(20 MG) BY MOUTH DAILY 90 tablet 1   hydrochlorothiazide (HYDRODIURIL) 12.5 MG tablet Take 1 tablet (12.5 mg total) by mouth daily. 90 tablet 3   losartan (COZAAR) 100 MG tablet TAKE 1 TABLET(100 MG) BY MOUTH DAILY 90 tablet 3   Omega-3 Fatty Acids (FISH OIL) 1200 MG CAPS Take by mouth daily.     propranolol (INDERAL) 80 MG tablet Take 1 tablet (80 mg total) by mouth 2 (two) times daily. 180 tablet 4   rosuvastatin (CRESTOR) 10 MG tablet Take 1 tablet (10 mg total) by mouth daily. 90 tablet 3   vitamin k 100 MCG tablet Take 100 mcg by mouth. Take 1 capsule po every other day for bones     No current facility-administered medications on file prior to visit.     Review of Systems  Constitutional:  Negative for chills and fever.  Respiratory:  Negative for cough, shortness of breath and wheezing.   Cardiovascular:  Negative for chest pain, palpitations and leg swelling.  Neurological:  Negative for light-headedness and headaches.       Objective:   Vitals:   02/15/22 0831  BP: 124/72  Pulse: (!) 59  Temp: 98.1 F (36.7 C)  SpO2: 95%   BP Readings from Last 3 Encounters:  02/15/22 124/72  10/28/21 126/78  09/10/21 128/80    Wt Readings from Last 3 Encounters:  02/15/22 146 lb 9.6 oz (66.5 kg)  10/28/21 148 lb (67.1 kg)  09/10/21 145 lb (65.8 kg)   Body mass index is 25.97 kg/m.    Physical Exam Constitutional:      General: She is not in acute distress.    Appearance: Normal appearance.  HENT:     Head: Normocephalic and atraumatic.  Eyes:     Conjunctiva/sclera: Conjunctivae normal.  Cardiovascular:     Rate and Rhythm: Normal rate and regular rhythm.     Heart sounds: Normal heart sounds. No murmur heard. Pulmonary:     Effort: Pulmonary effort is normal. No respiratory distress.     Breath sounds: Normal breath sounds. No wheezing.  Musculoskeletal:     Cervical back: Neck supple.     Right lower leg: No edema.     Left lower leg: No edema.  Lymphadenopathy:     Cervical: No cervical adenopathy.  Skin:    General: Skin is warm and dry.     Findings: No rash.  Neurological:     Mental Status: She is alert. Mental status is at baseline.  Psychiatric:        Mood and Affect: Mood normal.  Behavior: Behavior normal.        Lab Results  Component Value Date   WBC 6.3 08/20/2021   HGB 12.6 08/20/2021   HCT 36.4 08/20/2021   PLT 244.0 08/20/2021   GLUCOSE 113 (H) 09/10/2021   CHOL 127 08/20/2021   TRIG 133.0 08/20/2021   HDL 49.00 08/20/2021   LDLDIRECT 87.0 06/12/2018   LDLCALC 52 08/20/2021   ALT 15 08/20/2021   AST 22 08/20/2021   NA 127 (L) 09/10/2021   K 3.9 09/10/2021   CL 92 (L) 09/10/2021   CREATININE 0.90 09/10/2021   BUN 14 09/10/2021   CO2 28 09/10/2021   TSH 1.81 08/20/2021   HGBA1C 5.9 08/20/2021     Assessment & Plan:    See Problem List for Assessment and Plan of chronic medical problems.

## 2022-02-13 NOTE — Patient Instructions (Addendum)
     Flu immunization administered today.     Blood work was ordered.     Medications changes include :   none     Return in about 6 months (around 08/17/2022) for Physical Exam.

## 2022-02-15 ENCOUNTER — Ambulatory Visit (INDEPENDENT_AMBULATORY_CARE_PROVIDER_SITE_OTHER): Payer: Medicare Other | Admitting: Internal Medicine

## 2022-02-15 VITALS — BP 124/72 | HR 59 | Temp 98.1°F | Ht 63.0 in | Wt 146.6 lb

## 2022-02-15 DIAGNOSIS — R7303 Prediabetes: Secondary | ICD-10-CM

## 2022-02-15 DIAGNOSIS — M810 Age-related osteoporosis without current pathological fracture: Secondary | ICD-10-CM

## 2022-02-15 DIAGNOSIS — F4329 Adjustment disorder with other symptoms: Secondary | ICD-10-CM

## 2022-02-15 DIAGNOSIS — I10 Essential (primary) hypertension: Secondary | ICD-10-CM

## 2022-02-15 DIAGNOSIS — Z23 Encounter for immunization: Secondary | ICD-10-CM | POA: Diagnosis not present

## 2022-02-15 DIAGNOSIS — E871 Hypo-osmolality and hyponatremia: Secondary | ICD-10-CM

## 2022-02-15 DIAGNOSIS — E7849 Other hyperlipidemia: Secondary | ICD-10-CM | POA: Diagnosis not present

## 2022-02-15 DIAGNOSIS — I2510 Atherosclerotic heart disease of native coronary artery without angina pectoris: Secondary | ICD-10-CM

## 2022-02-15 DIAGNOSIS — E559 Vitamin D deficiency, unspecified: Secondary | ICD-10-CM

## 2022-02-15 LAB — COMPREHENSIVE METABOLIC PANEL
ALT: 13 U/L (ref 0–35)
AST: 18 U/L (ref 0–37)
Albumin: 4.1 g/dL (ref 3.5–5.2)
Alkaline Phosphatase: 68 U/L (ref 39–117)
BUN: 14 mg/dL (ref 6–23)
CO2: 27 mEq/L (ref 19–32)
Calcium: 9.7 mg/dL (ref 8.4–10.5)
Chloride: 95 mEq/L — ABNORMAL LOW (ref 96–112)
Creatinine, Ser: 1.03 mg/dL (ref 0.40–1.20)
GFR: 53.38 mL/min — ABNORMAL LOW (ref >60.00)
Glucose, Bld: 100 mg/dL — ABNORMAL HIGH (ref 70–99)
Potassium: 4 mEq/L (ref 3.5–5.1)
Sodium: 130 mEq/L — ABNORMAL LOW (ref 135–145)
Total Bilirubin: 0.5 mg/dL (ref 0.2–1.2)
Total Protein: 7 g/dL (ref 6.0–8.3)

## 2022-02-15 LAB — CBC WITH DIFFERENTIAL/PLATELET
Basophils Absolute: 0.1 10*3/uL (ref 0.0–0.1)
Basophils Relative: 1.2 % (ref 0.0–3.0)
Eosinophils Absolute: 0.3 10*3/uL (ref 0.0–0.7)
Eosinophils Relative: 4.4 % (ref 0.0–5.0)
HCT: 37.6 % (ref 36.0–46.0)
Hemoglobin: 13 g/dL (ref 12.0–15.0)
Lymphocytes Relative: 23.3 % (ref 12.0–46.0)
Lymphs Abs: 1.4 10*3/uL (ref 0.7–4.0)
MCHC: 34.5 g/dL (ref 30.0–36.0)
MCV: 93.8 fl (ref 78.0–100.0)
Monocytes Absolute: 0.7 10*3/uL (ref 0.1–1.0)
Monocytes Relative: 12.2 % — ABNORMAL HIGH (ref 3.0–12.0)
Neutro Abs: 3.6 10*3/uL (ref 1.4–7.7)
Neutrophils Relative %: 58.9 % (ref 43.0–77.0)
Platelets: 253 10*3/uL (ref 150.0–400.0)
RBC: 4 Mil/uL (ref 3.87–5.11)
RDW: 13.7 % (ref 11.5–15.5)
WBC: 6.1 10*3/uL (ref 4.0–10.5)

## 2022-02-15 LAB — LIPID PANEL
Cholesterol: 125 mg/dL (ref 0–200)
HDL: 47.5 mg/dL (ref >39.00)
LDL Cholesterol: 49 mg/dL (ref 0–99)
NonHDL: 77.25
Total CHOL/HDL Ratio: 3
Triglycerides: 140 mg/dL (ref 0.0–149.0)
VLDL: 28 mg/dL (ref 0.0–40.0)

## 2022-02-15 LAB — HEMOGLOBIN A1C: Hgb A1c MFr Bld: 5.8 % (ref 4.6–6.5)

## 2022-02-15 MED ORDER — HYDROCHLOROTHIAZIDE 12.5 MG PO TABS: 12.5000 mg | ORAL_TABLET | Freq: Every day | ORAL | 3 refills | Status: DC

## 2022-02-15 NOTE — Assessment & Plan Note (Signed)
Chronic With osteoporosis Continue vitamin D daily Vitamin D level at goal 6 months ago

## 2022-02-15 NOTE — Assessment & Plan Note (Signed)
Chronic History of stents in 2008 in [place] Continue aspirin 81 mg daily, Crestor 10 mg daily, propranolol 80 mg twice daily Encouraged healthy diet and regular exercise

## 2022-02-15 NOTE — Assessment & Plan Note (Signed)
Chronic DEXA up-to-date-due 08/2023 Had deferred medication at this time-did not tolerate Fosamax and if the statin in the past Continue calcium and vitamin D daily Vitamin D level 6 months ago was good Encouraged regular exercise

## 2022-02-15 NOTE — Assessment & Plan Note (Signed)
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

## 2022-02-15 NOTE — Assessment & Plan Note (Signed)
Chronic Check a1c Low sugar / carb diet Stressed regular exercise  

## 2022-02-15 NOTE — Assessment & Plan Note (Signed)
Chronic Regular exercise and healthy diet encouraged Check lipid panel  Continue Crestor 10 mg daily-LDL has been at goal around 50

## 2022-02-15 NOTE — Assessment & Plan Note (Signed)
New in April '23 asymptomatic ? Related to lexparo cmp today  Will adjust meds as needed

## 2022-02-15 NOTE — Assessment & Plan Note (Signed)
Chronic Blood pressure well controlled CMP Continue hydrochlorothiazide 12.5 mg daily, losartan 100 mg daily, propranolol 80 mg twice daily

## 2022-02-17 ENCOUNTER — Encounter: Payer: Self-pay | Admitting: Internal Medicine

## 2022-02-17 DIAGNOSIS — E871 Hypo-osmolality and hyponatremia: Secondary | ICD-10-CM

## 2022-02-19 ENCOUNTER — Ambulatory Visit: Payer: Medicare Other | Admitting: Internal Medicine

## 2022-03-05 ENCOUNTER — Other Ambulatory Visit (INDEPENDENT_AMBULATORY_CARE_PROVIDER_SITE_OTHER): Payer: Medicare Other

## 2022-03-05 DIAGNOSIS — E871 Hypo-osmolality and hyponatremia: Secondary | ICD-10-CM

## 2022-03-05 LAB — BASIC METABOLIC PANEL
BUN: 13 mg/dL (ref 6–23)
CO2: 24 mEq/L (ref 19–32)
Calcium: 9.3 mg/dL (ref 8.4–10.5)
Chloride: 96 mEq/L (ref 96–112)
Creatinine, Ser: 0.83 mg/dL (ref 0.40–1.20)
GFR: 69.14 mL/min (ref >60.00)
Glucose, Bld: 95 mg/dL (ref 70–99)
Potassium: 4.1 mEq/L (ref 3.5–5.1)
Sodium: 129 mEq/L — ABNORMAL LOW (ref 135–145)

## 2022-03-16 ENCOUNTER — Encounter: Payer: Self-pay | Admitting: Cardiology

## 2022-04-23 ENCOUNTER — Other Ambulatory Visit: Payer: Self-pay | Admitting: *Deleted

## 2022-04-23 MED ORDER — LOSARTAN POTASSIUM 100 MG PO TABS: ORAL_TABLET | ORAL | 1 refills | Status: DC

## 2022-04-30 ENCOUNTER — Other Ambulatory Visit: Payer: Self-pay

## 2022-04-30 MED ORDER — PROPRANOLOL HCL 80 MG PO TABS: 80.0000 mg | ORAL_TABLET | Freq: Two times a day (BID) | ORAL | 1 refills | Status: DC

## 2022-04-30 MED ORDER — ROSUVASTATIN CALCIUM 10 MG PO TABS: 10.0000 mg | ORAL_TABLET | Freq: Every day | ORAL | 1 refills | Status: DC

## 2022-05-21 DIAGNOSIS — L821 Other seborrheic keratosis: Secondary | ICD-10-CM | POA: Diagnosis not present

## 2022-05-24 ENCOUNTER — Encounter: Payer: Self-pay | Admitting: Internal Medicine

## 2022-05-24 NOTE — Progress Notes (Unsigned)
    Subjective:    Patient ID: Sarah Hendricks, female    DOB: 1946-01-18, 77 y.o.   MRN: [id number]      HPI Sarah Hendricks is here for No chief complaint on file.    Left leg pain -     Medications and allergies reviewed with patient and updated if appropriate.  Current Outpatient Medications on File Prior to Visit  Medication Sig Dispense Refill   aspirin 81 MG tablet Take 81 mg by mouth daily.     calcium carbonate (OS-CAL) 600 MG TABS tablet Take 600 mg by mouth 2 (two) times daily with a meal.      Cholecalciferol (EQL VITAMIN D3) 1000 units tablet Take 1 tablet (1,000 Units total) by mouth daily.     escitalopram (LEXAPRO) 20 MG tablet TAKE 1 TABLET(20 MG) BY MOUTH DAILY 90 tablet 1   hydrochlorothiazide (HYDRODIURIL) 12.5 MG tablet Take 1 tablet (12.5 mg total) by mouth daily. 90 tablet 3   losartan (COZAAR) 100 MG tablet TAKE 1 TABLET(100 MG) BY MOUTH DAILY 90 tablet 1   Omega-3 Fatty Acids (FISH OIL) 1200 MG CAPS Take by mouth daily.     propranolol (INDERAL) 80 MG tablet Take 1 tablet (80 mg total) by mouth 2 (two) times daily. 180 tablet 1   rosuvastatin (CRESTOR) 10 MG tablet Take 1 tablet (10 mg total) by mouth daily. 90 tablet 1   vitamin k 100 MCG tablet Take 100 mcg by mouth. Take 1 capsule po every other day for bones     No current facility-administered medications on file prior to visit.    Review of Systems     Objective:  There were no vitals filed for this visit. BP Readings from Last 3 Encounters:  02/15/22 124/72  10/28/21 126/78  09/10/21 128/80   Wt Readings from Last 3 Encounters:  02/15/22 146 lb 9.6 oz (66.5 kg)  10/28/21 148 lb (67.1 kg)  09/10/21 145 lb (65.8 kg)   There is no height or weight on file to calculate BMI.    Physical Exam         Assessment & Plan:    See Problem List for Assessment and Plan of chronic medical problems.

## 2022-05-25 ENCOUNTER — Ambulatory Visit (INDEPENDENT_AMBULATORY_CARE_PROVIDER_SITE_OTHER): Payer: Medicare Other | Admitting: Internal Medicine

## 2022-05-25 VITALS — BP 130/68 | HR 60 | Temp 98.2°F | Ht 63.0 in | Wt 152.0 lb

## 2022-05-25 DIAGNOSIS — E871 Hypo-osmolality and hyponatremia: Secondary | ICD-10-CM | POA: Diagnosis not present

## 2022-05-25 DIAGNOSIS — M79605 Pain in left leg: Secondary | ICD-10-CM | POA: Insufficient documentation

## 2022-05-25 DIAGNOSIS — M25552 Pain in left hip: Secondary | ICD-10-CM | POA: Diagnosis not present

## 2022-05-25 DIAGNOSIS — M25561 Pain in right knee: Secondary | ICD-10-CM | POA: Diagnosis not present

## 2022-05-25 DIAGNOSIS — I10 Essential (primary) hypertension: Secondary | ICD-10-CM

## 2022-05-25 LAB — BASIC METABOLIC PANEL
BUN: 12 mg/dL (ref 6–23)
CO2: 28 mEq/L (ref 19–32)
Calcium: 9.3 mg/dL (ref 8.4–10.5)
Chloride: 94 mEq/L — ABNORMAL LOW (ref 96–112)
Creatinine, Ser: 0.84 mg/dL (ref 0.40–1.20)
GFR: 68.05 mL/min (ref >60.00)
Glucose, Bld: 101 mg/dL — ABNORMAL HIGH (ref 70–99)
Potassium: 4.2 mEq/L (ref 3.5–5.1)
Sodium: 129 mEq/L — ABNORMAL LOW (ref 135–145)

## 2022-05-25 NOTE — Assessment & Plan Note (Signed)
Subacute Started a few months ago after a fall Tenderness on lateral aspect of hip and pain when she lays on it-possible bursitis Also has pain with standing in her left hip and left anterior lower leg-?  Radiculopathy Taking Tylenol without much relief Does not tolerate NSAIDs because does elevate her blood pressure Recommended sports medicine evaluation-referral ordered

## 2022-05-25 NOTE — Assessment & Plan Note (Addendum)
Subacute Right posterior knee pain - no tenderness to palpation ? Cyst Related to OA Advised sports medicine evaluation - referral ordered Continue tylenol

## 2022-05-25 NOTE — Assessment & Plan Note (Signed)
Neck Blood pressure well-controlled Will check BMP today due to hyponatremia Continue hydrochlorothiazide 12.5 mg daily, losartan 100 mg daily, propranolol 80 mg twice daily

## 2022-05-25 NOTE — Patient Instructions (Signed)
      Blood work was ordered.   The lab is on the first floor.      Make an appointment for sports medicine downstairs - right knee pain, left hip and lower leg pain    Medications changes include :   none    A referral was ordered for sports medicine.     Someone will call you to schedule an appointment.

## 2022-05-25 NOTE — Assessment & Plan Note (Signed)
Subacute Started a few months ago after a fall pain with standing in her left hip and left anterior lower leg-?  Radiculopathy Taking Tylenol without much relief Does not tolerate NSAIDs because does elevate her blood pressure Recommended sports medicine evaluation-referral ordered

## 2022-05-25 NOTE — Assessment & Plan Note (Signed)
Subacute BMP today We did adjust her medication need to reevaluate how her sodium is

## 2022-05-31 ENCOUNTER — Encounter: Payer: Self-pay | Admitting: Internal Medicine

## 2022-05-31 NOTE — Progress Notes (Unsigned)
[name] D.[place] Phone: [phone]   Assessment and Plan:     There are no diagnoses linked to this encounter.  ***   Pertinent previous records reviewed include ***   Follow Up: ***     Subjective:   I, [name], am serving as a Education administrator for [employer]  Chief Complaint: left hip ,leg, and right knee pain   HPI:   06/01/2022 Patient is a 77 year old female complaining of left hip, leg and right knee pain . Patient states  Relevant Historical Information: ***  Additional pertinent review of systems negative.   Current Outpatient Medications:    aspirin 81 MG tablet, Take 81 mg by mouth daily., Disp: , Rfl:    calcium carbonate (OS-CAL) 600 MG TABS tablet, Take 600 mg by mouth 2 (two) times daily with a meal. , Disp: , Rfl:    Cholecalciferol (EQL VITAMIN D3) 1000 units tablet, Take 1 tablet (1,000 Units total) by mouth daily., Disp: , Rfl:    escitalopram (LEXAPRO) 20 MG tablet, TAKE 1 TABLET(20 MG) BY MOUTH DAILY, Disp: 90 tablet, Rfl: 1   hydrochlorothiazide (HYDRODIURIL) 12.5 MG tablet, Take 1 tablet (12.5 mg total) by mouth daily., Disp: 90 tablet, Rfl: 3   losartan (COZAAR) 100 MG tablet, TAKE 1 TABLET(100 MG) BY MOUTH DAILY, Disp: 90 tablet, Rfl: 1   Omega-3 Fatty Acids (FISH OIL) 1200 MG CAPS, Take by mouth daily., Disp: , Rfl:    propranolol (INDERAL) 80 MG tablet, Take 1 tablet (80 mg total) by mouth 2 (two) times daily., Disp: 180 tablet, Rfl: 1   rosuvastatin (CRESTOR) 10 MG tablet, Take 1 tablet (10 mg total) by mouth daily., Disp: 90 tablet, Rfl: 1   vitamin k 100 MCG tablet, Take 100 mcg by mouth. Take 1 capsule po every other day for bones, Disp: , Rfl:    Objective:     There were no vitals filed for this visit.    There is no height or weight on file to calculate BMI.    Physical Exam:    ***   Electronically signed by:  [name] D.[facility] 12:11 PM 05/31/22

## 2022-06-01 ENCOUNTER — Ambulatory Visit (INDEPENDENT_AMBULATORY_CARE_PROVIDER_SITE_OTHER): Payer: Medicare Other

## 2022-06-01 ENCOUNTER — Ambulatory Visit (INDEPENDENT_AMBULATORY_CARE_PROVIDER_SITE_OTHER): Payer: Medicare Other | Admitting: Sports Medicine

## 2022-06-01 VITALS — HR 57 | Ht 63.0 in | Wt 152.0 lb

## 2022-06-01 DIAGNOSIS — M25552 Pain in left hip: Secondary | ICD-10-CM

## 2022-06-01 DIAGNOSIS — M7062 Trochanteric bursitis, left hip: Secondary | ICD-10-CM

## 2022-06-01 DIAGNOSIS — G8929 Other chronic pain: Secondary | ICD-10-CM

## 2022-06-01 DIAGNOSIS — M1711 Unilateral primary osteoarthritis, right knee: Secondary | ICD-10-CM

## 2022-06-01 DIAGNOSIS — M25561 Pain in right knee: Secondary | ICD-10-CM | POA: Diagnosis not present

## 2022-06-01 MED ORDER — MELOXICAM 15 MG PO TABS: 15.0000 mg | ORAL_TABLET | Freq: Every day | ORAL | 0 refills | Status: DC

## 2022-06-01 NOTE — Patient Instructions (Addendum)
Good to see you  - Start meloxicam 15 mg daily x2 weeks.  If still having pain after 2 weeks, complete 3rd-week of meloxicam. May use remaining meloxicam as needed once daily for pain control.  Do not to use additional NSAIDs while taking meloxicam.  May use Tylenol [phone] mg 2 to 3 times a day for breakthrough pain. IT band , knee, glute HEP  3-4 week follow up

## 2022-06-02 ENCOUNTER — Telehealth: Payer: Self-pay | Admitting: Sports Medicine

## 2022-06-02 ENCOUNTER — Encounter: Payer: Self-pay | Admitting: Sports Medicine

## 2022-06-02 ENCOUNTER — Ambulatory Visit: Payer: BLUE CROSS/BLUE SHIELD | Admitting: Internal Medicine

## 2022-06-02 MED ORDER — DULOXETINE HCL 20 MG PO CPEP: 20.0000 mg | ORAL_CAPSULE | Freq: Every day | ORAL | 3 refills | Status: DC

## 2022-06-02 NOTE — Telephone Encounter (Signed)
Have done a PA through cover my meds. Waiting on a fax response of approval or denial

## 2022-06-02 NOTE — Telephone Encounter (Signed)
Pt called about this msg, informed her we have started the PA process.

## 2022-06-02 NOTE — Telephone Encounter (Signed)
Patient called stating that the pharmacy is not able to fill her Meloxicam due to "insurance". Can you help?  Thinking maybe it needs a PA?

## 2022-06-28 NOTE — Progress Notes (Unsigned)
    Sarah Hendricks D.[place] Phone: [phone]   Assessment and Plan:     There are no diagnoses linked to this encounter.  ***   Pertinent previous records reviewed include ***   Follow Up: ***     Subjective:   I, Sarah Hendricks, am serving as a Education administrator for [employer]   Chief Complaint: left hip ,leg, and right knee pain    HPI:    06/01/2022 Patient is a 77 year old female complaining of left hip, leg and right knee pain . Patient states she has had 3 falls , has fallen on the same side each time, pain radiates down her leg, pain has been there for months , burns thinks it could be bursitis , pain increases when she lays on that side, she is a side sleeper    Knee- pain in the back of the knee , been going on for a couple of months   Ib and tylenol every now and then, kind of sorta helps,  no numbness or tingling   06/29/2022 Patient states    Relevant Historical Information: Hypertension, osteoporosis, prediabetes  Additional pertinent review of systems negative.   Current Outpatient Medications:    aspirin 81 MG tablet, Take 81 mg by mouth daily., Disp: , Rfl:    calcium carbonate (OS-CAL) 600 MG TABS tablet, Take 600 mg by mouth 2 (two) times daily with a meal. , Disp: , Rfl:    Cholecalciferol (EQL VITAMIN D3) 1000 units tablet, Take 1 tablet (1,000 Units total) by mouth daily., Disp: , Rfl:    DULoxetine (CYMBALTA) 20 MG capsule, Take 1 capsule (20 mg total) by mouth daily., Disp: 30 capsule, Rfl: 3   hydrochlorothiazide (HYDRODIURIL) 12.5 MG tablet, Take 1 tablet (12.5 mg total) by mouth daily., Disp: 90 tablet, Rfl: 3   losartan (COZAAR) 100 MG tablet, TAKE 1 TABLET(100 MG) BY MOUTH DAILY, Disp: 90 tablet, Rfl: 1   meloxicam (MOBIC) 15 MG tablet, Take 1 tablet (15 mg total) by mouth daily., Disp: 30 tablet, Rfl: 0   Omega-3 Fatty Acids (FISH OIL) 1200 MG CAPS, Take by mouth  daily., Disp: , Rfl:    propranolol (INDERAL) 80 MG tablet, Take 1 tablet (80 mg total) by mouth 2 (two) times daily., Disp: 180 tablet, Rfl: 1   rosuvastatin (CRESTOR) 10 MG tablet, Take 1 tablet (10 mg total) by mouth daily., Disp: 90 tablet, Rfl: 1   vitamin k 100 MCG tablet, Take 100 mcg by mouth. Take 1 capsule po every other day for bones, Disp: , Rfl:    Objective:     There were no vitals filed for this visit.    There is no height or weight on file to calculate BMI.    Physical Exam:    ***   Electronically signed by:  Sarah Hendricks D.[facility] 4:27 PM 06/28/22

## 2022-06-29 ENCOUNTER — Ambulatory Visit: Payer: Medicare Other | Admitting: Sports Medicine

## 2022-06-29 VITALS — HR 70 | Ht 66.0 in | Wt 151.0 lb

## 2022-06-29 DIAGNOSIS — M25561 Pain in right knee: Secondary | ICD-10-CM

## 2022-06-29 DIAGNOSIS — G8929 Other chronic pain: Secondary | ICD-10-CM

## 2022-06-29 DIAGNOSIS — M7062 Trochanteric bursitis, left hip: Secondary | ICD-10-CM

## 2022-06-29 DIAGNOSIS — M1711 Unilateral primary osteoarthritis, right knee: Secondary | ICD-10-CM | POA: Diagnosis not present

## 2022-06-29 DIAGNOSIS — M25552 Pain in left hip: Secondary | ICD-10-CM | POA: Diagnosis not present

## 2022-06-29 NOTE — Patient Instructions (Signed)
Good to see you   

## 2022-07-19 NOTE — Progress Notes (Unsigned)
    [name] D.[place] Phone: [phone]   Assessment and Plan:     There are no diagnoses linked to this encounter.  ***   Pertinent previous records reviewed include ***   Follow Up: ***     Subjective:   I, Leighann Amadon, am serving as a Education administrator for [employer]   Chief Complaint: left hip ,leg, and right knee pain    HPI:    06/01/2022 Patient is a 77 year old female complaining of left hip, leg and right knee pain . Patient states she has had 3 falls , has fallen on the same side each time, pain radiates down her leg, pain has been there for months , burns thinks it could be bursitis , pain increases when she lays on that side, she is a side sleeper    Knee- pain in the back of the knee , been going on for a couple of months   Ib and tylenol every now and then, kind of sorta helps,  no numbness or tingling    06/29/2022 Patient states that she still has pain , once she stopped meloxicam the pain came back    07/20/2022 Patient states    Relevant Historical Information: Hypertension, osteoporosis, prediabetes  Additional pertinent review of systems negative.   Current Outpatient Medications:    aspirin 81 MG tablet, Take 81 mg by mouth daily., Disp: , Rfl:    calcium carbonate (OS-CAL) 600 MG TABS tablet, Take 600 mg by mouth 2 (two) times daily with a meal. , Disp: , Rfl:    Cholecalciferol (EQL VITAMIN D3) 1000 units tablet, Take 1 tablet (1,000 Units total) by mouth daily., Disp: , Rfl:    DULoxetine (CYMBALTA) 20 MG capsule, Take 1 capsule (20 mg total) by mouth daily., Disp: 30 capsule, Rfl: 3   hydrochlorothiazide (HYDRODIURIL) 12.5 MG tablet, Take 1 tablet (12.5 mg total) by mouth daily., Disp: 90 tablet, Rfl: 3   losartan (COZAAR) 100 MG tablet, TAKE 1 TABLET(100 MG) BY MOUTH DAILY, Disp: 90 tablet, Rfl: 1   meloxicam (MOBIC) 15 MG tablet, Take 1 tablet (15 mg total)  by mouth daily., Disp: 30 tablet, Rfl: 0   Omega-3 Fatty Acids (FISH OIL) 1200 MG CAPS, Take by mouth daily., Disp: , Rfl:    propranolol (INDERAL) 80 MG tablet, Take 1 tablet (80 mg total) by mouth 2 (two) times daily., Disp: 180 tablet, Rfl: 1   rosuvastatin (CRESTOR) 10 MG tablet, Take 1 tablet (10 mg total) by mouth daily., Disp: 90 tablet, Rfl: 1   vitamin k 100 MCG tablet, Take 100 mcg by mouth. Take 1 capsule po every other day for bones, Disp: , Rfl:    Objective:     There were no vitals filed for this visit.    There is no height or weight on file to calculate BMI.    Physical Exam:    ***   Electronically signed by:  [name] D.[facility] 11:54 AM 07/19/22

## 2022-07-20 ENCOUNTER — Ambulatory Visit: Payer: Medicare Other | Admitting: Sports Medicine

## 2022-07-20 VITALS — HR 69 | Ht 66.0 in | Wt 152.0 lb

## 2022-07-20 DIAGNOSIS — M25561 Pain in right knee: Secondary | ICD-10-CM

## 2022-07-20 DIAGNOSIS — M25552 Pain in left hip: Secondary | ICD-10-CM

## 2022-07-20 DIAGNOSIS — M1711 Unilateral primary osteoarthritis, right knee: Secondary | ICD-10-CM | POA: Diagnosis not present

## 2022-07-20 DIAGNOSIS — G8929 Other chronic pain: Secondary | ICD-10-CM

## 2022-07-20 DIAGNOSIS — M7062 Trochanteric bursitis, left hip: Secondary | ICD-10-CM | POA: Diagnosis not present

## 2022-07-20 NOTE — Patient Instructions (Addendum)
Good to see you 2 week follow up

## 2022-07-26 ENCOUNTER — Other Ambulatory Visit: Payer: Self-pay | Admitting: Internal Medicine

## 2022-07-26 DIAGNOSIS — Z1231 Encounter for screening mammogram for malignant neoplasm of breast: Secondary | ICD-10-CM

## 2022-07-27 NOTE — Progress Notes (Signed)
[name] D.[place] Phone: [phone]   Assessment and Plan:     1. Primary osteoarthritis of right knee 2. Chronic pain of right knee -Chronic with exacerbation, subsequent visit - Mild to moderate relief after intra-articular knee CSI performed on 06/29/2022, however pain returned and was worsening after 2 to 3 weeks - Patient elected for Zilretta injection to see if this would provide longer lasting relief.  Tolerated well below - Continue HEP and Tylenol for day-to-day pain relief  Procedure: Knee Joint Injection Side: Right Indication: Flare of osteoarthritis  Risks explained and consent was given verbally. The site was cleaned with alcohol prep. A needle was introduced with an anterio-lateral approach. Injection given using Zilretta 32 mg. This was well tolerated and resulted in symptomatic relief.  Needle was removed, hemostasis achieved, and post injection instructions were explained.   Pt was advised to call or return to clinic if these symptoms worsen or fail to improve as anticipated.   Other orders - Triamcinolone Acetonide (ZILRETTA) intra-articular injection 32 mg    Pertinent previous records reviewed include none   Follow Up: 3 to 4 weeks for reevaluation.  If no improvement or worsening of symptoms, could discuss physical therapy versus HA injection versus PRP injection.  Could also further discuss left hip which has previously improved with greater trochanteric CSI with most recent being on 06/29/2022   Subjective:   I, Pincus Badder, am serving as a Education administrator for Doctor Glennon Mac   Chief Complaint: left hip ,leg, and right knee pain    HPI:    06/01/2022 Patient is a 77 year old female complaining of left hip, leg and right knee pain . Patient states she has had 3 falls , has fallen on the same side each time, pain radiates down her leg, pain has been there for months , burns  thinks it could be bursitis , pain increases when she lays on that side, she is a side sleeper    Knee- pain in the back of the knee , been going on for a couple of months   Ib and tylenol every now and then, kind of sorta helps,  no numbness or tingling    06/29/2022 Patient states that she still has pain , once she stopped meloxicam the pain came back    07/20/2022 Patient states that she is pretty good, hip is much better, knee is so so   08/03/2022 Patient states    Relevant Historical Information: Hypertension, osteoporosis, prediabetes  Additional pertinent review of systems negative.   Current Outpatient Medications:    aspirin 81 MG tablet, Take 81 mg by mouth daily., Disp: , Rfl:    calcium carbonate (OS-CAL) 600 MG TABS tablet, Take 600 mg by mouth 2 (two) times daily with a meal. , Disp: , Rfl:    Cholecalciferol (EQL VITAMIN D3) 1000 units tablet, Take 1 tablet (1,000 Units total) by mouth daily., Disp: , Rfl:    DULoxetine (CYMBALTA) 20 MG capsule, Take 1 capsule (20 mg total) by mouth daily., Disp: 30 capsule, Rfl: 3   hydrochlorothiazide (HYDRODIURIL) 12.5 MG tablet, Take 1 tablet (12.5 mg total) by mouth daily., Disp: 90 tablet, Rfl: 3   losartan (COZAAR) 100 MG tablet, TAKE 1 TABLET(100 MG) BY MOUTH DAILY, Disp: 90 tablet, Rfl: 1   meloxicam (MOBIC) 15 MG tablet, Take 1 tablet (15 mg total) by mouth daily., Disp: 30 tablet, Rfl: 0  Omega-3 Fatty Acids (FISH OIL) 1200 MG CAPS, Take by mouth daily., Disp: , Rfl:    propranolol (INDERAL) 80 MG tablet, Take 1 tablet (80 mg total) by mouth 2 (two) times daily., Disp: 180 tablet, Rfl: 1   rosuvastatin (CRESTOR) 10 MG tablet, Take 1 tablet (10 mg total) by mouth daily., Disp: 90 tablet, Rfl: 1   vitamin k 100 MCG tablet, Take 100 mcg by mouth. Take 1 capsule po every other day for bones, Disp: , Rfl:       Electronically signed by:  [name] D.[facility] 10:10 AM 08/03/22

## 2022-07-29 ENCOUNTER — Other Ambulatory Visit: Payer: Self-pay | Admitting: Internal Medicine

## 2022-08-01 ENCOUNTER — Other Ambulatory Visit: Payer: Self-pay | Admitting: Internal Medicine

## 2022-08-03 ENCOUNTER — Ambulatory Visit: Payer: Medicare Other | Admitting: Sports Medicine

## 2022-08-03 DIAGNOSIS — G8929 Other chronic pain: Secondary | ICD-10-CM

## 2022-08-03 DIAGNOSIS — M1711 Unilateral primary osteoarthritis, right knee: Secondary | ICD-10-CM | POA: Diagnosis not present

## 2022-08-03 DIAGNOSIS — M25561 Pain in right knee: Secondary | ICD-10-CM

## 2022-08-03 MED ORDER — TRIAMCINOLONE ACETONIDE 32 MG IX SRER
32.0000 mg | Freq: Once | INTRA_ARTICULAR | Status: AC
  Administered 2022-08-03: 32 mg via INTRA_ARTICULAR

## 2022-08-03 NOTE — Addendum Note (Signed)
Addended by: Glennon Mac on: 08/03/2022 11:04 AM   Modules accepted: Level of Service

## 2022-08-22 ENCOUNTER — Encounter: Payer: Self-pay | Admitting: Internal Medicine

## 2022-08-22 NOTE — Patient Instructions (Addendum)
Blood work was ordered.   The lab is on the first floor.    Medications changes include :   none    A referral was ordered for XXX.     Someone will call you to schedule an appointment.    Return in about 6 months (around 02/22/2023) for follow up.   Health Maintenance, Female Adopting a healthy lifestyle and getting preventive care are important in promoting health and wellness. Ask your health care provider about: The right schedule for you to have regular tests and exams. Things you can do on your own to prevent diseases and keep yourself healthy. What should I know about diet, weight, and exercise? Eat a healthy diet  Eat a diet that includes plenty of vegetables, fruits, low-fat dairy products, and lean protein. Do not eat a lot of foods that are high in solid fats, added sugars, or sodium. Maintain a healthy weight Body mass index (BMI) is used to identify weight problems. It estimates body fat based on height and weight. Your health care provider can help determine your BMI and help you achieve or maintain a healthy weight. Get regular exercise Get regular exercise. This is one of the most important things you can do for your health. Most adults should: Exercise for at least 150 minutes each week. The exercise should increase your heart rate and make you sweat (moderate-intensity exercise). Do strengthening exercises at least twice a week. This is in addition to the moderate-intensity exercise. Spend less time sitting. Even light physical activity can be beneficial. Watch cholesterol and blood lipids Have your blood tested for lipids and cholesterol at 76 years of age, then have this test every 5 years. Have your cholesterol levels checked more often if: Your lipid or cholesterol levels are high. You are older than 76 years of age. You are at high risk for heart disease. What should I know about cancer screening? Depending on your health history and family  history, you may need to have cancer screening at various ages. This may include screening for: Breast cancer. Cervical cancer. Colorectal cancer. Skin cancer. Lung cancer. What should I know about heart disease, diabetes, and high blood pressure? Blood pressure and heart disease High blood pressure causes heart disease and increases the risk of stroke. This is more likely to develop in people who have high blood pressure readings or are overweight. Have your blood pressure checked: Every 3-5 years if you are 16-26 years of age. Every year if you are 41 years old or older. Diabetes Have regular diabetes screenings. This checks your fasting blood sugar level. Have the screening done: Once every three years after age 99 if you are at a normal weight and have a low risk for diabetes. More often and at a younger age if you are overweight or have a high risk for diabetes. What should I know about preventing infection? Hepatitis B If you have a higher risk for hepatitis B, you should be screened for this virus. Talk with your health care provider to find out if you are at risk for hepatitis B infection. Hepatitis C Testing is recommended for: Everyone born from [birth year] through 1964. Anyone with known risk factors for hepatitis C. Sexually transmitted infections (STIs) Get screened for STIs, including gonorrhea and chlamydia, if: You are sexually active and are younger than 76 years of age. You are older than 76 years of age and your health care provider tells you that you are at  risk for this type of infection. Your sexual activity has changed since you were last screened, and you are at increased risk for chlamydia or gonorrhea. Ask your health care provider if you are at risk. Ask your health care provider about whether you are at high risk for HIV. Your health care provider may recommend a prescription medicine to help prevent HIV infection. If you choose to take medicine to prevent HIV, you  should first get tested for HIV. You should then be tested every 3 months for as long as you are taking the medicine. Pregnancy If you are about to stop having your period (premenopausal) and you may become pregnant, seek counseling before you get pregnant. Take 400 to 800 micrograms (mcg) of folic acid every day if you become pregnant. Ask for birth control (contraception) if you want to prevent pregnancy. Osteoporosis and menopause Osteoporosis is a disease in which the bones lose minerals and strength with aging. This can result in bone fractures. If you are 65 years old or older, or if you are at risk for osteoporosis and fractures, ask your health care provider if you should: Be screened for bone loss. Take a calcium or vitamin D supplement to lower your risk of fractures. Be given hormone replacement therapy (HRT) to treat symptoms of menopause. Follow these instructions at home: Alcohol use Do not drink alcohol if: Your health care provider tells you not to drink. You are pregnant, may be pregnant, or are planning to become pregnant. If you drink alcohol: Limit how much you have to: 0-1 drink a day. Know how much alcohol is in your drink. In the [place] one drink equals one 12 oz bottle of beer (355 mL), one 5 oz glass of wine (148 mL), or one 1 oz glass of hard liquor (44 mL). Lifestyle Do not use any products that contain nicotine or tobacco. These products include cigarettes, chewing tobacco, and vaping devices, such as e-cigarettes. If you need help quitting, ask your health care provider. Do not use street drugs. Do not share needles. Ask your health care provider for help if you need support or information about quitting drugs. General instructions Schedule regular health, dental, and eye exams. Stay current with your vaccines. Tell your health care provider if: You often feel depressed. You have ever been abused or do not feel safe at home. Summary Adopting a healthy  lifestyle and getting preventive care are important in promoting health and wellness. Follow your health care provider's instructions about healthy diet, exercising, and getting tested or screened for diseases. Follow your health care provider's instructions on monitoring your cholesterol and blood pressure. This information is not intended to replace advice given to you by your health care provider. Make sure you discuss any questions you have with your health care provider. Document Revised: 09/22/2020 Document Reviewed: 09/22/2020 Elsevier Patient Education  [place].

## 2022-08-23 ENCOUNTER — Ambulatory Visit (INDEPENDENT_AMBULATORY_CARE_PROVIDER_SITE_OTHER): Payer: Medicare Other | Admitting: Internal Medicine

## 2022-08-23 ENCOUNTER — Encounter: Payer: Self-pay | Admitting: Internal Medicine

## 2022-08-23 VITALS — BP 122/78 | HR 55 | Temp 97.9°F | Ht 66.0 in | Wt 150.0 lb

## 2022-08-23 DIAGNOSIS — R7303 Prediabetes: Secondary | ICD-10-CM | POA: Diagnosis not present

## 2022-08-23 DIAGNOSIS — I10 Essential (primary) hypertension: Secondary | ICD-10-CM

## 2022-08-23 DIAGNOSIS — Z853 Personal history of malignant neoplasm of breast: Secondary | ICD-10-CM

## 2022-08-23 DIAGNOSIS — I2510 Atherosclerotic heart disease of native coronary artery without angina pectoris: Secondary | ICD-10-CM

## 2022-08-23 DIAGNOSIS — E559 Vitamin D deficiency, unspecified: Secondary | ICD-10-CM

## 2022-08-23 DIAGNOSIS — E7849 Other hyperlipidemia: Secondary | ICD-10-CM

## 2022-08-23 DIAGNOSIS — Z0000 Encounter for general adult medical examination without abnormal findings: Secondary | ICD-10-CM

## 2022-08-23 DIAGNOSIS — F4329 Adjustment disorder with other symptoms: Secondary | ICD-10-CM

## 2022-08-23 DIAGNOSIS — M810 Age-related osteoporosis without current pathological fracture: Secondary | ICD-10-CM

## 2022-08-23 DIAGNOSIS — E871 Hypo-osmolality and hyponatremia: Secondary | ICD-10-CM

## 2022-08-23 LAB — LIPID PANEL
Cholesterol: 143 mg/dL (ref 0–200)
HDL: 77.7 mg/dL (ref >39.00)
LDL Cholesterol: 51 mg/dL (ref 0–99)
NonHDL: 65.15
Total CHOL/HDL Ratio: 2
Triglycerides: 69 mg/dL (ref 0.0–149.0)
VLDL: 13.8 mg/dL (ref 0.0–40.0)

## 2022-08-23 LAB — COMPREHENSIVE METABOLIC PANEL
ALT: 15 U/L (ref 0–35)
AST: 15 U/L (ref 0–37)
Albumin: 3.9 g/dL (ref 3.5–5.2)
Alkaline Phosphatase: 68 U/L (ref 39–117)
BUN: 11 mg/dL (ref 6–23)
CO2: 27 mEq/L (ref 19–32)
Calcium: 9.2 mg/dL (ref 8.4–10.5)
Chloride: 91 mEq/L — ABNORMAL LOW (ref 96–112)
Creatinine, Ser: 0.86 mg/dL (ref 0.40–1.20)
GFR: 66.04 mL/min (ref >60.00)
Glucose, Bld: 90 mg/dL (ref 70–99)
Potassium: 3.7 mEq/L (ref 3.5–5.1)
Sodium: 125 mEq/L — ABNORMAL LOW (ref 135–145)
Total Bilirubin: 0.6 mg/dL (ref 0.2–1.2)
Total Protein: 6.5 g/dL (ref 6.0–8.3)

## 2022-08-23 LAB — TSH: TSH: 1.17 u[IU]/mL (ref 0.35–5.50)

## 2022-08-23 LAB — CBC WITH DIFFERENTIAL/PLATELET
Basophils Absolute: 0.1 10*3/uL (ref 0.0–0.1)
Basophils Relative: 0.8 % (ref 0.0–3.0)
Eosinophils Absolute: 0.1 10*3/uL (ref 0.0–0.7)
Eosinophils Relative: 2.1 % (ref 0.0–5.0)
HCT: 37.9 % (ref 36.0–46.0)
Hemoglobin: 13 g/dL (ref 12.0–15.0)
Lymphocytes Relative: 24.7 % (ref 12.0–46.0)
Lymphs Abs: 1.7 10*3/uL (ref 0.7–4.0)
MCHC: 34.2 g/dL (ref 30.0–36.0)
MCV: 90 fl (ref 78.0–100.0)
Monocytes Absolute: 0.8 10*3/uL (ref 0.1–1.0)
Monocytes Relative: 11.9 % (ref 3.0–12.0)
Neutro Abs: 4.1 10*3/uL (ref 1.4–7.7)
Neutrophils Relative %: 60.5 % (ref 43.0–77.0)
Platelets: 266 10*3/uL (ref 150.0–400.0)
RBC: 4.21 Mil/uL (ref 3.87–5.11)
RDW: 16.1 % — ABNORMAL HIGH (ref 11.5–15.5)
WBC: 6.8 10*3/uL (ref 4.0–10.5)

## 2022-08-23 LAB — HEMOGLOBIN A1C: Hgb A1c MFr Bld: 5.8 % (ref 4.6–6.5)

## 2022-08-23 LAB — VITAMIN D 25 HYDROXY (VIT D DEFICIENCY, FRACTURES): VITD: 54.64 ng/mL (ref 30.00–100.00)

## 2022-08-23 MED ORDER — HYDROCHLOROTHIAZIDE 12.5 MG PO TABS: 12.5000 mg | ORAL_TABLET | Freq: Every day | ORAL | 3 refills | Status: DC

## 2022-08-23 MED ORDER — ESCITALOPRAM OXALATE 10 MG PO TABS: 10.0000 mg | ORAL_TABLET | Freq: Every day | ORAL | 1 refills | Status: DC

## 2022-08-23 NOTE — Assessment & Plan Note (Signed)
Chronic Regular exercise and healthy diet encouraged Check lipid panel  Continue Crestor 10 mg daily  Lab Results  Component Value Date   LDLCALC 49 02/15/2022

## 2022-08-23 NOTE — Progress Notes (Signed)
Subjective:    Patient ID: Sarah Hendricks, female    DOB: [birth year], 77 y.o.   MRN: [id number]      HPI Sarah Hendricks is here for a Physical exam and her chronic medical problems.    No concerns.  Decreased lexapro to 10 mg - did not start cymbalta.  Anxiety/depression controlled.    Medications and allergies reviewed with patient and updated if appropriate.  Current Outpatient Medications on File Prior to Visit  Medication Sig Dispense Refill   aspirin 81 MG tablet Take 81 mg by mouth daily.     calcium carbonate (OS-CAL) 600 MG TABS tablet Take 600 mg by mouth 2 (two) times daily with a meal.      Cholecalciferol (EQL VITAMIN D3) 1000 units tablet Take 1 tablet (1,000 Units total) by mouth daily.     escitalopram (LEXAPRO) 10 MG tablet Take 10 mg by mouth daily.     hydrochlorothiazide (HYDRODIURIL) 12.5 MG tablet Take 1 tablet (12.5 mg total) by mouth daily. 90 tablet 3   losartan (COZAAR) 100 MG tablet TAKE 1 TABLET(100 MG) BY MOUTH DAILY 90 tablet 1   meloxicam (MOBIC) 15 MG tablet Take 1 tablet (15 mg total) by mouth daily. 30 tablet 0   Omega-3 Fatty Acids (FISH OIL) 1200 MG CAPS Take by mouth daily.     propranolol (INDERAL) 80 MG tablet Take 1 tablet (80 mg total) by mouth 2 (two) times daily. 180 tablet 1   rosuvastatin (CRESTOR) 10 MG tablet Take 1 tablet (10 mg total) by mouth daily. 90 tablet 1   vitamin k 100 MCG tablet Take 100 mcg by mouth. Take 1 capsule po every other day for bones     No current facility-administered medications on file prior to visit.    Review of Systems  Constitutional:  Negative for fever.  HENT:  Positive for rhinorrhea (seasonal allergies).   Eyes:  Negative for visual disturbance.  Respiratory:  Positive for cough (seasonal allergies). Negative for shortness of breath and wheezing.   Cardiovascular:  Negative for chest pain, palpitations and leg swelling.  Gastrointestinal:  Negative for abdominal pain, blood in stool, constipation  and diarrhea.       No gerd  Genitourinary:  Negative for dysuria.  Musculoskeletal:  Positive for arthralgias. Negative for back pain.  Skin:  Negative for rash.  Neurological:  Negative for light-headedness and headaches.  Psychiatric/Behavioral:  Negative for dysphoric mood. The patient is nervous/anxious.        Objective:   Vitals:   08/23/22 0837  BP: 122/78  Pulse: (!) 55  Temp: 97.9 F (36.6 C)  SpO2: 98%   Filed Weights   08/23/22 0837  Weight: 150 lb (68 kg)   Body mass index is 24.21 kg/m.  BP Readings from Last 3 Encounters:  08/23/22 122/78  05/25/22 130/68  02/15/22 124/72    Wt Readings from Last 3 Encounters:  08/23/22 150 lb (68 kg)  07/20/22 152 lb (68.9 kg)  06/29/22 151 lb (68.5 kg)       Physical Exam Constitutional: She appears well-developed and well-nourished. No distress.  HENT:  Head: Normocephalic and atraumatic.  Right Ear: External ear normal. Normal ear canal and TM Left Ear: External ear normal.  Normal ear canal and TM Mouth/Throat: Oropharynx is clear and moist.  Eyes: Conjunctivae normal.  Neck: Neck supple. No tracheal deviation present. No thyromegaly present.  No carotid bruit  Cardiovascular: Normal rate, regular rhythm and normal heart sounds.  No murmur heard.  No edema. Pulmonary/Chest: Effort normal and breath sounds normal. No respiratory distress. She has no wheezes. She has no rales.  Breast: deferred   Abdominal: Soft. She exhibits no distension. There is no tenderness.  Lymphadenopathy: She has no cervical adenopathy.  Skin: Skin is warm and dry. She is not diaphoretic.  Psychiatric: She has a normal mood and affect. Her behavior is normal.     Lab Results  Component Value Date   WBC 6.1 02/15/2022   HGB 13.0 02/15/2022   HCT 37.6 02/15/2022   PLT 253.0 02/15/2022   GLUCOSE 101 (H) 05/25/2022   CHOL 125 02/15/2022   TRIG 140.0 02/15/2022   HDL 47.50 02/15/2022   LDLDIRECT 87.0 06/12/2018   LDLCALC  49 02/15/2022   ALT 13 02/15/2022   AST 18 02/15/2022   NA 129 (L) 05/25/2022   K 4.2 05/25/2022   CL 94 (L) 05/25/2022   CREATININE 0.84 05/25/2022   BUN 12 05/25/2022   CO2 28 05/25/2022   TSH 1.81 08/20/2021   HGBA1C 5.8 02/15/2022         Assessment & Plan:   Physical exam: Screening blood work  ordered Exercise  some Weight  normal Substance abuse  none   Reviewed recommended immunizations.   Health Maintenance  Topic Date Due   Medicare Annual Wellness (AWV)  10/02/2022   COVID-19 Vaccine (4 - 2023-24 season) 09/08/2022 (Originally 01/15/2022)   Zoster Vaccines- Shingrix (1 of 2) 11/22/2022 (Originally 02/17/1965)   INFLUENZA VACCINE  12/16/2022   DEXA SCAN  08/28/2023   DTaP/Tdap/Td (2 - Td or Tdap) 02/14/2028   COLONOSCOPY (Pts [id number] Insurance coverage will need to be confirmed)  09/10/2030   Pneumonia Vaccine 8165+ Years old  Completed   Hepatitis C Screening  Completed   HPV VACCINES  Aged Out   COLON CANCER SCREENING ANNUAL FOBT  Discontinued          See Problem List for Assessment and Plan of chronic medical problems.

## 2022-08-23 NOTE — Assessment & Plan Note (Addendum)
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

## 2022-08-23 NOTE — Assessment & Plan Note (Signed)
Chronic History of stent following MI in 2008 while living in [place] Continue aspirin 81 mg daily, Crestor 10 mg daily Encouraged regular exercise, healthy diet

## 2022-08-23 NOTE — Assessment & Plan Note (Signed)
Chronic Check a1c Low sugar / carb diet Stressed regular exercise  

## 2022-08-23 NOTE — Assessment & Plan Note (Signed)
Chronic DEXA up-to-date Has osteoporosis Has not tolerated alendronate in the past Discussed high risk of fracture, especially in the hips Deferred other medication at this time Stressed regular exercise Continue calcium and vitamin D supplementation Check vitamin D level

## 2022-08-23 NOTE — Assessment & Plan Note (Signed)
Chronic With osteoporosis Continue vitamin D daily Check vitamin D level

## 2022-08-23 NOTE — Assessment & Plan Note (Signed)
No evidence of recurrence Mammogram up-to-date and has 1 scheduled for this summer.

## 2022-08-23 NOTE — Assessment & Plan Note (Addendum)
Chronic CMP No improvement after hydrochlorothiazide was stopped Lexapro decreased

## 2022-08-23 NOTE — Assessment & Plan Note (Signed)
Neck Blood pressure well-controlled CMP Continue hydrochlorothiazide 12.5 mg daily, losartan 100 mg daily, propranolol 80 mg twice daily

## 2022-08-30 NOTE — Progress Notes (Unsigned)
    [name] D.[facility] [address] [place] [address] Phone: [phone]   Assessment and Plan:     There are no diagnoses linked to this encounter.  ***   Pertinent previous records reviewed include ***   Follow Up: ***     Subjective:    Chief Complaint: ***  HPI:   08/30/22 ***  Relevant Historical Information: ***  Additional pertinent review of systems negative.   Current Outpatient Medications:    aspirin 81 MG tablet, Take 81 mg by mouth daily., Disp: , Rfl:    calcium carbonate (OS-CAL) 600 MG TABS tablet, Take 600 mg by mouth 2 (two) times daily with a meal. , Disp: , Rfl:    Cholecalciferol (EQL VITAMIN D3) 1000 units tablet, Take 1 tablet (1,000 Units total) by mouth daily., Disp: , Rfl:    escitalopram (LEXAPRO) 10 MG tablet, Take 1 tablet (10 mg total) by mouth daily., Disp: 90 tablet, Rfl: 1   hydrochlorothiazide (HYDRODIURIL) 12.5 MG tablet, Take 1 tablet (12.5 mg total) by mouth daily., Disp: 90 tablet, Rfl: 3   losartan (COZAAR) 100 MG tablet, TAKE 1 TABLET(100 MG) BY MOUTH DAILY, Disp: 90 tablet, Rfl: 1   meloxicam (MOBIC) 15 MG tablet, Take 1 tablet (15 mg total) by mouth daily., Disp: 30 tablet, Rfl: 0   Omega-3 Fatty Acids (FISH OIL) 1200 MG CAPS, Take by mouth daily., Disp: , Rfl:    propranolol (INDERAL) 80 MG tablet, Take 1 tablet (80 mg total) by mouth 2 (two) times daily., Disp: 180 tablet, Rfl: 1   rosuvastatin (CRESTOR) 10 MG tablet, Take 1 tablet (10 mg total) by mouth daily., Disp: 90 tablet, Rfl: 1   vitamin k 100 MCG tablet, Take 100 mcg by mouth. Take 1 capsule po every other day for bones, Disp: , Rfl:    Objective:     There were no vitals filed for this visit.    There is no height or weight on file to calculate BMI.    Physical Exam:    ***   Electronically signed by:  [name] D.[facility] 7:40 AM 08/30/22

## 2022-08-31 ENCOUNTER — Ambulatory Visit: Payer: Medicare Other | Admitting: Sports Medicine

## 2022-08-31 VITALS — BP 122/78 | HR 67

## 2022-08-31 DIAGNOSIS — G8929 Other chronic pain: Secondary | ICD-10-CM | POA: Diagnosis not present

## 2022-08-31 DIAGNOSIS — M25561 Pain in right knee: Secondary | ICD-10-CM

## 2022-08-31 DIAGNOSIS — M1711 Unilateral primary osteoarthritis, right knee: Secondary | ICD-10-CM

## 2022-08-31 NOTE — Patient Instructions (Signed)
Good to see you  2 month follow up to repeat zilretta  Follow up sooner if needed

## 2022-09-20 ENCOUNTER — Telehealth: Payer: Self-pay | Admitting: Internal Medicine

## 2022-09-20 NOTE — Telephone Encounter (Signed)
Contacted Sarah Hendricks to schedule their annual wellness visit. Appointment made for 09/23/2022.  [facility] Care [facility] AWV TEAM Direct Dial: [phone]

## 2022-09-22 DIAGNOSIS — L820 Inflamed seborrheic keratosis: Secondary | ICD-10-CM | POA: Diagnosis not present

## 2022-09-22 DIAGNOSIS — L538 Other specified erythematous conditions: Secondary | ICD-10-CM | POA: Diagnosis not present

## 2022-09-22 DIAGNOSIS — Z789 Other specified health status: Secondary | ICD-10-CM | POA: Diagnosis not present

## 2022-09-23 ENCOUNTER — Ambulatory Visit (INDEPENDENT_AMBULATORY_CARE_PROVIDER_SITE_OTHER): Payer: Medicare Other

## 2022-09-23 VITALS — Ht 63.0 in | Wt 150.0 lb

## 2022-09-23 DIAGNOSIS — Z0000 Encounter for general adult medical examination without abnormal findings: Secondary | ICD-10-CM | POA: Diagnosis not present

## 2022-09-23 NOTE — Patient Instructions (Addendum)
Sarah Hendricks , Thank you for taking time to come for your Medicare Wellness Visit. I appreciate your ongoing commitment to your health goals. Please review the following plan we discussed and let me know if I can assist you in the future.   These are the goals we discussed:  Goals      My goal for 2024 is to maintain my health and stay independent.        This is a list of the screening recommended for you and due dates:  Health Maintenance  Topic Date Due   COVID-19 Vaccine (4 - 2023-24 season) 01/15/2022   Zoster (Shingles) Vaccine (1 of 2) 11/22/2022*   Flu Shot  12/16/2022   DEXA scan (bone density measurement)  08/28/2023   Medicare Annual Wellness Visit  09/23/2023   DTaP/Tdap/Td vaccine (2 - Td or Tdap) 02/14/2028   Colon Cancer Screening  09/10/2030   Pneumonia Vaccine  Completed   Hepatitis C Screening: USPSTF Recommendation to screen - Ages 9-79 yo.  Completed   HPV Vaccine  Aged Out   Stool Blood Test  Discontinued  *Topic was postponed. The date shown is not the original due date.    Advanced directives: Yes  Conditions/risks identified: Yes  Next appointment: Follow up in one year for your annual wellness visit.   Preventive Care 32 Years and Older, Female Preventive care refers to lifestyle choices and visits with your health care provider that can promote health and wellness. What does preventive care include? A yearly physical exam. This is also called an annual well check. Dental exams once or twice a year. Routine eye exams. Ask your health care provider how often you should have your eyes checked. Personal lifestyle choices, including: Daily care of your teeth and gums. Regular physical activity. Eating a healthy diet. Avoiding tobacco and drug use. Limiting alcohol use. Practicing safe sex. Taking low-dose aspirin every day. Taking vitamin and mineral supplements as recommended by your health care provider. What happens during an annual well  check? The services and screenings done by your health care provider during your annual well check will depend on your age, overall health, lifestyle risk factors, and family history of disease. Counseling  Your health care provider may ask you questions about your: Alcohol use. Tobacco use. Drug use. Emotional well-being. Home and relationship well-being. Sexual activity. Eating habits. History of falls. Memory and ability to understand (cognition). Work and work Astronomer. Reproductive health. Screening  You may have the following tests or measurements: Height, weight, and BMI. Blood pressure. Lipid and cholesterol levels. These may be checked every 5 years, or more frequently if you are over 23 years old. Skin check. Lung cancer screening. You may have this screening every year starting at age 82 if you have a 30-pack-year history of smoking and currently smoke or have quit within the past 15 years. Fecal occult blood test (FOBT) of the stool. You may have this test every year starting at age 52. Flexible sigmoidoscopy or colonoscopy. You may have a sigmoidoscopy every 5 years or a colonoscopy every 10 years starting at age 15. Hepatitis C blood test. Hepatitis B blood test. Sexually transmitted disease (STD) testing. Diabetes screening. This is done by checking your blood sugar (glucose) after you have not eaten for a while (fasting). You may have this done every 1-3 years. Bone density scan. This is done to screen for osteoporosis. You may have this done starting at age 84. Mammogram. This may be done every  1-2 years. Talk to your health care provider about how often you should have regular mammograms. Talk with your health care provider about your test results, treatment options, and if necessary, the need for more tests. Vaccines  Your health care provider may recommend certain vaccines, such as: Influenza vaccine. This is recommended every year. Tetanus, diphtheria, and  acellular pertussis (Tdap, Td) vaccine. You may need a Td booster every 10 years. Zoster vaccine. You may need this after age 59. Pneumococcal 13-valent conjugate (PCV13) vaccine. One dose is recommended after age 12. Pneumococcal polysaccharide (PPSV23) vaccine. One dose is recommended after age 31. Talk to your health care provider about which screenings and vaccines you need and how often you need them. This information is not intended to replace advice given to you by your health care provider. Make sure you discuss any questions you have with your health care provider. Document Released: 05/30/2015 Document Revised: 01/21/2016 Document Reviewed: 03/04/2015 Elsevier Interactive Patient Education  2017 [employer] Prevention in the Home Falls can cause injuries. They can happen to people of all ages. There are many things you can do to make your home safe and to help prevent falls. What can I do on the outside of my home? Regularly fix the edges of walkways and driveways and fix any cracks. Remove anything that might make you trip as you walk through a door, such as a raised step or threshold. Trim any bushes or trees on the path to your home. Use bright outdoor lighting. Clear any walking paths of anything that might make someone trip, such as rocks or tools. Regularly check to see if handrails are loose or broken. Make sure that both sides of any steps have handrails. Any raised decks and porches should have guardrails on the edges. Have any leaves, snow, or ice cleared regularly. Use sand or salt on walking paths during winter. Clean up any spills in your garage right away. This includes oil or grease spills. What can I do in the bathroom? Use night lights. Install grab bars by the toilet and in the tub and shower. Do not use towel bars as grab bars. Use non-skid mats or decals in the tub or shower. If you need to sit down in the shower, use a plastic, non-slip stool. Keep  the floor dry. Clean up any water that spills on the floor as soon as it happens. Remove soap buildup in the tub or shower regularly. Attach bath mats securely with double-sided non-slip rug tape. Do not have throw rugs and other things on the floor that can make you trip. What can I do in the bedroom? Use night lights. Make sure that you have a light by your bed that is easy to reach. Do not use any sheets or blankets that are too big for your bed. They should not hang down onto the floor. Have a firm chair that has side arms. You can use this for support while you get dressed. Do not have throw rugs and other things on the floor that can make you trip. What can I do in the kitchen? Clean up any spills right away. Avoid walking on wet floors. Keep items that you use a lot in easy-to-reach places. If you need to reach something above you, use a strong step stool that has a grab bar. Keep electrical cords out of the way. Do not use floor polish or wax that makes floors slippery. If you must use wax, use non-skid  floor wax. Do not have throw rugs and other things on the floor that can make you trip. What can I do with my stairs? Do not leave any items on the stairs. Make sure that there are handrails on both sides of the stairs and use them. Fix handrails that are broken or loose. Make sure that handrails are as long as the stairways. Check any carpeting to make sure that it is firmly attached to the stairs. Fix any carpet that is loose or worn. Avoid having throw rugs at the top or bottom of the stairs. If you do have throw rugs, attach them to the floor with carpet tape. Make sure that you have a light switch at the top of the stairs and the bottom of the stairs. If you do not have them, ask someone to add them for you. What else can I do to help prevent falls? Wear shoes that: Do not have high heels. Have rubber bottoms. Are comfortable and fit you well. Are closed at the toe. Do not  wear sandals. If you use a stepladder: Make sure that it is fully opened. Do not climb a closed stepladder. Make sure that both sides of the stepladder are locked into place. Ask someone to hold it for you, if possible. Clearly mark and make sure that you can see: Any grab bars or handrails. First and last steps. Where the edge of each step is. Use tools that help you move around (mobility aids) if they are needed. These include: Canes. Walkers. Scooters. Crutches. Turn on the lights when you go into a dark area. Replace any light bulbs as soon as they burn out. Set up your furniture so you have a clear path. Avoid moving your furniture around. If any of your floors are uneven, fix them. If there are any pets around you, be aware of where they are. Review your medicines with your doctor. Some medicines can make you feel dizzy. This can increase your chance of falling. Ask your doctor what other things that you can do to help prevent falls. This information is not intended to replace advice given to you by your health care provider. Make sure you discuss any questions you have with your health care provider. Document Released: 02/27/2009 Document Revised: 10/09/2015 Document Reviewed: 06/07/2014 Elsevier Interactive Patient Education  2017 [employer].

## 2022-09-23 NOTE — Progress Notes (Signed)
I connected with  [name] on 09/23/22 by a audio enabled telemedicine application and verified that I am speaking with the correct person using two identifiers.  Patient Location: Home  Provider Location: Office/Clinic  I discussed the limitations of evaluation and management by telemedicine. The patient expressed understanding and agreed to proceed.  Subjective:   Sarah Hendricks is a 77 y.o. female who presents for Medicare Annual (Subsequent) preventive examination.  Review of Systems     Cardiac Risk Factors include: advanced age (>53men, >71 women);dyslipidemia;family history of premature cardiovascular disease;hypertension     Objective:    Today's Vitals   09/23/22 1303  Weight: 150 lb (68 kg)  Height: 5\' 3"  (1.6 m)  PainSc: 0-No pain   Body mass index is 26.57 kg/m.     09/23/2022    1:05 PM 10/01/2021    8:56 AM 09/29/2020    8:24 AM  Advanced Directives  Does Patient Have a Medical Advance Directive? Yes Yes No  Type of Estate agent of [place];Living will Living will   Does patient want to make changes to medical advance directive?  No - Patient declined   Copy of Healthcare Power of Attorney in Chart? No - copy requested    Would patient like information on creating a medical advance directive?   No - Patient declined    Current Medications (verified) Outpatient Encounter Medications as of 09/23/2022  Medication Sig   aspirin 81 MG tablet Take 81 mg by mouth daily.   calcium carbonate (OS-CAL) 600 MG TABS tablet Take 600 mg by mouth 2 (two) times daily with a meal.    Cholecalciferol (EQL VITAMIN D3) 1000 units tablet Take 1 tablet (1,000 Units total) by mouth daily.   escitalopram (LEXAPRO) 10 MG tablet Take 1 tablet (10 mg total) by mouth daily.   hydrochlorothiazide (HYDRODIURIL) 12.5 MG tablet Take 1 tablet (12.5 mg total) by mouth daily.   losartan (COZAAR) 100 MG tablet TAKE 1 TABLET(100 MG) BY MOUTH DAILY   meloxicam  (MOBIC) 15 MG tablet Take 1 tablet (15 mg total) by mouth daily.   Omega-3 Fatty Acids (FISH OIL) 1200 MG CAPS Take by mouth daily.   propranolol (INDERAL) 80 MG tablet Take 1 tablet (80 mg total) by mouth 2 (two) times daily.   rosuvastatin (CRESTOR) 10 MG tablet Take 1 tablet (10 mg total) by mouth daily.   vitamin k 100 MCG tablet Take 100 mcg by mouth. Take 1 capsule po every other day for bones   No facility-administered encounter medications on file as of 09/23/2022.    Allergies (verified) Acetaminophen-codeine, Tigan [trimethobenzamide], Alendronate, Atorvastatin, and Codeine   History: Past Medical History:  Diagnosis Date   Allergy    Arthritis    Breast cancer (HCC) 1994   lumpectomy and XRT, no chemo   CAD (coronary artery disease) 2008   [place] in [place]   GERD (gastroesophageal reflux disease)    History of chicken pox    Hyperlipidemia    Hypertension    Myocardial infarction (HCC) 03/15/2007   stent placed   Vitamin D deficiency    Past Surgical History:  Procedure Laterality Date   BREAST BIOPSY Left 1994   BREAST LUMPECTOMY Left 1994   CATARACT EXTRACTION, BILATERAL     CORONARY ANGIOPLASTY WITH STENT PLACEMENT  2008   CRANIECTOMY FOR DEPRESSED SKULL FRACTURE  1986   TONSILLECTOMY  1966   Family History  Problem Relation Age of Onset   Breast cancer Mother  Hypertension Mother    Heart disease Mother    Stroke Mother    Arthritis Father    Heart disease Father        died at 57 of heart attack   Heart disease Brother    Stroke Maternal Grandmother    Heart disease Maternal Grandfather    Colon cancer Neg Hx    Esophageal cancer Neg Hx    Rectal cancer Neg Hx    Stomach cancer Neg Hx    Social History   Socioeconomic History   Marital status: Married    Spouse name: Not on file   Number of children: Not on file   Years of education: Not on file   Highest education level: Not on file  Occupational History   Not on file  Tobacco Use    Smoking status: Never   Smokeless tobacco: Never  Vaping Use   Vaping Use: Never used  Substance and Sexual Activity   Alcohol use: No   Drug use: No   Sexual activity: Not on file  Other Topics Concern   Not on file  Social History Narrative   Married, lives with spouse   Retired from [employer] irregularly, rides an exercise bike   Social Determinants of Health   Financial Resource Strain: Low Risk  (09/23/2022)   Overall Financial Resource Strain (CARDIA)    Difficulty of Paying Living Expenses: Not hard at all  Food Insecurity: No Food Insecurity (09/23/2022)   Hunger Vital Sign    Worried About Running Out of Food in the Last Year: Never true    Ran Out of Food in the Last Year: Never true  Transportation Needs: No Transportation Needs (09/23/2022)   PRAPARE - Administrator, Civil Service (Medical): No    Lack of Transportation (Non-Medical): No  Physical Activity: Sufficiently Active (09/23/2022)   Exercise Vital Sign    Days of Exercise per Week: 5 days    Minutes of Exercise per Session: 30 min  Stress: No Stress Concern Present (09/23/2022)   [employer] of Occupational Health - Occupational Stress Questionnaire    Feeling of Stress : Not at all  Social Connections: Socially Integrated (09/23/2022)   Social Connection and Isolation Panel [NHANES]    Frequency of Communication with Friends and Family: More than three times a week    Frequency of Social Gatherings with Friends and Family: Twice a week    Attends Religious Services: More than 4 times per year    Active Member of [employer] or Organizations: No    Attends Engineer, structural: More than 4 times per year    Marital Status: Married    Tobacco Counseling Counseling given: Not Answered   Clinical Intake:  Pre-visit preparation completed: Yes  Pain : No/denies pain Pain Score: 0-No pain     BMI - recorded: 26.57 Nutritional Status: BMI 25 -29 Overweight Nutritional  Risks: None Diabetes: No  How often do you need to have someone help you when you read instructions, pamphlets, or other written materials from your doctor or pharmacy?: 1 - Never What is the last grade level you completed in school?: HSG  Diabetic? No  Interpreter Needed?: No  Information entered by :: Susie Cassette, LPN.   Activities of Daily Living    09/23/2022    1:08 PM 10/01/2021    9:07 AM  In your present state of health, do you have any difficulty performing the  following activities:  Hearing? 0 0  Vision? 0 0  Difficulty concentrating or making decisions? 0 0  Walking or climbing stairs? 0 0  Dressing or bathing? 0 0  Doing errands, shopping? 0 0  Preparing Food and eating ? N N  Using the Toilet? N N  In the past six months, have you accidently leaked urine? N N  Do you have problems with loss of bowel control? N N  Managing your Medications? N N  Managing your Finances? N N  Housekeeping or managing your Housekeeping? N N    Patient Care Team: Pincus Sanes, MD as PCP - General (Internal Medicine) Lars Masson, MD as PCP - Cardiology (Cardiology) Genia Del Daisy Blossom, MD as Consulting Physician (Ophthalmology) Szabat, Vinnie Level, [facility] (Inactive) as Pharmacist (Pharmacist)  Indicate any recent Medical Services you may have received from other than [facility] providers in the past year (date may be approximate).     Assessment:   This is a routine wellness examination for Taijah.  Hearing/Vision screen Hearing Screening - Comments:: Denies hearing difficulties   Vision Screening - Comments:: Wears rx glasses - up to date with routine eye exams with Mack Hook, MD.   Dietary issues and exercise activities discussed: Current Exercise Habits: Home exercise routine, Type of exercise: walking, Time (Minutes): 30, Frequency (Times/Week): 5, Weekly Exercise (Minutes/Week): 150, Intensity: Moderate, Exercise limited by: None identified   Goals Addressed              This Visit's Progress    My goal for 2024 is to maintain my health and stay independent.        Depression Screen    09/23/2022    1:08 PM 08/23/2022    8:40 AM 05/25/2022    9:16 AM 02/15/2022    9:20 AM 02/15/2022    9:08 AM 10/01/2021    9:01 AM 12/23/2020    9:07 AM  PHQ 2/9 Scores  PHQ - 2 Score 0 0 0 0 0 0 0  PHQ- 9 Score 0  0    0    Fall Risk    09/23/2022    1:06 PM 08/23/2022    8:40 AM 05/25/2022    9:15 AM 02/15/2022    9:20 AM 02/15/2022    9:08 AM  Fall Risk   Falls in the past year? 0 0 0 0 0  Number falls in past yr: 0 0 0 0 0  Injury with Fall? 0 0 0 0 0  Risk for fall due to : No Fall Risks No Fall Risks No Fall Risks No Fall Risks No Fall Risks  Follow up Falls prevention discussed Falls evaluation completed Falls evaluation completed Falls evaluation completed Falls evaluation completed    FALL RISK PREVENTION PERTAINING TO THE HOME:  Any stairs in or around the home? Yes  If so, are there any without handrails? No  Home free of loose throw rugs in walkways, pet beds, electrical cords, etc? Yes  Adequate lighting in your home to reduce risk of falls? Yes   ASSISTIVE DEVICES UTILIZED TO PREVENT FALLS:  Life alert? No  Use of a cane, walker or w/c? No  Grab bars in the bathroom? Yes  Shower chair or bench in shower? No  Elevated toilet seat or a handicapped toilet? Yes   TIMED UP AND GO:  Was the test performed? No . Telephonic Visit  Cognitive Function:        09/23/2022    1:06  PM 10/01/2021    9:08 AM  6CIT Screen  What Year? 0 points 0 points  What month? 0 points 0 points  What time? 0 points 0 points  Count back from 20 0 points 0 points  Months in reverse 0 points 0 points  Repeat phrase 0 points 0 points  Total Score 0 points 0 points    Immunizations Immunization History  Administered Date(s) Administered   Fluad Quad(high Dose 65+) 02/15/2019, 02/28/2020, 02/09/2021, 02/15/2022   Influenza, High Dose Seasonal PF  02/15/2017, 02/13/2018   Influenza-Unspecified 03/17/2014, 02/16/2015, 02/29/2016   Moderna Sars-Covid-2 Vaccination 07/13/2019, 08/10/2019, 05/22/2020   Pneumococcal Conjugate-13 05/30/2014   Pneumococcal Polysaccharide-23 09/26/2013   Tdap 02/13/2018    TDAP status: Up to date  Flu Vaccine status: Up to date  Pneumococcal vaccine status: Up to date  Covid-19 vaccine status: Completed vaccines  Qualifies for Shingles Vaccine? Yes   Zostavax completed No   Shingrix Completed?: No.    Education has been provided regarding the importance of this vaccine. Patient has been advised to call insurance company to determine out of pocket expense if they have not yet received this vaccine. Advised may also receive vaccine at local pharmacy or Health Dept. Verbalized acceptance and understanding.  Screening Tests Health Maintenance  Topic Date Due   COVID-19 Vaccine (4 - 2023-24 season) 01/15/2022   Zoster Vaccines- Shingrix (1 of 2) 11/22/2022 (Originally 02/17/1965)   INFLUENZA VACCINE  12/16/2022   DEXA SCAN  08/28/2023   Medicare Annual Wellness (AWV)  09/23/2023   DTaP/Tdap/Td (2 - Td or Tdap) 02/14/2028   COLONOSCOPY (Pts 45-72yrs Insurance coverage will need to be confirmed)  09/10/2030   Pneumonia Vaccine 26+ Years old  Completed   Hepatitis C Screening  Completed   HPV VACCINES  Aged Out   COLON CANCER SCREENING ANNUAL FOBT  Discontinued    Health Maintenance  Health Maintenance Due  Topic Date Due   COVID-19 Vaccine (4 - 2023-24 season) 01/15/2022    Colorectal cancer screening: Type of screening: Colonoscopy. Completed 09/09/2020. Repeat every 10 years  Mammogram status: Completed 11/23/2021. Repeat every year  Bone Density status: Completed 08/27/2021. Results reflect: Bone density results: OSTEOPOROSIS. Repeat every 2 years.  Lung Cancer Screening: (Low Dose CT Chest recommended if Age 27-80 years, 30 pack-year currently smoking OR have quit w/in 15years.) does not  qualify.   Lung Cancer Screening Referral: No  Additional Screening:  Hepatitis C Screening: does qualify; Completed 05/21/2016  Vision Screening: Recommended annual ophthalmology exams for early detection of glaucoma and other disorders of the eye. Is the patient up to date with their annual eye exam?  Yes  Who is the provider or what is the name of the office in which the patient attends annual eye exams? Mack Hook, MD. If pt is not established with a provider, would they like to be referred to a provider to establish care? No .   Dental Screening: Recommended annual dental exams for proper oral hygiene  Community Resource Referral / Chronic Care Management: CRR required this visit?  No   CCM required this visit?  No      Plan:     I have personally reviewed and noted the following in the patient's chart:   Medical and social history Use of alcohol, tobacco or illicit drugs  Current medications and supplements including opioid prescriptions. Patient is not currently taking opioid prescriptions. Functional ability and status Nutritional status Physical activity Advanced directives List of other physicians Hospitalizations,  surgeries, and ER visits in previous 12 months Vitals Screenings to include cognitive, depression, and falls Referrals and appointments  In addition, I have reviewed and discussed with patient certain preventive protocols, quality metrics, and best practice recommendations. A written personalized care plan for preventive services as well as general preventive health recommendations were provided to patient.     Mickeal Needy, LPN   4/0/9811   Nurse Notes: Normal cognitive status assessed by direct observation via telephone conversation by this Nurse Health Advisor. No abnormalities found.

## 2022-10-18 NOTE — Progress Notes (Unsigned)
Cardiology Office Note:    Date:  10/18/2022   ID:  Sarah Hendricks, DOB 03/10/1946, MRN [id number]  PCP:  Pincus Sanes, MD   [facility] Providers Cardiologist:  Tobias Alexander, MD {     Referring MD: Pincus Sanes, MD    History of Present Illness:    Sarah Hendricks is a 77 y.o. female with a hx of HTN, HLD, CAD s/p PCI in 2008, and breast cancer s/p lumpectomy and XRT (no chemo) who was previously followed by Dr. Delton See who now returns to clinic for follow-up.   Underwent exercise stress test in 2018 which was negative for ischemia.  Was last seen in clinic on 10/2021 where she was doing well from a CV standpoint.  Today, ***  Past Medical History:  Diagnosis Date   Allergy    Arthritis    Breast cancer (HCC) 1994   lumpectomy and XRT, no chemo   CAD (coronary artery disease) 2008   [place], [place]   GERD (gastroesophageal reflux disease)    History of chicken pox    Hyperlipidemia    Hypertension    Myocardial infarction (HCC) 03/15/2007   stent placed   Vitamin D deficiency     Past Surgical History:  Procedure Laterality Date   BREAST BIOPSY Left 1994   BREAST LUMPECTOMY Left 1994   CATARACT EXTRACTION, BILATERAL     CORONARY ANGIOPLASTY WITH STENT PLACEMENT  2008   CRANIECTOMY FOR DEPRESSED SKULL FRACTURE  1986   TONSILLECTOMY  1966    Current Medications: No outpatient medications have been marked as taking for the 10/26/22 encounter (Appointment) with Meriam Sprague, MD.     Allergies:   Acetaminophen-codeine, Tigan [trimethobenzamide], Alendronate, Atorvastatin, and Codeine   Social History   Socioeconomic History   Marital status: Married    Spouse name: Not on file   Number of children: Not on file   Years of education: Not on file   Highest education level: Not on file  Occupational History   Not on file  Tobacco Use   Smoking status: Never   Smokeless tobacco: Never  Vaping Use   Vaping Use: Never used  Substance and  Sexual Activity   Alcohol use: No   Drug use: No   Sexual activity: Not on file  Other Topics Concern   Not on file  Social History Narrative   Married, lives with spouse   Retired from [employer] irregularly, rides an exercise bike   Social Determinants of Health   Financial Resource Strain: Low Risk  (09/23/2022)   Overall Financial Resource Strain (CARDIA)    Difficulty of Paying Living Expenses: Not hard at all  Food Insecurity: No Food Insecurity (09/23/2022)   Hunger Vital Sign    Worried About Running Out of Food in the Last Year: Never true    Ran Out of Food in the Last Year: Never true  Transportation Needs: No Transportation Needs (09/23/2022)   PRAPARE - Administrator, Civil Service (Medical): No    Lack of Transportation (Non-Medical): No  Physical Activity: Sufficiently Active (09/23/2022)   Exercise Vital Sign    Days of Exercise per Week: 5 days    Minutes of Exercise per Session: 30 min  Stress: No Stress Concern Present (09/23/2022)   [employer] of Occupational Health - Occupational Stress Questionnaire    Feeling of Stress : Not at all  Social Connections: Socially Integrated (09/23/2022)  Social Advertising account executive [NHANES]    Frequency of Communication with Friends and Family: More than three times a week    Frequency of Social Gatherings with Friends and Family: Twice a week    Attends Religious Services: More than 4 times per year    Active Member of [employer] or Organizations: No    Attends Engineer, structural: More than 4 times per year    Marital Status: Married     Family History: The patient's family history includes Arthritis in her father; Breast cancer in her mother; Heart disease in her brother, father, maternal grandfather, and mother; Hypertension in her mother; Stroke in her maternal grandmother and mother. There is no history of Colon cancer, Esophageal cancer, Rectal cancer, or Stomach  cancer.  ROS:   Please see the history of present illness.    All other systems reviewed and are negative.  EKGs/Labs/Other Studies Reviewed:    The following studies were reviewed today: Cardiac Studies & Procedures     STRESS TESTS  EXERCISE TOLERANCE TEST (ETT) 06/01/2016  Narrative  The patient walked for 6:16 of a Bruce protocol. Peak HR of 141 which is 93% predicted maxmial HR.  There were no ST or T wave changes to suggest ischemia.  BP response to exercise was normal.               EKG: ***  Recent Labs: 08/23/2022: ALT 15; BUN 11; Creatinine, Ser 0.86; Hemoglobin 13.0; Platelets 266.0; Potassium 3.7; Sodium 125; TSH 1.17  Recent Lipid Panel    Component Value Date/Time   CHOL 143 08/23/2022 0916   TRIG 69.0 08/23/2022 0916   HDL 77.70 08/23/2022 0916   CHOLHDL 2 08/23/2022 0916   VLDL 13.8 08/23/2022 0916   LDLCALC 51 08/23/2022 0916   LDLDIRECT 87.0 06/12/2018 0946     Risk Assessment/Calculations:           Physical Exam:    VS:  There were no vitals taken for this visit.    Wt Readings from Last 3 Encounters:  09/23/22 150 lb (68 kg)  08/23/22 150 lb (68 kg)  07/20/22 152 lb (68.9 kg)     GEN:  Well nourished, well developed in no acute distress HEENT: Normal NECK: No JVD; No carotid bruits CARDIAC: Bradycardic, regular. No murmurs, rubs, gallops RESPIRATORY:  Clear to auscultation without rales, wheezing or rhonchi  ABDOMEN: Soft, non-tender, non-distended MUSCULOSKELETAL:  No edema; No deformity  SKIN: Warm and dry NEUROLOGIC:  Alert and oriented x 3 PSYCHIATRIC:  Normal affect   ASSESSMENT:    No diagnosis found.  PLAN:    In order of problems listed above:  #Tremors: Improved. Now on propranolol.  -Continue propranolol 80mg  BID    #CAD s/p PCI in 2008: Doing well without anginal symptoms. Patient remains active.  -Continue ASA 81mg  daily -Continue crestor 10mg  daily -Continue losartan 100mg  daily -Changed coreg to  propranolol for tremors as above   #HTN: -Continue losartan 100mg  daily -Continue propranolol 80mg  BID -Continue HCTZ 12.5mg  daily -BP well controlled and at goal <120/80s at home   #HLD: LDL 52. Goal <70. -Continue crestor 10mg  daily  #HypoNa: Stable. Monitored by PCP.            Medication Adjustments/Labs and Tests Ordered: Current medicines are reviewed at length with the patient today.  Concerns regarding medicines are outlined above.  No orders of the defined types were placed in this encounter.  No orders of the defined types  were placed in this encounter.   There are no Patient Instructions on file for this visit.

## 2022-10-26 ENCOUNTER — Encounter: Payer: Self-pay | Admitting: Cardiology

## 2022-10-26 ENCOUNTER — Ambulatory Visit: Payer: Medicare Other | Attending: Cardiology | Admitting: Cardiology

## 2022-10-26 VITALS — BP 122/72 | HR 57 | Ht 63.0 in | Wt 152.2 lb

## 2022-10-26 DIAGNOSIS — I10 Essential (primary) hypertension: Secondary | ICD-10-CM

## 2022-10-26 DIAGNOSIS — E782 Mixed hyperlipidemia: Secondary | ICD-10-CM

## 2022-10-26 DIAGNOSIS — R251 Tremor, unspecified: Secondary | ICD-10-CM

## 2022-10-26 DIAGNOSIS — I2510 Atherosclerotic heart disease of native coronary artery without angina pectoris: Secondary | ICD-10-CM

## 2022-10-26 MED ORDER — AMLODIPINE BESYLATE 5 MG PO TABS: 5.0000 mg | ORAL_TABLET | Freq: Every day | ORAL | 3 refills | Status: DC

## 2022-10-26 NOTE — Patient Instructions (Signed)
Medication Instructions:  Your physician has recommended you make the following change in your medication:   Stop taking HCTZ Start taking amlodipine 5 mg daily  *If you need a refill on your cardiac medications before your next appointment, please call your pharmacy*   Lab Work: BMET at [facility] 1-3 weeks If you have labs (blood work) drawn today and your tests are completely normal, you will receive your results only by: MyChart Message (if you have MyChart) OR A paper copy in the mail If you have any lab test that is abnormal or we need to change your treatment, we will call you to review the results.   Testing/Procedures: ECHO 1-5 months Your physician has requested that you have an echocardiogram. Echocardiography is a painless test that uses sound waves to create images of your heart. It provides your doctor with information about the size and shape of your heart and how well your heart's chambers and valves are working. This procedure takes approximately one hour. There are no restrictions for this procedure. Please do NOT wear cologne, perfume, aftershave, or lotions (deodorant is allowed). Please arrive 15 minutes prior to your appointment time.    Follow-Up: At [facility], you and your health needs are our priority.  As part of our continuing mission to provide you with exceptional heart care, we have created designated Provider Care Teams.  These Care Teams include your primary Cardiologist (physician) and Advanced Practice Providers (APPs -  Physician Assistants and Nurse Practitioners) who all work together to provide you with the care you need, when you need it.    Your next appointment:   1 year(s)  Provider:   Dr Cristal Deer

## 2022-10-27 ENCOUNTER — Other Ambulatory Visit: Payer: Self-pay | Admitting: Cardiology

## 2022-10-27 ENCOUNTER — Other Ambulatory Visit: Payer: Self-pay | Admitting: *Deleted

## 2022-10-27 DIAGNOSIS — I2510 Atherosclerotic heart disease of native coronary artery without angina pectoris: Secondary | ICD-10-CM | POA: Diagnosis not present

## 2022-10-27 DIAGNOSIS — R251 Tremor, unspecified: Secondary | ICD-10-CM | POA: Diagnosis not present

## 2022-10-27 DIAGNOSIS — E782 Mixed hyperlipidemia: Secondary | ICD-10-CM | POA: Diagnosis not present

## 2022-10-27 DIAGNOSIS — I10 Essential (primary) hypertension: Secondary | ICD-10-CM | POA: Diagnosis not present

## 2022-10-27 MED ORDER — LOSARTAN POTASSIUM 100 MG PO TABS: ORAL_TABLET | ORAL | 3 refills | Status: DC

## 2022-10-27 MED ORDER — ROSUVASTATIN CALCIUM 10 MG PO TABS: 10.0000 mg | ORAL_TABLET | Freq: Every day | ORAL | 3 refills | Status: DC

## 2022-10-27 MED ORDER — PROPRANOLOL HCL 80 MG PO TABS: 80.0000 mg | ORAL_TABLET | Freq: Two times a day (BID) | ORAL | 3 refills | Status: DC

## 2022-10-28 ENCOUNTER — Encounter: Payer: Self-pay | Admitting: Cardiology

## 2022-10-28 LAB — BASIC METABOLIC PANEL
BUN/Creatinine Ratio: 14 (ref 12–28)
BUN: 13 mg/dL (ref 8–27)
CO2: 25 mmol/L (ref 20–29)
Calcium: 9.1 mg/dL (ref 8.7–10.3)
Chloride: 95 mmol/L — ABNORMAL LOW (ref 96–106)
Creatinine, Ser: 0.93 mg/dL (ref 0.57–1.00)
Glucose: 98 mg/dL (ref 70–99)
Potassium: 4.2 mmol/L (ref 3.5–5.2)
Sodium: 133 mmol/L — ABNORMAL LOW (ref 134–144)
eGFR: 64 mL/min/{1.73_m2} (ref >59)

## 2022-11-01 NOTE — Progress Notes (Unsigned)
    Sarah Hendricks D.[facility] [address] [place] [address] Phone: [phone]   Assessment and Plan:     There are no diagnoses linked to this encounter.  ***   Pertinent previous records reviewed include ***   Follow Up: ***     Subjective:    Chief Complaint: ***  HPI:   11/01/22 ***  Relevant Historical Information: ***  Additional pertinent review of systems negative.   Current Outpatient Medications:    amLODipine (NORVASC) 5 MG tablet, Take 1 tablet (5 mg total) by mouth daily., Disp: 180 tablet, Rfl: 3   aspirin 81 MG tablet, Take 81 mg by mouth daily., Disp: , Rfl:    calcium carbonate (OS-CAL) 600 MG TABS tablet, Take 600 mg by mouth 2 (two) times daily with a meal. , Disp: , Rfl:    Cholecalciferol (EQL VITAMIN D3) 1000 units tablet, Take 1 tablet (1,000 Units total) by mouth daily., Disp: , Rfl:    escitalopram (LEXAPRO) 10 MG tablet, Take 1 tablet (10 mg total) by mouth daily., Disp: 90 tablet, Rfl: 1   losartan (COZAAR) 100 MG tablet, TAKE 1 TABLET(100 MG) BY MOUTH DAILY, Disp: 90 tablet, Rfl: 3   meloxicam (MOBIC) 15 MG tablet, Take 1 tablet (15 mg total) by mouth daily., Disp: 30 tablet, Rfl: 0   Omega-3 Fatty Acids (FISH OIL) 1200 MG CAPS, Take by mouth daily., Disp: , Rfl:    propranolol (INDERAL) 80 MG tablet, Take 1 tablet (80 mg total) by mouth 2 (two) times daily., Disp: 180 tablet, Rfl: 3   rosuvastatin (CRESTOR) 10 MG tablet, Take 1 tablet (10 mg total) by mouth daily., Disp: 90 tablet, Rfl: 3   vitamin k 100 MCG tablet, Take 100 mcg by mouth. Take 1 capsule po every other day for bones, Disp: , Rfl:    Objective:     There were no vitals filed for this visit.    There is no height or weight on file to calculate BMI.    Physical Exam:    ***   Electronically signed by:  Sarah Hendricks D.[facility] 10:23 AM 11/01/22

## 2022-11-02 ENCOUNTER — Ambulatory Visit (INDEPENDENT_AMBULATORY_CARE_PROVIDER_SITE_OTHER): Payer: Medicare Other

## 2022-11-02 ENCOUNTER — Ambulatory Visit: Payer: Medicare Other | Admitting: Sports Medicine

## 2022-11-02 ENCOUNTER — Encounter: Payer: Self-pay | Admitting: Cardiology

## 2022-11-02 VITALS — Ht 63.0 in | Wt 152.0 lb

## 2022-11-02 DIAGNOSIS — M1711 Unilateral primary osteoarthritis, right knee: Secondary | ICD-10-CM

## 2022-11-02 DIAGNOSIS — G8929 Other chronic pain: Secondary | ICD-10-CM

## 2022-11-02 DIAGNOSIS — M25562 Pain in left knee: Secondary | ICD-10-CM

## 2022-11-02 DIAGNOSIS — M25561 Pain in right knee: Secondary | ICD-10-CM

## 2022-11-02 MED ORDER — TRIAMCINOLONE ACETONIDE 32 MG IX SRER
32.0000 mg | Freq: Once | INTRA_ARTICULAR | Status: AC
  Administered 2022-11-02: 32 mg via INTRA_ARTICULAR

## 2022-11-02 NOTE — Patient Instructions (Signed)
4 week follow up

## 2022-11-08 DIAGNOSIS — H353131 Nonexudative age-related macular degeneration, bilateral, early dry stage: Secondary | ICD-10-CM | POA: Diagnosis not present

## 2022-11-09 DIAGNOSIS — H524 Presbyopia: Secondary | ICD-10-CM | POA: Diagnosis not present

## 2022-11-22 ENCOUNTER — Other Ambulatory Visit: Payer: Self-pay | Admitting: *Deleted

## 2022-11-22 DIAGNOSIS — M79605 Pain in left leg: Secondary | ICD-10-CM

## 2022-11-25 ENCOUNTER — Ambulatory Visit
Admission: RE | Admit: 2022-11-25 | Discharge: 2022-11-25 | Disposition: A | Discharge status: Home / Self Care | Payer: Medicare Other | Source: Ambulatory Visit | Attending: Internal Medicine | Admitting: Internal Medicine

## 2022-11-25 DIAGNOSIS — Z1231 Encounter for screening mammogram for malignant neoplasm of breast: Secondary | ICD-10-CM | POA: Diagnosis not present

## 2022-11-26 MED ORDER — AMLODIPINE BESYLATE 5 MG PO TABS: 5.0000 mg | ORAL_TABLET | Freq: Every day | ORAL | 3 refills | Status: DC

## 2022-11-26 MED ORDER — LOSARTAN POTASSIUM 100 MG PO TABS: ORAL_TABLET | ORAL | 3 refills | Status: DC

## 2022-11-26 MED ORDER — PROPRANOLOL HCL 80 MG PO TABS: 80.0000 mg | ORAL_TABLET | Freq: Two times a day (BID) | ORAL | 3 refills | Status: DC

## 2022-11-26 MED ORDER — ROSUVASTATIN CALCIUM 10 MG PO TABS: 10.0000 mg | ORAL_TABLET | Freq: Every day | ORAL | 3 refills | Status: DC

## 2022-11-29 NOTE — Progress Notes (Unsigned)
Sarah Hendricks D.[facility] [address] [place] [address] Phone: [phone]   Assessment and Plan:     There are no diagnoses linked to this encounter.  ***   Pertinent previous records reviewed include ***   Follow Up: ***     Subjective:   I, [name], am serving as a Neurosurgeon for Doctor [employer]   Chief Complaint: left hip , leg, and right knee pain    HPI:  , Sarah Hendricks, am serving as a Neurosurgeon for Doctor [employer]   Chief Complaint: left hip ,leg, and right knee pain    HPI:    06/01/2022 Patient is a 77 year old female complaining of left hip, leg and right knee pain . Patient states she has had 3 falls , has fallen on the same side each time, pain radiates down her leg, pain has been there for months , burns thinks it could be bursitis , pain increases when she lays on that side, she is a side sleeper    Knee- pain in the back of the knee , been going on for a couple of months   Ib and tylenol every now and then, kind of sorta helps,  no numbness or tingling    06/29/2022 Patient states that she still has pain , once she stopped meloxicam the pain came back    07/20/2022 Patient states that she is pretty good, hip is much better, knee is so so   08/03/2022 Patient states   08/31/2022 Patient has pain in the front of the knee     11/01/22 Is stating that her left  knee is starting to hurt   11/30/2022 Patient states   Relevant Historical Information: Hypertension, osteoporosis, prediabetes    Additional pertinent review of systems negative.   Current Outpatient Medications:    amLODipine (NORVASC) 5 MG tablet, Take 1 tablet (5 mg total) by mouth daily., Disp: 90 tablet, Rfl: 3   aspirin 81 MG tablet, Take 81 mg by mouth daily., Disp: , Rfl:    calcium carbonate (OS-CAL) 600 MG TABS tablet, Take 600 mg by mouth 2 (two) times daily with a meal. , Disp: , Rfl:    Cholecalciferol (EQL  VITAMIN D3) 1000 units tablet, Take 1 tablet (1,000 Units total) by mouth daily., Disp: , Rfl:    escitalopram (LEXAPRO) 10 MG tablet, Take 1 tablet (10 mg total) by mouth daily., Disp: 90 tablet, Rfl: 1   losartan (COZAAR) 100 MG tablet, TAKE 1 TABLET(100 MG) BY MOUTH DAILY, Disp: 90 tablet, Rfl: 3   meloxicam (MOBIC) 15 MG tablet, Take 1 tablet (15 mg total) by mouth daily., Disp: 30 tablet, Rfl: 0   Omega-3 Fatty Acids (FISH OIL) 1200 MG CAPS, Take by mouth daily., Disp: , Rfl:    propranolol (INDERAL) 80 MG tablet, Take 1 tablet (80 mg total) by mouth 2 (two) times daily., Disp: 180 tablet, Rfl: 3   rosuvastatin (CRESTOR) 10 MG tablet, Take 1 tablet (10 mg total) by mouth daily., Disp: 90 tablet, Rfl: 3   vitamin k 100 MCG tablet, Take 100 mcg by mouth. Take 1 capsule po every other day for bones, Disp: , Rfl:    Objective:     There were no vitals filed for this visit.    There is no height or weight on file to calculate BMI.    Physical Exam:    ***   Electronically signed by:  Romeo Apple  Leo [facility] 2:14 PM 11/29/22

## 2022-11-30 ENCOUNTER — Ambulatory Visit: Payer: Medicare Other | Admitting: Sports Medicine

## 2022-11-30 VITALS — HR 60 | Ht 63.0 in | Wt 152.0 lb

## 2022-11-30 DIAGNOSIS — M25511 Pain in right shoulder: Secondary | ICD-10-CM | POA: Diagnosis not present

## 2022-11-30 DIAGNOSIS — M1711 Unilateral primary osteoarthritis, right knee: Secondary | ICD-10-CM

## 2022-11-30 DIAGNOSIS — M25561 Pain in right knee: Secondary | ICD-10-CM

## 2022-11-30 DIAGNOSIS — G8929 Other chronic pain: Secondary | ICD-10-CM | POA: Diagnosis not present

## 2022-11-30 NOTE — Patient Instructions (Addendum)
Shoulder HEP 3-4 week follow up

## 2022-12-02 ENCOUNTER — Ambulatory Visit (HOSPITAL_BASED_OUTPATIENT_CLINIC_OR_DEPARTMENT_OTHER): Payer: Medicare Other

## 2022-12-02 DIAGNOSIS — R251 Tremor, unspecified: Secondary | ICD-10-CM | POA: Diagnosis not present

## 2022-12-02 DIAGNOSIS — I10 Essential (primary) hypertension: Secondary | ICD-10-CM

## 2022-12-02 DIAGNOSIS — I2510 Atherosclerotic heart disease of native coronary artery without angina pectoris: Secondary | ICD-10-CM | POA: Diagnosis not present

## 2022-12-02 DIAGNOSIS — E782 Mixed hyperlipidemia: Secondary | ICD-10-CM

## 2022-12-02 LAB — ECHOCARDIOGRAM COMPLETE
Area-P 1/2: 3.85 cm2
MV M vel: 4.41 m/s
MV Peak grad: 77.8 mmHg
S' Lateral: 2.85 cm

## 2022-12-07 ENCOUNTER — Ambulatory Visit: Payer: Medicare Other | Admitting: Physician Assistant

## 2022-12-07 ENCOUNTER — Encounter: Payer: Self-pay | Admitting: Physician Assistant

## 2022-12-07 ENCOUNTER — Ambulatory Visit (HOSPITAL_COMMUNITY)
Admission: RE | Admit: 2022-12-07 | Discharge: 2022-12-07 | Disposition: A | Discharge status: Home / Self Care | Payer: Medicare Other | Source: Ambulatory Visit | Attending: Vascular Surgery | Admitting: Vascular Surgery

## 2022-12-07 VITALS — BP 152/74 | HR 57 | Temp 97.9°F | Resp 18 | Ht 63.0 in | Wt 150.1 lb

## 2022-12-07 DIAGNOSIS — I872 Venous insufficiency (chronic) (peripheral): Secondary | ICD-10-CM

## 2022-12-07 DIAGNOSIS — M79604 Pain in right leg: Secondary | ICD-10-CM | POA: Diagnosis not present

## 2022-12-07 DIAGNOSIS — I83899 Varicose veins of unspecified lower extremities with other complications: Secondary | ICD-10-CM | POA: Diagnosis not present

## 2022-12-07 DIAGNOSIS — M79605 Pain in left leg: Secondary | ICD-10-CM | POA: Insufficient documentation

## 2022-12-07 NOTE — Progress Notes (Signed)
Requested by:  Meriam Sprague, MD [phone] [place],  [place] [address]  Reason for consultation: varicose veins    History of Present Illness   Sarah Hendricks is a 77 y.o. (1946-02-25) female who presents for evaluation of spider veins with burning, itching, stinging and also swelling of bilateral ankles. She says this has been present for many years. It is becoming more bothersome. She says she does elevate with minimal improvement. She does also wear OTC knee high compression stockings but she says only really in the winter time. She denies any aching, throbbing, bleeding or ulceration. 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 She says she is overall very active. She has no history of DVT. No family history of DVT. She does have significant family history of venous disease in her mother, grandmother and older sister.   Venous symptoms include: burning, itching, stinging, swelling Onset/duration:  several years  Occupation:  retired Aggravating factors: sitting, standing Alleviating factors: none Compression:  yes Helps:  somewhat Pain medications:  no Previous vein procedures:  no History of DVT:  no  Past Medical History:  Diagnosis Date   Allergy    Arthritis    Breast cancer (HCC) 1994   lumpectomy and XRT, no chemo   CAD (coronary artery disease) 2008   PTCA, in [place]   GERD (gastroesophageal reflux disease)    History of chicken pox    Hyperlipidemia    Hypertension    Myocardial infarction (HCC) 03/15/2007   stent placed   Vitamin D deficiency     Past Surgical History:  Procedure Laterality Date   BREAST BIOPSY Left 1994   BREAST LUMPECTOMY Left 1994   CATARACT EXTRACTION, BILATERAL     CORONARY ANGIOPLASTY WITH STENT PLACEMENT  2008   CRANIECTOMY FOR DEPRESSED SKULL FRACTURE  1986   TONSILLECTOMY  1966    Social History   Socioeconomic History   Marital status: Married    Spouse  name: Not on file   Number of children: Not on file   Years of education: Not on file   Highest education level: Not on file  Occupational History   Not on file  Tobacco Use   Smoking status: Never   Smokeless tobacco: Never  Vaping Use   Vaping status: Never Used  Substance and Sexual Activity   Alcohol use: No   Drug use: No   Sexual activity: Not on file  Other Topics Concern   Not on file  Social History Narrative   Married, lives with spouse   Retired from [employer] irregularly, rides an exercise bike   Social Determinants of Health   Financial Resource Strain: Low Risk  (09/23/2022)   Overall Financial Resource Strain (CARDIA)    Difficulty of Paying Living Expenses: Not hard at all  Food Insecurity: No Food Insecurity (09/23/2022)   Hunger Vital Sign    Worried About Running Out of Food in the Last Year: Never true    Ran Out of Food in the Last Year: Never true  Transportation Needs: No Transportation Needs (09/23/2022)   PRAPARE - Administrator, Civil Service (Medical): No    Lack of Transportation (Non-Medical): No  Physical Activity: Sufficiently Active (09/23/2022)   Exercise Vital Sign    Days of Exercise per Week: 5 days    Minutes of Exercise per Session: 30 min  Stress:  No Stress Concern Present (09/23/2022)   [employer] of Occupational Health - Occupational Stress Questionnaire    Feeling of Stress : Not at all  Social Connections: Socially Integrated (09/23/2022)   Social Connection and Isolation Panel [NHANES]    Frequency of Communication with Friends and Family: More than three times a week    Frequency of Social Gatherings with Friends and Family: Twice a week    Attends Religious Services: More than 4 times per year    Active Member of [employer] or Organizations: No    Attends Engineer, structural: More than 4 times per year    Marital Status: Married  Catering manager Violence: Not At Risk (09/23/2022)   Humiliation,  Afraid, Rape, and Kick questionnaire    Fear of Current or Ex-Partner: No    Emotionally Abused: No    Physically Abused: No    Sexually Abused: No    Family History  Problem Relation Age of Onset   Breast cancer Mother    Hypertension Mother    Heart disease Mother    Stroke Mother    Arthritis Father    Heart disease Father        died at 60 of heart attack   Heart disease Brother    Stroke Maternal Grandmother    Heart disease Maternal Grandfather    Colon cancer Neg Hx    Esophageal cancer Neg Hx    Rectal cancer Neg Hx    Stomach cancer Neg Hx     Current Outpatient Medications  Medication Sig Dispense Refill   amLODipine (NORVASC) 5 MG tablet Take 1 tablet (5 mg total) by mouth daily. 90 tablet 3   aspirin 81 MG tablet Take 81 mg by mouth daily.     calcium carbonate (OS-CAL) 600 MG TABS tablet Take 600 mg by mouth 2 (two) times daily with a meal.      Cholecalciferol (EQL VITAMIN D3) 1000 units tablet Take 1 tablet (1,000 Units total) by mouth daily.     escitalopram (LEXAPRO) 10 MG tablet Take 1 tablet (10 mg total) by mouth daily. 90 tablet 1   losartan (COZAAR) 100 MG tablet TAKE 1 TABLET(100 MG) BY MOUTH DAILY 90 tablet 3   meloxicam (MOBIC) 15 MG tablet Take 1 tablet (15 mg total) by mouth daily. 30 tablet 0   Omega-3 Fatty Acids (FISH OIL) 1200 MG CAPS Take by mouth daily.     propranolol (INDERAL) 80 MG tablet Take 1 tablet (80 mg total) by mouth 2 (two) times daily. 180 tablet 3   rosuvastatin (CRESTOR) 10 MG tablet Take 1 tablet (10 mg total) by mouth daily. 90 tablet 3   vitamin k 100 MCG tablet Take 100 mcg by mouth. Take 1 capsule po every other day for bones     No current facility-administered medications for this visit.    Allergies  Allergen Reactions   Acetaminophen-Codeine Other (See Comments)    Bad dreams   Tigan [Trimethobenzamide] Other (See Comments)    Bad dreams   Alendronate     Caused leg pains    Atorvastatin Other (See Comments)     Pt reports "causes bilateral leg pains."   Codeine     Bad dreams    REVIEW OF SYSTEMS (negative unless checked):   Cardiac:  []  Chest pain or chest pressure? []  Shortness of breath upon activity? []  Shortness of breath when lying flat? []  Irregular heart rhythm?  Vascular:  []  Pain in calf,  thigh, or hip brought on by walking? []  Pain in feet at night that wakes you up from your sleep? []  Blood clot in your veins? []  Leg swelling?  Pulmonary:  []  Oxygen at home? []  Productive cough? []  Wheezing?  Neurologic:  []  Sudden weakness in arms or legs? []  Sudden numbness in arms or legs? []  Sudden onset of difficult speaking or slurred speech? []  Temporary loss of vision in one eye? []  Problems with dizziness?  Gastrointestinal:  []  Blood in stool? []  Vomited blood?  Genitourinary:  []  Burning when urinating? []  Blood in urine?  Psychiatric:  []  Major depression  Hematologic:  []  Bleeding problems? []  Problems with blood clotting?  Dermatologic:  []  Rashes or ulcers?  Constitutional:  []  Fever or chills?  Ear/Nose/Throat:  []  Change in hearing? []  Nose bleeds? []  Sore throat?  Musculoskeletal:  []  Back pain? []  Joint pain? []  Muscle pain?   Physical Examination     Vitals:   12/07/22 1105  BP: (!) 152/74  Pulse: (!) 57  Resp: 18  Temp: 97.9 F (36.6 C)  TempSrc: Temporal  SpO2: 96%  Weight: 150 lb 1.6 oz (68.1 kg)  Height: 5\' 3"  (1.6 m)   Body mass index is 26.59 kg/m.  General:  WDWN in NAD; vital signs documented above Gait: Normal  HENT: WNL, normocephalic Pulmonary: normal non-labored breathing without wheezing Cardiac: regular HR Abdomen: soft, NT, no masses Vascular Exam/Pulses: 2+ DP and PT pulses bilaterally Extremities: without varicose veins, with reticular veins and spider veins of bilateral lower extremities. Large cluster of reticular veins on medial distal thighs, R> L, with edema of bilateral ankles, without stasis  pigmentation, without lipodermatosclerosis, without ulcers Musculoskeletal: no muscle wasting or atrophy  Neurologic: A&O X 3;  No focal weakness or paresthesias are detected Psychiatric:  The pt has Normal affect.  Non-invasive Vascular Imaging   BLE Venous Insufficiency Duplex (12/07/22):  RLE:  No DVT and SVT GSV reflux SFJ to knee GSV diameter 0.32-0.42 No SSV reflux  No deep venous reflux  Medical Decision Making   Sarah Hendricks is a 77 y.o. female who presents with: BLE chronic venous insufficiency, with spider veins. Duplex today shows no DVT or SVT. She does not have any deep or SSV reflux. She does have some reflux in the GSV at the [facility] and proximal thigh. Based on today's ultrasound her veins are too small to be considered candidate for venous ablation. I did discuss with her option for sclerotherapy since a lot of her symptoms are from her reticular veins. I explained to her that this is considered cosmetic so treatment is out of pocket. I have provided her with Cherene Julian, RN contact information so that if she wishes to schedule she can do so.  Based on the patient's history and examination, I recommend: daily elevation above level of heart 20-30 minutes, knee high compression stockings, exercise, refraining from prolonged sitting or standing. She was measured for stockings today but wishes to wait to purchase any until the weather is cooler- she is a size medium She can follow up as needed if she has any new or concerning symptoms    Graceann Congress, PA-C [facility] Office: [phone]  12/07/2022, 11:07 AM  Clinic MD: Steve Rattler

## 2022-12-20 ENCOUNTER — Other Ambulatory Visit: Payer: Medicare Other

## 2022-12-20 ENCOUNTER — Ambulatory Visit (INDEPENDENT_AMBULATORY_CARE_PROVIDER_SITE_OTHER): Payer: Medicare Other

## 2022-12-20 ENCOUNTER — Ambulatory Visit: Payer: Medicare Other | Admitting: Sports Medicine

## 2022-12-20 VITALS — BP 132/82 | HR 60 | Ht 63.0 in | Wt 150.0 lb

## 2022-12-20 DIAGNOSIS — M7732 Calcaneal spur, left foot: Secondary | ICD-10-CM | POA: Diagnosis not present

## 2022-12-20 DIAGNOSIS — M25572 Pain in left ankle and joints of left foot: Secondary | ICD-10-CM | POA: Diagnosis not present

## 2022-12-20 DIAGNOSIS — M5126 Other intervertebral disc displacement, lumbar region: Secondary | ICD-10-CM | POA: Diagnosis not present

## 2022-12-20 DIAGNOSIS — M7662 Achilles tendinitis, left leg: Secondary | ICD-10-CM | POA: Diagnosis not present

## 2022-12-20 DIAGNOSIS — M25511 Pain in right shoulder: Secondary | ICD-10-CM

## 2022-12-20 DIAGNOSIS — M545 Low back pain, unspecified: Secondary | ICD-10-CM

## 2022-12-20 DIAGNOSIS — G8929 Other chronic pain: Secondary | ICD-10-CM | POA: Diagnosis not present

## 2022-12-20 DIAGNOSIS — Z043 Encounter for examination and observation following other accident: Secondary | ICD-10-CM | POA: Diagnosis not present

## 2022-12-20 DIAGNOSIS — W19XXXA Unspecified fall, initial encounter: Secondary | ICD-10-CM | POA: Diagnosis not present

## 2022-12-20 DIAGNOSIS — M19011 Primary osteoarthritis, right shoulder: Secondary | ICD-10-CM | POA: Diagnosis not present

## 2022-12-20 DIAGNOSIS — S4991XS Unspecified injury of right shoulder and upper arm, sequela: Secondary | ICD-10-CM

## 2022-12-20 DIAGNOSIS — S4991XA Unspecified injury of right shoulder and upper arm, initial encounter: Secondary | ICD-10-CM | POA: Diagnosis not present

## 2022-12-20 DIAGNOSIS — M5136 Other intervertebral disc degeneration, lumbar region: Secondary | ICD-10-CM | POA: Diagnosis not present

## 2022-12-20 DIAGNOSIS — M79605 Pain in left leg: Secondary | ICD-10-CM | POA: Diagnosis not present

## 2022-12-20 NOTE — Progress Notes (Signed)
Sarah Hendricks [facility] [address] [place] [address] Phone: [phone]   Assessment and Plan:     1. Acute left ankle pain 2. Chronic right shoulder pain 3. Chronic bilateral low back pain without sciatica 4. Fall, initial encounter  -Chronic with exacerbation, subsequent visit - Acute on chronic flares of multiple areas after a fall at night getting up from bed occurring on 12/12/2022.  Areas of maximal pain include left distal tib-fib, low back bilaterally, right shoulder - X-rays obtained in clinic.  My interpretation: No definitive acute fracture, dislocation, vertebral collapse.  Possible nondisplaced fracture of midshaft fibula.  Difficult to assess right lower ribs.  Will await radiology review - Start meloxicam 15 mg daily x2 weeks.    Do not to use additional NSAIDs while taking meloxicam.  May use Tylenol [phone] mg 2 to 3 times a day for breakthrough pain. -Recommend relative rest and fall prevention  Pertinent previous records reviewed include none   Follow Up: 3 weeks for reevaluation.  Could consider physical therapy versus advanced imaging based on presentation   Subjective:   I, Sarah Hendricks, am serving as a Neurosurgeon for Doctor Richardean Sale  Chief Complaint: left ankle pain   HPI:   12/20/22 Patient is a 77 year old female complaining of left ankle pain. Patient states she was getting out of bed and she fell last Sunday night pain in her left leg . Has bruising. Does have some shoulder pain but she states that is okay. Tylenol for the pain once a day. Pain does radiate up and down the leg but she is more afraid for a blood clot   Relevant Historical Information: Hypertension, osteoporosis, prediabetes  Additional pertinent review of systems negative.   Current Outpatient Medications:    amLODipine (NORVASC) 5 MG tablet, Take 1 tablet (5 mg total) by mouth daily., Disp: 90 tablet, Rfl: 3   aspirin 81 MG  tablet, Take 81 mg by mouth daily., Disp: , Rfl:    calcium carbonate (OS-CAL) 600 MG TABS tablet, Take 600 mg by mouth 2 (two) times daily with a meal. , Disp: , Rfl:    Cholecalciferol (EQL VITAMIN D3) 1000 units tablet, Take 1 tablet (1,000 Units total) by mouth daily., Disp: , Rfl:    escitalopram (LEXAPRO) 10 MG tablet, Take 1 tablet (10 mg total) by mouth daily., Disp: 90 tablet, Rfl: 1   losartan (COZAAR) 100 MG tablet, TAKE 1 TABLET(100 MG) BY MOUTH DAILY, Disp: 90 tablet, Rfl: 3   Omega-3 Fatty Acids (FISH OIL) 1200 MG CAPS, Take by mouth daily., Disp: , Rfl:    propranolol (INDERAL) 80 MG tablet, Take 1 tablet (80 mg total) by mouth 2 (two) times daily., Disp: 180 tablet, Rfl: 3   rosuvastatin (CRESTOR) 10 MG tablet, Take 1 tablet (10 mg total) by mouth daily., Disp: 90 tablet, Rfl: 3   vitamin k 100 MCG tablet, Take 100 mcg by mouth. Take 1 capsule po every other day for bones, Disp: , Rfl:    Objective:     Vitals:   12/20/22 1340  BP: 132/82  Pulse: 60  SpO2: 96%  Weight: 150 lb (68 kg)  Height: 5\' 3"  (1.6 m)      Body mass index is 26.57 kg/m.    Physical Exam:    Gen: Appears well, nad, nontoxic and pleasant Psych: Alert and oriented, appropriate mood and affect Neuro: sensation intact, strength is 5/5 with df/pf/inv/ev, muscle tone  wnl Skin: no susupicious lesions or rashes  NTTP lumbar spinous processes  Right tib-fib/ankle:  No deformity, Ecchymosis in different stages of healing over distal third tib-fib, lateral ankle, anterior ankle TTP fibular shaft, anterior ankle joint line, lateral malleolus NTTP over fibular head,   medial mal, achilles,   deltoid, calcaneous   ROM DF 30, PF 45, inv/ev intact Negative ant drawer, talar tilt, rotation test, squeeze test. Neg thompson No pain with resisted inversion or eversion    Electronically signed by:  Sarah Hendricks [facility] 2:35 PM 12/20/22

## 2022-12-20 NOTE — Patient Instructions (Addendum)
-   Start meloxicam 15 mg daily x2 weeks. May use remaining meloxicam as needed once daily for pain control.  Do not to use additional NSAIDs while taking meloxicam.  May use Tylenol [phone] mg 2 to 3 times a day for breakthrough pain. Call us if you need a refill  Xrays on the way out  3 week follow up

## 2022-12-21 ENCOUNTER — Ambulatory Visit (INDEPENDENT_AMBULATORY_CARE_PROVIDER_SITE_OTHER): Payer: Self-pay

## 2022-12-21 DIAGNOSIS — I8393 Asymptomatic varicose veins of bilateral lower extremities: Secondary | ICD-10-CM

## 2022-12-21 NOTE — Progress Notes (Addendum)
      Treated pt's BLE spider and small reticular veins with 4 mL (40 mg) of  Asclera 1%, administered with a 27 gauge butterfly needle. Pt has a large amount of spider vein clusters, but only wanted to 2 syringes today, so I focused on her two primary areas of concern. We did 1 syringe in each leg. She will likely need more treatments, even on these areas as they are quite large. We discussed this and pt states she would like to return in the fall for second treatment. She had swelling and bruising on LLE near ankle and foot, from a recent fall. She tolerated very well. She had previously been measured for compression hose at her last appt and was placed in those today after treatment. She was given post treatment care instructions on handout and verbally.

## 2022-12-28 ENCOUNTER — Ambulatory Visit: Payer: Medicare Other | Admitting: Sports Medicine

## 2023-01-10 NOTE — Progress Notes (Unsigned)
    [name] D.[facility] [address] [place] [address] Phone: [phone]   Assessment and Plan:     There are no diagnoses linked to this encounter.  ***   Pertinent previous records reviewed include ***   Follow Up: ***     Subjective:   I, Sarah Hendricks, am serving as a Neurosurgeon for Doctor Richardean Sale   Chief Complaint: left ankle pain    HPI:    12/20/22 Patient is a 77 year old female complaining of left ankle pain. Patient states she was getting out of bed and she fell last Sunday night pain in her left leg . Has bruising. Does have some shoulder pain but she states that is okay. Tylenol for the pain once a day. Pain does radiate up and down the leg but she is more afraid for a blood clot   01/11/2023 Patient states   Relevant Historical Information: Hypertension, osteoporosis, prediabete  Additional pertinent review of systems negative.   Current Outpatient Medications:    amLODipine (NORVASC) 5 MG tablet, Take 1 tablet (5 mg total) by mouth daily., Disp: 90 tablet, Rfl: 3   aspirin 81 MG tablet, Take 81 mg by mouth daily., Disp: , Rfl:    calcium carbonate (OS-CAL) 600 MG TABS tablet, Take 600 mg by mouth 2 (two) times daily with a meal. , Disp: , Rfl:    Cholecalciferol (EQL VITAMIN D3) 1000 units tablet, Take 1 tablet (1,000 Units total) by mouth daily., Disp: , Rfl:    escitalopram (LEXAPRO) 10 MG tablet, Take 1 tablet (10 mg total) by mouth daily., Disp: 90 tablet, Rfl: 1   losartan (COZAAR) 100 MG tablet, TAKE 1 TABLET(100 MG) BY MOUTH DAILY, Disp: 90 tablet, Rfl: 3   Omega-3 Fatty Acids (FISH OIL) 1200 MG CAPS, Take by mouth daily., Disp: , Rfl:    propranolol (INDERAL) 80 MG tablet, Take 1 tablet (80 mg total) by mouth 2 (two) times daily., Disp: 180 tablet, Rfl: 3   rosuvastatin (CRESTOR) 10 MG tablet, Take 1 tablet (10 mg total) by mouth daily., Disp: 90 tablet, Rfl: 3   vitamin k 100 MCG tablet, Take  100 mcg by mouth. Take 1 capsule po every other day for bones, Disp: , Rfl:    Objective:     There were no vitals filed for this visit.    There is no height or weight on file to calculate BMI.    Physical Exam:    ***   Electronically signed by:  [name] [facility] 12:13 PM 01/10/23

## 2023-01-11 ENCOUNTER — Ambulatory Visit: Payer: Medicare Other | Admitting: Sports Medicine

## 2023-01-11 VITALS — BP 120/84 | HR 61 | Ht 63.0 in

## 2023-01-11 DIAGNOSIS — M25511 Pain in right shoulder: Secondary | ICD-10-CM

## 2023-01-11 DIAGNOSIS — W19XXXD Unspecified fall, subsequent encounter: Secondary | ICD-10-CM

## 2023-01-11 DIAGNOSIS — G8929 Other chronic pain: Secondary | ICD-10-CM | POA: Diagnosis not present

## 2023-01-11 NOTE — Patient Instructions (Signed)
Good to see you  Discontinue Meloxicam use remainder as needed Tylenol [phone] mg 2-3 times a day for pain relief  Continue home exercises Follow up in six weeks

## 2023-01-24 DIAGNOSIS — K080 Exfoliation of teeth due to systemic causes: Secondary | ICD-10-CM | POA: Diagnosis not present

## 2023-01-27 NOTE — Progress Notes (Signed)
Subjective:    Patient ID: Sarah Hendricks, female    DOB: 11-May-1946, 77 y.o.   MRN: [id number]     HPI Sarah Hendricks is here for follow up of her chronic medical problems.  She fell the first of August - got out of bed - hit the wall and hit her head.  She hit the nightstand and hit her leg.  She is still sore from it.   She hurts in her ribs when she breaths deeply.  There is some slow improvement.  She takes tylenol.  Otherwise no concerns  Medications and allergies reviewed with patient and updated if appropriate.  Current Outpatient Medications on File Prior to Visit  Medication Sig Dispense Refill   aspirin 81 MG tablet Take 81 mg by mouth daily.     calcium carbonate (OS-CAL) 600 MG TABS tablet Take 600 mg by mouth 2 (two) times daily with a meal.      Cholecalciferol (EQL VITAMIN D3) 1000 units tablet Take 1 tablet (1,000 Units total) by mouth daily.     escitalopram (LEXAPRO) 10 MG tablet Take 1 tablet (10 mg total) by mouth daily. 90 tablet 1   hydrochlorothiazide (HYDRODIURIL) 12.5 MG tablet Take 12.5 mg by mouth daily.     losartan (COZAAR) 100 MG tablet TAKE 1 TABLET(100 MG) BY MOUTH DAILY 90 tablet 3   Omega-3 Fatty Acids (FISH OIL) 1200 MG CAPS Take by mouth daily.     propranolol (INDERAL) 80 MG tablet Take 1 tablet (80 mg total) by mouth 2 (two) times daily. 180 tablet 3   rosuvastatin (CRESTOR) 10 MG tablet Take 1 tablet (10 mg total) by mouth daily. 90 tablet 3   vitamin k 100 MCG tablet Take 100 mcg by mouth. Take 1 capsule po every other day for bones     No current facility-administered medications on file prior to visit.     Review of Systems  Constitutional:  Negative for fever.  Respiratory:  Negative for cough, shortness of breath and wheezing.   Cardiovascular:  Negative for chest pain, palpitations and leg swelling.  Neurological:  Negative for light-headedness and headaches.       Objective:   Vitals:   01/28/23 0835  BP: 130/68  Pulse: 80   Temp: 98 F (36.7 C)  SpO2: 94%   BP Readings from Last 3 Encounters:  01/28/23 130/68  01/11/23 120/84  12/20/22 132/82   Wt Readings from Last 3 Encounters:  01/28/23 153 lb (69.4 kg)  12/20/22 150 lb (68 kg)  12/07/22 150 lb 1.6 oz (68.1 kg)   Body mass index is 27.1 kg/m.    Physical Exam Constitutional:      General: She is not in acute distress.    Appearance: Normal appearance.  HENT:     Head: Normocephalic and atraumatic.  Eyes:     Conjunctiva/sclera: Conjunctivae normal.  Cardiovascular:     Rate and Rhythm: Normal rate and regular rhythm.     Heart sounds: Normal heart sounds.  Pulmonary:     Effort: Pulmonary effort is normal. No respiratory distress.     Breath sounds: Normal breath sounds. No wheezing.  Musculoskeletal:     Cervical back: Neck supple.     Right lower leg: No edema.     Left lower leg: No edema.  Lymphadenopathy:     Cervical: No cervical adenopathy.  Skin:    General: Skin is warm and dry.     Findings: No rash.  Neurological:     Mental Status: She is alert. Mental status is at baseline.  Psychiatric:        Mood and Affect: Mood normal.        Behavior: Behavior normal.        Lab Results  Component Value Date   WBC 6.8 08/23/2022   HGB 13.0 08/23/2022   HCT 37.9 08/23/2022   PLT 266.0 08/23/2022   GLUCOSE 98 10/27/2022   CHOL 143 08/23/2022   TRIG 69.0 08/23/2022   HDL 77.70 08/23/2022   LDLDIRECT 87.0 06/12/2018   LDLCALC 51 08/23/2022   ALT 15 08/23/2022   AST 15 08/23/2022   NA 133 (L) 10/27/2022   K 4.2 10/27/2022   CL 95 (L) 10/27/2022   CREATININE 0.93 10/27/2022   BUN 13 10/27/2022   CO2 25 10/27/2022   TSH 1.17 08/23/2022   HGBA1C 5.8 08/23/2022     Assessment & Plan:    See Problem List for Assessment and Plan of chronic medical problems.  Flu shot given

## 2023-01-27 NOTE — Patient Instructions (Addendum)
     Flu immunization administered today.      Blood work was ordered.   The lab is on the first floor.    Medications changes include :   none     Return in about 6 months (around 07/28/2023) for Physical Exam.

## 2023-01-28 ENCOUNTER — Ambulatory Visit (INDEPENDENT_AMBULATORY_CARE_PROVIDER_SITE_OTHER): Payer: Medicare Other | Admitting: Internal Medicine

## 2023-01-28 ENCOUNTER — Encounter: Payer: Self-pay | Admitting: Internal Medicine

## 2023-01-28 VITALS — BP 130/68 | HR 80 | Temp 98.0°F | Ht 63.0 in | Wt 153.0 lb

## 2023-01-28 DIAGNOSIS — Z23 Encounter for immunization: Secondary | ICD-10-CM | POA: Diagnosis not present

## 2023-01-28 DIAGNOSIS — E559 Vitamin D deficiency, unspecified: Secondary | ICD-10-CM

## 2023-01-28 DIAGNOSIS — R7303 Prediabetes: Secondary | ICD-10-CM

## 2023-01-28 DIAGNOSIS — E871 Hypo-osmolality and hyponatremia: Secondary | ICD-10-CM

## 2023-01-28 DIAGNOSIS — E7849 Other hyperlipidemia: Secondary | ICD-10-CM | POA: Diagnosis not present

## 2023-01-28 DIAGNOSIS — M810 Age-related osteoporosis without current pathological fracture: Secondary | ICD-10-CM | POA: Diagnosis not present

## 2023-01-28 DIAGNOSIS — I2510 Atherosclerotic heart disease of native coronary artery without angina pectoris: Secondary | ICD-10-CM | POA: Diagnosis not present

## 2023-01-28 DIAGNOSIS — F4329 Adjustment disorder with other symptoms: Secondary | ICD-10-CM

## 2023-01-28 DIAGNOSIS — I10 Essential (primary) hypertension: Secondary | ICD-10-CM | POA: Diagnosis not present

## 2023-01-28 LAB — COMPREHENSIVE METABOLIC PANEL
ALT: 10 U/L (ref 0–35)
AST: 18 U/L (ref 0–37)
Albumin: 3.9 g/dL (ref 3.5–5.2)
Alkaline Phosphatase: 74 U/L (ref 39–117)
BUN: 11 mg/dL (ref 6–23)
CO2: 30 meq/L (ref 19–32)
Calcium: 9.3 mg/dL (ref 8.4–10.5)
Chloride: 94 meq/L — ABNORMAL LOW (ref 96–112)
Creatinine, Ser: 0.79 mg/dL (ref 0.40–1.20)
GFR: 72.9 mL/min (ref >60.00)
Glucose, Bld: 92 mg/dL (ref 70–99)
Potassium: 3.8 meq/L (ref 3.5–5.1)
Sodium: 130 meq/L — ABNORMAL LOW (ref 135–145)
Total Bilirubin: 0.6 mg/dL (ref 0.2–1.2)
Total Protein: 6.6 g/dL (ref 6.0–8.3)

## 2023-01-28 LAB — LIPID PANEL
Cholesterol: 137 mg/dL (ref 0–200)
HDL: 50.6 mg/dL (ref >39.00)
LDL Cholesterol: 54 mg/dL (ref 0–99)
NonHDL: 86.18
Total CHOL/HDL Ratio: 3
Triglycerides: 162 mg/dL — ABNORMAL HIGH (ref 0.0–149.0)
VLDL: 32.4 mg/dL (ref 0.0–40.0)

## 2023-01-28 LAB — VITAMIN D 25 HYDROXY (VIT D DEFICIENCY, FRACTURES): VITD: 32.17 ng/mL (ref 30.00–100.00)

## 2023-01-28 LAB — HEMOGLOBIN A1C: Hgb A1c MFr Bld: 5.8 % (ref 4.6–6.5)

## 2023-01-28 NOTE — Assessment & Plan Note (Signed)
Chronic Lab Results  Component Value Date   HGBA1C 5.8 08/23/2022   Check a1c Low sugar / carb diet Stressed regular exercise

## 2023-01-28 NOTE — Assessment & Plan Note (Signed)
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

## 2023-01-28 NOTE — Assessment & Plan Note (Signed)
Chronic With osteoporosis Continue vitamin D daily Check vitamin D level

## 2023-01-28 NOTE — Assessment & Plan Note (Signed)
Neck Blood pressure well-controlled CMP Continue losartan 100 mg daily, propranolol 80 mg twice daily

## 2023-01-28 NOTE — Assessment & Plan Note (Addendum)
Chronic DEXA up-to-date Has osteoporosis Has not tolerated alendronate or evista in the past Discussed high risk of fracture-she had a recent fall and discussed again her risk of fracture Deferred other medication at this time Stressed regular exercise, balance exercises Continue calcium and vitamin D supplementation Check vitamin D level

## 2023-01-28 NOTE — Assessment & Plan Note (Signed)
Chronic History of stent following MI in 2008 while living in [place] Continue aspirin 81 mg daily, Crestor 10 mg daily Encouraged regular exercise, healthy diet

## 2023-01-28 NOTE — Assessment & Plan Note (Signed)
Chronic Mild CMP No improvement after hydrochlorothiazide was stopped Lexapro decreased-showed improvement

## 2023-01-28 NOTE — Assessment & Plan Note (Signed)
Chronic Regular exercise and healthy diet encouraged LDL have been at goal Check lipid panel  Continue Crestor 10 mg daily  Lab Results  Component Value Date   LDLCALC 51 08/23/2022

## 2023-02-07 ENCOUNTER — Telehealth (HOSPITAL_BASED_OUTPATIENT_CLINIC_OR_DEPARTMENT_OTHER): Payer: Self-pay | Admitting: Cardiology

## 2023-02-07 NOTE — Telephone Encounter (Signed)
Pt c/o medication issue:  1. Name of Medication: propranolol (INDERAL) 80 MG tablet   2. How are you currently taking this medication (dosage and times per day)?  Take 1 tablet (80 mg total) by mouth 2 (two) times daily.   3. Are you having a reaction (difficulty breathing--STAT)?   4. What is your medication issue? Patient states she hasn't be able to sleep lately, and has had a small amount of weight gain.  She thinks this medication might be causing this.

## 2023-02-07 NOTE — Telephone Encounter (Signed)
Has been taking Propranolol for couple of years. Has been having issues with not being able to sleep for months, discussed with Dr Shari Prows in June  Concerned coming from her Propranolol since it is a side effect Does take care of her husband who has had a stroke Denies any shortness of breath or swelling in lower extremities  Has not discussed with PCP   Advised would forward to Dr Cristal Deer

## 2023-02-23 NOTE — Progress Notes (Unsigned)
    [name] D.[facility] [address] [place] [address] Phone: [phone]   Assessment and Plan:     There are no diagnoses linked to this encounter.  ***   Pertinent previous records reviewed include ***   Follow Up: ***     Subjective:   I, Sarah Hendricks, am serving as a Neurosurgeon for Doctor Richardean Sale   Chief Complaint: left ankle pain    HPI:    12/20/22 Patient is a 77 year old female complaining of left ankle pain. Patient states she was getting out of bed and she fell last Sunday night pain in her left leg . Has bruising. Does have some shoulder pain but she states that is okay. Tylenol for the pain once a day. Pain does radiate up and down the leg but she is more afraid for a blood clot    01/11/2023 Patient states doing better, but right shoulder is still bothering her. Patient states the meloxicam not ure if it helped but it made her really dizzy.   02/24/2023 Patient states   Relevant Historical Information: Hypertension, osteoporosis, prediabete  Additional pertinent review of systems negative.   Current Outpatient Medications:    aspirin 81 MG tablet, Take 81 mg by mouth daily., Disp: , Rfl:    calcium carbonate (OS-CAL) 600 MG TABS tablet, Take 600 mg by mouth 2 (two) times daily with a meal. , Disp: , Rfl:    Cholecalciferol (EQL VITAMIN D3) 1000 units tablet, Take 1 tablet (1,000 Units total) by mouth daily., Disp: , Rfl:    escitalopram (LEXAPRO) 10 MG tablet, Take 1 tablet (10 mg total) by mouth daily., Disp: 90 tablet, Rfl: 1   hydrochlorothiazide (HYDRODIURIL) 12.5 MG tablet, Take 12.5 mg by mouth daily., Disp: , Rfl:    losartan (COZAAR) 100 MG tablet, TAKE 1 TABLET(100 MG) BY MOUTH DAILY, Disp: 90 tablet, Rfl: 3   Omega-3 Fatty Acids (FISH OIL) 1200 MG CAPS, Take by mouth daily., Disp: , Rfl:    propranolol (INDERAL) 80 MG tablet, Take 1 tablet (80 mg total) by mouth 2 (two) times daily., Disp: 180  tablet, Rfl: 3   rosuvastatin (CRESTOR) 10 MG tablet, Take 1 tablet (10 mg total) by mouth daily., Disp: 90 tablet, Rfl: 3   vitamin k 100 MCG tablet, Take 100 mcg by mouth. Take 1 capsule po every other day for bones, Disp: , Rfl:    Objective:     There were no vitals filed for this visit.    There is no height or weight on file to calculate BMI.    Physical Exam:    ***   Electronically signed by:  [name] D.[facility] 3:24 PM 02/23/23

## 2023-02-24 ENCOUNTER — Ambulatory Visit: Payer: Medicare Other | Admitting: Sports Medicine

## 2023-02-24 ENCOUNTER — Ambulatory Visit: Payer: Medicare Other | Admitting: Internal Medicine

## 2023-02-24 VITALS — HR 69 | Ht 63.0 in | Wt 153.0 lb

## 2023-02-24 DIAGNOSIS — M7062 Trochanteric bursitis, left hip: Secondary | ICD-10-CM

## 2023-02-24 DIAGNOSIS — M25572 Pain in left ankle and joints of left foot: Secondary | ICD-10-CM

## 2023-02-24 DIAGNOSIS — M25561 Pain in right knee: Secondary | ICD-10-CM

## 2023-02-24 DIAGNOSIS — G8929 Other chronic pain: Secondary | ICD-10-CM

## 2023-02-24 DIAGNOSIS — M25511 Pain in right shoulder: Secondary | ICD-10-CM | POA: Diagnosis not present

## 2023-02-24 MED ORDER — MELOXICAM 15 MG PO TABS: 15.0000 mg | ORAL_TABLET | Freq: Every day | ORAL | 0 refills | Status: DC

## 2023-02-24 NOTE — Patient Instructions (Signed)
-   Start meloxicam 15 mg daily x2 weeks.  If still having pain after 2 weeks, complete 3rd-week of meloxicam. May use remaining meloxicam as needed once daily for pain control.  Do not to use additional NSAIDs while taking meloxicam.  May use Tylenol [phone] mg 2 to 3 times a day for breakthrough pain. Call us If you need a refill  4 week follow up

## 2023-02-25 ENCOUNTER — Encounter: Payer: Self-pay | Admitting: Internal Medicine

## 2023-03-01 ENCOUNTER — Encounter: Payer: Self-pay | Admitting: Internal Medicine

## 2023-03-01 NOTE — Progress Notes (Unsigned)
    Subjective:    Patient ID: Sarah Hendricks, female    DOB: 1946-03-27, 77 y.o.   MRN: [id number]      HPI Sarah Hendricks is here for No chief complaint on file.    Retaining fluid in left leg -     Medications and allergies reviewed with patient and updated if appropriate.  Current Outpatient Medications on File Prior to Visit  Medication Sig Dispense Refill   aspirin 81 MG tablet Take 81 mg by mouth daily.     calcium carbonate (OS-CAL) 600 MG TABS tablet Take 600 mg by mouth 2 (two) times daily with a meal.      Cholecalciferol (EQL VITAMIN D3) 1000 units tablet Take 1 tablet (1,000 Units total) by mouth daily.     escitalopram (LEXAPRO) 10 MG tablet Take 1 tablet (10 mg total) by mouth daily. 90 tablet 1   hydrochlorothiazide (HYDRODIURIL) 12.5 MG tablet Take 12.5 mg by mouth daily.     losartan (COZAAR) 100 MG tablet TAKE 1 TABLET(100 MG) BY MOUTH DAILY 90 tablet 3   Omega-3 Fatty Acids (FISH OIL) 1200 MG CAPS Take by mouth daily.     propranolol (INDERAL) 80 MG tablet Take 1 tablet (80 mg total) by mouth 2 (two) times daily. 180 tablet 3   rosuvastatin (CRESTOR) 10 MG tablet Take 1 tablet (10 mg total) by mouth daily. 90 tablet 3   vitamin k 100 MCG tablet Take 100 mcg by mouth. Take 1 capsule po every other day for bones     No current facility-administered medications on file prior to visit.    Review of Systems     Objective:  There were no vitals filed for this visit. BP Readings from Last 3 Encounters:  01/28/23 130/68  01/11/23 120/84  12/20/22 132/82   Wt Readings from Last 3 Encounters:  02/24/23 153 lb (69.4 kg)  01/28/23 153 lb (69.4 kg)  12/20/22 150 lb (68 kg)   There is no height or weight on file to calculate BMI.    Physical Exam         Assessment & Plan:    See Problem List for Assessment and Plan of chronic medical problems.

## 2023-03-02 ENCOUNTER — Ambulatory Visit (INDEPENDENT_AMBULATORY_CARE_PROVIDER_SITE_OTHER): Payer: Medicare Other | Admitting: Internal Medicine

## 2023-03-02 VITALS — BP 134/72 | HR 78 | Temp 98.0°F | Ht 63.0 in | Wt 156.4 lb

## 2023-03-02 DIAGNOSIS — R2242 Localized swelling, mass and lump, left lower limb: Secondary | ICD-10-CM | POA: Diagnosis not present

## 2023-03-02 DIAGNOSIS — I10 Essential (primary) hypertension: Secondary | ICD-10-CM | POA: Diagnosis not present

## 2023-03-02 NOTE — Assessment & Plan Note (Signed)
Acute Having some mild swelling or fluid accumulation medial aspect of left lower leg just distal to the knee and occasionally in ankle region No swelling in the right lower extremity, no shortness of breath, chest pain or palpitations Swelling likely related to knee osteoarthritis Discussed symptomatic treatment-elevating leg when sitting, walking after sitting for long periods, compression socks and treating the knee osteoarthritis

## 2023-03-02 NOTE — Assessment & Plan Note (Signed)
Neck Blood pressure well-controlled Continue losartan 100 mg daily, propranolol 80 mg twice daily

## 2023-03-02 NOTE — Patient Instructions (Addendum)
      Your left leg swelling is likely related to your knee arthritis.   Elevate your leg when sitting, wear the compression socks and treat your knee arthritis with ice or topical medications.    Return if symptoms worsen or fail to improve.

## 2023-03-14 NOTE — Progress Notes (Unsigned)
    [name] D.[facility] [address] [place] [address] Phone: [phone]   Assessment and Plan:     There are no diagnoses linked to this encounter.  ***   Pertinent previous records reviewed include ***   Follow Up: ***     Subjective:   I, [name], am serving as a Neurosurgeon for Doctor Richardean Sale   Chief Complaint: left ankle pain    HPI:    12/20/22 Patient is a 77 year old female complaining of left ankle pain. Patient states she was getting out of bed and she fell last Sunday night pain in her left leg . Has bruising. Does have some shoulder pain but she states that is okay. Tylenol for the pain once a day. Pain does radiate up and down the leg but she is more afraid for a blood clot    01/11/2023 Patient states doing better, but right shoulder is still bothering her. Patient states the meloxicam not ure if it helped but it made her really dizzy.   02/24/2023 Patient states that her ankle is fine. Knee , shoulder and hip are flared   03/15/2023 Patient states right knee   Relevant Historical Information: Hypertension, osteoporosis, prediabete  Additional pertinent review of systems negative.   Current Outpatient Medications:    aspirin 81 MG tablet, Take 81 mg by mouth daily., Disp: , Rfl:    calcium carbonate (OS-CAL) 600 MG TABS tablet, Take 600 mg by mouth 2 (two) times daily with a meal. , Disp: , Rfl:    Cholecalciferol (EQL VITAMIN D3) 1000 units tablet, Take 1 tablet (1,000 Units total) by mouth daily., Disp: , Rfl:    escitalopram (LEXAPRO) 10 MG tablet, Take 1 tablet (10 mg total) by mouth daily., Disp: 90 tablet, Rfl: 1   hydrochlorothiazide (HYDRODIURIL) 12.5 MG tablet, Take 12.5 mg by mouth daily., Disp: , Rfl:    losartan (COZAAR) 100 MG tablet, TAKE 1 TABLET(100 MG) BY MOUTH DAILY, Disp: 90 tablet, Rfl: 3   meloxicam (MOBIC) 15 MG tablet, Take 15 mg by mouth daily., Disp: , Rfl:    Omega-3  Fatty Acids (FISH OIL) 1200 MG CAPS, Take by mouth daily., Disp: , Rfl:    propranolol (INDERAL) 80 MG tablet, Take 1 tablet (80 mg total) by mouth 2 (two) times daily., Disp: 180 tablet, Rfl: 3   rosuvastatin (CRESTOR) 10 MG tablet, Take 1 tablet (10 mg total) by mouth daily., Disp: 90 tablet, Rfl: 3   vitamin k 100 MCG tablet, Take 100 mcg by mouth. Take 1 capsule po every other day for bones, Disp: , Rfl:    Objective:     There were no vitals filed for this visit.    There is no height or weight on file to calculate BMI.    Physical Exam:    ***   Electronically signed by:  [name] [facility] 4:06 PM 03/14/23

## 2023-03-15 ENCOUNTER — Ambulatory Visit: Payer: Medicare Other | Admitting: Sports Medicine

## 2023-03-15 VITALS — BP 120/82 | HR 60 | Ht 63.0 in | Wt 156.0 lb

## 2023-03-15 DIAGNOSIS — G8929 Other chronic pain: Secondary | ICD-10-CM

## 2023-03-15 DIAGNOSIS — M25561 Pain in right knee: Secondary | ICD-10-CM

## 2023-03-15 DIAGNOSIS — M25511 Pain in right shoulder: Secondary | ICD-10-CM | POA: Diagnosis not present

## 2023-03-15 DIAGNOSIS — M1711 Unilateral primary osteoarthritis, right knee: Secondary | ICD-10-CM

## 2023-03-15 NOTE — Patient Instructions (Signed)
We will call you once we get approval for zilretta

## 2023-03-17 ENCOUNTER — Encounter: Payer: Self-pay | Admitting: Sports Medicine

## 2023-03-23 NOTE — Progress Notes (Unsigned)
    Sarah Hendricks D.[facility] [address] [place] [address] Phone: [phone]   Assessment and Plan:     There are no diagnoses linked to this encounter.  ***   Pertinent previous records reviewed include ***   Follow Up: ***     Subjective:   I, Sarah Hendricks, am serving as a Neurosurgeon for Doctor Sarah Hendricks   Chief Complaint: multiple body parts    HPI:    12/20/22 Patient is a 77 year old female complaining of left ankle pain. Patient states she was getting out of bed and she fell last Sunday night pain in her left leg . Has bruising. Does have some shoulder pain but she states that is okay. Tylenol for the pain once a day. Pain does radiate up and down the leg but she is more afraid for a blood clot    01/11/2023 Patient states doing better, but right shoulder is still bothering her. Patient states the meloxicam not ure if it helped but it made her really dizzy.   02/24/2023 Patient states that her ankle is fine. Knee , shoulder and hip are flared    03/15/2023 Patient states right knee is doing terrible. Pain has been keeping her up at night . Meloxicam is no longer helping   03/24/2023 Patient states   Relevant Historical Information: Hypertension, osteoporosis, prediabete  Additional pertinent review of systems negative.   Current Outpatient Medications:    aspirin 81 MG tablet, Take 81 mg by mouth daily., Disp: , Rfl:    calcium carbonate (OS-CAL) 600 MG TABS tablet, Take 600 mg by mouth 2 (two) times daily with a meal. , Disp: , Rfl:    Cholecalciferol (EQL VITAMIN D3) 1000 units tablet, Take 1 tablet (1,000 Units total) by mouth daily., Disp: , Rfl:    escitalopram (LEXAPRO) 10 MG tablet, Take 1 tablet (10 mg total) by mouth daily., Disp: 90 tablet, Rfl: 1   hydrochlorothiazide (HYDRODIURIL) 12.5 MG tablet, Take 12.5 mg by mouth daily., Disp: , Rfl:    losartan (COZAAR) 100 MG tablet, TAKE 1 TABLET(100 MG) BY  MOUTH DAILY, Disp: 90 tablet, Rfl: 3   meloxicam (MOBIC) 15 MG tablet, Take 15 mg by mouth daily., Disp: , Rfl:    Omega-3 Fatty Acids (FISH OIL) 1200 MG CAPS, Take by mouth daily., Disp: , Rfl:    propranolol (INDERAL) 80 MG tablet, Take 1 tablet (80 mg total) by mouth 2 (two) times daily., Disp: 180 tablet, Rfl: 3   rosuvastatin (CRESTOR) 10 MG tablet, Take 1 tablet (10 mg total) by mouth daily., Disp: 90 tablet, Rfl: 3   vitamin k 100 MCG tablet, Take 100 mcg by mouth. Take 1 capsule po every other day for bones, Disp: , Rfl:    Objective:     There were no vitals filed for this visit.    There is no height or weight on file to calculate BMI.    Physical Exam:    ***   Electronically signed by:  Sarah Hendricks D.[facility] 7:26 AM 03/23/23

## 2023-03-24 ENCOUNTER — Ambulatory Visit: Payer: Medicare Other | Admitting: Sports Medicine

## 2023-03-24 DIAGNOSIS — M25561 Pain in right knee: Secondary | ICD-10-CM

## 2023-03-24 DIAGNOSIS — G8929 Other chronic pain: Secondary | ICD-10-CM

## 2023-03-24 DIAGNOSIS — M1711 Unilateral primary osteoarthritis, right knee: Secondary | ICD-10-CM

## 2023-03-24 MED ORDER — TRIAMCINOLONE ACETONIDE 32 MG IX SRER
32.0000 mg | Freq: Once | INTRA_ARTICULAR | Status: AC
Start: 2023-03-24 — End: 2023-03-24
  Administered 2023-03-24: 32 mg via INTRA_ARTICULAR

## 2023-03-25 ENCOUNTER — Other Ambulatory Visit: Payer: Self-pay | Admitting: Internal Medicine

## 2023-03-29 ENCOUNTER — Ambulatory Visit (INDEPENDENT_AMBULATORY_CARE_PROVIDER_SITE_OTHER): Payer: Self-pay

## 2023-03-29 DIAGNOSIS — I8393 Asymptomatic varicose veins of bilateral lower extremities: Secondary | ICD-10-CM

## 2023-03-29 NOTE — Progress Notes (Signed)
Treated pt's small reticular and spider veins of BLE with Asclera 1%,  administered with a 27g butterfly.  Patient received a total of 4 mL/40 mg of Asclera 1%. Pt tolerated well; easy access. She had experienced good results from the previous treatment, which we also did 2 syringes. She did have some staining in right inner thigh area, so we avoided retreating this area. She has a lot of areas to treat but only wants to do 2 syringes at a time. She was given post treatment care instructions on handout and verbally. She will f/u if she has any questions or concerns.  Photos: Yes.    Compression stockings applied: Yes.

## 2023-05-29 ENCOUNTER — Other Ambulatory Visit: Payer: Self-pay

## 2023-05-29 ENCOUNTER — Emergency Department (HOSPITAL_BASED_OUTPATIENT_CLINIC_OR_DEPARTMENT_OTHER): Payer: Medicare Other | Admitting: Radiology

## 2023-05-29 ENCOUNTER — Emergency Department (HOSPITAL_BASED_OUTPATIENT_CLINIC_OR_DEPARTMENT_OTHER): Payer: Medicare Other

## 2023-05-29 ENCOUNTER — Encounter (HOSPITAL_BASED_OUTPATIENT_CLINIC_OR_DEPARTMENT_OTHER): Payer: Self-pay | Admitting: Emergency Medicine

## 2023-05-29 ENCOUNTER — Inpatient Hospital Stay (HOSPITAL_BASED_OUTPATIENT_CLINIC_OR_DEPARTMENT_OTHER)
Admission: EM | Admit: 2023-05-29 | Discharge: 2023-05-31 | DRG: 563 | Disposition: A | Discharge status: Home Health | Payer: Medicare Other | Attending: Internal Medicine | Admitting: Internal Medicine

## 2023-05-29 DIAGNOSIS — Z7901 Long term (current) use of anticoagulants: Secondary | ICD-10-CM | POA: Diagnosis not present

## 2023-05-29 DIAGNOSIS — Z79899 Other long term (current) drug therapy: Secondary | ICD-10-CM | POA: Diagnosis not present

## 2023-05-29 DIAGNOSIS — I10 Essential (primary) hypertension: Secondary | ICD-10-CM | POA: Diagnosis not present

## 2023-05-29 DIAGNOSIS — E871 Hypo-osmolality and hyponatremia: Secondary | ICD-10-CM | POA: Diagnosis not present

## 2023-05-29 DIAGNOSIS — E7800 Pure hypercholesterolemia, unspecified: Secondary | ICD-10-CM | POA: Diagnosis not present

## 2023-05-29 DIAGNOSIS — Z888 Allergy status to other drugs, medicaments and biological substances status: Secondary | ICD-10-CM

## 2023-05-29 DIAGNOSIS — Z803 Family history of malignant neoplasm of breast: Secondary | ICD-10-CM

## 2023-05-29 DIAGNOSIS — E559 Vitamin D deficiency, unspecified: Secondary | ICD-10-CM | POA: Diagnosis not present

## 2023-05-29 DIAGNOSIS — K219 Gastro-esophageal reflux disease without esophagitis: Secondary | ICD-10-CM | POA: Diagnosis present

## 2023-05-29 DIAGNOSIS — S8261XA Displaced fracture of lateral malleolus of right fibula, initial encounter for closed fracture: Secondary | ICD-10-CM | POA: Diagnosis not present

## 2023-05-29 DIAGNOSIS — Z923 Personal history of irradiation: Secondary | ICD-10-CM

## 2023-05-29 DIAGNOSIS — R0989 Other specified symptoms and signs involving the circulatory and respiratory systems: Secondary | ICD-10-CM | POA: Diagnosis not present

## 2023-05-29 DIAGNOSIS — W19XXXA Unspecified fall, initial encounter: Secondary | ICD-10-CM | POA: Diagnosis present

## 2023-05-29 DIAGNOSIS — F32A Depression, unspecified: Secondary | ICD-10-CM | POA: Diagnosis not present

## 2023-05-29 DIAGNOSIS — Z955 Presence of coronary angioplasty implant and graft: Secondary | ICD-10-CM

## 2023-05-29 DIAGNOSIS — Z8261 Family history of arthritis: Secondary | ICD-10-CM

## 2023-05-29 DIAGNOSIS — F419 Anxiety disorder, unspecified: Secondary | ICD-10-CM | POA: Diagnosis not present

## 2023-05-29 DIAGNOSIS — R609 Edema, unspecified: Secondary | ICD-10-CM | POA: Diagnosis not present

## 2023-05-29 DIAGNOSIS — E876 Hypokalemia: Secondary | ICD-10-CM | POA: Diagnosis not present

## 2023-05-29 DIAGNOSIS — Z853 Personal history of malignant neoplasm of breast: Secondary | ICD-10-CM | POA: Diagnosis not present

## 2023-05-29 DIAGNOSIS — S82891A Other fracture of right lower leg, initial encounter for closed fracture: Secondary | ICD-10-CM | POA: Diagnosis not present

## 2023-05-29 DIAGNOSIS — I252 Old myocardial infarction: Secondary | ICD-10-CM | POA: Diagnosis not present

## 2023-05-29 DIAGNOSIS — Z885 Allergy status to narcotic agent status: Secondary | ICD-10-CM | POA: Diagnosis not present

## 2023-05-29 DIAGNOSIS — R55 Syncope and collapse: Secondary | ICD-10-CM | POA: Diagnosis not present

## 2023-05-29 DIAGNOSIS — Z8249 Family history of ischemic heart disease and other diseases of the circulatory system: Secondary | ICD-10-CM

## 2023-05-29 DIAGNOSIS — E860 Dehydration: Secondary | ICD-10-CM | POA: Diagnosis present

## 2023-05-29 DIAGNOSIS — I2510 Atherosclerotic heart disease of native coronary artery without angina pectoris: Secondary | ICD-10-CM | POA: Diagnosis present

## 2023-05-29 DIAGNOSIS — Z791 Long term (current) use of non-steroidal anti-inflammatories (NSAID): Secondary | ICD-10-CM | POA: Diagnosis not present

## 2023-05-29 DIAGNOSIS — I4891 Unspecified atrial fibrillation: Secondary | ICD-10-CM | POA: Diagnosis not present

## 2023-05-29 DIAGNOSIS — Y92000 Kitchen of unspecified non-institutional (private) residence as  the place of occurrence of the external cause: Secondary | ICD-10-CM | POA: Diagnosis not present

## 2023-05-29 DIAGNOSIS — S82451A Displaced comminuted fracture of shaft of right fibula, initial encounter for closed fracture: Secondary | ICD-10-CM | POA: Diagnosis not present

## 2023-05-29 DIAGNOSIS — Z1152 Encounter for screening for COVID-19: Secondary | ICD-10-CM

## 2023-05-29 DIAGNOSIS — Z66 Do not resuscitate: Secondary | ICD-10-CM | POA: Diagnosis not present

## 2023-05-29 DIAGNOSIS — M7731 Calcaneal spur, right foot: Secondary | ICD-10-CM | POA: Diagnosis not present

## 2023-05-29 DIAGNOSIS — I4892 Unspecified atrial flutter: Secondary | ICD-10-CM | POA: Diagnosis not present

## 2023-05-29 DIAGNOSIS — Z7982 Long term (current) use of aspirin: Secondary | ICD-10-CM

## 2023-05-29 DIAGNOSIS — D71 Functional disorders of polymorphonuclear neutrophils: Secondary | ICD-10-CM | POA: Diagnosis not present

## 2023-05-29 DIAGNOSIS — Z823 Family history of stroke: Secondary | ICD-10-CM

## 2023-05-29 LAB — CBC WITH DIFFERENTIAL/PLATELET
Abs Immature Granulocytes: 0.03 10*3/uL (ref 0.00–0.07)
Basophils Absolute: 0 10*3/uL (ref 0.0–0.1)
Basophils Relative: 0 %
Eosinophils Absolute: 0.1 10*3/uL (ref 0.0–0.5)
Eosinophils Relative: 1 %
HCT: 41.2 % (ref 36.0–46.0)
Hemoglobin: 14.1 g/dL (ref 12.0–15.0)
Immature Granulocytes: 0 %
Lymphocytes Relative: 12 %
Lymphs Abs: 1.3 10*3/uL (ref 0.7–4.0)
MCH: 29.2 pg (ref 26.0–34.0)
MCHC: 34.2 g/dL (ref 30.0–36.0)
MCV: 85.3 fL (ref 80.0–100.0)
Monocytes Absolute: 1.1 10*3/uL — ABNORMAL HIGH (ref 0.1–1.0)
Monocytes Relative: 11 %
Neutro Abs: 8.2 10*3/uL — ABNORMAL HIGH (ref 1.7–7.7)
Neutrophils Relative %: 76 %
Platelets: 351 10*3/uL (ref 150–400)
RBC: 4.83 MIL/uL (ref 3.87–5.11)
RDW: 16 % — ABNORMAL HIGH (ref 11.5–15.5)
WBC: 10.7 10*3/uL — ABNORMAL HIGH (ref 4.0–10.5)
nRBC: 0 % (ref 0.0–0.2)

## 2023-05-29 LAB — BASIC METABOLIC PANEL
Anion gap: 9 (ref 5–15)
BUN: 20 mg/dL (ref 8–23)
CO2: 29 mmol/L (ref 22–32)
Calcium: 9.5 mg/dL (ref 8.9–10.3)
Chloride: 86 mmol/L — ABNORMAL LOW (ref 98–111)
Creatinine, Ser: 1.04 mg/dL — ABNORMAL HIGH (ref 0.44–1.00)
GFR, Estimated: 56 mL/min — ABNORMAL LOW (ref >60)
Glucose, Bld: 126 mg/dL — ABNORMAL HIGH (ref 70–99)
Potassium: 4.3 mmol/L (ref 3.5–5.1)
Sodium: 124 mmol/L — ABNORMAL LOW (ref 135–145)

## 2023-05-29 LAB — URINALYSIS, ROUTINE W REFLEX MICROSCOPIC
Bilirubin Urine: NEGATIVE
Glucose, UA: NEGATIVE mg/dL
Hgb urine dipstick: NEGATIVE
Leukocytes,Ua: NEGATIVE
Nitrite: NEGATIVE
Protein, ur: NEGATIVE mg/dL
Specific Gravity, Urine: 1.009 (ref 1.005–1.030)
pH: 7 (ref 5.0–8.0)

## 2023-05-29 LAB — RESP PANEL BY RT-PCR (RSV, FLU A&B, COVID)  RVPGX2
Influenza A by PCR: NEGATIVE
Influenza B by PCR: NEGATIVE
Resp Syncytial Virus by PCR: NEGATIVE
SARS Coronavirus 2 by RT PCR: NEGATIVE

## 2023-05-29 LAB — CREATININE, URINE, RANDOM: Creatinine, Urine: 72 mg/dL

## 2023-05-29 LAB — MAGNESIUM: Magnesium: 2 mg/dL (ref 1.7–2.4)

## 2023-05-29 LAB — OSMOLALITY: Osmolality: 280 mosm/kg (ref 275–295)

## 2023-05-29 LAB — NA AND K (SODIUM & POTASSIUM), RAND UR
Potassium Urine: 28 mmol/L
Sodium, Ur: 32 mmol/L

## 2023-05-29 MED ORDER — SODIUM CHLORIDE 0.9 % IV BOLUS
1000.0000 mL | Freq: Once | INTRAVENOUS | Status: AC
  Administered 2023-05-29: 1000 mL via INTRAVENOUS

## 2023-05-29 MED ORDER — ROSUVASTATIN CALCIUM 5 MG PO TABS
10.0000 mg | ORAL_TABLET | Freq: Every day | ORAL | Status: DC
  Filled 2023-05-29: qty 2

## 2023-05-29 MED ORDER — APIXABAN 5 MG PO TABS
5.0000 mg | ORAL_TABLET | Freq: Two times a day (BID) | ORAL | Status: DC
  Administered 2023-05-29 – 2023-05-31 (×4): 5 mg via ORAL
  Filled 2023-05-29: qty 2
  Filled 2023-05-29: qty 1
  Filled 2023-05-29: qty 2
  Filled 2023-05-29: qty 1

## 2023-05-29 MED ORDER — ESCITALOPRAM OXALATE 10 MG PO TABS
10.0000 mg | ORAL_TABLET | Freq: Every day | ORAL | Status: DC
  Filled 2023-05-29 (×2): qty 1

## 2023-05-29 MED ORDER — PROPRANOLOL HCL 80 MG PO TABS
80.0000 mg | ORAL_TABLET | Freq: Two times a day (BID) | ORAL | Status: DC
Start: 2023-05-29 — End: 2023-05-31
  Administered 2023-05-30 – 2023-05-31 (×2): 80 mg via ORAL
  Filled 2023-05-29 (×2): qty 1
  Filled 2023-05-29 (×2): qty 4
  Filled 2023-05-29: qty 1

## 2023-05-29 MED ORDER — OXYCODONE-ACETAMINOPHEN 5-325 MG PO TABS
1.0000 | ORAL_TABLET | Freq: Once | ORAL | Status: AC
  Administered 2023-05-29: 1 via ORAL
  Filled 2023-05-29: qty 1

## 2023-05-29 NOTE — ED Notes (Addendum)
 Propranolol not in pyxis at this facility, MD notified. Ok per MD to skip dose, no new orders.

## 2023-05-29 NOTE — ED Triage Notes (Signed)
 Pt arrived via [facility] from home c/o R ankle pain from mechanical fall this afternoon, pt did not hit head, no LOC, denies blood thinner med. Swelling to R ankle.   [facility]S VS  BP 132/82  HR 76  SpO2 98% RA  CBG 146

## 2023-05-29 NOTE — ED Triage Notes (Signed)
 Pt via ems from home with right ankle pain after a fall today. Pt reports that she doesn't know why she fell, but did not lose consciousness or hit her head (husband was present and witnessed fall). Pt states she has had a GI but since Friday and feels dehydrated. Pt alert & oriented, nad noted.

## 2023-05-29 NOTE — ED Provider Notes (Signed)
 [place] [facility]ERGENCY DEPARTMENT AT [facility] Provider Note   CSN: [id number] Arrival date & time: 05/29/23  1555     History  Chief Complaint  Patient presents with   [place]    Sarah Hendricks is a 78 y.o. female with a history of coronary disease, MI, on aspirin, high cholesterol, borderline hypertension on Cozaar , presenting to the ED with complaint of fall and ankle pain.  Patient reports she has had a GI bug for the past 3 days, with nausea, vomiting, poor appetite, no diarrhea.  She feels that she is getting dehydrated, poor oral liquid intake.  She does today she was in her kitchen and began to feel lightheaded and may have fallen, twisting her ankle.  She was unable to stand up afterwards due to pain in her ankle.  She denies syncope.  She does report a prior history of syncope or near syncope in settings of pain or stress.  She is here with her son-in-law at the bedside.    HPI     Home Medications Prior to Admission medications   Medication Sig Start Date End Date Taking? Authorizing Provider  aspirin 81 MG tablet Take 81 mg by mouth daily.    [provider]  calcium  carbonate (OS-CAL) 600 MG TABS tablet Take 600 mg by mouth 2 (two) times daily with a meal.     [provider]  Cholecalciferol  (EQL VITAMIN D3) 1000 units tablet Take 1 tablet (1,000 Units total) by mouth daily. 06/04/15   Geofm Glade PARAS, MD  escitalopram  (LEXAPRO ) 10 MG tablet TAKE 1 TABLET(10 MG) BY MOUTH DAILY 03/25/23   Geofm Glade PARAS, MD  hydrochlorothiazide  (HYDRODIURIL ) 12.5 MG tablet Take 12.5 mg by mouth daily. 01/23/23   [provider]  losartan  (COZAAR ) 100 MG tablet TAKE 1 TABLET(100 MG) BY MOUTH DAILY 11/26/22   Hobart Powell BRAVO, MD  meloxicam  (MOBIC ) 15 MG tablet Take 15 mg by mouth daily. 02/24/23   [provider]  Omega-3 Fatty Acids (FISH OIL) 1200 MG CAPS Take by mouth daily.    [provider]  propranolol  (INDERAL ) 80 MG tablet Take  1 tablet (80 mg total) by mouth 2 (two) times daily. 11/26/22   Hobart Powell BRAVO, MD  rosuvastatin  (CRESTOR ) 10 MG tablet Take 1 tablet (10 mg total) by mouth daily. 11/26/22   Hobart Powell BRAVO, MD  vitamin k 100 MCG tablet Take 100 mcg by mouth. Take 1 capsule po every other day for bones    [provider]      Allergies    Acetaminophen -codeine, Tigan [trimethobenzamide], Alendronate, Atorvastatin , and Codeine    Review of Systems   Review of Systems  Physical Exam Updated Vital Signs BP 121/67   Pulse 65   Temp 97.9 F (36.6 C) (Oral)   Resp 17   Ht 5' 3 (1.6 m)   Wt 70.8 kg   SpO2 96%   BMI 27.65 kg/m  Physical Exam Constitutional:      General: She is not in acute distress. HENT:     Head: Normocephalic and atraumatic.  Eyes:     Conjunctiva/sclera: Conjunctivae normal.     Pupils: Pupils are equal, round, and reactive to light.  Cardiovascular:     Rate and Rhythm: Normal rate. Rhythm irregular.  Pulmonary:     Effort: Pulmonary effort is normal. No respiratory distress.  Abdominal:     General: There is no distension.     Tenderness: There is no abdominal tenderness.  Musculoskeletal:     Comments: Swelling with tenderness of the left medial and lateral malleoli, no tenderness with palpation of the feet, no bony tenderness or injuries noted anywhere else on musculoskeletal exam  Skin:    General: Skin is warm and dry.  Neurological:     General: No focal deficit present.     Mental Status: She is alert. Mental status is at baseline.  Psychiatric:        Mood and Affect: Mood normal.        Behavior: Behavior normal.     ED Results / Procedures / Treatments   Labs (all labs ordered are listed, but only abnormal results are displayed) Labs Reviewed  BASIC METABOLIC PANEL - Abnormal; Notable for the following components:      Result Value   Sodium 124 (*)    Chloride 86 (*)    Glucose, Bld 126 (*)    Creatinine, Ser 1.04 (*)    GFR,  Estimated 56 (*)    All other components within normal limits  CBC WITH DIFFERENTIAL/PLATELET - Abnormal; Notable for the following components:   WBC 10.7 (*)    RDW 16.0 (*)    Neutro Abs 8.2 (*)    Monocytes Absolute 1.1 (*)    All other components within normal limits  URINALYSIS, ROUTINE W REFLEX MICROSCOPIC - Abnormal; Notable for the following components:   Ketones, ur TRACE (*)    All other components within normal limits  RESP PANEL BY RT-PCR (RSV, FLU A&B, COVID)  RVPGX2  MAGNESIUM  OSMOLALITY  NA AND K (SODIUM & POTASSIUM), RAND UR  CREATININE, URINE, RANDOM    EKG EKG Interpretation Date/Time:  Sunday May 29 2023 16:35:59 EST Ventricular Rate:  94 PR Interval:    QRS Duration:  92 QT Interval:  342 QTC Calculation: 428 R Axis:   -7  Text Interpretation: Atrial fibrillation LVH with secondary repolarization abnormality Inferior infarct, old Anterior infarct, old Confirmed by Cottie Cough [phone]) on 05/29/2023 4:44:09 PM  Radiology DG Ankle Complete Right Result Date: 05/29/2023 CLINICAL DATA:  Fall, right ankle pain EXAM: RIGHT ANKLE - COMPLETE 3+ VIEW COMPARISON:  Right foot x-ray 10/20/2015 FINDINGS: Acute mildly comminuted fracture of the distal fibular metaphysis with extension to the ankle mortise. 3 mm of lateral displacement. Chronic appearing deformity at the tip of the medial malleolus without acute fracture. No posterior malleolar fracture. Remaining osseous structures appear otherwise intact. No dislocation. Plantar calcaneal spur. Soft tissue swelling most pronounced at the anterior and lateral aspects of the ankle. IMPRESSION: Acute mildly comminuted and displaced fracture of the lateral malleolus. Electronically Signed   By: Mabel Converse D.O.   On: 05/29/2023 17:13   DG Chest Portable 1 View Result Date: 05/29/2023 CLINICAL DATA:  Near syncope EXAM: PORTABLE CHEST 1 VIEW COMPARISON:  None Available. FINDINGS: Calcified mediastinal and hilar lymph  nodes compatible with old granulomatous disease. Low lung volumes are present, causing crowding of the pulmonary vasculature. Heart size within normal limits. Coronary atherosclerosis and possible stenting. Clips project over the left lateral axilla/breast. The lungs appear clear.  Mild bony demineralization. IMPRESSION: 1. Low lung volumes, causing crowding of the pulmonary vasculature. 2. Coronary atherosclerosis and possible stenting. 3. Old granulomatous disease. Electronically Signed   By: Ryan Salvage M.D.   On: 05/29/2023 17:09    Procedures Procedures    Medications Ordered in ED Medications  apixaban  (ELIQUIS ) tablet 5 mg (5 mg Oral Given 05/29/23 2117)  sodium chloride  0.9 %  bolus 1,000 mL (0 mLs Intravenous Stopped 05/29/23 1905)  oxyCODONE -acetaminophen  (PERCOCET/ROXICET) 5-325 MG per tablet 1 tablet (1 tablet Oral Given 05/29/23 1743)    ED Course/ Medical Decision Making/ A&P Clinical Course as of 05/29/23 2158  Sun May 29, 2023  1706 BP 80/60, mentating well, HR 90's and irregular/A Fib [MT]  1734 BP normalized [MT]  1735 Chadsvasc2 score of 6 [MT]  1932 Admitted to hospitalist Dr Shona [MT]    Clinical Course User Index [MT] Cottie Donnice PARAS, MD                                 Medical Decision Making Amount and/or Complexity of Data Reviewed Labs: ordered. Radiology: ordered.  Risk Prescription drug management. Decision regarding hospitalization.   This patient presents to the ED with concern for lightheadedness, fall, ankle pain. This involves an extensive number of treatment options, and is a complaint that carries with it a high risk of complications and morbidity.  The differential diagnosis includes ankle fracture versus sprain versus other  It is not clear why the patient fell today.  On repeated questioning, she may have had lightheadedness, but this may also been mechanical.  She does not remember.  But there was no loss of consciousness.  With her  history this may have been a vagal episode given that she has had a GI bug and symptoms for the past several days.  We will check for dehydration  She does not have chest pain or pressure or hypoxia to suggest PE or aortic dissection or ACS.  Co-morbidities that complicate the patient evaluation: Cardiovascular risk factors, hypertension, hyperlipidemia  Additional history obtained from family member at bedside   I ordered and personally interpreted labs.  The pertinent results include: Hyponatremia (likely multifactorial from HCTZ use as well as poor oral intake, acute on chronic), no other emergent findings on lab work.  I ordered imaging studies including the chest, x-ray of the right ankle I independently visualized and interpreted imaging which showed acute mildly displaced right malleolar or lateral malleolar fracture I agree with the radiologist interpretation  The patient was maintained on a cardiac monitor.  I personally viewed and interpreted the cardiac monitored which showed an underlying rhythm of: A-fib, rate controlled  Per my interpretation the patient's ECG shows atrial fibrillation without acute ischemic findings  I ordered medication including Eliquis  initiate anticoagulation for atrial fibrillation, new onset.  Percocet given for pain.  Patient was placed in a posterior leg splint.  She can be nonweightbearing or minimally weightbearing with crutches or walker if she is able to stand.  She will need outpatient follow-up with orthopedics, but this ankle fracture is not an emergent surgical fracture.  Patient is also noted to be in new onset A-fib today.  She is asymptomatic with this, and is not certain how long she has been in A-fib.  Her CHA2DS2-VASc 2 score is 6, high risk for stroke, and she would benefit from anticoagulation.  We'll start eliquis  - stop aspirin.    Although we considered outpatient management, with this constellation of symptoms including hyponatremia,  difficulty bearing weight, and now new onset A-fib on anticoagulation, is a fall risk, I do think she would benefit from medical admission.  Her heart rate has remained controlled on its own here in the ED, not requiring rate control medications.  The patient, and her entire family were in agreement with this plan.  She would likely benefit from PT evaluation in the hospital to ensure that she is stable with her broken ankle prior to discharge home.  Although she would ideally be nonweightbearing, she can do light weightbearing in a CAM boot with a walker.  She prefers to follow-up with her own orthopedic surgeon.  I have reviewed the patients home medicines and have made adjustments as needed  Test Considered: Low suspicion for acute PE.  No indication for CT angiogram   Dispostion:         Final Clinical Impression(s) / ED Diagnoses Final diagnoses:  Hyponatremia  Closed fracture of right ankle, initial encounter  Atrial fib/flutter, transient [facility])    Rx / DC Orders ED Discharge Orders     None         Cottie Donnice PARAS, MD 05/29/23 2158

## 2023-05-30 ENCOUNTER — Inpatient Hospital Stay (HOSPITAL_COMMUNITY): Payer: Medicare Other

## 2023-05-30 DIAGNOSIS — F419 Anxiety disorder, unspecified: Secondary | ICD-10-CM | POA: Diagnosis present

## 2023-05-30 DIAGNOSIS — Z1152 Encounter for screening for COVID-19: Secondary | ICD-10-CM | POA: Diagnosis not present

## 2023-05-30 DIAGNOSIS — E871 Hypo-osmolality and hyponatremia: Secondary | ICD-10-CM | POA: Diagnosis present

## 2023-05-30 DIAGNOSIS — S82891A Other fracture of right lower leg, initial encounter for closed fracture: Secondary | ICD-10-CM | POA: Diagnosis not present

## 2023-05-30 DIAGNOSIS — Z955 Presence of coronary angioplasty implant and graft: Secondary | ICD-10-CM | POA: Diagnosis not present

## 2023-05-30 DIAGNOSIS — Z79899 Other long term (current) drug therapy: Secondary | ICD-10-CM | POA: Diagnosis not present

## 2023-05-30 DIAGNOSIS — I4891 Unspecified atrial fibrillation: Secondary | ICD-10-CM

## 2023-05-30 DIAGNOSIS — W19XXXA Unspecified fall, initial encounter: Secondary | ICD-10-CM

## 2023-05-30 DIAGNOSIS — I2510 Atherosclerotic heart disease of native coronary artery without angina pectoris: Secondary | ICD-10-CM | POA: Diagnosis present

## 2023-05-30 DIAGNOSIS — S8261XA Displaced fracture of lateral malleolus of right fibula, initial encounter for closed fracture: Secondary | ICD-10-CM | POA: Diagnosis present

## 2023-05-30 DIAGNOSIS — E559 Vitamin D deficiency, unspecified: Secondary | ICD-10-CM | POA: Diagnosis present

## 2023-05-30 DIAGNOSIS — Z791 Long term (current) use of non-steroidal anti-inflammatories (NSAID): Secondary | ICD-10-CM | POA: Diagnosis not present

## 2023-05-30 DIAGNOSIS — Z885 Allergy status to narcotic agent status: Secondary | ICD-10-CM | POA: Diagnosis not present

## 2023-05-30 DIAGNOSIS — Z853 Personal history of malignant neoplasm of breast: Secondary | ICD-10-CM | POA: Diagnosis not present

## 2023-05-30 DIAGNOSIS — E7800 Pure hypercholesterolemia, unspecified: Secondary | ICD-10-CM | POA: Diagnosis present

## 2023-05-30 DIAGNOSIS — I10 Essential (primary) hypertension: Secondary | ICD-10-CM | POA: Diagnosis present

## 2023-05-30 DIAGNOSIS — F32A Depression, unspecified: Secondary | ICD-10-CM | POA: Diagnosis present

## 2023-05-30 DIAGNOSIS — I252 Old myocardial infarction: Secondary | ICD-10-CM | POA: Diagnosis not present

## 2023-05-30 DIAGNOSIS — Z923 Personal history of irradiation: Secondary | ICD-10-CM | POA: Diagnosis not present

## 2023-05-30 DIAGNOSIS — E860 Dehydration: Secondary | ICD-10-CM | POA: Diagnosis present

## 2023-05-30 DIAGNOSIS — Z888 Allergy status to other drugs, medicaments and biological substances status: Secondary | ICD-10-CM | POA: Diagnosis not present

## 2023-05-30 DIAGNOSIS — Y92000 Kitchen of unspecified non-institutional (private) residence as  the place of occurrence of the external cause: Secondary | ICD-10-CM | POA: Diagnosis not present

## 2023-05-30 DIAGNOSIS — Z66 Do not resuscitate: Secondary | ICD-10-CM | POA: Diagnosis present

## 2023-05-30 DIAGNOSIS — R55 Syncope and collapse: Secondary | ICD-10-CM | POA: Diagnosis present

## 2023-05-30 DIAGNOSIS — Z7901 Long term (current) use of anticoagulants: Secondary | ICD-10-CM | POA: Diagnosis not present

## 2023-05-30 DIAGNOSIS — E876 Hypokalemia: Secondary | ICD-10-CM | POA: Diagnosis present

## 2023-05-30 DIAGNOSIS — I4892 Unspecified atrial flutter: Secondary | ICD-10-CM | POA: Diagnosis present

## 2023-05-30 LAB — COMPREHENSIVE METABOLIC PANEL
ALT: 13 U/L (ref 0–44)
AST: 18 U/L (ref 15–41)
Albumin: 3 g/dL — ABNORMAL LOW (ref 3.5–5.0)
Alkaline Phosphatase: 59 U/L (ref 38–126)
Anion gap: 10 (ref 5–15)
BUN: 13 mg/dL (ref 8–23)
CO2: 27 mmol/L (ref 22–32)
Calcium: 9 mg/dL (ref 8.9–10.3)
Chloride: 91 mmol/L — ABNORMAL LOW (ref 98–111)
Creatinine, Ser: 0.88 mg/dL (ref 0.44–1.00)
GFR, Estimated: >60 mL/min (ref >60)
Glucose, Bld: 113 mg/dL — ABNORMAL HIGH (ref 70–99)
Potassium: 3.2 mmol/L — ABNORMAL LOW (ref 3.5–5.1)
Sodium: 128 mmol/L — ABNORMAL LOW (ref 135–145)
Total Bilirubin: 1.1 mg/dL (ref 0.0–1.2)
Total Protein: 5.6 g/dL — ABNORMAL LOW (ref 6.5–8.1)

## 2023-05-30 LAB — PROTIME-INR
INR: 1.3 — ABNORMAL HIGH (ref 0.8–1.2)
Prothrombin Time: 16 s — ABNORMAL HIGH (ref 11.4–15.2)

## 2023-05-30 LAB — ECHOCARDIOGRAM COMPLETE
Area-P 1/2: 4.24 cm2
Calc EF: 45.8 %
Height: 63 in
S' Lateral: 2.6 cm
Single Plane A2C EF: 37.9 %
Single Plane A4C EF: 50.5 %
Weight: 2497.37 [oz_av]

## 2023-05-30 LAB — VITAMIN B12: Vitamin B-12: 504 pg/mL (ref 180–914)

## 2023-05-30 LAB — PHOSPHORUS: Phosphorus: 3.1 mg/dL (ref 2.5–4.6)

## 2023-05-30 LAB — VITAMIN D 25 HYDROXY (VIT D DEFICIENCY, FRACTURES): Vit D, 25-Hydroxy: 31.24 ng/mL (ref 30–100)

## 2023-05-30 LAB — TSH: TSH: 1.262 u[IU]/mL (ref 0.350–4.500)

## 2023-05-30 MED ORDER — NALOXONE HCL 0.4 MG/ML IJ SOLN: 0.4000 mg | INTRAMUSCULAR | Status: DC | PRN

## 2023-05-30 MED ORDER — POTASSIUM CHLORIDE CRYS ER 20 MEQ PO TBCR
40.0000 meq | EXTENDED_RELEASE_TABLET | Freq: Once | ORAL | Status: AC
  Administered 2023-05-30: 40 meq via ORAL
  Filled 2023-05-30: qty 2

## 2023-05-30 MED ORDER — ACETAMINOPHEN 325 MG PO TABS
650.0000 mg | ORAL_TABLET | Freq: Four times a day (QID) | ORAL | Status: DC | PRN
  Administered 2023-05-30 – 2023-05-31 (×3): 650 mg via ORAL
  Filled 2023-05-30 (×3): qty 2

## 2023-05-30 MED ORDER — PERFLUTREN LIPID MICROSPHERE
1.0000 mL | INTRAVENOUS | Status: AC | PRN
  Administered 2023-05-30: 1 mL via INTRAVENOUS

## 2023-05-30 MED ORDER — OXYCODONE HCL 5 MG PO TABS
5.0000 mg | ORAL_TABLET | Freq: Four times a day (QID) | ORAL | Status: DC | PRN
Start: 2023-05-30 — End: 2023-05-31
  Administered 2023-05-30: 5 mg via ORAL
  Filled 2023-05-30: qty 1

## 2023-05-30 MED ORDER — POLYETHYLENE GLYCOL 3350 17 G PO PACK: 17.0000 g | PACK | Freq: Every day | ORAL | Status: DC | PRN

## 2023-05-30 MED ORDER — SODIUM CHLORIDE 0.9 % IV BOLUS
1000.0000 mL | Freq: Once | INTRAVENOUS | Status: AC
  Administered 2023-05-30: 1000 mL via INTRAVENOUS

## 2023-05-30 MED ORDER — HYDROMORPHONE HCL 1 MG/ML IJ SOLN
0.5000 mg | INTRAMUSCULAR | Status: DC | PRN
Start: 2023-05-30 — End: 2023-05-31
  Administered 2023-05-30: 0.5 mg via INTRAVENOUS
  Filled 2023-05-30: qty 1

## 2023-05-30 MED ORDER — LOSARTAN POTASSIUM 50 MG PO TABS
100.0000 mg | ORAL_TABLET | Freq: Every day | ORAL | Status: DC
  Administered 2023-05-31: 100 mg via ORAL
  Filled 2023-05-30: qty 2

## 2023-05-30 MED ORDER — ADULT MULTIVITAMIN W/MINERALS CH
ORAL_TABLET | Freq: Every day | ORAL | Status: DC
Start: 2023-05-30 — End: 2023-05-31
  Administered 2023-05-30 – 2023-05-31 (×2): 1 via ORAL
  Filled 2023-05-30 (×2): qty 1

## 2023-05-30 MED ORDER — ONDANSETRON HCL 4 MG/2ML IJ SOLN: 4.0000 mg | Freq: Four times a day (QID) | INTRAMUSCULAR | Status: DC | PRN

## 2023-05-30 MED ORDER — VITAMIN D 25 MCG (1000 UNIT) PO TABS
1000.0000 [IU] | ORAL_TABLET | Freq: Every day | ORAL | Status: DC
  Administered 2023-05-30 – 2023-05-31 (×2): 1000 [IU] via ORAL
  Filled 2023-05-30 (×2): qty 1

## 2023-05-30 NOTE — ED Notes (Signed)
 Pt up to bedside commode. Pt able to stand and pivot using only her left leg and 2 person assist.

## 2023-05-30 NOTE — H&P (Signed)
 History and Physical    Patient: Sarah Hendricks FMW:[id number] DOB: Mar 20, 1946 DOA: 05/29/2023 DOS: the patient was seen and examined on 05/30/2023 PCP: Geofm Glade PARAS, MD  Patient coming from: [facility]  Chief Complaint:  Chief Complaint  Patient presents with   Fall   HPI: Tiaria Biby is a 78 y.o. female with medical history significant of osteoarthritis, CAD/MI s/p PCI, HTN, HLD breast cancer s/p lumpectomy and radiation, GERD and vitamin D  deficiency who presented to the [facility] ED for evaluation for right ankle pain after a fall. Patient reports that she had a GI bug over the last 3 days with associated nausea and vomiting and poor appetite but no diarrhea. She became very dehydrated due to poor oral liquid intake. On Sunday, while she was in her kitchen, she began to feel lightheadedness and fell twisting her right ankle. She was unable to stand up due to pain in her ankles. During my evaluation, patient reports improvement in her right ankle pain but feels tired and drowsy after receiving pain medicine. She denies loss of consciousness, shortness of breath, chest pain, palpitations, vision changes, headaches, fevers or chills.  [facility] ED course: Initial vitals with temp 98, RR 20, HR 93, BP 131/84, SpO2 96% on room air Labs show sodium 124, K+ 4.3, glucose 126, creatinine 1.04, WBC 10.7, Hgb 14.1, platelet 351, mag 2.0, serum osmole 280, urine sodium 32, urine potassium 28, urine creatinine 72, UA shows mild ketonuria but no signs of infection, negative COVID, flu and RSV test, EKG showed A-fib with a rate of 94 and LVH Chest x-ray shows old granulomatous disease but no acute cardiopulmonary disease X-ray of the right ankle shows acute mildly comminuted and displaced fracture of the lateral malleolus Patient was given IV LR 1 L bolus Percocet 5-325 mg x 1 and started on Eliquis  5 mg twice daily Patient was placed in a posterior leg splint Patient was admitted to [facility]  service and transferred to [facility]  Review of Systems: As mentioned in the history of present illness. All other systems reviewed and are negative. Past Medical History:  Diagnosis Date   Allergy    Arthritis    Breast cancer (HCC) 1994   lumpectomy and XRT, no chemo   CAD (coronary artery disease) 2008   PTCA, in [place]   GERD (gastroesophageal reflux disease)    History of chicken pox    Hyperlipidemia    Hypertension    Myocardial infarction (HCC) 03/15/2007   stent placed   Vitamin D  deficiency    Past Surgical History:  Procedure Laterality Date   BREAST BIOPSY Left 1994   BREAST LUMPECTOMY Left 1994   CATARACT EXTRACTION, BILATERAL     CORONARY ANGIOPLASTY WITH STENT PLACEMENT  2008   CRANIECTOMY FOR DEPRESSED SKULL FRACTURE  1986   TONSILLECTOMY  1966   Social History:  reports that she has never smoked. She has never used smokeless tobacco. She reports that she does not drink alcohol and does not use drugs.  Allergies  Allergen Reactions   Acetaminophen -Codeine Other (See Comments)    Bad dreams   Tigan [Trimethobenzamide] Other (See Comments)    Bad dreams   Alendronate     Caused leg pains    Atorvastatin  Other (See Comments)    Pt reports causes bilateral leg pains.   Codeine     Bad dreams    Family History  Problem Relation Age of Onset   Breast cancer Mother    Hypertension Mother  Heart disease Mother    Stroke Mother    Arthritis Father    Heart disease Father        died at 16 of heart attack   Heart disease Brother    Stroke Maternal Grandmother    Heart disease Maternal Grandfather    Colon cancer Neg Hx    Esophageal cancer Neg Hx    Rectal cancer Neg Hx    Stomach cancer Neg Hx     Prior to Admission medications   Medication Sig Start Date End Date Taking? Authorizing Provider  aspirin 81 MG tablet Take 81 mg by mouth daily.    [provider]  calcium  carbonate (OS-CAL) 600 MG TABS tablet Take 600 mg by mouth 2 (two) times  daily with a meal.     [provider]  Cholecalciferol  (EQL VITAMIN D3) 1000 units tablet Take 1 tablet (1,000 Units total) by mouth daily. 06/04/15   Geofm Glade PARAS, MD  escitalopram  (LEXAPRO ) 10 MG tablet TAKE 1 TABLET(10 MG) BY MOUTH DAILY 03/25/23   Geofm Glade PARAS, MD  losartan  (COZAAR ) 100 MG tablet TAKE 1 TABLET(100 MG) BY MOUTH DAILY 11/26/22   Hobart Powell BRAVO, MD  meloxicam  (MOBIC ) 15 MG tablet Take 15 mg by mouth daily. 02/24/23   [provider]  Omega-3 Fatty Acids (FISH OIL) 1200 MG CAPS Take by mouth daily.    [provider]  propranolol  (INDERAL ) 80 MG tablet Take 1 tablet (80 mg total) by mouth 2 (two) times daily. 11/26/22   Hobart Powell BRAVO, MD  rosuvastatin  (CRESTOR ) 10 MG tablet Take 1 tablet (10 mg total) by mouth daily. 11/26/22   Hobart Powell BRAVO, MD  vitamin k 100 MCG tablet Take 100 mcg by mouth. Take 1 capsule po every other day for bones    [provider]    Physical Exam: Vitals:   05/30/23 0900 05/30/23 1000 05/30/23 1048 05/30/23 1123  BP: 122/89 126/81  115/69  Pulse: 75 67  81  Resp: 16 16    Temp:   98.1 F (36.7 C) 97.6 F (36.4 C)  TempSrc:    Oral  SpO2: 98% 95%  96%  Weight:      Height:       General: Pleasant, well-appearing elderly woman laying in bed. No acute distress. HEENT: [place]/AT. Anicteric sclera. Dry mucous membrane. CV: Regular rate. Irregularly irregular rhythm. No murmurs, rubs, or gallops. No LE edema Pulmonary: Lungs CTAB. Normal effort. No wheezing or rales. Abdominal: Soft, nontender, nondistended. Normal bowel sounds. MSK: RLE in a splint. Neurovascularly intact distally. Palpable left pedal pulses. Skin: Warm and dry. No obvious rash or lesions. Neuro: A&Ox3. Moves all extremities. Normal sensation to light touch. No focal deficit. Psych: Normal mood and affect  Data Reviewed: Labs show sodium 124, K+ 4.3, glucose 126, creatinine 1.04, WBC 10.7, Hgb 14.1, platelet 351, mag 2.0,  serum osmole 280, urine sodium 32, urine potassium 28, urine creatinine 72, UA shows mild ketonuria but no signs of infection, negative COVID, flu and RSV test, EKG showed A-fib with a rate of 94 and LVH Chest x-ray shows old granulomatous disease but no acute cardiopulmonary disease X-ray of the right ankle shows acute mildly comminuted and displaced fracture of the lateral malleolus  Labs today show sodium 128, K+ 3.2, creatinine 0.88, Phos 3.1, PT/INR 16.0/1.3, vitamin D  31.2,  Assessment and Plan: Levie Owensby is a 78 y.o. female with medical history significant of osteoarthritis, CAD/MI s/p PCI, HTN, HLD,  breast cancer s/p lumpectomy and radiation, GERD and vitamin D  deficiency who presented to the [facility] ED for evaluation for right ankle pain after a fall and found to have right ankle fracture and new-onset A-fib  # Fall # Syncope # Right ankle fracture Elderly patient with recent history of gastroenteritis complicated by dehydration presented to the ER after feeling lightheaded and falling while in her kitchen.  Imaging of her right ankle shows acute mildly comminuted displaced fracture of the lateral malleolus. She is currently in a posterior splint with improvement in her right ankle pain.  Patient unable to discharge home safely from the ER.  Will admit for PT/OT eval -PT/OT consulted, pending eval and recs -WBAT -Pain control with as needed Percocet and IV Dilaudid  -Fall precautions -Outpatient follow-up with orthopedic surgery, patient prefers to see her own orthopedic surgeon  # New onset A-fib Patient presented after a fall and found to have new onset of A-fib. Heart rate stable in the 70s to 90s. Patient hemodynamically stable. She denies any palpitations, chest pain or dizziness at the moment. Anticoagulation initiated in the ED due to CHA2DS2-VASc 2 score of 6. Normal mag and Phos. Last TTE in July 2024 showed mildly reduced EF 45-50%, regional wall motion abnormalities,  G2DD, moderately dilated LA. -Follow-up repeat TTE, will need inpatient cardiology consult if further decrease in EF -Continue Eliquis  5 mg daily -Continue propranolol  -Follow-up TSH -Follow-up with A-fib clinic in the outpatient  # Hyponatremia Patient with recent gastroenteritis complicated by dehydration to have sodium of 124 on admission. Status post 1 L IV NS in the ER. Sodium improved to 128 today. -Give additional IV NS 1 L bolus -Follow-up morning BMP  # Mild hypokalemia K+ slightly low at 3.2. -Give KCl 40 mEq x 1  # HTN -BP stable with SBP in the 110s to 130s -Continue losartan   # CAD s/p PCI # HLD Lipid panel 4 months ago showed LDL of 54 -Continue rosuvastatin  -Discontinue ASA continue Eliquis  as above  # Vitamin D  deficiency Vitamin D  borderline low at 31.24 -Continue vitamin D  supplementation  # Anxiety and depression -Continue Lexapro    Advance Care Planning:   Code Status: Limited: Do not attempt resuscitation (DNR) -DNR-LIMITED -Do Not Intubate/DNI    Consults: None  Family Communication: Discussed admission with son at bedside  Severity of Illness: The appropriate patient status for this patient is INPATIENT. Inpatient status is judged to be reasonable and necessary in order to provide the required intensity of service to ensure the patient's safety. The patient's presenting symptoms, physical exam findings, and initial radiographic and laboratory data in the context of their chronic comorbidities is felt to place them at high risk for further clinical deterioration. Furthermore, it is not anticipated that the patient will be medically stable for discharge from the hospital within 2 midnights of admission.   * I certify that at the point of admission it is my clinical judgment that the patient will require inpatient hospital care spanning beyond 2 midnights from the point of admission due to high intensity of service, high risk for further deterioration and  high frequency of surveillance required.*  Author: Claretta CHRISTELLA Alderman, MD 05/30/2023 11:47 AM  For on call review [email or web address].

## 2023-05-30 NOTE — ED Notes (Signed)
 Thomas with cl called for transport

## 2023-05-30 NOTE — Plan of Care (Signed)

## 2023-05-31 ENCOUNTER — Other Ambulatory Visit (HOSPITAL_COMMUNITY): Payer: Self-pay

## 2023-05-31 ENCOUNTER — Telehealth (HOSPITAL_COMMUNITY): Payer: Self-pay | Admitting: Pharmacy Technician

## 2023-05-31 LAB — CBC
HCT: 32.1 % — ABNORMAL LOW (ref 36.0–46.0)
Hemoglobin: 10.9 g/dL — ABNORMAL LOW (ref 12.0–15.0)
MCH: 29.7 pg (ref 26.0–34.0)
MCHC: 34 g/dL (ref 30.0–36.0)
MCV: 87.5 fL (ref 80.0–100.0)
Platelets: 242 10*3/uL (ref 150–400)
RBC: 3.67 MIL/uL — ABNORMAL LOW (ref 3.87–5.11)
RDW: 16 % — ABNORMAL HIGH (ref 11.5–15.5)
WBC: 5.6 10*3/uL (ref 4.0–10.5)
nRBC: 0 % (ref 0.0–0.2)

## 2023-05-31 LAB — BASIC METABOLIC PANEL
Anion gap: 7 (ref 5–15)
BUN: 11 mg/dL (ref 8–23)
CO2: 25 mmol/L (ref 22–32)
Calcium: 8.7 mg/dL — ABNORMAL LOW (ref 8.9–10.3)
Chloride: 100 mmol/L (ref 98–111)
Creatinine, Ser: 0.86 mg/dL (ref 0.44–1.00)
GFR, Estimated: >60 mL/min (ref >60)
Glucose, Bld: 88 mg/dL (ref 70–99)
Potassium: 3.5 mmol/L (ref 3.5–5.1)
Sodium: 132 mmol/L — ABNORMAL LOW (ref 135–145)

## 2023-05-31 MED ORDER — OXYCODONE HCL 5 MG PO TABS: 5.0000 mg | ORAL_TABLET | Freq: Four times a day (QID) | ORAL | 0 refills | Status: AC | PRN

## 2023-05-31 MED ORDER — NALOXONE HCL 0.4 MG/ML IJ SOLN: 0.4000 mg | INTRAMUSCULAR | 0 refills | Status: DC | PRN

## 2023-05-31 MED ORDER — POLYETHYLENE GLYCOL 3350 17 G PO PACK: 17.0000 g | PACK | Freq: Every day | ORAL | 0 refills | Status: DC | PRN

## 2023-05-31 MED ORDER — APIXABAN 5 MG PO TABS: 5.0000 mg | ORAL_TABLET | Freq: Two times a day (BID) | ORAL | 0 refills | Status: DC

## 2023-05-31 NOTE — Evaluation (Signed)
 Physical Therapy Evaluation  Patient Details Name: Sarah Hendricks MRN: [id number] DOB: 1946/04/30 Today's Date: 05/31/2023  History of Present Illness  78 y.o. female  got weak and dizzy and fell, xrays show R distal fibula fx, TDWB. medical history significant of osteoarthritis, CAD/MI s/p PCI, HTN, HLD breast cancer s/p lumpectomy and radiation, GERD and vitamin D  deficiency   Clinical Impression  Pt admitted with above diagnosis. Pt currently with functional limitations due to the deficits listed below (see PT Problem List). At the time of PT eval pt was able to perform transfers and ~5 feet ambulation with min assist and RW for support. Pt endorses significant fatigue after ~5 feet transfer from [facility] to recliner chair. Feel a wheelchair would be beneficial for return home and getting out for follow up MD visits. Pt initially agreeable, and we discussed bumping up the stairs with the wheelchair. After session pt told RN she does not want the wheelchair. Continue to recommend as I believe it is the safest option as pt consistently required min assist throughout session with mobility. Acutely, pt will benefit from acute skilled PT to increase their independence and safety with mobility to allow discharge.           If plan is discharge home, recommend the following: A little help with walking and/or transfers;A little help with bathing/dressing/bathroom;Assistance with cooking/housework;Assist for transportation;Help with stairs or ramp for entrance   Can travel by private vehicle        Equipment Recommendations Rolling walker (2 wheels);Wheelchair (measurements PT);Wheelchair cushion (measurements PT)  Recommendations for Other Services       Functional Status Assessment Patient has had a recent decline in their functional status and demonstrates the ability to make significant improvements in function in a reasonable and predictable amount of time.     Precautions / Restrictions  Precautions Precautions: Fall Restrictions Weight Bearing Restrictions Per Provider Order: Yes RLE Weight Bearing Per Provider Order: Touchdown weight bearing      Mobility  Bed Mobility Overal bed mobility: Needs Assistance Bed Mobility: Supine to Sit     Supine to sit: Min assist     General bed mobility comments: Light assist to advance RLE towards EOB. Pt able to transition trunk to full sitting position without assist.    Transfers Overall transfer level: Needs assistance Equipment used: Rolling walker (2 wheels) Transfers: Sit to/from Stand, Bed to chair/wheelchair/BSC Sit to Stand: Min assist           General transfer comment: Assist for full stand and to gain/maintain standing balance. Pt with x3 attempts before able to stay upright.    Ambulation/Gait Ambulation/Gait assistance: Min assist Gait Distance (Feet): 5 Feet Assistive device: Rolling walker (2 wheels) Gait Pattern/deviations: Step-to pattern, Decreased stride length, Trunk flexed Gait velocity: Decreased Gait velocity interpretation: <1.31 ft/sec, indicative of household ambulator   General Gait Details: VC's for sequencing and general safety with the RW for support. Assist for balance and walker management.  Stairs            Wheelchair Mobility     Tilt Bed    Modified Rankin (Stroke Patients Only)       Balance Overall balance assessment: Mild deficits observed, not formally tested                                           Pertinent Vitals/Pain Pain  Assessment Pain Assessment: 0-10 Pain Score: 1  Pain Location: R ankle Pain Descriptors / Indicators: Operative site guarding Pain Intervention(s): Limited activity within patient's tolerance, Monitored during session, Repositioned    Home Living Family/patient expects to be discharged to:: Private residence Living Arrangements: Spouse/significant other Available Help at Discharge: Family;Available 24  hours/day Type of Home: House Home Access: Stairs to enter Entrance Stairs-Rails: None Entrance Stairs-Number of Steps: 2   Home Layout: Able to live on main level with bedroom/bathroom Home Equipment: Administrator (4 wheels);Grab bars - tub/shower      Prior Function Prior Level of Function : Independent/Modified Independent;Driving                     Extremity/Trunk Assessment   Upper Extremity Assessment Upper Extremity Assessment: Defer to OT evaluation    Lower Extremity Assessment Lower Extremity Assessment: RLE deficits/detail RLE Deficits / Details: Pt reports N/T throughout lower leg       Communication   Communication Communication: No apparent difficulties  Cognition Arousal: Alert Behavior During Therapy: WFL for tasks assessed/performed Overall Cognitive Status: Within Functional Limits for tasks assessed                                          General Comments      Exercises General Exercises - Lower Extremity Long Arc Quad: 10 reps Hip ABduction/ADduction: 5 reps   Assessment/Plan    PT Assessment Patient needs continued PT services  PT Problem List Decreased strength;Decreased balance;Decreased mobility;Decreased range of motion;Decreased activity tolerance;Decreased knowledge of use of DME;Decreased safety awareness;Decreased knowledge of precautions;Impaired sensation;Pain       PT Treatment Interventions DME instruction;Gait training;Stair training;Functional mobility training;Therapeutic activities;Therapeutic exercise;Balance training;Patient/family education;Wheelchair mobility training    PT Goals (Current goals can be found in the Care Plan section)  Acute Rehab PT Goals Patient Stated Goal: Return home this afternoon PT Goal Formulation: With patient Time For Goal Achievement: 06/07/23 Potential to Achieve Goals: Good    Frequency Min 1X/week     Co-evaluation                AM-PAC PT 6 Clicks Mobility  Outcome Measure Help needed turning from your back to your side while in a flat bed without using bedrails?: A Little Help needed moving from lying on your back to sitting on the side of a flat bed without using bedrails?: A Little Help needed moving to and from a bed to a chair (including a wheelchair)?: A Little Help needed standing up from a chair using your arms (e.g., wheelchair or bedside chair)?: A Little Help needed to walk in hospital room?: Total Help needed climbing 3-5 steps with a railing? : Total 6 Click Score: 14    End of Session Equipment Utilized During Treatment: Gait belt Activity Tolerance: Patient tolerated treatment well Patient left: in chair;with call bell/phone within reach;with chair alarm set Nurse Communication: Mobility status;Patient requests pain meds PT Visit Diagnosis: Unsteadiness on feet (R26.81);Pain Pain - Right/Left: Right Pain - part of body: Ankle and joints of foot    Time: 8583-8542 PT Time Calculation (min) (ACUTE ONLY): 41 min   Charges:   PT Evaluation $PT Eval Moderate Complexity: 1 Mod PT Treatments $Gait Training: 23-37 mins PT General Charges $$ ACUTE PT VISIT: 1 Visit         Leita Sable, PT, DPT Acute  Rehabilitation Services Secure Chat Preferred Office: [phone]   Leita JONETTA Sable 05/31/2023, 4:05 PM

## 2023-05-31 NOTE — TOC Transition Note (Signed)
 Transition of Care [facility]) - Discharge Note   Patient Details  Name: Sarah Hendricks MRN: [id number] Date of Birth: 12-13-1945  [facility] of [facility]) CM/SW Contact:  Rosalva Jon Bloch, RN Phone Number: 05/31/2023, 3:58 PM   Clinical Narrative:    Patient will DC to: home Anticipated DC date: 05/31/2023 Family notified: yes Transport by: car   Per MD patient ready for DC today . RN, patient, and patient's husband notified of DC. Order noted for home health and DME need. Pt agreeable to home health services. Pt without provider preference. Referral made with [facility] and accepted. Pt declined DME: W/C. Husband to provide transportation to home. Pt without RX med concerns. Post hospital f/u noted on AVS.  RNCM will sign off for now as intervention is no longer needed. Please consult us  again if new needs arise.   Final next level of care: Home w Home Health Services Barriers to Discharge: No Barriers Identified   Patient Goals and CMS Choice     Choice offered to / list presented to : Patient      Discharge Placement                       Discharge Plan and Services Additional resources added to the After Visit Summary for     Discharge Planning Services: CM Consult                      HH Arranged: PT [facility] Agency: [facility] Date [facility] Agency Contacted: 05/31/23 Time HH Agency Contacted: [phone] Representative spoke with at [facility] Agency: [place]  Social Drivers of Health (SDOH) Interventions SDOH Screenings   Food Insecurity: No Food Insecurity (05/30/2023)  Housing: Low Risk  (05/30/2023)  Transportation Needs: No Transportation Needs (05/30/2023)  Utilities: Not At Risk (05/30/2023)  Alcohol Screen: Low Risk  (09/23/2022)  Depression (PHQ2-9): Low Risk  (01/28/2023)  Financial Resource Strain: Low Risk  (09/23/2022)  Physical Activity: Sufficiently Active (09/23/2022)  Social Connections: Socially Integrated (05/30/2023)  Stress: No Stress Concern  Present (09/23/2022)  Tobacco Use: Low Risk  (05/29/2023)     Readmission Risk Interventions     No data to display

## 2023-05-31 NOTE — Evaluation (Signed)
 Occupational Therapy Evaluation Patient Details Name: Sarah Hendricks MRN: [id number] DOB: 1946/03/29 Today's Date: 05/31/2023   History of Present Illness 78 y.o. female  got weak and dizzy and fell, xrays show R distal fibula fx, TDWB. medical history significant of osteoarthritis, CAD/MI s/p PCI, HTN, HLD breast cancer s/p lumpectomy and radiation, GERD and vitamin D  deficiency   Clinical Impression   Pt c/o minimal pain to R ankle. Pt eager to return home, son in law present during session. Pt lives at home with and cares for husband, son in law and daughter will be able to assist as needed, daughter taking a week off work. Pt currently requires CGA for mobility with rollator, instructed on safety awareness and keeping brakes locked for extra support due to TDWB through R foot. Pt mod A for LB ADLs, will have support at home. Set up for UB ADLs. Pt was able to complete taking steps with HHAx2, instructed son in law on safety with assisting. Pt feels confident they can return home safely with rollator only. Pt Dcing home today, no further acute needs or follow up needs.        If plan is discharge home, recommend the following: A little help with walking and/or transfers;A little help with bathing/dressing/bathroom;Assistance with cooking/housework;Assist for transportation;Help with stairs or ramp for entrance    Functional Status Assessment  Patient has had a recent decline in their functional status and demonstrates the ability to make significant improvements in function in a reasonable and predictable amount of time.  Equipment Recommendations  None recommended by OT    Recommendations for Other Services       Precautions / Restrictions Precautions Precautions: Fall Restrictions Weight Bearing Restrictions Per Provider Order: Yes RLE Weight Bearing Per Provider Order: Touchdown weight bearing      Mobility Bed Mobility               General bed mobility comments:  arrived in recliner    Transfers Overall transfer level: Needs assistance Equipment used: Rollator (4 wheels) Transfers: Sit to/from Stand, Bed to chair/wheelchair/BSC Sit to Stand: Contact guard assist           General transfer comment: CGA for safety using RW      Balance Overall balance assessment: Mild deficits observed, not formally tested                                         ADL either performed or assessed with clinical judgement   ADL Overall ADL's : Needs assistance/impaired Eating/Feeding: Independent   Grooming: Set up   Upper Body Bathing: Set up   Lower Body Bathing: Moderate assistance;Sitting/lateral leans   Upper Body Dressing : Set up   Lower Body Dressing: Moderate assistance;Sit to/from stand   Toilet Transfer: Contact guard assist   Toileting- Clothing Manipulation and Hygiene: Modified independent       Functional mobility during ADLs: Contact guard assist General ADL Comments: Pt CGA for mobility with rollator, instructed to use breaks for extra stability if needed. Pt set up for UB ADLs, mod A for LB ADLs     Vision Baseline Vision/History: 1 Wears glasses Ability to See in Adequate Light: 0 Adequate Patient Visual Report: No change from baseline       Perception         Praxis         Pertinent Vitals/Pain  Pain Assessment Pain Assessment: 0-10 Pain Score: 1  Pain Location: R ankle Pain Descriptors / Indicators: Operative site guarding Pain Intervention(s): Monitored during session     Extremity/Trunk Assessment Upper Extremity Assessment Upper Extremity Assessment: Overall WFL for tasks assessed           Communication Communication Communication: No apparent difficulties   Cognition Arousal: Alert Behavior During Therapy: WFL for tasks assessed/performed Overall Cognitive Status: Within Functional Limits for tasks assessed                                       General  Comments       Exercises     Shoulder Instructions      Home Living Family/patient expects to be discharged to:: Private residence Living Arrangements: Spouse/significant other Available Help at Discharge: Family;Available 24 hours/day Type of Home: House Home Access: Stairs to enter [employer] of Steps: 2 Entrance Stairs-Rails: None Home Layout: Able to live on main level with bedroom/bathroom     Bathroom Shower/Tub: Producer, Television/film/video: Standard     Home Equipment: Administrator (4 wheels);Grab bars - tub/shower          Prior Functioning/Environment Prior Level of Function : Independent/Modified Independent;Driving                        OT Problem List: Decreased strength;Decreased range of motion;Decreased activity tolerance;Impaired balance (sitting and/or standing);Pain      OT Treatment/Interventions:      OT Goals(Current goals can be found in the care plan section) Acute Rehab OT Goals Patient Stated Goal: to return home OT Goal Formulation: With patient/family Time For Goal Achievement: 06/14/23 Potential to Achieve Goals: Good  OT Frequency:      Co-evaluation              AM-PAC OT 6 Clicks Daily Activity     Outcome Measure Help from another person eating meals?: None Help from another person taking care of personal grooming?: A Little Help from another person toileting, which includes using toliet, bedpan, or urinal?: A Little Help from another person bathing (including washing, rinsing, drying)?: A Little Help from another person to put on and taking off regular upper body clothing?: A Little Help from another person to put on and taking off regular lower body clothing?: A Lot 6 Click Score: 18   End of Session Equipment Utilized During Treatment: Gait belt;Rollator (4 wheels) Nurse Communication: Mobility status  Activity Tolerance: Patient tolerated treatment well Patient left: in  chair;with call bell/phone within reach;with family/visitor present  OT Visit Diagnosis: Unsteadiness on feet (R26.81);Other abnormalities of gait and mobility (R26.89);Muscle weakness (generalized) (M62.81);Pain Pain - Right/Left: Right Pain - part of body: Ankle and joints of foot                Time: 8497-8461 OT Time Calculation (min): 36 min Charges:  OT General Charges $OT Visit: 1 Visit OT Evaluation $OT Eval Low Complexity: 1 Low OT Treatments $Self Care/Home Management : 8-22 mins  [place], OTR/L   Elouise JONELLE Bott 05/31/2023, 3:46 PM

## 2023-05-31 NOTE — Progress Notes (Signed)
 Orthopedic Tech Progress Note Patient Details:  Sarah Hendricks Nov 03, 1945 [id number]  Ortho Devices Type of Ortho Device: CAM walker Ortho Device/Splint Location: RLE Ortho Device/Splint Interventions: Ordered, Application, Adjustment   Post Interventions Patient Tolerated: Well Instructions Provided: Care of device  [name] Pac 05/31/2023, 2:51 PM

## 2023-05-31 NOTE — Consult Note (Signed)
 Reason for Consult:Right ankle fx Referring Physician: Ava Swayze Time called: 1030 Time at bedside: 1116   Sarah Hendricks is an 78 y.o. female.  HPI: Sarah Hendricks has been feeling ill for the last several days. She got weak and dizzy and fell yesterday. She had immediate right ankle pain and could not bear weight. She was brought to the ED where x-rays showed a distal fibula fx. She was also in new onset afib and admitted. Orthopedic surgery was consulted the following day. She lives at home and rarely uses a cane to ambulate.  Past Medical History:  Diagnosis Date   Allergy    Arthritis    Breast cancer (HCC) 1994   lumpectomy and XRT, no chemo   CAD (coronary artery disease) 2008   PTCA, in [place]   GERD (gastroesophageal reflux disease)    History of chicken pox    Hyperlipidemia    Hypertension    Myocardial infarction (HCC) 03/15/2007   stent placed   Vitamin D  deficiency     Past Surgical History:  Procedure Laterality Date   BREAST BIOPSY Left 1994   BREAST LUMPECTOMY Left 1994   CATARACT EXTRACTION, BILATERAL     CORONARY ANGIOPLASTY WITH STENT PLACEMENT  2008   CRANIECTOMY FOR DEPRESSED SKULL FRACTURE  1986   TONSILLECTOMY  1966    Family History  Problem Relation Age of Onset   Breast cancer Mother    Hypertension Mother    Heart disease Mother    Stroke Mother    Arthritis Father    Heart disease Father        died at 38 of heart attack   Heart disease Brother    Stroke Maternal Grandmother    Heart disease Maternal Grandfather    Colon cancer Neg Hx    Esophageal cancer Neg Hx    Rectal cancer Neg Hx    Stomach cancer Neg Hx     Social History:  reports that she has never smoked. She has never used smokeless tobacco. She reports that she does not drink alcohol and does not use drugs.  Allergies:  Allergies  Allergen Reactions   Acetaminophen -Codeine Other (See Comments)    Bad dreams   Tigan [Trimethobenzamide] Other (See Comments)    Bad dreams    Alendronate     Caused leg pains    Atorvastatin  Other (See Comments)    Pt reports causes bilateral leg pains.   Codeine     Bad dreams   Simvastatin Other (See Comments)    Leg cramps    Medications: I have reviewed the patient's current medications.  Results for orders placed or performed during the hospital encounter of 05/29/23 (from the past 48 hours)  Basic metabolic panel     Status: Abnormal   Collection Time: 05/29/23  5:34 PM  Result Value Ref Range   Sodium 124 (L) 135 - 145 mmol/L   Potassium 4.3 3.5 - 5.1 mmol/L   Chloride 86 (L) 98 - 111 mmol/L   CO2 29 22 - 32 mmol/L   Glucose, Bld 126 (H) 70 - 99 mg/dL    Comment: Glucose reference range applies only to samples taken after fasting for at least 8 hours.   BUN 20 8 - 23 mg/dL   Creatinine, Ser 8.95 (H) 0.44 - 1.00 mg/dL   Calcium  9.5 8.9 - 10.3 mg/dL   GFR, Estimated 56 (L) >60 mL/min    Comment: (NOTE) Calculated using the CKD-EPI Creatinine Equation (2021)  Anion gap 9 5 - 15    Comment: Performed at [facility], [address] [place], [place] [address]  CBC with Differential     Status: Abnormal   Collection Time: 05/29/23  5:34 PM  Result Value Ref Range   WBC 10.7 (H) 4.0 - 10.5 K/uL   RBC 4.83 3.87 - 5.11 MIL/uL   Hemoglobin 14.1 12.0 - 15.0 g/dL   HCT 58.7 63.9 - 53.9 %   MCV 85.3 80.0 - 100.0 fL   MCH 29.2 26.0 - 34.0 pg   MCHC 34.2 30.0 - 36.0 g/dL   RDW 83.9 (H) 88.4 - 84.4 %   Platelets 351 150 - 400 K/uL   nRBC 0.0 0.0 - 0.2 %   Neutrophils Relative % 76 %   Neutro Abs 8.2 (H) 1.7 - 7.7 K/uL   Lymphocytes Relative 12 %   Lymphs Abs 1.3 0.7 - 4.0 K/uL   Monocytes Relative 11 %   Monocytes Absolute 1.1 (H) 0.1 - 1.0 K/uL   Eosinophils Relative 1 %   Eosinophils Absolute 0.1 0.0 - 0.5 K/uL   Basophils Relative 0 %   Basophils Absolute 0.0 0.0 - 0.1 K/uL   Immature Granulocytes 0 %   Abs Immature Granulocytes 0.03 0.00 - 0.07 K/uL    Comment: Performed at [facility], [address], [place], [place] [address]  Resp panel by RT-PCR (RSV, Flu A&B, Covid) Anterior Nasal Swab     Status: None   Collection Time: 05/29/23  5:34 PM   Specimen: Anterior Nasal Swab  Result Value Ref Range   SARS Coronavirus 2 by RT PCR NEGATIVE NEGATIVE    Comment: (NOTE) SARS-CoV-2 target nucleic acids are NOT DETECTED.  The SARS-CoV-2 RNA is generally detectable in upper respiratory specimens during the acute phase of infection. The lowest concentration of SARS-CoV-2 viral copies this assay can detect is 138 copies/mL. A negative result does not preclude SARS-Cov-2 infection and should not be used as the sole basis for treatment or other patient management decisions. A negative result may occur with  improper specimen collection/handling, submission of specimen other than nasopharyngeal swab, presence of viral mutation(s) within the areas targeted by this assay, and inadequate number of viral copies(<138 copies/mL). A negative result must be combined with clinical observations, patient history, and epidemiological information. The expected result is Negative.  Fact Sheet for Patients:  bloggercourse.com  Fact Sheet for Healthcare Providers:  seriousbroker.it  This test is no t yet approved or cleared by the [place]  FDA and  has been authorized for detection and/or diagnosis of SARS-CoV-2 by FDA under an Emergency Use Authorization (EUA). This EUA will remain  in effect (meaning this test can be used) for the duration of the COVID-19 declaration under Section 564(b)(1) of the Act, 21 U.S.C.section 360bbb-3(b)(1), unless the authorization is terminated  or revoked sooner.       Influenza A by PCR NEGATIVE NEGATIVE   Influenza B by PCR NEGATIVE NEGATIVE    Comment: (NOTE) The Xpert Xpress SARS-CoV-2/FLU/RSV plus assay is intended as an aid in the diagnosis of influenza from  Nasopharyngeal swab specimens and should not be used as a sole basis for treatment. Nasal washings and aspirates are unacceptable for Xpert Xpress SARS-CoV-2/FLU/RSV testing.  Fact Sheet for Patients: bloggercourse.com  Fact Sheet for Healthcare Providers: seriousbroker.it  This test is not yet approved or cleared by the [place]  FDA and has been authorized for detection and/or diagnosis of SARS-CoV-2 by FDA  under an Emergency Use Authorization (EUA). This EUA will remain in effect (meaning this test can be used) for the duration of the COVID-19 declaration under Section 564(b)(1) of the Act, 21 U.S.C. section 360bbb-3(b)(1), unless the authorization is terminated or revoked.     Resp Syncytial Virus by PCR NEGATIVE NEGATIVE    Comment: (NOTE) Fact Sheet for Patients: bloggercourse.com  Fact Sheet for Healthcare Providers: seriousbroker.it  This test is not yet approved or cleared by the [place]  FDA and has been authorized for detection and/or diagnosis of SARS-CoV-2 by FDA under an Emergency Use Authorization (EUA). This EUA will remain in effect (meaning this test can be used) for the duration of the COVID-19 declaration under Section 564(b)(1) of the Act, 21 U.S.C. section 360bbb-3(b)(1), unless the authorization is terminated or revoked.  Performed at [facility], [address] [place], [place] [address]   Magnesium     Status: None   Collection Time: 05/29/23  5:34 PM  Result Value Ref Range   Magnesium 2.0 1.7 - 2.4 mg/dL    Comment: Performed at [facility], [address] [place], [place] [address]  Osmolality     Status: None   Collection Time: 05/29/23  7:00 PM  Result Value Ref Range   Osmolality 280 275 - 295 mOsm/kg    Comment: Performed at [facility], [address] [place], [place] [address]   Na and K (sodium & potassium), rand urine     Status: None   Collection Time: 05/29/23  7:40 PM  Result Value Ref Range   Sodium, Ur 32 mmol/L   Potassium Urine 28 mmol/L    Comment: Performed at [facility], [address], [place], [place] [address]  Creatinine, urine, random     Status: None   Collection Time: 05/29/23  7:40 PM  Result Value Ref Range   Creatinine, Urine 72 mg/dL    Comment: Performed at [facility], [address], [place], [place] [address]  Urinalysis, Routine w reflex microscopic -Urine, Clean Catch     Status: Abnormal   Collection Time: 05/29/23  7:40 PM  Result Value Ref Range   Color, Urine YELLOW YELLOW   APPearance CLEAR CLEAR   Specific Gravity, Urine 1.009 1.005 - 1.030   pH 7.0 5.0 - 8.0   Glucose, UA NEGATIVE NEGATIVE mg/dL   Hgb urine dipstick NEGATIVE NEGATIVE   Bilirubin Urine NEGATIVE NEGATIVE   Ketones, ur TRACE (A) NEGATIVE mg/dL   Protein, ur NEGATIVE NEGATIVE mg/dL   Nitrite NEGATIVE NEGATIVE   Leukocytes,Ua NEGATIVE NEGATIVE    Comment: Performed at [facility], [address], [place], [place] [address]  Comprehensive metabolic panel     Status: Abnormal   Collection Time: 05/30/23  1:18 PM  Result Value Ref Range   Sodium 128 (L) 135 - 145 mmol/L   Potassium 3.2 (L) 3.5 - 5.1 mmol/L   Chloride 91 (L) 98 - 111 mmol/L   CO2 27 22 - 32 mmol/L   Glucose, Bld 113 (H) 70 - 99 mg/dL    Comment: Glucose reference range applies only to samples taken after fasting for at least 8 hours.   BUN 13 8 - 23 mg/dL   Creatinine, Ser 9.11 0.44 - 1.00 mg/dL   Calcium  9.0 8.9 - 10.3 mg/dL   Total Protein 5.6 (L) 6.5 - 8.1 g/dL   Albumin 3.0 (L) 3.5 - 5.0 g/dL   AST 18 15 -  41 U/L   ALT 13 0 - 44 U/L   Alkaline Phosphatase 59 38 - 126 U/L   Total Bilirubin 1.1 0.0 - 1.2 mg/dL   GFR, Estimated >39 >39 mL/min    Comment: (NOTE) Calculated using the CKD-EPI Creatinine Equation (2021)    Anion gap 10 5 - 15     Comment: Performed at [facility], [address], [place], [place] [address]  Phosphorus     Status: None   Collection Time: 05/30/23  1:18 PM  Result Value Ref Range   Phosphorus 3.1 2.5 - 4.6 mg/dL    Comment: Performed at [facility], [address], [place], [place] [address]  TSH     Status: None   Collection Time: 05/30/23  1:18 PM  Result Value Ref Range   TSH 1.262 0.350 - 4.500 uIU/mL    Comment: Performed by a 3rd Generation assay with a functional sensitivity of <=0.01 uIU/mL. Performed at [facility], [address], [place], [place] [address]   Protime-INR     Status: Abnormal   Collection Time: 05/30/23  1:18 PM  Result Value Ref Range   Prothrombin Time 16.0 (H) 11.4 - 15.2 seconds   INR 1.3 (H) 0.8 - 1.2    Comment: (NOTE) INR goal varies based on device and disease states. Performed at [facility], [address], [place], [place] [address]   VITAMIN D  25 Hydroxy (Vit-D Deficiency, Fractures)     Status: None   Collection Time: 05/30/23  1:18 PM  Result Value Ref Range   Vit D, 25-Hydroxy 31.24 30 - 100 ng/mL    Comment: (NOTE) Vitamin D  deficiency has been defined by the Institute of Medicine  and an Endocrine Society practice guideline as a level of serum 25-OH  vitamin D  less than 20 ng/mL (1,2). The Endocrine Society went on to  further define vitamin D  insufficiency as a level between 21 and 29  ng/mL (2).  1. IOM (Institute of Medicine). 2010. Dietary reference intakes for  calcium  and D. [place]: [employer]. 2. Holick MF, Binkley [place], Bischoff-Ferrari HA, et al. Evaluation,  treatment, and prevention of vitamin D  deficiency: an Endocrine  Society clinical practice guideline, JCEM. 2011 Jul; 96(7): 1911-30.  Performed at [facility], [address], [place], [place] [address]   Vitamin B12     Status: None   Collection Time: 05/30/23  1:18 PM  Result Value Ref Range   Vitamin B-12 504 180 -  914 pg/mL    Comment: (NOTE) This assay is not validated for testing neonatal or myeloproliferative syndrome specimens for Vitamin B12 levels. Performed at [facility], [address], [place], [place] [address]   Basic metabolic panel     Status: Abnormal   Collection Time: 05/31/23  5:01 AM  Result Value Ref Range   Sodium 132 (L) 135 - 145 mmol/L   Potassium 3.5 3.5 - 5.1 mmol/L   Chloride 100 98 - 111 mmol/L   CO2 25 22 - 32 mmol/L   Glucose, Bld 88 70 - 99 mg/dL    Comment: Glucose reference range applies only to samples taken after fasting for at least 8 hours.   BUN 11 8 - 23 mg/dL   Creatinine, Ser 9.13 0.44 - 1.00 mg/dL   Calcium  8.7 (L) 8.9 - 10.3 mg/dL   GFR, Estimated >39 >39 mL/min    Comment: (NOTE) Calculated using  the CKD-EPI Creatinine Equation (2021)    Anion gap 7 5 - 15    Comment: Performed at [facility], [address] [place], [place] [address]  CBC     Status: Abnormal   Collection Time: 05/31/23  5:01 AM  Result Value Ref Range   WBC 5.6 4.0 - 10.5 K/uL   RBC 3.67 (L) 3.87 - 5.11 MIL/uL   Hemoglobin 10.9 (L) 12.0 - 15.0 g/dL   HCT 67.8 (L) 63.9 - 53.9 %   MCV 87.5 80.0 - 100.0 fL   MCH 29.7 26.0 - 34.0 pg   MCHC 34.0 30.0 - 36.0 g/dL   RDW 83.9 (H) 88.4 - 84.4 %   Platelets 242 150 - 400 K/uL   nRBC 0.0 0.0 - 0.2 %    Comment: Performed at [facility], [address] [place], [place] [address]    ECHOCARDIOGRAM COMPLETE Result Date: 05/30/2023    ECHOCARDIOGRAM REPORT   Patient Name:   ALBINA GOSNEY Date of Exam: 05/30/2023 Medical Rec #:  [id number]        Height:       63.0 in Accession #:    [id number]       Weight:       156.1 lb Date of Birth:  [birth year]        BSA:          1.740 m Patient Age:    77 years         BP:           115/69 mmHg Patient Gender: F                HR:           87 bpm. Exam Location:  Inpatient Procedure: 2D Echo, Cardiac Doppler, Color Doppler and Intracardiac            Opacification Agent  Indications:    Atrial Fibrillation  History:        Patient has prior history of Echocardiogram examinations, most                 recent 12/02/2022. CAD; Risk Factors:Hypertension.  Sonographer:    Lanell Maduro Referring Phys: [id number] PROSPER M AMPONSAH IMPRESSIONS  1. Unchanged apical akinesis. Left ventricular ejection fraction, by estimation, is 45 to 50%. The left ventricle has mildly decreased function. There is mild concentric left ventricular hypertrophy. Left ventricular diastolic parameters are indeterminate.  2. Right ventricular systolic function is normal. The right ventricular size is normal.  3. Left atrial size was moderately dilated.  4. The mitral valve is normal in structure. No evidence of mitral valve regurgitation.  5. The aortic valve is tricuspid. Aortic valve regurgitation is not visualized.  6. The inferior vena cava is normal in size with greater than 50% respiratory variability, suggesting right atrial pressure of 3 mmHg. FINDINGS  Left Ventricle: Unchanged apical akinesis. Left ventricular ejection fraction, by estimation, is 45 to 50%. The left ventricle has mildly decreased function. Definity  contrast agent was given IV to delineate the left ventricular endocardial borders. The  left ventricular internal cavity size was normal in size. There is mild concentric left ventricular hypertrophy. Left ventricular diastolic parameters are indeterminate. Right Ventricle: The right ventricular size is normal. Right ventricular systolic function is normal. Left Atrium: Left atrial size was moderately dilated. Right Atrium: Right atrial size was normal in size. Pericardium: There is no evidence of pericardial effusion. Mitral Valve: The mitral  valve is normal in structure. No evidence of mitral valve regurgitation. Tricuspid Valve: Tricuspid valve regurgitation is mild. Aortic Valve: The aortic valve is tricuspid. Aortic valve regurgitation is not visualized. Pulmonic Valve: Pulmonic valve  regurgitation is not visualized. Aorta: The aortic root and ascending aorta are structurally normal, with no evidence of dilitation. Venous: The inferior vena cava is normal in size with greater than 50% respiratory variability, suggesting right atrial pressure of 3 mmHg. IAS/Shunts: The interatrial septum was not well visualized.  LEFT VENTRICLE PLAX 2D LVIDd:         3.70 cm     Diastology LVIDs:         2.60 cm     LV e' medial:    6.64 cm/s LV PW:         1.20 cm     LV E/e' medial:  12.8 LV IVS:        1.10 cm     LV e' lateral:   7.40 cm/s LVOT diam:     1.80 cm     LV E/e' lateral: 11.5 LV SV:         25 LV SV Index:   15 LVOT Area:     2.54 cm  LV Volumes (MOD) LV vol d, MOD A2C: 54.6 ml LV vol d, MOD A4C: 88.5 ml LV vol s, MOD A2C: 33.9 ml LV vol s, MOD A4C: 43.8 ml LV SV MOD A2C:     20.7 ml LV SV MOD A4C:     88.5 ml LV SV MOD BP:      33.0 ml RIGHT VENTRICLE            IVC RV Basal diam:  1.90 cm    IVC diam: 1.60 cm RV S prime:     8.38 cm/s TAPSE (M-mode): 1.4 cm LEFT ATRIUM             Index        RIGHT ATRIUM          Index LA diam:        3.90 cm 2.24 cm/m   RA Area:     6.04 cm LA Vol (A2C):   32.3 ml 18.56 ml/m  RA Volume:   6.44 ml  3.70 ml/m LA Vol (A4C):   39.9 ml 22.93 ml/m LA Biplane Vol: 39.3 ml 22.58 ml/m  AORTIC VALVE LVOT Vmax:   57.90 cm/s LVOT Vmean:  35.760 cm/s LVOT VTI:    0.099 m  AORTA Ao Root diam: 2.50 cm Ao Asc diam:  3.00 cm MITRAL VALVE               TRICUSPID VALVE MV Area (PHT): 4.24 cm    TR Peak grad:   31.6 mmHg MV Decel Time: 179 msec    TR Vmax:        281.00 cm/s MV E velocity: 84.90 cm/s                            SHUNTS                            Systemic VTI:  0.10 m                            Systemic Diam: 1.80 cm Ronal Ross Electronically signed by Ronal Ross Signature Date/Time: 05/30/2023/3:29:34  PM    Final    DG Ankle Complete Right Result Date: 05/29/2023 CLINICAL DATA:  Fall, right ankle pain EXAM: RIGHT ANKLE - COMPLETE 3+ VIEW COMPARISON:   Right foot x-ray 10/20/2015 FINDINGS: Acute mildly comminuted fracture of the distal fibular metaphysis with extension to the ankle mortise. 3 mm of lateral displacement. Chronic appearing deformity at the tip of the medial malleolus without acute fracture. No posterior malleolar fracture. Remaining osseous structures appear otherwise intact. No dislocation. Plantar calcaneal spur. Soft tissue swelling most pronounced at the anterior and lateral aspects of the ankle. IMPRESSION: Acute mildly comminuted and displaced fracture of the lateral malleolus. Electronically Signed   By: Mabel Converse D.O.   On: 05/29/2023 17:13   DG Chest Portable 1 View Result Date: 05/29/2023 CLINICAL DATA:  Near syncope EXAM: PORTABLE CHEST 1 VIEW COMPARISON:  None Available. FINDINGS: Calcified mediastinal and hilar lymph nodes compatible with old granulomatous disease. Low lung volumes are present, causing crowding of the pulmonary vasculature. Heart size within normal limits. Coronary atherosclerosis and possible stenting. Clips project over the left lateral axilla/breast. The lungs appear clear.  Mild bony demineralization. IMPRESSION: 1. Low lung volumes, causing crowding of the pulmonary vasculature. 2. Coronary atherosclerosis and possible stenting. 3. Old granulomatous disease. Electronically Signed   By: Ryan Salvage M.D.   On: 05/29/2023 17:09    Review of Systems  HENT:  Negative for ear discharge, ear pain, hearing loss and tinnitus.   Eyes:  Negative for photophobia and pain.  Respiratory:  Negative for cough and shortness of breath.   Cardiovascular:  Negative for chest pain.  Gastrointestinal:  Negative for abdominal pain, nausea and vomiting.  Genitourinary:  Negative for dysuria, flank pain, frequency and urgency.  Musculoskeletal:  Positive for arthralgias (Right ankle). Negative for back pain, myalgias and neck pain.  Neurological:  Negative for dizziness and headaches.  Hematological:  Does not  bruise/bleed easily.  Psychiatric/Behavioral:  The patient is not nervous/anxious.    Blood pressure 126/79, pulse 84, temperature 98.2 F (36.8 C), temperature source Oral, resp. rate 18, height 5' 3 (1.6 m), weight 70.8 kg, SpO2 96%. Physical Exam Constitutional:      General: She is not in acute distress.    Appearance: She is well-developed. She is not diaphoretic.  HENT:     Head: Normocephalic and atraumatic.  Eyes:     General: No scleral icterus.       Right eye: No discharge.        Left eye: No discharge.     Conjunctiva/sclera: Conjunctivae normal.  Cardiovascular:     Rate and Rhythm: Normal rate and regular rhythm.  Pulmonary:     Effort: Pulmonary effort is normal. No respiratory distress.  Musculoskeletal:     Cervical back: Normal range of motion.     Comments: RLE No traumatic wounds, ecchymosis, or rash  Short leg splint in place  No knee effusion  Knee stable to varus/ valgus and anterior/posterior stress  Sens DPN, SPN, TN intact  Motor EHL 5/5  Toes pefused, No significant edema  Skin:    General: Skin is warm and dry.  Neurological:     Mental Status: She is alert.  Psychiatric:        Mood and Affect: Mood normal.        Behavior: Behavior normal.     Assessment/Plan: Right ankle fx -- Plan initial non-operative management in CAM boot and TDWB. F/u with Dr. Jerri in 2 weeks.  Ozell DOROTHA Ned, PA-C Orthopedic Surgery [phone] 05/31/2023, 11:50 AM

## 2023-05-31 NOTE — Plan of Care (Signed)
   Problem: Education: Goal: Knowledge of General Education information will improve Description Including pain rating scale, medication(s)/side effects and non-pharmacologic comfort measures Outcome: Progressing

## 2023-05-31 NOTE — Plan of Care (Signed)
   Problem: Activity: Goal: Risk for activity intolerance will decrease Outcome: Progressing   Problem: Nutrition: Goal: Adequate nutrition will be maintained Outcome: Progressing   Problem: Coping: Goal: Level of anxiety will decrease Outcome: Progressing

## 2023-05-31 NOTE — Discharge Instructions (Signed)

## 2023-05-31 NOTE — TOC CAGE-AID Note (Signed)
 Transition of Care [facility]) - CAGE-AID Screening   Patient Details  Name: Sarah Hendricks MRN: [id number] Date of Birth: 1946/04/02  Transition of Care [facility]) CM/SW Contact:    LEBRON ROCKIE ORN, RN Phone Number: [phone] 05/31/2023, 11:00 AM   Clinical Narrative: Pt in the hospital after twisting her ankle while in her kitchen after having a syncopal episode.  Pt has an ankle fracture. Pt denies alcohol or drug use.  Screening complete.    CAGE-AID Screening:    Have You Ever Felt You Ought to Cut Down on Your Drinking or Drug Use?: No Have People Annoyed You By Critizing Your Drinking Or Drug Use?: No Have You Felt Bad Or Guilty About Your Drinking Or Drug Use?: No Have You Ever Had a Drink or Used Drugs First Thing In The Morning to Steady Your Nerves or to Get Rid of a Hangover?: No CAGE-AID Score: 0  Substance Abuse Education Offered: No

## 2023-05-31 NOTE — Telephone Encounter (Signed)
 Patient Product/process development scientist completed.    The patient is insured through [facility]. Patient has Medicare and is not eligible for a copay card, but may be able to apply for patient assistance or Medicare RX Payment Plan (Patient Must reach out to their plan, if eligible for payment plan), if available.    Ran test claim for Eliquis 5 mg and the current 30 day co-pay is $420.00 due to a $375.00 deductible.  Will be $45.00 once deductible is met.   This test claim was processed through [facility]- copay amounts may vary at other pharmacies due to pharmacy/plan contracts, or as the patient moves through the different stages of their insurance plan.     Roland Earl, CPHT Pharmacy Technician III Certified Patient Advocate [facility] Pharm[facility] Patient Advocate Team Direct Number: [phone]  Fax: [phone]

## 2023-06-01 ENCOUNTER — Telehealth: Payer: Self-pay | Admitting: Internal Medicine

## 2023-06-01 NOTE — Telephone Encounter (Signed)
Copied from CRM [id number]. Topic: Clinical - Medication Question >> Jun 01, 2023  9:34 AM [name] wrote: Reason for CRM: Patient is calling because she was discharged from the hospital yesterday and was prescribed apixaban (ELIQUIS) 5 MG TABS tablet; however, it cost $400 for her and she wants to know if there is an alternative or anything we can do to help with lowering the price. A hospital follow up with Dr.Burns has been scheduled for 06/07/2023.

## 2023-06-02 NOTE — Discharge Summary (Signed)
 Physician Discharge Summary   Patient: Sarah Hendricks MRN: [id number] DOB: [birth year]  Admit date:     05/29/2023  Discharge date: 05/31/2023  Discharge Physician: Brigida Bureau   PCP: Geofm Glade PARAS, MD   Recommendations at discharge:    Discharge to home with CAM boot. Wear CAM boot when pt is up.  Toe touch weight bearing on right extremity. Follow up with atrial fibrillation clinic as directed. Follow up with PCP in 7-10 days  Discharge Diagnoses: Principal Problem:   Fall, initial encounter Active Problems:   New onset a-fib (HCC)   Closed fracture of right ankle  Resolved Problems:   * No resolved hospital problems. [facility] Course: The patient was admitted to [facility] after transfer here from outside ED. Orthopedic surgery was consulted. The patient was evaluated. It was determined that she could have the ankle evaluated and have surgery if necessary as outpatient. She was supplied with a CAM boot by orthopedic surgery. She will follow up with them as outpatient.  Assessment and Plan:  # Fall # Syncope # Right ankle fracture Elderly patient with recent history of gastroenteritis complicated by dehydration presented to the ER after feeling lightheaded and falling while in her kitchen.  Imaging of her right ankle shows acute mildly comminuted displaced fracture of the lateral malleolus. She is currently in a posterior splint with improvement in her right ankle pain.    Echocardiogram was performed due to concerns for syncope and new diagnosis of atrial fibrillation, although the patient did deny that assertion. It demonstrated an EF of 45-50%. No regional wall motion abnormalities.  She was seen by orthopedic surgery. She will follow up with them as outpatient. She was supplied with a CAM boot.   The patient was admitted to [facility] for PT/OT eval as well as  -PT/OT consulted, pending eval and recs -WBAT -Pain control with as needed Percocet  --Cam boot supplied -Fall  precautions -Outpatient follow-up with orthopedic surgery, patient prefers to see her own orthopedic surgeon. She will need surgery.   # New onset A-fib Patient presented after a fall and found to have new onset of A-fib. Heart rate stable in the 70s to 90s. Patient hemodynamically stable. She denies any palpitations, chest pain or dizziness at the moment. Anticoagulation initiated in the ED due to CHA2DS2-VASc 2 score of 6. Normal mag and Phos. Last TTE in July 2024 showed mildly reduced EF 45-50%, regional wall motion abnormalities, G2DD, moderately dilated LA. -Follow-up repeat TTE demonstrated unchanged EF and no substantive changes since last echo. -Continue Eliquis  5 mg daily -Continue propranolol  -Follow-up TSH -Follow-up with A-fib clinic in the outpatient   # Hyponatremia Patient with recent gastroenteritis complicated by dehydration to have sodium of 124 on admission. Status post 1 L IV NS in the ER. Sodium improved to 132 on the day of discharge. -Give additional IV NS 1 L bolus -Follow-up morning BMP   # Mild hypokalemia Resolved.   # HTN -BP stable with SBP in the 110s to 130s -Continue losartan    # CAD s/p PCI # HLD Lipid panel 4 months ago showed LDL of 54 -Continue rosuvastatin  -Discontinue ASA continue Eliquis  as above   # Vitamin D  deficiency Vitamin D  borderline low at 31.24 -Continue vitamin D  supplementation   # Anxiety and depression -Continue Lexapro        Consultants: Orthopedic surgery Procedures performed: None  Disposition: Home Diet recommendation:  Discharge Diet Orders (From admission, onward)     Start  Ordered   05/31/23 0000  Diet - low sodium heart healthy        05/31/23 1426           Cardiac diet DISCHARGE MEDICATION: Allergies as of 05/31/2023       Reactions   Acetaminophen -codeine Other (See Comments)   Bad dreams   Tigan [trimethobenzamide] Other (See Comments)   Bad dreams   Alendronate    Caused leg pains     Atorvastatin  Other (See Comments)   Pt reports causes bilateral leg pains.   Codeine    Bad dreams   Simvastatin Other (See Comments)   Leg cramps        Medication List     STOP taking these medications    aspirin 81 MG tablet   calcium  carbonate 600 MG Tabs tablet Commonly known as: OS-CAL   vitamin k 100 MCG tablet       TAKE these medications    apixaban  5 MG Tabs tablet Commonly known as: ELIQUIS  Take 1 tablet (5 mg total) by mouth 2 (two) times daily.   CALCIUM  MAGNESIUM ZINC PO Take 1 tablet by mouth every other day.   Cholecalciferol  25 MCG (1000 UT) tablet Commonly known as: EQL Vitamin D3 Take 1 tablet (1,000 Units total) by mouth daily.   escitalopram  10 MG tablet Commonly known as: LEXAPRO  TAKE 1 TABLET(10 MG) BY MOUTH DAILY What changed: See the new instructions.   Fish Oil 1200 MG Caps Take 1 capsule by mouth daily.   losartan  100 MG tablet Commonly known as: COZAAR  TAKE 1 TABLET(100 MG) BY MOUTH DAILY   meloxicam  15 MG tablet Commonly known as: MOBIC  Take 15 mg by mouth daily.   naloxone  0.4 MG/ML injection Commonly known as: NARCAN  Inject 1 mL (0.4 mg total) into the vein as needed.   oxyCODONE  5 MG immediate release tablet Commonly known as: Oxy IR/ROXICODONE  Take 1 tablet (5 mg total) by mouth every 6 (six) hours as needed for up to 10 days for moderate pain (pain score 4-6), breakthrough pain or severe pain (pain score 7-10).   polyethylene glycol 17 g packet Commonly known as: MIRALAX  / GLYCOLAX  Take 17 g by mouth daily as needed for mild constipation.   propranolol  80 MG tablet Commonly known as: INDERAL  Take 1 tablet (80 mg total) by mouth 2 (two) times daily.   rosuvastatin  10 MG tablet Commonly known as: CRESTOR  Take 1 tablet (10 mg total) by mouth daily.        Follow-up Information     Jerri Kay HERO, MD Follow up in 1 week(s).   Specialty: Orthopedic Surgery Why: recheck ankle fracture Contact  information: [address]  [place] [address] [phone]         [facility] Follow up.   Specialty: Home Health Services Why: home health services will be provided by [facility] , start of care within 48 hours post discharge Contact information: [address] [place] [address] [phone]         Geofm Glade PARAS, MD Follow up.   Specialty: Internal Medicine Contact information: [address] [place] [address] [phone]                Discharge Exam: Fredricka Weights   05/29/23 1628  Weight: 70.8 kg   Exam:  Constitutional:  The patient is awake, alert, and oriented x 3. No acute distress. Respiratory:  No increased work of breathing. No wheezes, rales,  or rhonchi No tactile fremitus Cardiovascular:  Regular rate and rhythm No murmurs, ectopy, or gallups. No lateral PMI. No thrills. Abdomen:  Abdomen is soft, non-tender, non-distended No hernias, masses, or organomegaly Normoactive bowel sounds.  Musculoskeletal:  No cyanosis, clubbing, or edema Skin:  No rashes, lesions, ulcers palpation of skin: no induration or nodules Neurologic:  CN 2-12 intact Sensation all 4 extremities intact Psychiatric:  Mental status Mood, affect appropriate Orientation to person, place, time  judgment and insight appear intact   Condition at discharge: fair  The results of significant diagnostics from this hospitalization (including imaging, microbiology, ancillary and laboratory) are listed below for reference.   Imaging Studies: ECHOCARDIOGRAM COMPLETE Result Date: 05/30/2023    ECHOCARDIOGRAM REPORT   Patient Name:   REMIE MATHISON Date of Exam: 05/30/2023 Medical Rec #:  [id number]        Height:       63.0 in Accession #:    [id number]       Weight:       156.1 lb Date of Birth:  1946-04-17        BSA:          1.740 m Patient Age:    77 years         BP:           115/69 mmHg Patient Gender: F                 HR:           87 bpm. Exam Location:  Inpatient Procedure: 2D Echo, Cardiac Doppler, Color Doppler and Intracardiac            Opacification Agent Indications:    Atrial Fibrillation  History:        Patient has prior history of Echocardiogram examinations, most                 recent 12/02/2022. CAD; Risk Factors:Hypertension.  Sonographer:    Lanell Maduro Referring Phys: [id number] PROSPER M AMPONSAH IMPRESSIONS  1. Unchanged apical akinesis. Left ventricular ejection fraction, by estimation, is 45 to 50%. The left ventricle has mildly decreased function. There is mild concentric left ventricular hypertrophy. Left ventricular diastolic parameters are indeterminate.  2. Right ventricular systolic function is normal. The right ventricular size is normal.  3. Left atrial size was moderately dilated.  4. The mitral valve is normal in structure. No evidence of mitral valve regurgitation.  5. The aortic valve is tricuspid. Aortic valve regurgitation is not visualized.  6. The inferior vena cava is normal in size with greater than 50% respiratory variability, suggesting right atrial pressure of 3 mmHg. FINDINGS  Left Ventricle: Unchanged apical akinesis. Left ventricular ejection fraction, by estimation, is 45 to 50%. The left ventricle has mildly decreased function. Definity  contrast agent was given IV to delineate the left ventricular endocardial borders. The  left ventricular internal cavity size was normal in size. There is mild concentric left ventricular hypertrophy. Left ventricular diastolic parameters are indeterminate. Right Ventricle: The right ventricular size is normal. Right ventricular systolic function is normal. Left Atrium: Left atrial size was moderately dilated. Right Atrium: Right atrial size was normal in size. Pericardium: There is no evidence of pericardial effusion. Mitral Valve: The mitral valve is normal in structure. No evidence of mitral valve regurgitation. Tricuspid Valve: Tricuspid  valve regurgitation is mild. Aortic Valve: The aortic valve is tricuspid. Aortic valve regurgitation is not visualized. Pulmonic Valve: Pulmonic valve regurgitation is not visualized. Aorta:  The aortic root and ascending aorta are structurally normal, with no evidence of dilitation. Venous: The inferior vena cava is normal in size with greater than 50% respiratory variability, suggesting right atrial pressure of 3 mmHg. IAS/Shunts: The interatrial septum was not well visualized.  LEFT VENTRICLE PLAX 2D LVIDd:         3.70 cm     Diastology LVIDs:         2.60 cm     LV e' medial:    6.64 cm/s LV PW:         1.20 cm     LV E/e' medial:  12.8 LV IVS:        1.10 cm     LV e' lateral:   7.40 cm/s LVOT diam:     1.80 cm     LV E/e' lateral: 11.5 LV SV:         25 LV SV Index:   15 LVOT Area:     2.54 cm  LV Volumes (MOD) LV vol d, MOD A2C: 54.6 ml LV vol d, MOD A4C: 88.5 ml LV vol s, MOD A2C: 33.9 ml LV vol s, MOD A4C: 43.8 ml LV SV MOD A2C:     20.7 ml LV SV MOD A4C:     88.5 ml LV SV MOD BP:      33.0 ml RIGHT VENTRICLE            IVC RV Basal diam:  1.90 cm    IVC diam: 1.60 cm RV S prime:     8.38 cm/s TAPSE (M-mode): 1.4 cm LEFT ATRIUM             Index        RIGHT ATRIUM          Index LA diam:        3.90 cm 2.24 cm/m   RA Area:     6.04 cm LA Vol (A2C):   32.3 ml 18.56 ml/m  RA Volume:   6.44 ml  3.70 ml/m LA Vol (A4C):   39.9 ml 22.93 ml/m LA Biplane Vol: 39.3 ml 22.58 ml/m  AORTIC VALVE LVOT Vmax:   57.90 cm/s LVOT Vmean:  35.760 cm/s LVOT VTI:    0.099 m  AORTA Ao Root diam: 2.50 cm Ao Asc diam:  3.00 cm MITRAL VALVE               TRICUSPID VALVE MV Area (PHT): 4.24 cm    TR Peak grad:   31.6 mmHg MV Decel Time: 179 msec    TR Vmax:        281.00 cm/s MV E velocity: 84.90 cm/s                            SHUNTS                            Systemic VTI:  0.10 m                            Systemic Diam: 1.80 cm Ronal Ross Electronically signed by Ronal Ross Signature Date/Time: 05/30/2023/3:29:34 PM     Final    DG Ankle Complete Right Result Date: 05/29/2023 CLINICAL DATA:  Fall, right ankle pain EXAM: RIGHT ANKLE - COMPLETE 3+ VIEW COMPARISON:  Right foot x-ray 10/20/2015 FINDINGS: Acute mildly comminuted fracture  of the distal fibular metaphysis with extension to the ankle mortise. 3 mm of lateral displacement. Chronic appearing deformity at the tip of the medial malleolus without acute fracture. No posterior malleolar fracture. Remaining osseous structures appear otherwise intact. No dislocation. Plantar calcaneal spur. Soft tissue swelling most pronounced at the anterior and lateral aspects of the ankle. IMPRESSION: Acute mildly comminuted and displaced fracture of the lateral malleolus. Electronically Signed   By: Mabel Converse D.O.   On: 05/29/2023 17:13   DG Chest Portable 1 View Result Date: 05/29/2023 CLINICAL DATA:  Near syncope EXAM: PORTABLE CHEST 1 VIEW COMPARISON:  None Available. FINDINGS: Calcified mediastinal and hilar lymph nodes compatible with old granulomatous disease. Low lung volumes are present, causing crowding of the pulmonary vasculature. Heart size within normal limits. Coronary atherosclerosis and possible stenting. Clips project over the left lateral axilla/breast. The lungs appear clear.  Mild bony demineralization. IMPRESSION: 1. Low lung volumes, causing crowding of the pulmonary vasculature. 2. Coronary atherosclerosis and possible stenting. 3. Old granulomatous disease. Electronically Signed   By: Ryan Salvage M.D.   On: 05/29/2023 17:09    Microbiology: Results for orders placed or performed during the hospital encounter of 05/29/23  Resp panel by RT-PCR (RSV, Flu A&B, Covid) Anterior Nasal Swab     Status: None   Collection Time: 05/29/23  5:34 PM   Specimen: Anterior Nasal Swab  Result Value Ref Range Status   SARS Coronavirus 2 by RT PCR NEGATIVE NEGATIVE Final    Comment: (NOTE) SARS-CoV-2 target nucleic acids are NOT DETECTED.  The SARS-CoV-2 RNA  is generally detectable in upper respiratory specimens during the acute phase of infection. The lowest concentration of SARS-CoV-2 viral copies this assay can detect is 138 copies/mL. A negative result does not preclude SARS-Cov-2 infection and should not be used as the sole basis for treatment or other patient management decisions. A negative result may occur with  improper specimen collection/handling, submission of specimen other than nasopharyngeal swab, presence of viral mutation(s) within the areas targeted by this assay, and inadequate number of viral copies(<138 copies/mL). A negative result must be combined with clinical observations, patient history, and epidemiological information. The expected result is Negative.  Fact Sheet for Patients:  bloggercourse.com  Fact Sheet for Healthcare Providers:  seriousbroker.it  This test is no t yet approved or cleared by the [place]  FDA and  has been authorized for detection and/or diagnosis of SARS-CoV-2 by FDA under an Emergency Use Authorization (EUA). This EUA will remain  in effect (meaning this test can be used) for the duration of the COVID-19 declaration under Section 564(b)(1) of the Act, 21 U.S.C.section 360bbb-3(b)(1), unless the authorization is terminated  or revoked sooner.       Influenza A by PCR NEGATIVE NEGATIVE Final   Influenza B by PCR NEGATIVE NEGATIVE Final    Comment: (NOTE) The Xpert Xpress SARS-CoV-2/FLU/RSV plus assay is intended as an aid in the diagnosis of influenza from Nasopharyngeal swab specimens and should not be used as a sole basis for treatment. Nasal washings and aspirates are unacceptable for Xpert Xpress SARS-CoV-2/FLU/RSV testing.  Fact Sheet for Patients: bloggercourse.com  Fact Sheet for Healthcare Providers: seriousbroker.it  This test is not yet approved or cleared by the  [place]  FDA and has been authorized for detection and/or diagnosis of SARS-CoV-2 by FDA under an Emergency Use Authorization (EUA). This EUA will remain in effect (meaning this test can be used) for the duration of the COVID-19 declaration under Section  564(b)(1) of the Act, 21 U.S.C. section 360bbb-3(b)(1), unless the authorization is terminated or revoked.     Resp Syncytial Virus by PCR NEGATIVE NEGATIVE Final    Comment: (NOTE) Fact Sheet for Patients: bloggercourse.com  Fact Sheet for Healthcare Providers: seriousbroker.it  This test is not yet approved or cleared by the [place]  FDA and has been authorized for detection and/or diagnosis of SARS-CoV-2 by FDA under an Emergency Use Authorization (EUA). This EUA will remain in effect (meaning this test can be used) for the duration of the COVID-19 declaration under Section 564(b)(1) of the Act, 21 U.S.C. section 360bbb-3(b)(1), unless the authorization is terminated or revoked.  Performed at [facility], [address] [place], [place] [address]     Labs: CBC: Recent Labs  Lab 05/29/23 1734 05/31/23 0501  WBC 10.7* 5.6  NEUTROABS 8.2*  --   HGB 14.1 10.9*  HCT 41.2 32.1*  MCV 85.3 87.5  PLT 351 242   Basic Metabolic Panel: Recent Labs  Lab 05/29/23 1734 05/30/23 1318 05/31/23 0501  NA 124* 128* 132*  K 4.3 3.2* 3.5  CL 86* 91* 100  CO2 29 27 25   GLUCOSE 126* 113* 88  BUN 20 13 11   CREATININE 1.04* 0.88 0.86  CALCIUM  9.5 9.0 8.7*  MG 2.0  --   --   PHOS  --  3.1  --    Liver Function Tests: Recent Labs  Lab 05/30/23 1318  AST 18  ALT 13  ALKPHOS 59  BILITOT 1.1  PROT 5.6*  ALBUMIN 3.0*   CBG: No results for input(s): GLUCAP in the last 168 hours.  Discharge time spent: greater than 30 minutes.  Signed: Tejah Brekke, DO [facility] 06/02/2023

## 2023-06-03 ENCOUNTER — Telehealth: Payer: Self-pay | Admitting: Internal Medicine

## 2023-06-03 DIAGNOSIS — I480 Paroxysmal atrial fibrillation: Secondary | ICD-10-CM | POA: Diagnosis not present

## 2023-06-03 DIAGNOSIS — Z791 Long term (current) use of non-steroidal anti-inflammatories (NSAID): Secondary | ICD-10-CM | POA: Diagnosis not present

## 2023-06-03 DIAGNOSIS — F419 Anxiety disorder, unspecified: Secondary | ICD-10-CM | POA: Diagnosis not present

## 2023-06-03 DIAGNOSIS — Z853 Personal history of malignant neoplasm of breast: Secondary | ICD-10-CM | POA: Diagnosis not present

## 2023-06-03 DIAGNOSIS — I2510 Atherosclerotic heart disease of native coronary artery without angina pectoris: Secondary | ICD-10-CM | POA: Diagnosis not present

## 2023-06-03 DIAGNOSIS — I252 Old myocardial infarction: Secondary | ICD-10-CM | POA: Diagnosis not present

## 2023-06-03 DIAGNOSIS — E876 Hypokalemia: Secondary | ICD-10-CM | POA: Diagnosis not present

## 2023-06-03 DIAGNOSIS — Z9181 History of falling: Secondary | ICD-10-CM | POA: Diagnosis not present

## 2023-06-03 DIAGNOSIS — E785 Hyperlipidemia, unspecified: Secondary | ICD-10-CM | POA: Diagnosis not present

## 2023-06-03 DIAGNOSIS — R7303 Prediabetes: Secondary | ICD-10-CM | POA: Diagnosis not present

## 2023-06-03 DIAGNOSIS — I10 Essential (primary) hypertension: Secondary | ICD-10-CM | POA: Diagnosis not present

## 2023-06-03 DIAGNOSIS — Z955 Presence of coronary angioplasty implant and graft: Secondary | ICD-10-CM | POA: Diagnosis not present

## 2023-06-03 DIAGNOSIS — M1990 Unspecified osteoarthritis, unspecified site: Secondary | ICD-10-CM | POA: Diagnosis not present

## 2023-06-03 DIAGNOSIS — K219 Gastro-esophageal reflux disease without esophagitis: Secondary | ICD-10-CM | POA: Diagnosis not present

## 2023-06-03 DIAGNOSIS — E871 Hypo-osmolality and hyponatremia: Secondary | ICD-10-CM | POA: Diagnosis not present

## 2023-06-03 DIAGNOSIS — F32A Depression, unspecified: Secondary | ICD-10-CM | POA: Diagnosis not present

## 2023-06-03 DIAGNOSIS — M810 Age-related osteoporosis without current pathological fracture: Secondary | ICD-10-CM | POA: Diagnosis not present

## 2023-06-03 DIAGNOSIS — Z7901 Long term (current) use of anticoagulants: Secondary | ICD-10-CM | POA: Diagnosis not present

## 2023-06-03 DIAGNOSIS — S82891D Other fracture of right lower leg, subsequent encounter for closed fracture with routine healing: Secondary | ICD-10-CM | POA: Diagnosis not present

## 2023-06-03 DIAGNOSIS — E559 Vitamin D deficiency, unspecified: Secondary | ICD-10-CM | POA: Diagnosis not present

## 2023-06-03 NOTE — Telephone Encounter (Signed)
Copied from CRM [id number]. Topic: Clinical - Home Health Verbal Orders >> Jun 03, 2023  1:44 PM Claudine Mouton wrote: Caller/Agency: [facility] Callback Number: [phone] Service Requested: Physical Therapy Frequency: 1 week for 5 weeks.  Any new concerns about the patient? Yes. Marcelino Duster advised that there are some inconsistencies in weight bearing status from hospital. Marcelino Duster Dr. Lawerance Bach to clarify what patient's real weight bearing status is at next visit.

## 2023-06-03 NOTE — Telephone Encounter (Signed)
Okay for orders? 

## 2023-06-06 ENCOUNTER — Encounter: Payer: Self-pay | Admitting: Internal Medicine

## 2023-06-06 NOTE — Patient Instructions (Addendum)
      Blood work was ordered.       Medications changes include :   None    A referral was ordered and someone will call you to schedule an appointment.     Return for follow up as scheduled.

## 2023-06-06 NOTE — Progress Notes (Unsigned)
Subjective:    Patient ID: Sarah Hendricks, female    DOB: 08-08-1945, 78 y.o.   MRN: [id number]     HPI Sarah Hendricks is here for follow up from the hospital  Admitted 1/13-1/14 for fall, right ankle fracture, new onset A-fib  She fell at home and went to the ED for right ankle pain.  She had a GI bug for 3 days prior and had nausea, vomiting and poor appetite.  She was not drinking many fluids and was dehydrated.  She felt lightheaded and fell and in the process twisted her right ankle.  She was not able to stand due to the pain.  Blood work showed a sodium of 124, serum osmolality 280, urine sodium 32, UA without infection, negative COVID/flu/RSV.  EKG showed A-fib with a rate of 94, LVH.  Chest x-ray showed old granulomatous disease, no acute cardiopulmonary disease.  X-ray right ankle shows acute mildly comminuted and displaced fracture of lateral malleolus.  She received fluids, pain medication and was started on Eliquis.  Fall, syncope, right ankle fracture: Recent GI bug with dehydration-lightheadedness Twisted her ankle-x-ray shows lateral malleolus fracture Placed in posterior splint Admitted for PT/OT evaluation Weightbearing as tolerated Percocet, IV Dilaudid given for pain Discharged home in cam boot Follow-up with orthopedics as an outpatient  New onset A-fib Found to be in A-fib Heart rate 70s-90s denied palpitations, chest pain or dizziness Magnesium, phosphorus normal  started on Eliquis 5 mg twice daily Continue propranolol Echocardiogram showed EF 45-50%, mild LVH, LV diastolic parameters indeterminate. To follow in A-fib clinic as an outpatient  Hyponatremia: Recent gastroenteritis complicated by dehydration Sodium on arrival 124 S/p 1 L IV NS, sodium improved to 132 on day of discharge had an additional 1 L IV normal saline  Mild hypokalemia: Resolved  Hypertension: BP stable, controlled Continue losartan  CAD s/p PCI, HLD: Continue  rosuvastatin Aspirin discontinued-started on Eliquis  Overall doing okay.  Her daughter is here with her today.  She is concerned that she hit her head harder than realized because she is experienced some mild memory issues.  This is new since the fall.  Her stress level is also higher.  She is not able to drive and her daughter has had to come up and help them.  She worries a lot about her husband who has recurrent falls.  Medications and allergies reviewed with patient and updated if appropriate.  Current Outpatient Medications on File Prior to Visit  Medication Sig Dispense Refill   apixaban (ELIQUIS) 5 MG TABS tablet Take 1 tablet (5 mg total) by mouth 2 (two) times daily. 60 tablet 0   CALCIUM MAGNESIUM ZINC PO Take 1 tablet by mouth every other day.     Cholecalciferol (EQL VITAMIN D3) 1000 units tablet Take 1 tablet (1,000 Units total) by mouth daily.     escitalopram (LEXAPRO) 10 MG tablet TAKE 1 TABLET(10 MG) BY MOUTH DAILY (Patient taking differently: Take 10 mg by mouth daily as needed (Anxiety).) 90 tablet 1   losartan (COZAAR) 100 MG tablet TAKE 1 TABLET(100 MG) BY MOUTH DAILY 90 tablet 3   Omega-3 Fatty Acids (FISH OIL) 1200 MG CAPS Take 1 capsule by mouth daily.     polyethylene glycol (MIRALAX / GLYCOLAX) 17 g packet Take 17 g by mouth daily as needed for mild constipation. 14 each 0   propranolol (INDERAL) 80 MG tablet Take 1 tablet (80 mg total) by mouth 2 (two) times daily. 180 tablet 3  rosuvastatin (CRESTOR) 10 MG tablet Take 1 tablet (10 mg total) by mouth daily. 90 tablet 3   naloxone (NARCAN) 0.4 MG/ML injection Inject 1 mL (0.4 mg total) into the vein as needed. (Patient not taking: Reported on 06/07/2023) 1 mL 0   oxyCODONE (OXY IR/ROXICODONE) 5 MG immediate release tablet Take 1 tablet (5 mg total) by mouth every 6 (six) hours as needed for up to 10 days for moderate pain (pain score 4-6), breakthrough pain or severe pain (pain score 7-10). (Patient not taking: Reported  on 06/07/2023) 40 tablet 0   No current facility-administered medications on file prior to visit.     Review of Systems  Constitutional:  Negative for fatigue and fever.  Respiratory:  Negative for shortness of breath.   Cardiovascular:  Positive for palpitations (maybe yesterday). Negative for chest pain and leg swelling.  Neurological:  Negative for light-headedness and headaches.  Psychiatric/Behavioral:  Negative for confusion.        Objective:   Vitals:   06/07/23 1048  BP: 122/82  Pulse: 95  SpO2: 97%   BP Readings from Last 3 Encounters:  06/07/23 122/82  05/31/23 135/85  03/15/23 120/82   Wt Readings from Last 3 Encounters:  06/07/23 150 lb 3.2 oz (68.1 kg)  05/29/23 156 lb 1.4 oz (70.8 kg)  03/15/23 156 lb (70.8 kg)   Body mass index is 26.61 kg/m.    Physical Exam     Lab Results  Component Value Date   WBC 5.6 05/31/2023   HGB 10.9 (L) 05/31/2023   HCT 32.1 (L) 05/31/2023   PLT 242 05/31/2023   GLUCOSE 88 05/31/2023   CHOL 137 01/28/2023   TRIG 162.0 (H) 01/28/2023   HDL 50.60 01/28/2023   LDLDIRECT 87.0 06/12/2018   LDLCALC 54 01/28/2023   ALT 13 05/30/2023   AST 18 05/30/2023   NA 132 (L) 05/31/2023   K 3.5 05/31/2023   CL 100 05/31/2023   CREATININE 0.86 05/31/2023   BUN 11 05/31/2023   CO2 25 05/31/2023   TSH 1.262 05/30/2023   INR 1.3 (H) 05/30/2023   HGBA1C 5.8 01/28/2023     Assessment & Plan:    See Problem List for Assessment and Plan of chronic medical problems.

## 2023-06-07 ENCOUNTER — Ambulatory Visit (INDEPENDENT_AMBULATORY_CARE_PROVIDER_SITE_OTHER): Payer: Medicare Other | Admitting: Internal Medicine

## 2023-06-07 ENCOUNTER — Encounter: Payer: Self-pay | Admitting: Internal Medicine

## 2023-06-07 ENCOUNTER — Ambulatory Visit: Payer: Medicare Other | Admitting: Sports Medicine

## 2023-06-07 VITALS — BP 122/82 | HR 95 | Ht 63.0 in | Wt 150.2 lb

## 2023-06-07 VITALS — BP 122/82 | HR 95 | Ht 63.0 in | Wt 150.0 lb

## 2023-06-07 DIAGNOSIS — I4891 Unspecified atrial fibrillation: Secondary | ICD-10-CM

## 2023-06-07 DIAGNOSIS — S82891A Other fracture of right lower leg, initial encounter for closed fracture: Secondary | ICD-10-CM

## 2023-06-07 DIAGNOSIS — E7849 Other hyperlipidemia: Secondary | ICD-10-CM

## 2023-06-07 DIAGNOSIS — I10 Essential (primary) hypertension: Secondary | ICD-10-CM | POA: Diagnosis not present

## 2023-06-07 DIAGNOSIS — M25571 Pain in right ankle and joints of right foot: Secondary | ICD-10-CM

## 2023-06-07 NOTE — Assessment & Plan Note (Signed)
Chronic LDL have been at goal Continue Crestor 10 mg daily  Lab Results  Component Value Date   LDLCALC 54 01/28/2023

## 2023-06-07 NOTE — Assessment & Plan Note (Addendum)
New Occurred after fall-right malleolus fracture In cam boot Seen sports medicine today Has not taken the pain medication Continue Tylenol as needed which is controlling her pain

## 2023-06-07 NOTE — Telephone Encounter (Signed)
LVM with Sarah Hendricks in regards to verbal orders

## 2023-06-07 NOTE — Patient Instructions (Addendum)
Ortho referral  Recommend non weightbearing

## 2023-06-07 NOTE — Assessment & Plan Note (Addendum)
Neck Blood pressure well-controlled She is monitoring at home and it has been controlled Continue losartan 100 mg daily, propranolol 80 mg twice daily

## 2023-06-07 NOTE — Progress Notes (Signed)
[name] D.[facility] [address] [place] [address] Phone: [phone]   Assessment and Plan:     1. Closed fracture of right ankle, initial encounter 2. Acute right ankle pain  - Acute, complicated, initial sports medicine visit - Acute, 3 mm displacement, comminuted fracture of lateral malleolus that extends into the ankle mortise - Patient asked to be seen today and was hopeful that we could use conservative, nonsurgical treatment.  Unfortunately, based on >2 mm displacement, comminuted fracture, fracture that extends into ankle mortise, fracture through fibular metaphysis, pain with partial weightbearing, I believe patient will likely need surgical intervention.  Referral to orthopedic surgery placed today and we will provide patient with phone number to contact the clinic - Recommend nonweightbearing until evaluated by orthopedic surgery - Start Tylenol 500 to 1000 mg tablets 2-3 times a day for day-to-day pain relief - May use opioid medications prescribed from hospital for severe breakthrough pain, though recommend limiting its use to prevent altered mental status and additional falls   Pertinent previous records reviewed include right ankle x-ray 05/29/2023, hospital discharge summary 05/29/2023  Follow Up: As needed   Subjective:   I, Jerene Canny, am serving as a Neurosurgeon for Doctor Richardean Sale  Chief Complaint: right ankle pain   HPI:   06/07/23 Patient is a 78 year old female with right ankle pain. Patient sates she fell 05/28/2022 she was seen in ED. She was turning her foot went one way and her body went the other way. She is in a boot and roll aide   Relevant Historical Information: Hypertension, osteoporosis, prediabetes  Additional pertinent review of systems negative.   Current Outpatient Medications:    apixaban (ELIQUIS) 5 MG TABS tablet, Take 1 tablet (5 mg total) by mouth 2 (two) times daily., Disp: 60  tablet, Rfl: 0   CALCIUM MAGNESIUM ZINC PO, Take 1 tablet by mouth every other day., Disp: , Rfl:    Cholecalciferol (EQL VITAMIN D3) 1000 units tablet, Take 1 tablet (1,000 Units total) by mouth daily., Disp: , Rfl:    escitalopram (LEXAPRO) 10 MG tablet, TAKE 1 TABLET(10 MG) BY MOUTH DAILY (Patient taking differently: Take 10 mg by mouth daily as needed (Anxiety).), Disp: 90 tablet, Rfl: 1   losartan (COZAAR) 100 MG tablet, TAKE 1 TABLET(100 MG) BY MOUTH DAILY, Disp: 90 tablet, Rfl: 3   naloxone (NARCAN) 0.4 MG/ML injection, Inject 1 mL (0.4 mg total) into the vein as needed. (Patient not taking: Reported on 06/07/2023), Disp: 1 mL, Rfl: 0   Omega-3 Fatty Acids (FISH OIL) 1200 MG CAPS, Take 1 capsule by mouth daily., Disp: , Rfl:    oxyCODONE (OXY IR/ROXICODONE) 5 MG immediate release tablet, Take 1 tablet (5 mg total) by mouth every 6 (six) hours as needed for up to 10 days for moderate pain (pain score 4-6), breakthrough pain or severe pain (pain score 7-10). (Patient not taking: Reported on 06/07/2023), Disp: 40 tablet, Rfl: 0   polyethylene glycol (MIRALAX / GLYCOLAX) 17 g packet, Take 17 g by mouth daily as needed for mild constipation., Disp: 14 each, Rfl: 0   propranolol (INDERAL) 80 MG tablet, Take 1 tablet (80 mg total) by mouth 2 (two) times daily., Disp: 180 tablet, Rfl: 3   rosuvastatin (CRESTOR) 10 MG tablet, Take 1 tablet (10 mg total) by mouth daily., Disp: 90 tablet, Rfl: 3   Objective:     Vitals:   06/07/23 1145  BP: 122/82  Pulse: 95  SpO2: 97%  Weight: 150 lb (68 kg)  Height: 5\' 3"  (1.6 m)      Body mass index is 26.57 kg/m.    Physical Exam:    Gen: Appears well, nad, nontoxic and pleasant Psych: Alert and oriented, appropriate mood and affect Skin: no susupicious lesions or rashes  Right ankle:  Patient with ankle in cam walker.  Using rollator for assistance with ambulation  Electronically signed by:  [name] D.[facility] 12:02 PM  06/07/23

## 2023-06-07 NOTE — Assessment & Plan Note (Addendum)
New Diagnosed when she was in the emergency room She does states she probably had an episode of palpitations prior to the emergency room Placed on Eliquis 5 mg twice daily On propranolol 80 mg twice daily Rate controlled Asymptomatic Since she is doing fairly well would not make any changes-likely propranolol should be changed to metoprolol or carvedilol Discussed that she will likely need a Holter monitor, but will let cardiology do it since they can order that for a longer duration than I am able Continue Eliquis-she is taking it 12 hours apart She will continue to monitor her heart rate and blood pressure and if there are any issues she will let me know and we can adjust medication as needed Will schedule follow-up with cardiology

## 2023-06-09 DIAGNOSIS — K219 Gastro-esophageal reflux disease without esophagitis: Secondary | ICD-10-CM | POA: Diagnosis not present

## 2023-06-09 DIAGNOSIS — I10 Essential (primary) hypertension: Secondary | ICD-10-CM | POA: Diagnosis not present

## 2023-06-09 DIAGNOSIS — F419 Anxiety disorder, unspecified: Secondary | ICD-10-CM | POA: Diagnosis not present

## 2023-06-09 DIAGNOSIS — R7303 Prediabetes: Secondary | ICD-10-CM | POA: Diagnosis not present

## 2023-06-09 DIAGNOSIS — I480 Paroxysmal atrial fibrillation: Secondary | ICD-10-CM | POA: Diagnosis not present

## 2023-06-09 DIAGNOSIS — F32A Depression, unspecified: Secondary | ICD-10-CM | POA: Diagnosis not present

## 2023-06-09 DIAGNOSIS — M1990 Unspecified osteoarthritis, unspecified site: Secondary | ICD-10-CM | POA: Diagnosis not present

## 2023-06-09 DIAGNOSIS — E559 Vitamin D deficiency, unspecified: Secondary | ICD-10-CM | POA: Diagnosis not present

## 2023-06-09 DIAGNOSIS — E871 Hypo-osmolality and hyponatremia: Secondary | ICD-10-CM | POA: Diagnosis not present

## 2023-06-09 DIAGNOSIS — S82891D Other fracture of right lower leg, subsequent encounter for closed fracture with routine healing: Secondary | ICD-10-CM | POA: Diagnosis not present

## 2023-06-09 DIAGNOSIS — M810 Age-related osteoporosis without current pathological fracture: Secondary | ICD-10-CM | POA: Diagnosis not present

## 2023-06-09 DIAGNOSIS — Z791 Long term (current) use of non-steroidal anti-inflammatories (NSAID): Secondary | ICD-10-CM | POA: Diagnosis not present

## 2023-06-09 DIAGNOSIS — E785 Hyperlipidemia, unspecified: Secondary | ICD-10-CM | POA: Diagnosis not present

## 2023-06-09 DIAGNOSIS — I2510 Atherosclerotic heart disease of native coronary artery without angina pectoris: Secondary | ICD-10-CM | POA: Diagnosis not present

## 2023-06-09 DIAGNOSIS — E876 Hypokalemia: Secondary | ICD-10-CM | POA: Diagnosis not present

## 2023-06-09 DIAGNOSIS — I252 Old myocardial infarction: Secondary | ICD-10-CM | POA: Diagnosis not present

## 2023-06-13 ENCOUNTER — Telehealth: Payer: Self-pay | Admitting: Cardiology

## 2023-06-13 NOTE — Telephone Encounter (Signed)
Spoke with patient, she feels fine HR in the 70's-80's  Has broken foot and will likely need surgery, unable to drive Scheduled to see surgeon this week Per Dr Cristal Deer ok to do virtual visit since patient has to depend on driver  Scheduled for mychart visit 1/29, patient agreeable to plan

## 2023-06-13 NOTE — Telephone Encounter (Signed)
Patient c/o Palpitations:  STAT if patient reporting lightheadedness, shortness of breath, or chest pain  How long have you had palpitations/irregular HR/ Afib? Are you having the symptoms now? Last Thursday she felt out of rhythm all day. No  Are you currently experiencing lightheadedness, SOB or CP? no  Do you have a history of afib (atrial fibrillation) or irregular heart rhythm? yes  Have you checked your BP or HR? (document readings if available): yes   Are you experiencing any other symptoms? no

## 2023-06-15 ENCOUNTER — Ambulatory Visit (INDEPENDENT_AMBULATORY_CARE_PROVIDER_SITE_OTHER): Payer: Medicare Other | Admitting: Student

## 2023-06-15 ENCOUNTER — Encounter (HOSPITAL_BASED_OUTPATIENT_CLINIC_OR_DEPARTMENT_OTHER): Payer: Self-pay | Admitting: Cardiology

## 2023-06-15 ENCOUNTER — Telehealth (HOSPITAL_BASED_OUTPATIENT_CLINIC_OR_DEPARTMENT_OTHER): Payer: Medicare Other | Admitting: Cardiology

## 2023-06-15 ENCOUNTER — Other Ambulatory Visit: Payer: Self-pay

## 2023-06-15 ENCOUNTER — Other Ambulatory Visit (HOSPITAL_BASED_OUTPATIENT_CLINIC_OR_DEPARTMENT_OTHER): Payer: Self-pay

## 2023-06-15 ENCOUNTER — Encounter (HOSPITAL_BASED_OUTPATIENT_CLINIC_OR_DEPARTMENT_OTHER): Payer: Self-pay | Admitting: Student

## 2023-06-15 ENCOUNTER — Telehealth: Payer: Self-pay | Admitting: Cardiology

## 2023-06-15 ENCOUNTER — Ambulatory Visit (HOSPITAL_BASED_OUTPATIENT_CLINIC_OR_DEPARTMENT_OTHER): Payer: Medicare Other | Admitting: Student

## 2023-06-15 ENCOUNTER — Encounter (HOSPITAL_BASED_OUTPATIENT_CLINIC_OR_DEPARTMENT_OTHER): Payer: Self-pay | Admitting: Orthopaedic Surgery

## 2023-06-15 VITALS — BP 138/67 | HR 73 | Ht 63.0 in | Wt 145.0 lb

## 2023-06-15 DIAGNOSIS — I480 Paroxysmal atrial fibrillation: Secondary | ICD-10-CM | POA: Diagnosis not present

## 2023-06-15 DIAGNOSIS — D6869 Other thrombophilia: Secondary | ICD-10-CM | POA: Diagnosis not present

## 2023-06-15 DIAGNOSIS — Z7901 Long term (current) use of anticoagulants: Secondary | ICD-10-CM | POA: Diagnosis not present

## 2023-06-15 DIAGNOSIS — Z01810 Encounter for preprocedural cardiovascular examination: Secondary | ICD-10-CM

## 2023-06-15 DIAGNOSIS — S82841A Displaced bimalleolar fracture of right lower leg, initial encounter for closed fracture: Secondary | ICD-10-CM

## 2023-06-15 MED ORDER — OXYCODONE HCL 5 MG PO TABS
5.0000 mg | ORAL_TABLET | ORAL | 0 refills | Status: AC | PRN
Start: 2023-06-15 — End: 2023-06-30
  Filled 2023-06-15: qty 15, 3d supply, fill #0

## 2023-06-15 NOTE — Progress Notes (Unsigned)
Virtual Visit via Video Note   Because of Sarah Hendricks's co-morbid illnesses, she is at least at moderate risk for complications without adequate follow up.  This format is felt to be most appropriate for this patient at this time.  All issues noted in this document were discussed and addressed.  A limited physical exam was performed with this format.  Please refer to the patient's chart for her consent to telehealth for [facility].       Date:  06/15/2023   ID:  Sarah Hendricks, DOB 28-Apr-1946, MRN [id number] The patient was identified using 2 identifiers.  Patient Location: Home Provider Location: Office/Clinic   PCP:  Pincus Sanes, MD   [facility] Providers Cardiologist:  Tobias Alexander, MD { Click to update primary MD,subspecialty MD or APP then REFRESH:1}    Evaluation Performed:  Follow-Up Visit  Chief Complaint:  preoperative cardiovascular evaluation.  History of Present Illness:    Sarah Hendricks is a 78 y.o. female with with a hx of HTN, HLD, CAD s/p PCI in 2008 (done in [place] prior to moving), and breast cancer s/p lumpectomy and XRT (no chemo). She was previously followed by Dr. Shari Prows and Dr. Delton See, and she established care with me on 06/15/23.  Today: Planned for surgery  Had norovirus, went to ER, was dehydrated, found to be in afib. Only felt it last week, hasn't felt it since.    Past Medical History:  Diagnosis Date   Allergy    Arthritis    Breast cancer (HCC) 1994   lumpectomy and XRT, no chemo   CAD (coronary artery disease) 2008   PTCA, in [place]   GERD (gastroesophageal reflux disease)    History of chicken pox    Hyperlipidemia    Hypertension    Myocardial infarction (HCC) 03/15/2007   stent placed   Vitamin D deficiency    Past Surgical History:  Procedure Laterality Date   BREAST BIOPSY Left 1994   BREAST LUMPECTOMY Left 1994   CATARACT EXTRACTION, BILATERAL     CORONARY ANGIOPLASTY WITH STENT PLACEMENT   2008   CRANIECTOMY FOR DEPRESSED SKULL FRACTURE  1986   TONSILLECTOMY  1966     Current Meds  Medication Sig   apixaban (ELIQUIS) 5 MG TABS tablet Take 1 tablet (5 mg total) by mouth 2 (two) times daily.   CALCIUM MAGNESIUM ZINC PO Take 1 tablet by mouth every other day.   Cholecalciferol (EQL VITAMIN D3) 1000 units tablet Take 1 tablet (1,000 Units total) by mouth daily.   losartan (COZAAR) 100 MG tablet TAKE 1 TABLET(100 MG) BY MOUTH DAILY   Omega-3 Fatty Acids (FISH OIL) 1200 MG CAPS Take 1 capsule by mouth daily.   oxyCODONE (ROXICODONE) 5 MG immediate release tablet Take 1 tablet (5 mg total) by mouth every 4 (four) hours as needed for up to 15 days for severe pain (pain score 7-10) or breakthrough pain.   polyethylene glycol (MIRALAX / GLYCOLAX) 17 g packet Take 17 g by mouth daily as needed for mild constipation.   propranolol (INDERAL) 80 MG tablet Take 1 tablet (80 mg total) by mouth 2 (two) times daily.   rosuvastatin (CRESTOR) 10 MG tablet Take 1 tablet (10 mg total) by mouth daily.     Allergies:   Acetaminophen-codeine, Tigan [trimethobenzamide], Alendronate, Atorvastatin, Codeine, and Simvastatin   Social History   Tobacco Use   Smoking status: Never   Smokeless tobacco: Never  Vaping Use   Vaping status:  Never Used  Substance Use Topics   Alcohol use: No   Drug use: No     Family Hx: The patient's family history includes Arthritis in her father; Breast cancer in her mother; Heart disease in her brother, father, maternal grandfather, and mother; Hypertension in her mother; Stroke in her maternal grandmother and mother. There is no history of Colon cancer, Esophageal cancer, Rectal cancer, or Stomach cancer.  ROS:   Please see the history of present illness.    *** All other systems reviewed and are negative.   Prior CV studies:   The following studies were reviewed today:  ***  Labs/Other Tests and Data Reviewed:    EKG:  {EKG/Telemetry Strips  Reviewed:[phone]}  Recent Labs: 05/29/2023: Magnesium 2.0 05/30/2023: ALT 13; TSH 1.262 05/31/2023: BUN 11; Creatinine, Ser 0.86; Hemoglobin 10.9; Platelets 242; Potassium 3.5; Sodium 132   Recent Lipid Panel Lab Results  Component Value Date/Time   CHOL 137 01/28/2023 09:39 AM   TRIG 162.0 (H) 01/28/2023 09:39 AM   HDL 50.60 01/28/2023 09:39 AM   CHOLHDL 3 01/28/2023 09:39 AM   LDLCALC 54 01/28/2023 09:39 AM   LDLDIRECT 87.0 06/12/2018 09:46 AM    Wt Readings from Last 3 Encounters:  06/15/23 145 lb (65.8 kg)  06/07/23 150 lb (68 kg)  06/07/23 150 lb 3.2 oz (68.1 kg)     Risk Assessment/Calculations:   {Does this patient have ATRIAL FIBRILLATION?[phone]}      Objective:    Vital Signs:  BP 138/67   Pulse 73   Ht 5\' 3"  (1.6 m)   Wt 145 lb (65.8 kg)   BMI 25.69 kg/m    {HeartCare Virtual Exam (Optional):[phone]::"VITAL SIGNS:  reviewed"}  ASSESSMENT & PLAN:    Preoperative cardiovascular evaluation According to the Revised Cardiac Risk Index (RCRI), her Perioperative Risk of Major Cardiac Event is (%): 0.9  Her Functional Capacity in METs is: 8.33 according to the Duke Activity Status Index (DASI).  The patient is not currently having active cardiac symptoms, and they can achieve >4 METs of activity.  According to ACC/AHA Guidelines, no further testing is needed.  Proceed with surgery at acceptable risk.  Our service is available as needed in the peri-operative period.      New atrial fibrillation   Time:   Today, I have spent 18 minutes with the patient with telehealth technology discussing the above problems.     Follow Up:  {F/U Format:[phone]} {follow up:15908}  Signed, Jodelle Red, MD  06/15/2023 2:55 PM    [facility]

## 2023-06-15 NOTE — Telephone Encounter (Signed)
Patient is following-up on her virtual visit for this afternoon and wants an invite sent to her at [email or web address] or her son Onalee Hua) at [email or web address]

## 2023-06-15 NOTE — Telephone Encounter (Signed)
April with Dr. Serena Croissant office, Cyndia Skeeters, is following up regarding clearance request. She states Dr. Steward Drone would like updates as soon as possible. Per telehealth, okay to proceed with surgery, but doesn't mention holding Eliquis. Please advise.

## 2023-06-15 NOTE — Telephone Encounter (Signed)
   Pre-operative Risk Assessment    Patient Name: Sarah Hendricks  DOB: 12/23/1945 MRN: [id number]   Date of last office visit: 06/15/23 DR. CHRISTOPHER Date of next office visit: 10/24/23 DR. CHRISTOPHER   Request for Surgical Clearance    Procedure: RIGHT OPEN REDUCTION INTERNAL FIXATION (ORIF) ANKLE FRACTURE    Date of Surgery:  Clearance 06/20/23                                Surgeon:  DR. Steward Drone Surgeon's Group or Practice Name:   Phone number:  [phone] Fax number: [phone]    Type of Clearance Requested:   - Medical  - Pharmacy:  Hold Apixaban (Eliquis)     Type of Anesthesia:  General    Additional requests/questions:    Elpidio Anis   06/15/2023, 4:27 PM

## 2023-06-15 NOTE — Telephone Encounter (Signed)
Notes faxed to surgeon. This phone note will be removed from the preop pool. Tereso Newcomer, PA-C  06/15/2023 4:42 PM

## 2023-06-15 NOTE — Telephone Encounter (Signed)
Correct email attached to [employer]. Invite will be sent prior to appt time.

## 2023-06-15 NOTE — Progress Notes (Signed)
Chief Complaint: Right ankle fracture     History of Present Illness:    Sarah Hendricks is a 78 y.o. female presenting today for evaluation of a right ankle fracture.  She sustained this on 05/29/23 after having a fall in her kitchen at home.  This was due to a lightheaded spell and upon evaluation in the emergency department she was found to have dehydration and new onset a-fib.  She has since been started on Eliquis 10 mg daily for anticoagulation.  For her ankle, she has been in a CAM boot and using a walker for light weight-bearing on the right foot.  Takes Tylenol as needed for pain which is moderate today.  Denies any numbness or tingling.  Patient also has known history of bilateral knee osteoarthritis which has been aggravated after the fall, particularly in the right knee.   Surgical History:   None  PMH/PSH/Family History/Social History/Meds/Allergies:    Past Medical History:  Diagnosis Date   Allergy    Arthritis    Breast cancer (HCC) 1994   lumpectomy and XRT, no chemo   CAD (coronary artery disease) 2008   PTCA, in [place]   GERD (gastroesophageal reflux disease)    History of chicken pox    Hyperlipidemia    Hypertension    Myocardial infarction (HCC) 03/15/2007   stent placed   Vitamin D deficiency    Past Surgical History:  Procedure Laterality Date   BREAST BIOPSY Left 1994   BREAST LUMPECTOMY Left 1994   CATARACT EXTRACTION, BILATERAL     CORONARY ANGIOPLASTY WITH STENT PLACEMENT  2008   CRANIECTOMY FOR DEPRESSED SKULL FRACTURE  1986   TONSILLECTOMY  1966   Social History   Socioeconomic History   Marital status: Married    Spouse name: Not on file   Number of children: Not on file   Years of education: Not on file   Highest education level: Not on file  Occupational History   Not on file  Tobacco Use   Smoking status: Never   Smokeless tobacco: Never  Vaping Use   Vaping status: Never Used  Substance and  Sexual Activity   Alcohol use: No   Drug use: No   Sexual activity: Not on file  Other Topics Concern   Not on file  Social History Narrative   Married, lives with spouse   Retired from [employer] irregularly, rides an exercise bike   Social Drivers of Health   Financial Resource Strain: Low Risk  (09/23/2022)   Overall Financial Resource Strain (CARDIA)    Difficulty of Paying Living Expenses: Not hard at all  Food Insecurity: No Food Insecurity (05/30/2023)   Hunger Vital Sign    Worried About Running Out of Food in the Last Year: Never true    Ran Out of Food in the Last Year: Never true  Transportation Needs: No Transportation Needs (05/30/2023)   PRAPARE - Administrator, Civil Service (Medical): No    Lack of Transportation (Non-Medical): No  Physical Activity: Sufficiently Active (09/23/2022)   Exercise Vital Sign    Days of Exercise per Week: 5 days    Minutes of Exercise per Session: 30 min  Stress: No Stress Concern Present (09/23/2022)   [employer] of Occupational Health -  Occupational Stress Questionnaire    Feeling of Stress : Not at all  Social Connections: Socially Integrated (05/30/2023)   Social Connection and Isolation Panel [NHANES]    Frequency of Communication with Friends and Family: More than three times a week    Frequency of Social Gatherings with Friends and Family: More than three times a week    Attends Religious Services: More than 4 times per year    Active Member of [employer] or Organizations: No    Attends Engineer, structural: More than 4 times per year    Marital Status: Married   Family History  Problem Relation Age of Onset   Breast cancer Mother    Hypertension Mother    Heart disease Mother    Stroke Mother    Arthritis Father    Heart disease Father        died at 83 of heart attack   Heart disease Brother    Stroke Maternal Grandmother    Heart disease Maternal Grandfather    Colon cancer Neg Hx     Esophageal cancer Neg Hx    Rectal cancer Neg Hx    Stomach cancer Neg Hx    Allergies  Allergen Reactions   Acetaminophen-Codeine Other (See Comments)    Bad dreams   Tigan [Trimethobenzamide] Other (See Comments)    Bad dreams   Alendronate     Caused leg pains    Atorvastatin Other (See Comments)    Pt reports "causes bilateral leg pains."   Codeine     Bad dreams   Simvastatin Other (See Comments)    Leg cramps   Current Outpatient Medications  Medication Sig Dispense Refill   apixaban (ELIQUIS) 5 MG TABS tablet Take 1 tablet (5 mg total) by mouth 2 (two) times daily. 60 tablet 0   CALCIUM MAGNESIUM ZINC PO Take 1 tablet by mouth every other day.     Cholecalciferol (EQL VITAMIN D3) 1000 units tablet Take 1 tablet (1,000 Units total) by mouth daily.     escitalopram (LEXAPRO) 10 MG tablet TAKE 1 TABLET(10 MG) BY MOUTH DAILY (Patient taking differently: Take 10 mg by mouth daily as needed (Anxiety).) 90 tablet 1   losartan (COZAAR) 100 MG tablet TAKE 1 TABLET(100 MG) BY MOUTH DAILY 90 tablet 3   naloxone (NARCAN) 0.4 MG/ML injection Inject 1 mL (0.4 mg total) into the vein as needed. (Patient not taking: Reported on 06/07/2023) 1 mL 0   Omega-3 Fatty Acids (FISH OIL) 1200 MG CAPS Take 1 capsule by mouth daily.     polyethylene glycol (MIRALAX / GLYCOLAX) 17 g packet Take 17 g by mouth daily as needed for mild constipation. 14 each 0   propranolol (INDERAL) 80 MG tablet Take 1 tablet (80 mg total) by mouth 2 (two) times daily. 180 tablet 3   rosuvastatin (CRESTOR) 10 MG tablet Take 1 tablet (10 mg total) by mouth daily. 90 tablet 3   No current facility-administered medications for this visit.   No results found.  Review of Systems:   A ROS was performed including pertinent positives and negatives as documented in the HPI.  Physical Exam :   Constitutional: NAD and appears stated age Neurological: Alert and oriented Psych: Appropriate affect and cooperative There were  no vitals taken for this visit.   Comprehensive Musculoskeletal Exam:    Right ankle moderately swollen with ecchymosis extending into the right foot.  Focal swelling and tenderness over the lateral malleolus without visible deformity.  Calf is supple and nontender.  Dorsalis pedis pulse 2+.  Brisk capillary refill to all 5 toes.  Distal neurosensory exam is intact.  Imaging:   Xray from 05/29/2023 (right ankle 3 views): Comminuted fracture of the distal fibula fracture with lateral displacement.  Deformity of the inferior medial malleolus suggesting acute or acute on chronic fracture.   I personally reviewed and interpreted the radiographs.   Assessment:   78 y.o. female with a bimalleolar ankle fracture that occurred due to a fall over 2 weeks ago.  The distal fibula has a few millimeters of displacement and is at the level of the mortise therefore I discussed that surgical management is recommended given the resulting instability.  There does appear to be involvement of the medial malleolus however this has minimal displacement and may not require fixation.  After discussion of the procedure and consideration of risks, benefits, and recovery, patient would like to proceed with surgical fixation.  Will plan to have her resume Eliquis for DVT prophylaxis.  She has Tylenol 500 mg at home but I will send Oxycodone 5 mg for breakthrough pain.  Should continue CAM boot with limited weight-bearing.  Plan :    - Plan for right ankle ORIF with Dr. Steward Drone     I personally saw and evaluated the patient, and participated in the management and treatment plan.  Hazle Nordmann, PA-C Orthopedics

## 2023-06-15 NOTE — Telephone Encounter (Signed)
I will forward this to the preop APP to review. I do see Dr. Cristal Deer cleared the opt , but do not see anything noted about holding Eliquis. I do not see that we received a clearance request.

## 2023-06-15 NOTE — Telephone Encounter (Signed)
   Patient Name: Sarah Hendricks  DOB: Sep 22, 1945 MRN: [id number]  Primary Cardiologist: Jodelle Red, MD  Reviewed office note from Dr. Cristal Deer today. Please see her note for complete details. Also discussed anticoagulation with Dr. Cristal Deer.  According to ACC/AHA guidelines, Idabelle Mcpeters is at acceptable risk for the planned procedure without further cardiovascular testing.   The patient can hold Elquis for 2 days prior to her procedure and resume as soon as possible after.  I will route this recommendation to the requesting party via Epic fax function and remove from pre-op pool.  Please call with questions.  Tereso Newcomer, PA-C 06/15/2023, 4:39 PM

## 2023-06-16 ENCOUNTER — Encounter (HOSPITAL_BASED_OUTPATIENT_CLINIC_OR_DEPARTMENT_OTHER): Payer: Self-pay

## 2023-06-16 DIAGNOSIS — E785 Hyperlipidemia, unspecified: Secondary | ICD-10-CM | POA: Diagnosis not present

## 2023-06-16 DIAGNOSIS — R7303 Prediabetes: Secondary | ICD-10-CM | POA: Diagnosis not present

## 2023-06-16 DIAGNOSIS — I2510 Atherosclerotic heart disease of native coronary artery without angina pectoris: Secondary | ICD-10-CM | POA: Diagnosis not present

## 2023-06-16 DIAGNOSIS — K219 Gastro-esophageal reflux disease without esophagitis: Secondary | ICD-10-CM | POA: Diagnosis not present

## 2023-06-16 DIAGNOSIS — S82891D Other fracture of right lower leg, subsequent encounter for closed fracture with routine healing: Secondary | ICD-10-CM | POA: Diagnosis not present

## 2023-06-16 DIAGNOSIS — F419 Anxiety disorder, unspecified: Secondary | ICD-10-CM | POA: Diagnosis not present

## 2023-06-16 DIAGNOSIS — E876 Hypokalemia: Secondary | ICD-10-CM | POA: Diagnosis not present

## 2023-06-16 DIAGNOSIS — I252 Old myocardial infarction: Secondary | ICD-10-CM | POA: Diagnosis not present

## 2023-06-16 DIAGNOSIS — M810 Age-related osteoporosis without current pathological fracture: Secondary | ICD-10-CM | POA: Diagnosis not present

## 2023-06-16 DIAGNOSIS — I480 Paroxysmal atrial fibrillation: Secondary | ICD-10-CM | POA: Diagnosis not present

## 2023-06-16 DIAGNOSIS — Z791 Long term (current) use of non-steroidal anti-inflammatories (NSAID): Secondary | ICD-10-CM | POA: Diagnosis not present

## 2023-06-16 DIAGNOSIS — F32A Depression, unspecified: Secondary | ICD-10-CM | POA: Diagnosis not present

## 2023-06-16 DIAGNOSIS — M1990 Unspecified osteoarthritis, unspecified site: Secondary | ICD-10-CM | POA: Diagnosis not present

## 2023-06-16 DIAGNOSIS — E871 Hypo-osmolality and hyponatremia: Secondary | ICD-10-CM | POA: Diagnosis not present

## 2023-06-16 DIAGNOSIS — I10 Essential (primary) hypertension: Secondary | ICD-10-CM | POA: Diagnosis not present

## 2023-06-16 DIAGNOSIS — E559 Vitamin D deficiency, unspecified: Secondary | ICD-10-CM | POA: Diagnosis not present

## 2023-06-17 ENCOUNTER — Ambulatory Visit (HOSPITAL_BASED_OUTPATIENT_CLINIC_OR_DEPARTMENT_OTHER): Payer: Self-pay | Admitting: Orthopaedic Surgery

## 2023-06-17 DIAGNOSIS — S82841A Displaced bimalleolar fracture of right lower leg, initial encounter for closed fracture: Secondary | ICD-10-CM

## 2023-06-19 NOTE — Anesthesia Preprocedure Evaluation (Signed)
Anesthesia Evaluation  Patient identified by MRN, date of birth, ID band Patient awake    Reviewed: Allergy & Precautions, NPO status , Patient's Chart, lab work & pertinent test results  Airway Mallampati: II  TM Distance: >3 FB Neck ROM: Full    Dental no notable dental hx. (+) Teeth Intact, Dental Advisory Given   Pulmonary neg pulmonary ROS   Pulmonary exam normal breath sounds clear to auscultation       Cardiovascular hypertension, + CAD and + Past MI (in 2008)  Normal cardiovascular exam+ dysrhythmias Atrial Fibrillation  Rhythm:Regular Rate:Normal     Neuro/Psych  PSYCHIATRIC DISORDERS Anxiety     negative neurological ROS     GI/Hepatic ,GERD  ,,  Endo/Other  negative endocrine ROS    Renal/GU      Musculoskeletal  (+) Arthritis ,    Abdominal   Peds  Hematology   Anesthesia Other Findings Breast CA  Reproductive/Obstetrics                             Anesthesia Physical Anesthesia Plan  ASA: 3  Anesthesia Plan: MAC   Post-op Pain Management: Regional block* and Minimal or no pain anticipated   Induction:   PONV Risk Score and Plan: 2 and Propofol infusion, Treatment may vary due to age or medical condition and Ondansetron  Airway Management Planned: Simple Face Mask  Additional Equipment: None  Intra-op Plan:   Post-operative Plan:   Informed Consent: I have reviewed the patients History and Physical, chart, labs and discussed the procedure including the risks, benefits and alternatives for the proposed anesthesia with the patient or authorized representative who has indicated his/her understanding and acceptance.     Dental advisory given  Plan Discussed with: CRNA and Surgeon  Anesthesia Plan Comments: (R pop  + R adductor w mac)        Anesthesia Quick Evaluation

## 2023-06-20 ENCOUNTER — Ambulatory Visit (HOSPITAL_BASED_OUTPATIENT_CLINIC_OR_DEPARTMENT_OTHER)
Admission: RE | Admit: 2023-06-20 | Discharge: 2023-06-20 | Disposition: A | Discharge status: Home / Self Care | Payer: Medicare Other | Attending: Orthopaedic Surgery | Admitting: Orthopaedic Surgery

## 2023-06-20 ENCOUNTER — Ambulatory Visit (HOSPITAL_BASED_OUTPATIENT_CLINIC_OR_DEPARTMENT_OTHER): Payer: Medicare Other | Admitting: Anesthesiology

## 2023-06-20 ENCOUNTER — Encounter (HOSPITAL_BASED_OUTPATIENT_CLINIC_OR_DEPARTMENT_OTHER)
Admission: RE | Disposition: A | Discharge status: Home / Self Care | Payer: Self-pay | Source: Home / Self Care | Attending: Orthopaedic Surgery

## 2023-06-20 ENCOUNTER — Other Ambulatory Visit: Payer: Self-pay

## 2023-06-20 ENCOUNTER — Ambulatory Visit (HOSPITAL_BASED_OUTPATIENT_CLINIC_OR_DEPARTMENT_OTHER): Payer: Self-pay | Admitting: Orthopaedic Surgery

## 2023-06-20 ENCOUNTER — Ambulatory Visit (HOSPITAL_COMMUNITY): Payer: Medicare Other

## 2023-06-20 ENCOUNTER — Encounter (HOSPITAL_BASED_OUTPATIENT_CLINIC_OR_DEPARTMENT_OTHER): Payer: Self-pay | Admitting: Orthopaedic Surgery

## 2023-06-20 DIAGNOSIS — I2510 Atherosclerotic heart disease of native coronary artery without angina pectoris: Secondary | ICD-10-CM | POA: Diagnosis not present

## 2023-06-20 DIAGNOSIS — F419 Anxiety disorder, unspecified: Secondary | ICD-10-CM | POA: Insufficient documentation

## 2023-06-20 DIAGNOSIS — I10 Essential (primary) hypertension: Secondary | ICD-10-CM | POA: Insufficient documentation

## 2023-06-20 DIAGNOSIS — I252 Old myocardial infarction: Secondary | ICD-10-CM | POA: Insufficient documentation

## 2023-06-20 DIAGNOSIS — Z7901 Long term (current) use of anticoagulants: Secondary | ICD-10-CM | POA: Diagnosis not present

## 2023-06-20 DIAGNOSIS — I4891 Unspecified atrial fibrillation: Secondary | ICD-10-CM | POA: Insufficient documentation

## 2023-06-20 DIAGNOSIS — S82841A Displaced bimalleolar fracture of right lower leg, initial encounter for closed fracture: Secondary | ICD-10-CM

## 2023-06-20 DIAGNOSIS — Z01818 Encounter for other preprocedural examination: Secondary | ICD-10-CM

## 2023-06-20 DIAGNOSIS — W19XXXA Unspecified fall, initial encounter: Secondary | ICD-10-CM | POA: Diagnosis not present

## 2023-06-20 DIAGNOSIS — Z4689 Encounter for fitting and adjustment of other specified devices: Secondary | ICD-10-CM | POA: Diagnosis not present

## 2023-06-20 DIAGNOSIS — M170 Bilateral primary osteoarthritis of knee: Secondary | ICD-10-CM | POA: Diagnosis not present

## 2023-06-20 HISTORY — DX: Displaced bimalleolar fracture of right lower leg, initial encounter for closed fracture: S82.841A

## 2023-06-20 HISTORY — DX: Anxiety disorder, unspecified: F41.9

## 2023-06-20 HISTORY — PX: ORIF ANKLE FRACTURE: SHX5408

## 2023-06-20 SURGERY — OPEN REDUCTION INTERNAL FIXATION (ORIF) ANKLE FRACTURE
Anesthesia: Monitor Anesthesia Care | Site: Ankle | Laterality: Right

## 2023-06-20 MED ORDER — FENTANYL CITRATE (PF) 100 MCG/2ML IJ SOLN: 25.0000 ug | INTRAMUSCULAR | Status: DC | PRN

## 2023-06-20 MED ORDER — VANCOMYCIN HCL 1 G IV SOLR
INTRAVENOUS | Status: DC | PRN
  Administered 2023-06-20: 1000 mg

## 2023-06-20 MED ORDER — TRANEXAMIC ACID-NACL 1000-0.7 MG/100ML-% IV SOLN
INTRAVENOUS | Status: AC
  Filled 2023-06-20: qty 100

## 2023-06-20 MED ORDER — PROPOFOL 10 MG/ML IV BOLUS
INTRAVENOUS | Status: DC | PRN
  Administered 2023-06-20: 100 ug/kg/min via INTRAVENOUS

## 2023-06-20 MED ORDER — GABAPENTIN 300 MG PO CAPS
300.0000 mg | ORAL_CAPSULE | Freq: Once | ORAL | Status: AC
  Administered 2023-06-20: 300 mg via ORAL

## 2023-06-20 MED ORDER — CEFAZOLIN SODIUM-DEXTROSE 2-4 GM/100ML-% IV SOLN
INTRAVENOUS | Status: AC
  Filled 2023-06-20: qty 100

## 2023-06-20 MED ORDER — ROPIVACAINE HCL 5 MG/ML IJ SOLN
INTRAMUSCULAR | Status: DC | PRN
  Administered 2023-06-20: 30 mL via PERINEURAL
  Administered 2023-06-20: 15 mL via PERINEURAL

## 2023-06-20 MED ORDER — LACTATED RINGERS IV SOLN
INTRAVENOUS | Status: DC
Start: 2023-06-20 — End: 2023-06-20

## 2023-06-20 MED ORDER — MIDAZOLAM HCL 2 MG/2ML IJ SOLN
INTRAMUSCULAR | Status: AC
  Filled 2023-06-20: qty 2

## 2023-06-20 MED ORDER — LIDOCAINE 2% (20 MG/ML) 5 ML SYRINGE
INTRAMUSCULAR | Status: DC | PRN
  Administered 2023-06-20: 40 mg via INTRAVENOUS

## 2023-06-20 MED ORDER — ONDANSETRON HCL 4 MG/2ML IJ SOLN
INTRAMUSCULAR | Status: DC | PRN
  Administered 2023-06-20: 4 mg via INTRAVENOUS

## 2023-06-20 MED ORDER — DEXMEDETOMIDINE HCL IN NACL 80 MCG/20ML IV SOLN
INTRAVENOUS | Status: DC | PRN
  Administered 2023-06-20: 8 ug via INTRAVENOUS

## 2023-06-20 MED ORDER — TRANEXAMIC ACID-NACL 1000-0.7 MG/100ML-% IV SOLN
1000.0000 mg | INTRAVENOUS | Status: AC
  Administered 2023-06-20: 1000 mg via INTRAVENOUS

## 2023-06-20 MED ORDER — FENTANYL CITRATE (PF) 100 MCG/2ML IJ SOLN: 50.0000 ug | Freq: Once | INTRAMUSCULAR | Status: DC

## 2023-06-20 MED ORDER — FENTANYL CITRATE (PF) 100 MCG/2ML IJ SOLN
INTRAMUSCULAR | Status: AC
  Filled 2023-06-20: qty 2

## 2023-06-20 MED ORDER — ONDANSETRON HCL 4 MG/2ML IJ SOLN: 4.0000 mg | Freq: Once | INTRAMUSCULAR | Status: DC | PRN

## 2023-06-20 MED ORDER — ONDANSETRON HCL 4 MG/2ML IJ SOLN
INTRAMUSCULAR | Status: AC
  Filled 2023-06-20: qty 2

## 2023-06-20 MED ORDER — GABAPENTIN 300 MG PO CAPS
ORAL_CAPSULE | ORAL | Status: AC
  Filled 2023-06-20: qty 1

## 2023-06-20 MED ORDER — CLONIDINE HCL (ANALGESIA) 100 MCG/ML EP SOLN
EPIDURAL | Status: DC | PRN
  Administered 2023-06-20: 100 ug
  Administered 2023-06-20: 50 ug

## 2023-06-20 MED ORDER — LIDOCAINE 2% (20 MG/ML) 5 ML SYRINGE
INTRAMUSCULAR | Status: AC
  Filled 2023-06-20: qty 5

## 2023-06-20 MED ORDER — ACETAMINOPHEN 500 MG PO TABS
ORAL_TABLET | ORAL | Status: AC
  Filled 2023-06-20: qty 2

## 2023-06-20 MED ORDER — CEFAZOLIN SODIUM-DEXTROSE 2-4 GM/100ML-% IV SOLN
2.0000 g | INTRAVENOUS | Status: AC
  Administered 2023-06-20: 2 g via INTRAVENOUS

## 2023-06-20 MED ORDER — ACETAMINOPHEN 500 MG PO TABS
1000.0000 mg | ORAL_TABLET | Freq: Once | ORAL | Status: AC
  Administered 2023-06-20: 1000 mg via ORAL

## 2023-06-20 MED ORDER — ACETAMINOPHEN 10 MG/ML IV SOLN: 1000.0000 mg | Freq: Once | INTRAVENOUS | Status: DC | PRN

## 2023-06-20 SURGICAL SUPPLY — 71 items
BANDAGE ESMARK 6X9 LF (GAUZE/BANDAGES/DRESSINGS) IMPLANT
BIT DRILL QC 2.0 SHORT EVOS SM (DRILL) IMPLANT
BIT DRILL QC 2.5MM SHRT EVO SM (DRILL) IMPLANT
BLADE SURG 15 STRL LF DISP TIS (BLADE) ×2 IMPLANT
BNDG COHESIVE 4X5 TAN STRL LF (GAUZE/BANDAGES/DRESSINGS) ×1 IMPLANT
BNDG ELASTIC 4INX 5YD STR LF (GAUZE/BANDAGES/DRESSINGS) ×1 IMPLANT
BNDG ELASTIC 6INX 5YD STR LF (GAUZE/BANDAGES/DRESSINGS) IMPLANT
BNDG ESMARK 6X9 LF (GAUZE/BANDAGES/DRESSINGS)
BOOT STEPPER DURA LG (SOFTGOODS) IMPLANT
BOOT STEPPER DURA MED (SOFTGOODS) IMPLANT
CHLORAPREP W/TINT 26 (MISCELLANEOUS) ×1 IMPLANT
COVER BACK TABLE 60X90IN (DRAPES) ×1 IMPLANT
CUFF TRNQT CYL 24X4X16.5-23 (TOURNIQUET CUFF) IMPLANT
CUFF TRNQT CYL 34X4.125X (TOURNIQUET CUFF) IMPLANT
DRAPE C-ARM 42X72 X-RAY (DRAPES) ×1 IMPLANT
DRAPE C-ARMOR (DRAPES) ×1 IMPLANT
DRAPE EXTREMITY T 121X128X90 (DISPOSABLE) ×1 IMPLANT
DRAPE IMP U-DRAPE 54X76 (DRAPES) ×1 IMPLANT
DRAPE U-SHAPE 47X51 STRL (DRAPES) ×1 IMPLANT
DRILL QC 2.0 SHORT EVOS SM (DRILL) ×1
DRILL QC 2.5MM SHORT EVOS SM (DRILL) ×1
ELECT REM PT RETURN 9FT ADLT (ELECTROSURGICAL) ×1
ELECTRODE REM PT RTRN 9FT ADLT (ELECTROSURGICAL) ×1 IMPLANT
GAUZE PAD ABD 8X10 STRL (GAUZE/BANDAGES/DRESSINGS) ×1 IMPLANT
GAUZE SPONGE 4X4 12PLY STRL (GAUZE/BANDAGES/DRESSINGS) ×1 IMPLANT
GAUZE XEROFORM 1X8 LF (GAUZE/BANDAGES/DRESSINGS) ×1 IMPLANT
GLOVE BIO SURGEON STRL SZ 6 (GLOVE) ×1 IMPLANT
GLOVE BIO SURGEON STRL SZ7.5 (GLOVE) ×1 IMPLANT
GLOVE BIOGEL PI IND STRL 6.5 (GLOVE) ×1 IMPLANT
GLOVE BIOGEL PI IND STRL 8 (GLOVE) ×1 IMPLANT
GLOVE SURG SYN 7.5 E (GLOVE) ×1
GLOVE SURG SYN 7.5 PF PI (GLOVE) ×1 IMPLANT
GOWN STRL REUS W/ TWL LRG LVL3 (GOWN DISPOSABLE) ×2 IMPLANT
GOWN STRL REUS W/ TWL XL LVL3 (GOWN DISPOSABLE) ×1 IMPLANT
NDL HYPO 22X1.5 SAFETY MO (MISCELLANEOUS) IMPLANT
NEEDLE HYPO 22X1.5 SAFETY MO (MISCELLANEOUS)
NS IRRIG 1000ML POUR BTL (IV SOLUTION) ×1 IMPLANT
PACK BASIN DAY SURGERY FS (CUSTOM PROCEDURE TRAY) ×1 IMPLANT
PAD CAST 4YDX4 CTTN HI CHSV (CAST SUPPLIES) IMPLANT
PADDING CAST ABS COTTON 4X4 ST (CAST SUPPLIES) ×2 IMPLANT
PADDING CAST COTTON 6X4 STRL (CAST SUPPLIES) IMPLANT
PENCIL SMOKE EVACUATOR (MISCELLANEOUS) ×1 IMPLANT
PLATE FIB EVOS 2.7/3.5 3H R59 (Plate) IMPLANT
SCREW CORT 2.7X12 EVOS (Screw) IMPLANT
SCREW CORT 2.7X15 T8 ST EVOS (Screw) IMPLANT
SCREW CORT 2.7X16 ST EVOS (Screw) IMPLANT
SCREW CORT 2.7X16 STAR T8 EVOS (Screw) IMPLANT
SCREW CORT 3.5X15 ST EVOS (Screw) IMPLANT
SCREW CORT 3.5X16 ST EVOS (Screw) IMPLANT
SCREW CORT EVOS ST 3.5X12 (Screw) IMPLANT
SCREW CORTEX 3.5X18 EVOS (Screw) IMPLANT
SCREW EVOS 2.7X18 LOCK T8 (Screw) IMPLANT
SLEEVE SCD COMPRESS KNEE MED (STOCKING) ×1 IMPLANT
SPIKE FLUID TRANSFER (MISCELLANEOUS) IMPLANT
SPLINT PLASTER CAST FAST 5X30 (CAST SUPPLIES) IMPLANT
SPONGE T-LAP 18X18 ~~LOC~~+RFID (SPONGE) ×1 IMPLANT
STOCKINETTE 6 STRL (DRAPES) ×1 IMPLANT
SUCTION TUBE FRAZIER 10FR DISP (SUCTIONS) ×1 IMPLANT
SUT ETHILON 3 0 PS 1 (SUTURE) ×1 IMPLANT
SUT MNCRL AB 3-0 PS2 27 (SUTURE) IMPLANT
SUT MON AB 2-0 CT1 36 (SUTURE) IMPLANT
SUT VIC AB 0 CT1 27XBRD ANBCTR (SUTURE) ×1 IMPLANT
SUT VIC AB 2-0 CT1 TAPERPNT 27 (SUTURE) IMPLANT
SUT VIC AB 2-0 PS2 27 (SUTURE) IMPLANT
SUT VIC AB 2-0 SH 27XBRD (SUTURE) IMPLANT
SYR BULB EAR ULCER 3OZ GRN STR (SYRINGE) ×1 IMPLANT
SYR CONTROL 10ML LL (SYRINGE) IMPLANT
TOWEL GREEN STERILE FF (TOWEL DISPOSABLE) ×2 IMPLANT
TUBE CONNECTING 20X1/4 (TUBING) ×1 IMPLANT
TUBE SUCTION HIGH CAP CLEAR NV (SUCTIONS) ×1 IMPLANT
UNDERPAD 30X36 HEAVY ABSORB (UNDERPADS AND DIAPERS) ×1 IMPLANT

## 2023-06-20 NOTE — H&P (Signed)
Chief Complaint: Right ankle fracture        History of Present Illness:      Sarah Hendricks is a 78 y.o. female presenting today for evaluation of a right ankle fracture.  She sustained this on 05/29/23 after having a fall in her kitchen at home.  This was due to a lightheaded spell and upon evaluation in the emergency department she was found to have dehydration and new onset a-fib.  She has since been started on Eliquis 10 mg daily for anticoagulation.  For her ankle, she has been in a CAM boot and using a walker for light weight-bearing on the right foot.  Takes Tylenol as needed for pain which is moderate today.  Denies any numbness or tingling.  Patient also has known history of bilateral knee osteoarthritis which has been aggravated after the fall, particularly in the right knee.     Surgical History:   None   PMH/PSH/Family History/Social History/Meds/Allergies:         Past Medical History:  Diagnosis Date   Allergy     Arthritis     Breast cancer (HCC) 1994    lumpectomy and XRT, no chemo   CAD (coronary artery disease) 2008    PTCA, in [place]   GERD (gastroesophageal reflux disease)     History of chicken pox     Hyperlipidemia     Hypertension     Myocardial infarction (HCC) 03/15/2007    stent placed   Vitamin D deficiency               Past Surgical History:  Procedure Laterality Date   BREAST BIOPSY Left 1994   BREAST LUMPECTOMY Left 1994   CATARACT EXTRACTION, BILATERAL       CORONARY ANGIOPLASTY WITH STENT PLACEMENT   2008   CRANIECTOMY FOR DEPRESSED SKULL FRACTURE   1986   TONSILLECTOMY   1966        Social History         Socioeconomic History   Marital status: Married      Spouse name: Not on file   Number of children: Not on file   Years of education: Not on file   Highest education level: Not on file  Occupational History   Not on file  Tobacco Use   Smoking status: Never   Smokeless tobacco: Never  Vaping Use    Vaping status: Never Used  Substance and Sexual Activity   Alcohol use: No   Drug use: No   Sexual activity: Not on file  Other Topics Concern   Not on file  Social History Narrative    Married, lives with spouse    Retired from [employer] irregularly, rides an exercise bike    Social Drivers of Health        Financial Resource Strain: Low Risk  (09/23/2022)    Overall Financial Resource Strain (CARDIA)     Difficulty of Paying Living Expenses: Not hard at all  Food Insecurity: No Food Insecurity (05/30/2023)    Hunger Vital Sign     Worried About Running Out of Food in the Last Year: Never true     Ran Out of Food in the Last Year: Never true  Transportation Needs: No Transportation Needs (05/30/2023)    PRAPARE - Therapist, art (Medical): No     Lack of Transportation (Non-Medical): No  Physical Activity:  Sufficiently Active (09/23/2022)    Exercise Vital Sign     Days of Exercise per Week: 5 days     Minutes of Exercise per Session: 30 min  Stress: No Stress Concern Present (09/23/2022)    [employer] of Occupational Health - Occupational Stress Questionnaire     Feeling of Stress : Not at all  Social Connections: Socially Integrated (05/30/2023)    Social Connection and Isolation Panel [NHANES]     Frequency of Communication with Friends and Family: More than three times a week     Frequency of Social Gatherings with Friends and Family: More than three times a week     Attends Religious Services: More than 4 times per year     Active Member of [employer] or Organizations: No     Attends Engineer, structural: More than 4 times per year     Marital Status: Married         Family History  Problem Relation Age of Onset   Breast cancer Mother     Hypertension Mother     Heart disease Mother     Stroke Mother     Arthritis Father     Heart disease Father          died at 43 of heart attack   Heart disease Brother      Stroke Maternal Grandmother     Heart disease Maternal Grandfather     Colon cancer Neg Hx     Esophageal cancer Neg Hx     Rectal cancer Neg Hx     Stomach cancer Neg Hx          Allergies       Allergies  Allergen Reactions   Acetaminophen-Codeine Other (See Comments)      Bad dreams   Tigan [Trimethobenzamide] Other (See Comments)      Bad dreams   Alendronate        Caused leg pains    Atorvastatin Other (See Comments)      Pt reports "causes bilateral leg pains."   Codeine        Bad dreams   Simvastatin Other (See Comments)      Leg cramps            Current Outpatient Medications  Medication Sig Dispense Refill   apixaban (ELIQUIS) 5 MG TABS tablet Take 1 tablet (5 mg total) by mouth 2 (two) times daily. 60 tablet 0   CALCIUM MAGNESIUM ZINC PO Take 1 tablet by mouth every other day.       Cholecalciferol (EQL VITAMIN D3) 1000 units tablet Take 1 tablet (1,000 Units total) by mouth daily.       escitalopram (LEXAPRO) 10 MG tablet TAKE 1 TABLET(10 MG) BY MOUTH DAILY (Patient taking differently: Take 10 mg by mouth daily as needed (Anxiety).) 90 tablet 1   losartan (COZAAR) 100 MG tablet TAKE 1 TABLET(100 MG) BY MOUTH DAILY 90 tablet 3   naloxone (NARCAN) 0.4 MG/ML injection Inject 1 mL (0.4 mg total) into the vein as needed. (Patient not taking: Reported on 06/07/2023) 1 mL 0   Omega-3 Fatty Acids (FISH OIL) 1200 MG CAPS Take 1 capsule by mouth daily.       polyethylene glycol (MIRALAX / GLYCOLAX) 17 g packet Take 17 g by mouth daily as needed for mild constipation. 14 each 0   propranolol (INDERAL) 80 MG tablet Take 1 tablet (80 mg total) by mouth 2 (two) times daily. 180  tablet 3   rosuvastatin (CRESTOR) 10 MG tablet Take 1 tablet (10 mg total) by mouth daily. 90 tablet 3      No current facility-administered medications for this visit.      Imaging Results (Last 48 hours)  No results found.     Review of Systems:   A ROS was performed including pertinent  positives and negatives as documented in the HPI.   Physical Exam :   Constitutional: NAD and appears stated age Neurological: Alert and oriented Psych: Appropriate affect and cooperative There were no vitals taken for this visit.    Comprehensive Musculoskeletal Exam:     Right ankle moderately swollen with ecchymosis extending into the right foot.  Focal swelling and tenderness over the lateral malleolus without visible deformity.  Calf is supple and nontender.  Dorsalis pedis pulse 2+.  Brisk capillary refill to all 5 toes.  Distal neurosensory exam is intact.   Imaging:   Xray from 05/29/2023 (right ankle 3 views): Comminuted fracture of the distal fibula fracture with lateral and posterior displacement.  Deformity of the inferior medial malleolus suggesting bimalleolar equivalent fracture     I personally reviewed and interpreted the radiographs.     Assessment:   78 y.o. female with a bimalleolar ankle fracture that occurred due to a fall over 2 weeks ago.  The distal fibula has a few millimeters of displacement and is at the level of the mortise therefore I discussed that surgical management is recommended given the resulting instability.  There does appear to be involvement of the medial malleolus anterior colliculus.  This is a very small fragment of bone.  Overall given the posterior displacement profile of the fibula and persistent pain with limited ability to bear weight I would recommend surgical fixation to allow for earlier mobilization.  I did discuss posterior lateral plating.  I did discuss that I would perform a stress examination and perform medial fixation as needed likely with sutures given the small fragment size.  I did discuss the risks limitations.  After discussions she is elected for surgical fixation   Plan :     - Plan for right ankle open reduction internal fixation   After a lengthy discussion of treatment options, including risks, benefits, alternatives,  complications of surgical and nonsurgical conservative options, the patient elected surgical repair.   The patient  is aware of the material risks  and complications including, but not limited to injury to adjacent structures, neurovascular injury, infection, numbness, bleeding, implant failure, thermal burns, stiffness, persistent pain, failure to heal, disease transmission from allograft, need for further surgery, dislocation, anesthetic risks, blood clots, risks of death,and others. The probabilities of surgical success and failure discussed with patient given their particular co-morbidities.The time and nature of expected rehabilitation and recovery was discussed.The patient's questions were all answered preoperatively.  No barriers to understanding were noted. I explained the natural history of the disease process and Rx rationale.  I explained to the patient what I considered to be reasonable expectations given their personal situation.  The final treatment plan was arrived at through a shared patient decision making process model.          I personally saw and evaluated the patient, and participated in the management and treatment plan.

## 2023-06-20 NOTE — Op Note (Signed)
Date of Surgery: 06/20/2023  INDICATIONS: Ms. Pierre is a 78 y.o.-year-old female with right displaced bimalleolar equivalent ankle fracture.  The risk and benefits of the procedure were discussed in detail and documented in the pre-operative evaluation.   PREOPERATIVE DIAGNOSIS: 1.  Right bimalleolar equivalent ankle fracture  POSTOPERATIVE DIAGNOSIS: Same.  PROCEDURE: 1.  Right distal fibular plating  SURGEON: Benancio Deeds MD  ASSISTANT: Ardeen Fillers, ATC  ANESTHESIA:  general  IV FLUIDS AND URINE: See anesthesia record.  ANTIBIOTICS: Ancef  ESTIMATED BLOOD LOSS: 25 mL.  IMPLANTS:  Implant Name Type Inv. Item Serial No. Manufacturer Lot No. LRB No. Used Action  SCREW CORT 2.7X16 ST EVOS - [id number] Screw SCREW CORT 2.7X16 ST EVOS  [employer]  Right 1 Implanted  PLATE FIB EVOS 0.9/8.1 3H R59 - [id number] Plate PLATE FIB EVOS 2.1/3.0 3H R59  [employer]  Right 1 Implanted  PLATE FIB EVOS 8.6/5.7 3H R59 - [id number] Plate PLATE FIB EVOS 4.1/3.2 3H R59  [employer]  Right 1 Implanted  SCREW CORT 3.5X15 ST EVOS - [id number] Screw SCREW CORT 3.5X15 ST EVOS  [employer]  Right 1 Implanted  SCREW CORT EVOS ST 3.5X12 - [id number] Screw SCREW CORT EVOS ST 3.5X12  [employer]  Right 1 Implanted  SCREW CORT 2.7X12 EVOS - [id number] Screw SCREW CORT 2.7X12 EVOS  [employer]  Right 1 Implanted  SCREW CORT 2.7X15 T8 ST EVOS - [id number] Screw SCREW CORT 2.7X15 T8 ST EVOS  [employer]  Right 1 Implanted  SCREW CORT 2.7X16 STAR T8 EVOS - [id number] Screw SCREW CORT 2.7X16 STAR T8 EVOS  [employer]  Right 1 Implanted  SCREW EVOS 2.7X18 LOCK T8 - [id number] Screw SCREW EVOS 2.7X18 LOCK T8  [employer]  Right 1 Implanted  SCREW CORT 3.5X16 ST EVOS - [id number] Screw SCREW CORT 3.5X16 ST EVOS  [employer]  Right 1 Implanted     DRAINS: None  CULTURES: None  COMPLICATIONS: none  DESCRIPTION OF PROCEDURE:   I identified the patient in the pre-operative holding area.  I marked the operative right ankle with my initials. I reviewed the risks and benefits of the proposed surgical intervention and the patient (and/or patient's guardian) wished to proceed.  Anesthesia was then performed with a regional block.  The patient was transferred to the operative suite and placed in the spine position with all bony prominences padded.     SCDs were placed on the non-operative lower extremity. Appropriate antibiotics was administered within 1 hour before incision. The operative extremity was then prepped and draped in standard fashion. A time out was performed confirming the correct extremity, correct patient and correct procedure.=  A lateral incision over the fibula was made centered around the fracture. The incision was taken sharply down to bone. Full thickness anterior and posterior flaps were made. The fracture site was cleared of debris. Reduction clamps were used to mobilize the fracture ends and hold the fracture in place. Fluoroscopy was used to confirm proper alignment.  Distal fibular locking plate was used due to the quality of bone.  This was positioned in the lateral position.  A screw was placed in the buttress position.  A distal nonlocking screw was placed again under fluoroscopy in order to guide the plate to bone.  AP and lateral fluoroscopy confirmed anatomic reduction with good placement of the plate.  Remain small nonlocking screws and 4 additional locking screws were then placed.  The wounds were thoroughly irrigated. The incisions were closed with vertical mattress 3-0 nylons. The incisions were dressed with xeroform, 4x4s, webril, and bias. A boot was placed.   POSTOPERATIVE PLAN: Bearing as tolerated on the right ankle.  She replaced on aspirin for blood clot prevention.  I will see her back in 2 weeks  for suture removal  Benancio Deeds, MD 2:40 PM  ;

## 2023-06-20 NOTE — Anesthesia Procedure Notes (Signed)
Anesthesia Regional Block: Adductor canal block   Pre-Anesthetic Checklist: , timeout performed,  Correct Patient, Correct Site, Correct Laterality,  Correct Procedure, Correct Position, site marked,  Risks and benefits discussed,  Surgical consent,  Pre-op evaluation,  At surgeon's request and post-op pain management  Laterality: Lower and Right  Prep: chloraprep       Needles:  Injection technique: Single-shot  Needle Type: Echogenic Needle     Needle Length: 9cm  Needle Gauge: 22     Additional Needles:   Procedures:,,,, ultrasound used (permanent image in chart),,    Narrative:  Start time: 06/20/2023 12:05 PM End time: 06/20/2023 12:08 PM Injection made incrementally with aspirations every 5 mL.  Performed by: Personally  Anesthesiologist: Trevor Iha, MD  Additional Notes: Block assessed prior to surgery. Pt tolerated procedure well.

## 2023-06-20 NOTE — Anesthesia Postprocedure Evaluation (Signed)
Anesthesia Post Note  Patient: Sarah Hendricks  Procedure(s) Performed: RIGHT OPEN REDUCTION INTERNAL FIXATION (ORIF) ANKLE FRACTURE (Right: Ankle)     Patient location during evaluation: PACU Anesthesia Type: MAC and Regional Level of consciousness: awake and alert Pain management: pain level controlled Vital Signs Assessment: post-procedure vital signs reviewed and stable Respiratory status: spontaneous breathing, nonlabored ventilation, respiratory function stable and patient connected to nasal cannula oxygen Cardiovascular status: stable and blood pressure returned to baseline Postop Assessment: no apparent nausea or vomiting Anesthetic complications: no   No notable events documented.  Last Vitals:  Vitals:   06/20/23 1500 06/20/23 1514  BP: 110/63 120/72  Pulse: 70 68  Resp: 13 16  Temp:  (!) 36.3 C  SpO2: 94% 94%    Last Pain:  Vitals:   06/20/23 1514  TempSrc:   PainSc: 0-No pain                 Trevor Iha

## 2023-06-20 NOTE — Anesthesia Procedure Notes (Signed)
Procedure Name: MAC Date/Time: 06/20/2023 1:39 PM  Performed by: Yolanda Bonine, CRNAPre-anesthesia Checklist: Patient identified, Emergency Drugs available, Suction available, Patient being monitored and Timeout performed Patient Re-evaluated:Patient Re-evaluated prior to induction Oxygen Delivery Method: Simple face mask

## 2023-06-20 NOTE — Progress Notes (Signed)
Assisted Dr. Houser with right, adductor canal, popliteal, ultrasound guided block. Side rails up, monitors on throughout procedure. See vital signs in flow sheet. Tolerated Procedure well. ?

## 2023-06-20 NOTE — Anesthesia Procedure Notes (Signed)
Anesthesia Regional Block: Popliteal block   Pre-Anesthetic Checklist: , timeout performed,  Correct Patient, Correct Site, Correct Laterality,  Correct Procedure, Correct Position, site marked,  Risks and benefits discussed,  Pre-op evaluation,  At surgeon's request and post-op pain management  Laterality: Lower and Right  Prep: Maximum Sterile Barrier Precautions used, chloraprep       Needles:  Injection technique: Single-shot  Needle Type: Echogenic Needle     Needle Length: 9cm  Needle Gauge: 21     Additional Needles:   Procedures:,,,, ultrasound used (permanent image in chart),,    Narrative:  Start time: 06/20/2023 11:58 AM End time: 06/20/2023 12:04 PM Injection made incrementally with aspirations every 5 mL.  Performed by: Personally  Anesthesiologist: Trevor Iha, MD  Additional Notes: Block assessed. Patient tolerated procedure well.

## 2023-06-20 NOTE — Brief Op Note (Signed)
   Brief Op Note  Date of Surgery: 06/20/2023  Preoperative Diagnosis: RIGHT ANKLE BIMALLEOLAR FRACTURE  Postoperative Diagnosis: same  Procedure: Procedure(s): RIGHT OPEN REDUCTION INTERNAL FIXATION (ORIF) ANKLE FRACTURE  Implants: Implant Name Type Inv. Item Serial No. Manufacturer Lot No. LRB No. Used Action  SCREW CORT 2.7X16 ST EVOS - [id number] Screw SCREW CORT 2.7X16 ST EVOS  [employer]  Right 1 Implanted  PLATE FIB EVOS 8.2/9.5 3H R59 - [id number] Plate PLATE FIB EVOS 4.6/9.6 3H R59  [employer]  Right 1 Implanted  PLATE FIB EVOS 2.9/5.2 3H R59 - [id number] Plate PLATE FIB EVOS 2.7/2.5 3H R59  [employer]  Right 1 Implanted  SCREW CORT 3.5X15 ST EVOS - [id number] Screw SCREW CORT 3.5X15 ST EVOS  [employer]  Right 1 Implanted  SCREW CORT EVOS ST 3.5X12 - [id number] Screw SCREW CORT EVOS ST 3.5X12  [employer]  Right 1 Implanted  SCREW CORT 2.7X12 EVOS - [id number] Screw SCREW CORT 2.7X12 EVOS  [employer]  Right 1 Implanted  SCREW CORT 2.7X15 T8 ST EVOS - [id number] Screw SCREW CORT 2.7X15 T8 ST EVOS  [employer]  Right 1 Implanted  SCREW CORT 2.7X16 STAR T8 EVOS - [id number] Screw SCREW CORT 2.7X16 STAR T8 EVOS  [employer]  Right 1 Implanted  SCREW EVOS 2.7X18 LOCK T8 - [id number] Screw SCREW EVOS 2.7X18 LOCK T8  [employer]  Right 1 Implanted  SCREW CORT 3.5X16 ST EVOS - [id number] Screw SCREW CORT 3.5X16 ST EVOS  [employer]  Right 1 Implanted    Surgeons: Surgeon(s): Huel Cote, MD  Anesthesia: Monitor Anesthesia Care    Estimated Blood Loss: See anesthesia record  Complications: None  Condition to PACU: Stable  Benancio Deeds, MD 06/20/2023 2:40 PM

## 2023-06-20 NOTE — Discharge Instructions (Addendum)
Discharge Instructions    Attending Surgeon: Huel Cote, MD Office Phone Number: [phone]   Diagnosis and Procedures:    Surgeries Performed: Right ankle fixation  Discharge Plan:    Diet: Resume usual diet. Begin with light or bland foods.  Drink plenty of fluids.  Activity:  Bearing as tolerated right ankle. You are advised to go home directly from the hospital or surgical center. Restrict your activities.  GENERAL INSTRUCTIONS: 1.  Please apply ice to your wound to help with swelling and inflammation. This will improve your comfort and your overall recovery following surgery.     2. Please call Dr. Serena Croissant office at [phone] with questions Monday-Friday during business hours. If no one answers, please leave a message and someone should get back to the patient within 24 hours. For emergencies please call 911 or proceed to the emergency room.   3. Patient to notify surgical team if experiences any of the following: Bowel/Bladder dysfunction, uncontrolled pain, nerve/muscle weakness, incision with increased drainage or redness, nausea/vomiting and Fever greater than 101.0 F.  Be alert for signs of infection including redness, streaking, odor, fever or chills. Be alert for excessive pain or bleeding and notify your surgeon immediately.  WOUND INSTRUCTIONS:   Leave your dressing, cast, or splint in place until your post operative visit.  Keep it clean and dry.  Always keep the incision clean and dry until the staples/sutures are removed. If there is no drainage from the incision you should keep it open to air. If there is drainage from the incision you must keep it covered at all times until the drainage stops  Do not soak in a bath tub, hot tub, pool, lake or other body of water until 21 days after your surgery and your incision is completely dry and healed.  If you have removable sutures (or staples) they must be removed 10-14 days (unless otherwise  instructed) from the day of your surgery.     1)  Elevate the extremity as much as possible.  2)  Keep the dressing clean and dry.  3)  Please call us if the dressing becomes wet or dirty.  4)  If you are experiencing worsening pain or worsening swelling, please call.     MEDICATIONS: Resume all previous home medications at the previous prescribed dose and frequency unless otherwise noted Start taking the  pain medications on an as-needed basis as prescribed  Please taper down pain medication over the next week following surgery.  Ideally you should not require a refill of any narcotic pain medication.  Take pain medication with food to minimize nausea. In addition to the prescribed pain medication, you may take over-the-counter pain relievers such as Tylenol.  Do NOT take additional tylenol if your pain medication already has tylenol in it.  Aspirin 325mg  daily per instructions on bottle. Narcotic policy: Per [facility] clinic policy, our goal is ensure optimal postoperative pain control with a multimodal pain management strategy. For all [facility] patients, our goal is to wean post-operative narcotic medications by 6 weeks post-operatively, and many times sooner. If this is not possible due to utilization of pain medication prior to surgery, your [facility] doctor will support your acute post-operative pain control for the first 6 weeks postoperatively, with a plan to transition you back to your primary pain team following that. Cyndia Skeeters will work to ensure a Therapist, occupational.       FOLLOWUP INSTRUCTIONS: 1. Follow up at the Physical Therapy Clinic 3-4  days following surgery. This appointment should be scheduled unless other arrangements have been made.The Physical Therapy scheduling number is [phone] if an appointment has not already been arranged.  2. Contact Dr. Serena Croissant office during office hours at [phone] or the practice after hours line at [phone] for non-emergencies.  For medical emergencies call 911.   Discharge Location: Home  No Tylenol until 5:30pm.

## 2023-06-20 NOTE — Transfer of Care (Signed)
Immediate Anesthesia Transfer of Care Note  Patient: Sarah Hendricks  Procedure(s) Performed: RIGHT OPEN REDUCTION INTERNAL FIXATION (ORIF) ANKLE FRACTURE (Right: Ankle)  Patient Location: PACU  Anesthesia Type:MAC combined with regional for post-op pain  Level of Consciousness: drowsy  Airway & Oxygen Therapy: Patient Spontanous Breathing and Patient connected to face mask oxygen  Post-op Assessment: Report given to RN and Post -op Vital signs reviewed and unstable, Anesthesiologist notified  Post vital signs: Reviewed and stable  Last Vitals:  Vitals Value Taken Time  BP 106/72 06/20/23 1445  Temp 36.3 C 06/20/23 1439  Pulse 69 06/20/23 1452  Resp 14 06/20/23 1452  SpO2 95 % 06/20/23 1452  Vitals shown include unfiled device data.  Last Pain:  Vitals:   06/20/23 1439  TempSrc:   PainSc: 0-No pain      Patients Stated Pain Goal: 2 (06/20/23 1122)  Complications: No notable events documented.

## 2023-06-22 ENCOUNTER — Encounter (HOSPITAL_BASED_OUTPATIENT_CLINIC_OR_DEPARTMENT_OTHER): Payer: Self-pay | Admitting: Orthopaedic Surgery

## 2023-06-22 ENCOUNTER — Telehealth: Payer: Self-pay | Admitting: Internal Medicine

## 2023-06-22 DIAGNOSIS — I480 Paroxysmal atrial fibrillation: Secondary | ICD-10-CM | POA: Diagnosis not present

## 2023-06-22 DIAGNOSIS — K219 Gastro-esophageal reflux disease without esophagitis: Secondary | ICD-10-CM | POA: Diagnosis not present

## 2023-06-22 DIAGNOSIS — F419 Anxiety disorder, unspecified: Secondary | ICD-10-CM | POA: Diagnosis not present

## 2023-06-22 DIAGNOSIS — I10 Essential (primary) hypertension: Secondary | ICD-10-CM | POA: Diagnosis not present

## 2023-06-22 DIAGNOSIS — I2510 Atherosclerotic heart disease of native coronary artery without angina pectoris: Secondary | ICD-10-CM | POA: Diagnosis not present

## 2023-06-22 DIAGNOSIS — F32A Depression, unspecified: Secondary | ICD-10-CM | POA: Diagnosis not present

## 2023-06-22 DIAGNOSIS — E876 Hypokalemia: Secondary | ICD-10-CM | POA: Diagnosis not present

## 2023-06-22 DIAGNOSIS — E871 Hypo-osmolality and hyponatremia: Secondary | ICD-10-CM | POA: Diagnosis not present

## 2023-06-22 DIAGNOSIS — Z791 Long term (current) use of non-steroidal anti-inflammatories (NSAID): Secondary | ICD-10-CM | POA: Diagnosis not present

## 2023-06-22 DIAGNOSIS — M1990 Unspecified osteoarthritis, unspecified site: Secondary | ICD-10-CM | POA: Diagnosis not present

## 2023-06-22 DIAGNOSIS — E559 Vitamin D deficiency, unspecified: Secondary | ICD-10-CM | POA: Diagnosis not present

## 2023-06-22 DIAGNOSIS — S82891D Other fracture of right lower leg, subsequent encounter for closed fracture with routine healing: Secondary | ICD-10-CM | POA: Diagnosis not present

## 2023-06-22 DIAGNOSIS — I252 Old myocardial infarction: Secondary | ICD-10-CM | POA: Diagnosis not present

## 2023-06-22 DIAGNOSIS — R7303 Prediabetes: Secondary | ICD-10-CM | POA: Diagnosis not present

## 2023-06-22 DIAGNOSIS — M810 Age-related osteoporosis without current pathological fracture: Secondary | ICD-10-CM | POA: Diagnosis not present

## 2023-06-22 DIAGNOSIS — E785 Hyperlipidemia, unspecified: Secondary | ICD-10-CM | POA: Diagnosis not present

## 2023-06-22 NOTE — Telephone Encounter (Signed)
 Okay for orders?

## 2023-06-22 NOTE — Telephone Encounter (Signed)
 Copied from CRM [id number]. Topic: Clinical - Home Health Verbal Orders >> Jun 22, 2023  9:47 AM Dimple Francis wrote: Reason for CRM: PT- Loyce Ruffini from [facility] [phone]- Wants to continue 1x a week for 2 weeks. Please reach out

## 2023-06-22 NOTE — Telephone Encounter (Signed)
 Verbals given today.

## 2023-06-23 ENCOUNTER — Other Ambulatory Visit (HOSPITAL_COMMUNITY): Payer: Self-pay

## 2023-06-25 ENCOUNTER — Encounter: Payer: Self-pay | Admitting: Internal Medicine

## 2023-06-28 DIAGNOSIS — I480 Paroxysmal atrial fibrillation: Secondary | ICD-10-CM | POA: Diagnosis not present

## 2023-06-28 DIAGNOSIS — Z791 Long term (current) use of non-steroidal anti-inflammatories (NSAID): Secondary | ICD-10-CM | POA: Diagnosis not present

## 2023-06-28 DIAGNOSIS — K219 Gastro-esophageal reflux disease without esophagitis: Secondary | ICD-10-CM | POA: Diagnosis not present

## 2023-06-28 DIAGNOSIS — E785 Hyperlipidemia, unspecified: Secondary | ICD-10-CM | POA: Diagnosis not present

## 2023-06-28 DIAGNOSIS — E559 Vitamin D deficiency, unspecified: Secondary | ICD-10-CM | POA: Diagnosis not present

## 2023-06-28 DIAGNOSIS — R7303 Prediabetes: Secondary | ICD-10-CM | POA: Diagnosis not present

## 2023-06-28 DIAGNOSIS — M1990 Unspecified osteoarthritis, unspecified site: Secondary | ICD-10-CM | POA: Diagnosis not present

## 2023-06-28 DIAGNOSIS — M810 Age-related osteoporosis without current pathological fracture: Secondary | ICD-10-CM | POA: Diagnosis not present

## 2023-06-28 DIAGNOSIS — F32A Depression, unspecified: Secondary | ICD-10-CM | POA: Diagnosis not present

## 2023-06-28 DIAGNOSIS — F419 Anxiety disorder, unspecified: Secondary | ICD-10-CM | POA: Diagnosis not present

## 2023-06-28 DIAGNOSIS — I2510 Atherosclerotic heart disease of native coronary artery without angina pectoris: Secondary | ICD-10-CM | POA: Diagnosis not present

## 2023-06-28 DIAGNOSIS — I252 Old myocardial infarction: Secondary | ICD-10-CM | POA: Diagnosis not present

## 2023-06-28 DIAGNOSIS — E876 Hypokalemia: Secondary | ICD-10-CM | POA: Diagnosis not present

## 2023-06-28 DIAGNOSIS — I10 Essential (primary) hypertension: Secondary | ICD-10-CM | POA: Diagnosis not present

## 2023-06-28 DIAGNOSIS — S82891D Other fracture of right lower leg, subsequent encounter for closed fracture with routine healing: Secondary | ICD-10-CM | POA: Diagnosis not present

## 2023-06-28 DIAGNOSIS — E871 Hypo-osmolality and hyponatremia: Secondary | ICD-10-CM | POA: Diagnosis not present

## 2023-07-01 ENCOUNTER — Other Ambulatory Visit: Payer: Self-pay

## 2023-07-01 ENCOUNTER — Telehealth: Payer: Self-pay | Admitting: Cardiology

## 2023-07-01 ENCOUNTER — Ambulatory Visit (HOSPITAL_BASED_OUTPATIENT_CLINIC_OR_DEPARTMENT_OTHER): Payer: Medicare Other | Admitting: Orthopaedic Surgery

## 2023-07-01 ENCOUNTER — Encounter (HOSPITAL_BASED_OUTPATIENT_CLINIC_OR_DEPARTMENT_OTHER): Payer: Self-pay

## 2023-07-01 ENCOUNTER — Ambulatory Visit (HOSPITAL_BASED_OUTPATIENT_CLINIC_OR_DEPARTMENT_OTHER): Payer: Medicare Other

## 2023-07-01 ENCOUNTER — Encounter (HOSPITAL_BASED_OUTPATIENT_CLINIC_OR_DEPARTMENT_OTHER): Payer: Self-pay | Admitting: Orthopaedic Surgery

## 2023-07-01 DIAGNOSIS — S82841A Displaced bimalleolar fracture of right lower leg, initial encounter for closed fracture: Secondary | ICD-10-CM | POA: Diagnosis not present

## 2023-07-01 DIAGNOSIS — M7989 Other specified soft tissue disorders: Secondary | ICD-10-CM | POA: Diagnosis not present

## 2023-07-01 DIAGNOSIS — Z4789 Encounter for other orthopedic aftercare: Secondary | ICD-10-CM | POA: Diagnosis not present

## 2023-07-01 MED ORDER — APIXABAN 5 MG PO TABS: 5.0000 mg | ORAL_TABLET | Freq: Two times a day (BID) | ORAL | 1 refills | Status: DC

## 2023-07-01 NOTE — Telephone Encounter (Signed)
Prescription refill request for Eliquis received. Indication:AFIB Last office visit:1/25 Scr:0.86  1/25 Age: 78 Weight:68  kg  Prescription refilled

## 2023-07-01 NOTE — Telephone Encounter (Signed)
*  STAT* If patient is at the pharmacy, call can be transferred to refill team.   1. Which medications need to be refilled? (please list name of each medication and dose if known)  apixaban (ELIQUIS) 5 MG TABS tablet  2. Which pharmacy/location (including street and city if local pharmacy) is medication to be sent to? [id number] [place], [place] - [address] AT [facility] [address] [facility] [address] Phone: [phone]  Fax: [phone]     3. Do they need a 30 day or 90 day supply? 90 Pt is out  of medication

## 2023-07-01 NOTE — Progress Notes (Signed)
Post Operative Evaluation    Procedure/Date of Surgery: Right ankle open reduction internal fixation 2/3  Interval History:   Presents today 2 weeks status post right ankle open duction internal fixation overall doing well.  She has been able to bear some weight on the ankle which is improving.  Denies any numbness or loss of sensation in the right foot.  She is working with physical therapy   PMH/PSH/Family History/Social History/Meds/Allergies:    Past Medical History:  Diagnosis Date   Allergy    Anxiety    Arthritis    Bimalleolar ankle fracture, right, closed, initial encounter    Breast cancer (HCC) 1994   lumpectomy and XRT, no chemo   CAD (coronary artery disease) 2008   PTCA, in [place]   GERD (gastroesophageal reflux disease)    History of chicken pox    Hyperlipidemia    Hypertension    Myocardial infarction (HCC) 03/15/2007   stent placed   Vitamin D deficiency    Past Surgical History:  Procedure Laterality Date   BREAST BIOPSY Left 1994   BREAST LUMPECTOMY Left 1994   CATARACT EXTRACTION, BILATERAL     CORONARY ANGIOPLASTY WITH STENT PLACEMENT  2008   CRANIECTOMY FOR DEPRESSED SKULL FRACTURE  1986   ORIF ANKLE FRACTURE Right 06/20/2023   Procedure: RIGHT OPEN REDUCTION INTERNAL FIXATION (ORIF) ANKLE FRACTURE;  Surgeon: Huel Cote, MD;  Location: [facility];  Service: Orthopedics;  Laterality: Right;   TONSILLECTOMY  1966   Social History   Socioeconomic History   Marital status: Married    Spouse name: Not on file   Number of children: Not on file   Years of education: Not on file   Highest education level: Not on file  Occupational History   Not on file  Tobacco Use   Smoking status: Never   Smokeless tobacco: Never  Vaping Use   Vaping status: Never Used  Substance and Sexual Activity   Alcohol use: No   Drug use: No   Sexual activity: Not Currently    Birth control/protection:  Post-menopausal  Other Topics Concern   Not on file  Social History Narrative   Married, lives with spouse   Retired from [employer] irregularly, rides an exercise bike   Social Drivers of Health   Financial Resource Strain: Low Risk  (09/23/2022)   Overall Financial Resource Strain (CARDIA)    Difficulty of Paying Living Expenses: Not hard at all  Food Insecurity: No Food Insecurity (05/30/2023)   Hunger Vital Sign    Worried About Running Out of Food in the Last Year: Never true    Ran Out of Food in the Last Year: Never true  Transportation Needs: No Transportation Needs (05/30/2023)   PRAPARE - Administrator, Civil Service (Medical): No    Lack of Transportation (Non-Medical): No  Physical Activity: Sufficiently Active (09/23/2022)   Exercise Vital Sign    Days of Exercise per Week: 5 days    Minutes of Exercise per Session: 30 min  Stress: No Stress Concern Present (09/23/2022)   [employer] of Occupational Health - Occupational Stress Questionnaire    Feeling of Stress : Not at all  Social Connections: Socially Integrated (05/30/2023)   Social Connection and Isolation Panel [NHANES]  Frequency of Communication with Friends and Family: More than three times a week    Frequency of Social Gatherings with Friends and Family: More than three times a week    Attends Religious Services: More than 4 times per year    Active Member of [employer] or Organizations: No    Attends Engineer, structural: More than 4 times per year    Marital Status: Married   Family History  Problem Relation Age of Onset   Breast cancer Mother    Hypertension Mother    Heart disease Mother    Stroke Mother    Arthritis Father    Heart disease Father        died at 26 of heart attack   Heart disease Brother    Stroke Maternal Grandmother    Heart disease Maternal Grandfather    Colon cancer Neg Hx    Esophageal cancer Neg Hx    Rectal cancer Neg Hx    Stomach  cancer Neg Hx    Allergies  Allergen Reactions   Acetaminophen-Codeine Other (See Comments)    Bad dreams   Tigan [Trimethobenzamide] Other (See Comments)    Bad dreams   Alendronate     Caused leg pains    Atorvastatin Other (See Comments)    Pt reports "causes bilateral leg pains."   Codeine     Bad dreams   Simvastatin Other (See Comments)    Leg cramps   Current Outpatient Medications  Medication Sig Dispense Refill   apixaban (ELIQUIS) 5 MG TABS tablet Take 1 tablet (5 mg total) by mouth 2 (two) times daily. 180 tablet 1   CALCIUM MAGNESIUM ZINC PO Take 1 tablet by mouth every other day.     Cholecalciferol (EQL VITAMIN D3) 1000 units tablet Take 1 tablet (1,000 Units total) by mouth daily.     escitalopram (LEXAPRO) 10 MG tablet TAKE 1 TABLET(10 MG) BY MOUTH DAILY 90 tablet 1   losartan (COZAAR) 100 MG tablet TAKE 1 TABLET(100 MG) BY MOUTH DAILY 90 tablet 3   Omega-3 Fatty Acids (FISH OIL) 1200 MG CAPS Take 1 capsule by mouth daily.     polyethylene glycol (MIRALAX / GLYCOLAX) 17 g packet Take 17 g by mouth daily as needed for mild constipation. 14 each 0   propranolol (INDERAL) 80 MG tablet Take 1 tablet (80 mg total) by mouth 2 (two) times daily. 180 tablet 3   rosuvastatin (CRESTOR) 10 MG tablet Take 1 tablet (10 mg total) by mouth daily. 90 tablet 3   No current facility-administered medications for this visit.   No results found.  Review of Systems:   A ROS was performed including pertinent positives and negatives as documented in the HPI.   Musculoskeletal Exam:    There were no vitals taken for this visit.  Right ankle incision is well-appearing without erythema or drainage.  She has 10 degrees dorsi and plantarflexion of the right foot.  Sensation is intact all distributions of the right foot with 2+ dorsalis pedis pulse  Imaging:    3 views right ankle: Status post fibular fixation without evidence complication  I personally reviewed and interpreted the  radiographs.   Assessment:   2 weeks status post right ankle fixation overall doing very well.  At this time she will continue to advance her weightbearing.  I did discuss that she may discontinue the boot she feels comfortable.  Plan to see her back in 4 weeks for reassessment  Plan :    -  Return to clinic 4 weeks for reassessment      I personally saw and evaluated the patient, and participated in the management and treatment plan.  Huel Cote, MD Attending Physician, Orthopedic Surgery  This document was dictated using Dragon voice recognition software. A reasonable attempt at proof reading has been made to minimize errors.

## 2023-07-03 ENCOUNTER — Emergency Department (HOSPITAL_BASED_OUTPATIENT_CLINIC_OR_DEPARTMENT_OTHER): Payer: Medicare Other

## 2023-07-03 ENCOUNTER — Emergency Department (HOSPITAL_COMMUNITY): Payer: Medicare Other

## 2023-07-03 ENCOUNTER — Other Ambulatory Visit: Payer: Self-pay

## 2023-07-03 ENCOUNTER — Encounter (HOSPITAL_COMMUNITY): Payer: Self-pay

## 2023-07-03 ENCOUNTER — Emergency Department (HOSPITAL_COMMUNITY)
Admission: EM | Admit: 2023-07-03 | Discharge: 2023-07-03 | Disposition: A | Discharge status: Home / Self Care | Payer: Medicare Other

## 2023-07-03 ENCOUNTER — Ambulatory Visit (HOSPITAL_COMMUNITY)
Admission: EM | Admit: 2023-07-03 | Discharge: 2023-07-03 | Disposition: A | Discharge status: Home / Self Care | Payer: Medicare Other | Attending: Emergency Medicine | Admitting: Emergency Medicine

## 2023-07-03 DIAGNOSIS — M1990 Unspecified osteoarthritis, unspecified site: Secondary | ICD-10-CM | POA: Diagnosis not present

## 2023-07-03 DIAGNOSIS — I252 Old myocardial infarction: Secondary | ICD-10-CM | POA: Diagnosis not present

## 2023-07-03 DIAGNOSIS — Z791 Long term (current) use of non-steroidal anti-inflammatories (NSAID): Secondary | ICD-10-CM | POA: Diagnosis not present

## 2023-07-03 DIAGNOSIS — Z79899 Other long term (current) drug therapy: Secondary | ICD-10-CM | POA: Insufficient documentation

## 2023-07-03 DIAGNOSIS — K219 Gastro-esophageal reflux disease without esophagitis: Secondary | ICD-10-CM | POA: Diagnosis not present

## 2023-07-03 DIAGNOSIS — I517 Cardiomegaly: Secondary | ICD-10-CM | POA: Diagnosis not present

## 2023-07-03 DIAGNOSIS — J90 Pleural effusion, not elsewhere classified: Secondary | ICD-10-CM | POA: Diagnosis not present

## 2023-07-03 DIAGNOSIS — I10 Essential (primary) hypertension: Secondary | ICD-10-CM | POA: Diagnosis not present

## 2023-07-03 DIAGNOSIS — R0602 Shortness of breath: Secondary | ICD-10-CM

## 2023-07-03 DIAGNOSIS — Z9181 History of falling: Secondary | ICD-10-CM | POA: Diagnosis not present

## 2023-07-03 DIAGNOSIS — Z853 Personal history of malignant neoplasm of breast: Secondary | ICD-10-CM | POA: Diagnosis not present

## 2023-07-03 DIAGNOSIS — E876 Hypokalemia: Secondary | ICD-10-CM | POA: Diagnosis not present

## 2023-07-03 DIAGNOSIS — R600 Localized edema: Secondary | ICD-10-CM | POA: Insufficient documentation

## 2023-07-03 DIAGNOSIS — M7989 Other specified soft tissue disorders: Secondary | ICD-10-CM

## 2023-07-03 DIAGNOSIS — R7303 Prediabetes: Secondary | ICD-10-CM | POA: Diagnosis not present

## 2023-07-03 DIAGNOSIS — E785 Hyperlipidemia, unspecified: Secondary | ICD-10-CM | POA: Diagnosis not present

## 2023-07-03 DIAGNOSIS — I2510 Atherosclerotic heart disease of native coronary artery without angina pectoris: Secondary | ICD-10-CM | POA: Insufficient documentation

## 2023-07-03 DIAGNOSIS — Z7901 Long term (current) use of anticoagulants: Secondary | ICD-10-CM | POA: Diagnosis not present

## 2023-07-03 DIAGNOSIS — Z955 Presence of coronary angioplasty implant and graft: Secondary | ICD-10-CM | POA: Diagnosis not present

## 2023-07-03 DIAGNOSIS — R918 Other nonspecific abnormal finding of lung field: Secondary | ICD-10-CM | POA: Diagnosis not present

## 2023-07-03 DIAGNOSIS — F32A Depression, unspecified: Secondary | ICD-10-CM | POA: Diagnosis not present

## 2023-07-03 DIAGNOSIS — E871 Hypo-osmolality and hyponatremia: Secondary | ICD-10-CM | POA: Diagnosis not present

## 2023-07-03 DIAGNOSIS — F419 Anxiety disorder, unspecified: Secondary | ICD-10-CM | POA: Diagnosis not present

## 2023-07-03 DIAGNOSIS — S82891D Other fracture of right lower leg, subsequent encounter for closed fracture with routine healing: Secondary | ICD-10-CM | POA: Diagnosis not present

## 2023-07-03 DIAGNOSIS — E559 Vitamin D deficiency, unspecified: Secondary | ICD-10-CM | POA: Diagnosis not present

## 2023-07-03 DIAGNOSIS — I480 Paroxysmal atrial fibrillation: Secondary | ICD-10-CM | POA: Diagnosis not present

## 2023-07-03 DIAGNOSIS — M810 Age-related osteoporosis without current pathological fracture: Secondary | ICD-10-CM | POA: Diagnosis not present

## 2023-07-03 LAB — D-DIMER, QUANTITATIVE: D-Dimer, Quant: 1.28 ug{FEU}/mL — ABNORMAL HIGH (ref 0.00–0.50)

## 2023-07-03 LAB — COMPREHENSIVE METABOLIC PANEL
ALT: 16 U/L (ref 0–44)
AST: 22 U/L (ref 15–41)
Albumin: 3.2 g/dL — ABNORMAL LOW (ref 3.5–5.0)
Alkaline Phosphatase: 69 U/L (ref 38–126)
Anion gap: 8 (ref 5–15)
BUN: 14 mg/dL (ref 8–23)
CO2: 25 mmol/L (ref 22–32)
Calcium: 8.8 mg/dL — ABNORMAL LOW (ref 8.9–10.3)
Chloride: 101 mmol/L (ref 98–111)
Creatinine, Ser: 1.15 mg/dL — ABNORMAL HIGH (ref 0.44–1.00)
GFR, Estimated: 49 mL/min — ABNORMAL LOW (ref >60)
Glucose, Bld: 126 mg/dL — ABNORMAL HIGH (ref 70–99)
Potassium: 3.7 mmol/L (ref 3.5–5.1)
Sodium: 134 mmol/L — ABNORMAL LOW (ref 135–145)
Total Bilirubin: 0.6 mg/dL (ref 0.0–1.2)
Total Protein: 6 g/dL — ABNORMAL LOW (ref 6.5–8.1)

## 2023-07-03 LAB — CBC
HCT: 31.5 % — ABNORMAL LOW (ref 36.0–46.0)
Hemoglobin: 10 g/dL — ABNORMAL LOW (ref 12.0–15.0)
MCH: 28.8 pg (ref 26.0–34.0)
MCHC: 31.7 g/dL (ref 30.0–36.0)
MCV: 90.8 fL (ref 80.0–100.0)
Platelets: 342 10*3/uL (ref 150–400)
RBC: 3.47 MIL/uL — ABNORMAL LOW (ref 3.87–5.11)
RDW: 15.7 % — ABNORMAL HIGH (ref 11.5–15.5)
WBC: 7 10*3/uL (ref 4.0–10.5)
nRBC: 0 % (ref 0.0–0.2)

## 2023-07-03 LAB — RESP PANEL BY RT-PCR (RSV, FLU A&B, COVID)  RVPGX2
Influenza A by PCR: NEGATIVE
Influenza B by PCR: NEGATIVE
Resp Syncytial Virus by PCR: NEGATIVE
SARS Coronavirus 2 by RT PCR: NEGATIVE

## 2023-07-03 LAB — TROPONIN I (HIGH SENSITIVITY)
Troponin I (High Sensitivity): 9 ng/L (ref <18)
Troponin I (High Sensitivity): 9 ng/L (ref <18)

## 2023-07-03 LAB — BRAIN NATRIURETIC PEPTIDE: B Natriuretic Peptide: 386.6 pg/mL — ABNORMAL HIGH (ref 0.0–100.0)

## 2023-07-03 MED ORDER — FUROSEMIDE 10 MG/ML IJ SOLN
40.0000 mg | Freq: Once | INTRAMUSCULAR | Status: AC
  Administered 2023-07-03: 40 mg via INTRAVENOUS
  Filled 2023-07-03: qty 4

## 2023-07-03 MED ORDER — IOHEXOL 350 MG/ML SOLN
75.0000 mL | Freq: Once | INTRAVENOUS | Status: AC | PRN
  Administered 2023-07-03: 75 mL via INTRAVENOUS

## 2023-07-03 MED ORDER — FUROSEMIDE 20 MG PO TABS: 20.0000 mg | ORAL_TABLET | Freq: Every day | ORAL | 0 refills | Status: DC

## 2023-07-03 NOTE — ED Notes (Signed)
Patient is being discharged from the Urgent Care and sent to the Emergency Department via personal opperated vehicle . Per Wynonia Lawman NP, patient is in need of higher level of care due to Abnormal EKG and SOB with history of Afib. Patient is aware and verbalizes understanding of plan of care.  Vitals:   07/03/23 1434  BP: (!) 142/84  Pulse: 78  Resp: 16  Temp: 97.6 F (36.4 C)  SpO2: 97%

## 2023-07-03 NOTE — Discharge Instructions (Signed)
Your leg swelling is due to fluid overload.  Please take Lasix as prescribed.  Wear compressive stocking in your legs to help with your swelling.  Follow-up closely with your cardiologist for further evaluation of your condition.  You would likely benefit from an ultrasound of your heart to assess for potential heart failure.  Return if you have any concern.

## 2023-07-03 NOTE — ED Triage Notes (Signed)
Pt c/o SOBx3d. Pt c/o left ankle edemax4d. Pt has 2+ edema of left ankle. Pt able to speak in complete sentences.

## 2023-07-03 NOTE — ED Provider Notes (Signed)
[place] EMERGENCY DEPARTMENT AT [facility] Provider Note   CSN: [id number] Arrival date & time: 07/03/23  1509     History  Chief Complaint  Patient presents with   Shortness of Breath   Leg Swelling    Sarah Hendricks is a 78 y.o. female.  The history is provided by the patient and medical records. No language interpreter was used.  Shortness of Breath    78 year old female history of breast cancer recent history of ankle fracture requiring surgical treatment, hypertension, hyperlipidemia, anxiety, CAD presenting with complaints of shortness of breath and ankle swelling.  Patient reports last month she broke her right ankle for which she was hospitalized and had her ankle surgically repaired.  During her hospitalization she was found to have new onset atrial fibrillation and she has been taking Eliquis.  Her right ankle is doing well and is improving but for the past 3 to 4 days she noticed swelling to both ankles, having increased shortness of breath worse at nighttime when she lays down and feeling fatigued.  She does not endorse fever, productive cough, hemoptysis, pleuritic chest pain, abdominal pain, or calf tenderness.  No prior history of PE or DVT.   Home Medications Prior to Admission medications   Medication Sig Start Date End Date Taking? Authorizing Provider  apixaban (ELIQUIS) 5 MG TABS tablet Take 1 tablet (5 mg total) by mouth 2 (two) times daily. 07/01/23   Jodelle Red, MD  Cholecalciferol (EQL VITAMIN D3) 1000 units tablet Take 1 tablet (1,000 Units total) by mouth daily. 06/04/15   Pincus Sanes, MD  escitalopram (LEXAPRO) 10 MG tablet TAKE 1 TABLET(10 MG) BY MOUTH DAILY 03/25/23   Pincus Sanes, MD  losartan (COZAAR) 100 MG tablet TAKE 1 TABLET(100 MG) BY MOUTH DAILY 11/26/22   Meriam Sprague, MD  propranolol (INDERAL) 80 MG tablet Take 1 tablet (80 mg total) by mouth 2 (two) times daily. 11/26/22   Meriam Sprague, MD   rosuvastatin (CRESTOR) 10 MG tablet Take 1 tablet (10 mg total) by mouth daily. 11/26/22   Meriam Sprague, MD      Allergies    Acetaminophen-codeine, Tigan [trimethobenzamide], Alendronate, Atorvastatin, Codeine, and Simvastatin    Review of Systems   Review of Systems  Respiratory:  Positive for shortness of breath.   All other systems reviewed and are negative.   Physical Exam Updated Vital Signs BP (!) 168/84   Pulse 84   Temp 98.1 F (36.7 C) (Oral)   Resp 18   Ht 5' 2.5" (1.588 m)   Wt 65.8 kg   SpO2 96%   BMI 26.11 kg/m  Physical Exam Vitals and nursing note reviewed.  Constitutional:      General: She is not in acute distress.    Appearance: She is well-developed.  HENT:     Head: Atraumatic.  Eyes:     Conjunctiva/sclera: Conjunctivae normal.  Neck:     Comments: No JVD Cardiovascular:     Rate and Rhythm: Rhythm irregular.  Pulmonary:     Effort: Pulmonary effort is normal.     Breath sounds: No wheezing, rhonchi or rales.  Abdominal:     Palpations: Abdomen is soft.     Tenderness: There is no abdominal tenderness.  Musculoskeletal:     Cervical back: Neck supple.     Right lower leg: Edema present.     Left lower leg: Edema present.  Skin:    Findings: No rash.  Neurological:  Mental Status: She is alert. Mental status is at baseline.  Psychiatric:        Mood and Affect: Mood normal.     ED Results / Procedures / Treatments   Labs (all labs ordered are listed, but only abnormal results are displayed) Labs Reviewed  COMPREHENSIVE METABOLIC PANEL - Abnormal; Notable for the following components:      Result Value   Sodium 134 (*)    Glucose, Bld 126 (*)    Creatinine, Ser 1.15 (*)    Calcium 8.8 (*)    Total Protein 6.0 (*)    Albumin 3.2 (*)    GFR, Estimated 49 (*)    All other components within normal limits  CBC - Abnormal; Notable for the following components:   RBC 3.47 (*)    Hemoglobin 10.0 (*)    HCT 31.5 (*)     RDW 15.7 (*)    All other components within normal limits  D-DIMER, QUANTITATIVE - Abnormal; Notable for the following components:   D-Dimer, Quant 1.28 (*)    All other components within normal limits  BRAIN NATRIURETIC PEPTIDE - Abnormal; Notable for the following components:   B Natriuretic Peptide 386.6 (*)    All other components within normal limits  RESP PANEL BY RT-PCR (RSV, FLU A&B, COVID)  [id number]  TROPONIN I (HIGH SENSITIVITY)  TROPONIN I (HIGH SENSITIVITY)    EKG None  Radiology VAS [place] LOWER EXTREMITY VENOUS (DVT) (7a-7p) Result Date: 07/03/2023  Lower Venous DVT Study Patient Name:  Sarah Hendricks  Date of Exam:   07/03/2023 Medical Rec #: [id number]         Accession #:    [id number] Date of Birth: Oct 13, 1945         Patient Gender: F Patient Age:   66 years Exam Location:  [facility] Procedure:      VAS [place] LOWER EXTREMITY VENOUS (DVT) Referring Phys: Fayrene Helper --------------------------------------------------------------------------------  Indications: Swelling. Other Indications: Recent fracture of of right ankle, status post ORIF 06/20/2023. Risk Factors: Atrial ffibrillation. Hst ory of venous reflux disease Anticoagulation: Eliquis. Comparison Study: Prior negative bilateral lower extremity venous done 05/01/20 Performing Technologist: Sherren Kerns RVS  Examination Guidelines: A complete evaluation includes B-mode imaging, spectral Doppler, color Doppler, and power Doppler as needed of all accessible portions of each vessel. Bilateral testing is considered an integral part of a complete examination. Limited examinations for reoccurring indications may be performed as noted. The reflux portion of the exam is performed with the patient in reverse Trendelenburg.  +--------+---------------+---------+-----------+----------------+-------------+ RIGHT   CompressibilityPhasicitySpontaneityProperties      Thrombus                                                                  Aging         +--------+---------------+---------+-----------+----------------+-------------+ CFV     Full           Yes      No         pulsatile  waveforms                     +--------+---------------+---------+-----------+----------------+-------------+ SFJ     Full                                                             +--------+---------------+---------+-----------+----------------+-------------+ FV Prox Full                                                             +--------+---------------+---------+-----------+----------------+-------------+ FV Mid  Full                                                             +--------+---------------+---------+-----------+----------------+-------------+ FV      Full                                                             Distal                                                                   +--------+---------------+---------+-----------+----------------+-------------+ PFV     Full                                                             +--------+---------------+---------+-----------+----------------+-------------+ POP     Full           Yes      No         pulsatile                                                                waveforms                     +--------+---------------+---------+-----------+----------------+-------------+ PTV     Full                                                             +--------+---------------+---------+-----------+----------------+-------------+  PERO    Full                                                             +--------+---------------+---------+-----------+----------------+-------------+ f   +--------+---------------+---------+-----------+----------------+-------------+ LEFT    CompressibilityPhasicitySpontaneityProperties      Thrombus                                                                  Aging         +--------+---------------+---------+-----------+----------------+-------------+ CFV     Full           No       No         pulsatile                                                                waveforms                     +--------+---------------+---------+-----------+----------------+-------------+ SFJ     Full                                                             +--------+---------------+---------+-----------+----------------+-------------+ FV Prox Full                                                             +--------+---------------+---------+-----------+----------------+-------------+ FV Mid  Full                                                             +--------+---------------+---------+-----------+----------------+-------------+ FV      Full           Yes      No         pulsatile                     Distal                                     waveforms                     +--------+---------------+---------+-----------+----------------+-------------+ PFV     Full           Yes  No                                       +--------+---------------+---------+-----------+----------------+-------------+ POP     Full                                                             +--------+---------------+---------+-----------+----------------+-------------+ PTV     Full                                                             +--------+---------------+---------+-----------+----------------+-------------+ PERO    Full                                                             +--------+---------------+---------+-----------+----------------+-------------+     Summary: RIGHT: - There is no evidence of deep vein thrombosis in the lower extremity.  - No cystic structure found in the popliteal fossa. - Interstitial edema noted throughout right  lower extremity, especially in the calf near site of surgery pulsatile waveforms  LEFT: - There is no evidence of deep vein thrombosis in the lower extremity.  - No cystic structure found in the popliteal fossa. - Interstitial edema noted throughout left lower extremity Pulsatile waveforms.  *See table(s) above for measurements and observations.    Preliminary    CT Angio Chest PE W and/or Wo Contrast Result Date: 07/03/2023 CLINICAL DATA:  Pulmonary embolism (PE) suspected, low to intermediate prob, positive D-dimer. Shortness of breath. EXAM: CT ANGIOGRAPHY CHEST WITH CONTRAST TECHNIQUE: Multidetector CT imaging of the chest was performed using the standard protocol during bolus administration of intravenous contrast. Multiplanar CT image reconstructions and MIPs were obtained to evaluate the vascular anatomy. RADIATION DOSE REDUCTION: This exam was performed according to the departmental dose-optimization program which includes automated exposure control, adjustment of the mA and/or kV according to patient size and/or use of iterative reconstruction technique. CONTRAST:  75mL OMNIPAQUE IOHEXOL 350 MG/ML SOLN COMPARISON:  Chest x-ray today. FINDINGS: Cardiovascular: No filling defects in the pulmonary arteries to suggest pulmonary emboli. Heart is mildly enlarged. Scattered coronary artery and moderate aortic atherosclerosis. No aneurysm. Mediastinum/Nodes: No mediastinal, hilar, or axillary adenopathy. Trachea and esophagus are unremarkable. Thyroid unremarkable. Lungs/Pleura: Small bilateral pleural effusions. Scattered ground-glass opacities throughout the lungs which could reflect early edema. Scarring noted peripherally in the lingula, likely post radiation scarring. Upper Abdomen: No acute findings Musculoskeletal: Chest wall soft tissues are unremarkable. No acute bony abnormality. Review of the MIP images confirms the above findings. IMPRESSION: No evidence of pulmonary embolus. Cardiomegaly, coronary  artery disease. Small bilateral pleural effusions. Ground-glass opacities within the lungs may reflect early edema. Aortic Atherosclerosis (ICD10-I70.0). Electronically Signed   By: Charlett Nose M.D.   On: 07/03/2023 19:34   DG Chest 1 View Result Date: 07/03/2023 CLINICAL DATA:  [id number] SOB (shortness of breath) [id number]  EXAM: CHEST  1 VIEW COMPARISON:  May 29, 2023 FINDINGS: The cardiomediastinal silhouette is unchanged and enlarged in contour. Small LEFT pleural effusion. No pneumothorax. Diffuse interstitial marking with peribronchial cuffing. IMPRESSION: Constellation of findings are favored to reflect pulmonary edema with small LEFT pleural effusion. Electronically Signed   By: Meda Klinefelter M.D.   On: 07/03/2023 16:36    Procedures Procedures    Medications Ordered in ED Medications  iohexol (OMNIPAQUE) 350 MG/ML injection 75 mL (75 mLs Intravenous Contrast Given 07/03/23 1901)  furosemide (LASIX) injection 40 mg (40 mg Intravenous Given 07/03/23 2021)    ED Course/ Medical Decision Making/ A&P                                 Medical Decision Making Amount and/or Complexity of Data Reviewed Labs: ordered.   BP (!) 168/84   Pulse 84   Temp 98.1 F (36.7 C) (Oral)   Resp 18   Ht 5' 2.5" (1.588 m)   Wt 65.8 kg   SpO2 96%   BMI 26.11 kg/m   40:73 PM  78 year old female history of breast cancer recent history of ankle fracture requiring surgical treatment, hypertension, hyperlipidemia, anxiety, CAD presenting with complaints of shortness of breath and ankle swelling.  Patient reports last month she broke her right ankle for which she was hospitalized and had her ankle surgically repaired.  During her hospitalization she was found to have new onset atrial fibrillation and she has been taking Eliquis.  Her right ankle is doing well and is improving but for the past 3 to 4 days she noticed swelling to both ankles, having increased shortness of breath worse at nighttime when she  lays down and feeling fatigued.  She does not endorse fever, productive cough, hemoptysis, pleuritic chest pain, abdominal pain, or calf tenderness.  No prior history of PE or DVT.  Exam notable for bilateral peripheral edema without calf tenderness.  Well-appearing surgical site on right ankle without signs of infection.  1+ pitting to the lower extremities about the ankle with intact distal pulses.  No calf tenderness  -Labs ordered, independently viewed and interpreted by me.  Labs remarkable for elevated D-dimer of 1.28, chest CT angiogram ordered.  Normal Dr. Troponin, doubt ACS.  Negative COVID, flu, RSV.  BNP is elevated at 386, suggestive of CHF. -The patient was maintained on a cardiac monitor.  I personally viewed and interpreted the cardiac monitored which showed an underlying rhythm of: Atrial fibrillation -Imaging independently viewed and interpreted by me and I agree with radiologist's interpretation.  Result remarkable for chest CT angiogram without any evidence of PE however patient does have small bilateral pleural effusion as well as findings suggestive of early edema.  Vascular ultrasound of bilateral extremities without evidence of DVT. -This patient presents to the ED for concern of shortness of breath, this involves an extensive number of treatment options, and is a complaint that carries with it a high risk of complications and morbidity.  The differential diagnosis includes CHF exacerbation, PE, pleural effusion, anemia, hypovolemia, pneumonia -Co morbidities that complicate the patient evaluation includes atrial fibrillation, hypertension, anxiety, CAD -Treatment includes Lasix -Reevaluation of the patient after these medicines showed that the patient improved -PCP office notes or outside notes reviewed -Discussion with attending Dr. Rhae Hammock -Escalation to admission/observation considered: patients feels much better, is comfortable with discharge, and will follow up with  cardiologist -Prescription medication considered, patient  comfortable with lasix -Social Determinant of Health considered   Fortunately no hypoxia, encourage compression stockings for her legs swelling, encourage patient to take Lasix as prescribed and she will follow closely with her cardiologist for further evaluation and management.        Final Clinical Impression(s) / ED Diagnoses Final diagnoses:  Bilateral lower extremity edema    Rx / DC Orders ED Discharge Orders          Ordered    furosemide (LASIX) 20 MG tablet  Daily        07/03/23 2052    Ambulatory referral to Cardiology       Comments: If you have not heard from the Cardiology office within the next 72 hours please call [phone].   07/03/23 2054              Fayrene Helper, PA-C 07/03/23 2057    Durwin Glaze, MD 07/03/23 2237

## 2023-07-03 NOTE — ED Triage Notes (Signed)
Patient here today with c/o SOB X 2 days. Patient states that she has been in Afib since being sick a couple of weeks ago and recently put on Eliquis.

## 2023-07-03 NOTE — Discharge Instructions (Signed)
 Go to ER for further evaluation .

## 2023-07-03 NOTE — ED Provider Triage Note (Signed)
Emergency Medicine Provider Triage Evaluation Note  Sarah Hendricks , a 78 y.o. female  was evaluated in triage.  Pt complains of shortness of breath   Review of Systems  Positive: Swelling legs and shortness of breath Negative: Fever   Physical Exam  BP (!) 168/84   Pulse 84   Temp 98.1 F (36.7 C) (Oral)   Resp 18   Ht 5' 2.5" (1.588 m)   Wt 65.8 kg   SpO2 96%   BMI 26.11 kg/m  Gen:   Awake, no distress   Resp:  Normal effort  MSK:   Swelling bilat lower legs.  Other:    Medical Decision Making  Medically screening exam initiated at 3:40 PM.  Appropriate orders placed.  Lenia Housley was informed that the remainder of the evaluation will be completed by another provider, this initial triage assessment does not replace that evaluation, and the importance of remaining in the ED until their evaluation is complete.     Elson Areas, [place] 07/03/23 1541

## 2023-07-03 NOTE — Progress Notes (Signed)
VASCULAR LAB    Bilateral lower extremity venous duplex has been performed.  See CV proc for preliminary results.   Mohamed Portlock, RVT 07/03/2023, 6:45 PM

## 2023-07-03 NOTE — ED Provider Notes (Signed)
[facility]    CSN: [id number] Arrival date & time: 07/03/23  1428      History   Chief Complaint Chief Complaint  Patient presents with   Shortness of Breath    Afib    HPI Sarah Hendricks is a 78 y.o. female.   Patient presents with shortness of breath that began the evening of 2/13.  Patient also reports noticing some bilateral leg swelling upon waking on 2/14.  Patient also endorses a mild cough and intermittent wheezing.   Denies chest pain, dizziness, fever, lightheadedness, palpitations, and syncope.  Patient was seen about a month ago in the ER for an ankle injury where they also discovered that she was in new onset A-fib.  Patient reports she has been taking Eliquis as prescribed.   Shortness of Breath   Past Medical History:  Diagnosis Date   Allergy    Anxiety    Arthritis    Bimalleolar ankle fracture, right, closed, initial encounter    Breast cancer (HCC) 1994   lumpectomy and XRT, no chemo   CAD (coronary artery disease) 2008   PTCA, in [place]   GERD (gastroesophageal reflux disease)    History of chicken pox    Hyperlipidemia    Hypertension    Myocardial infarction (HCC) 03/15/2007   stent placed   Vitamin D deficiency     Patient Active Problem List   Diagnosis Date Noted   Bimalleolar ankle fracture, right, closed, initial encounter 06/20/2023   New onset a-fib (HCC) 05/30/2023   Closed fracture of right ankle 05/30/2023   Fall, initial encounter 05/29/2023   Localized swelling of left lower leg 03/02/2023   Left hip pain 05/25/2022   Right knee pain 05/25/2022   Left leg pain 05/25/2022   Hyponatremia 02/15/2022   Stress and adjustment reaction 01/04/2020   Prediabetes 05/26/2017   Osteoporosis 05/30/2014   CAD (coronary artery disease)    Hypertension    Hyperlipidemia    History of breast cancer    Vitamin D deficiency     Past Surgical History:  Procedure Laterality Date   BREAST BIOPSY Left 1994   BREAST  LUMPECTOMY Left 1994   CATARACT EXTRACTION, BILATERAL     CORONARY ANGIOPLASTY WITH STENT PLACEMENT  2008   CRANIECTOMY FOR DEPRESSED SKULL FRACTURE  1986   ORIF ANKLE FRACTURE Right 06/20/2023   Procedure: RIGHT OPEN REDUCTION INTERNAL FIXATION (ORIF) ANKLE FRACTURE;  Surgeon: Huel Cote, MD;  Location:  [facility];  Service: Orthopedics;  Laterality: Right;   TONSILLECTOMY  1966    OB History   No obstetric history on file.      Home Medications    Prior to Admission medications   Medication Sig Start Date End Date Taking? Authorizing Provider  apixaban (ELIQUIS) 5 MG TABS tablet Take 1 tablet (5 mg total) by mouth 2 (two) times daily. 07/01/23   Jodelle Red, MD  Cholecalciferol (EQL VITAMIN D3) 1000 units tablet Take 1 tablet (1,000 Units total) by mouth daily. 06/04/15   Pincus Sanes, MD  escitalopram (LEXAPRO) 10 MG tablet TAKE 1 TABLET(10 MG) BY MOUTH DAILY 03/25/23   Pincus Sanes, MD  losartan (COZAAR) 100 MG tablet TAKE 1 TABLET(100 MG) BY MOUTH DAILY 11/26/22   Meriam Sprague, MD  propranolol (INDERAL) 80 MG tablet Take 1 tablet (80 mg total) by mouth 2 (two) times daily. 11/26/22   Meriam Sprague, MD  rosuvastatin (CRESTOR) 10 MG tablet Take 1 tablet (10 mg  total) by mouth daily. 11/26/22   Meriam Sprague, MD    Family History Family History  Problem Relation Age of Onset   Breast cancer Mother    Hypertension Mother    Heart disease Mother    Stroke Mother    Arthritis Father    Heart disease Father        died at 40 of heart attack   Heart disease Brother    Stroke Maternal Grandmother    Heart disease Maternal Grandfather    Colon cancer Neg Hx    Esophageal cancer Neg Hx    Rectal cancer Neg Hx    Stomach cancer Neg Hx     Social History Social History   Tobacco Use   Smoking status: Never   Smokeless tobacco: Never  Vaping Use   Vaping status: Never Used  Substance Use Topics   Alcohol use: No   Drug  use: No     Allergies   Acetaminophen-codeine, Tigan [trimethobenzamide], Alendronate, Atorvastatin, Codeine, and Simvastatin   Review of Systems Review of Systems  Respiratory:  Positive for shortness of breath.    Per HPI  Physical Exam Triage Vital Signs ED Triage Vitals  Encounter Vitals Group     BP 07/03/23 1434 (!) 142/84     Systolic BP Percentile --      Diastolic BP Percentile --      Pulse Rate 07/03/23 1434 78     Resp 07/03/23 1434 16     Temp 07/03/23 1434 97.6 F (36.4 C)     Temp Source 07/03/23 1434 Oral     SpO2 07/03/23 1434 97 %     Weight 07/03/23 1436 145 lb (65.8 kg)     Height 07/03/23 1436 5' 2.5" (1.588 m)     Head Circumference --      Peak Flow --      Pain Score 07/03/23 1437 0     Pain Loc --      Pain Education --      Exclude from Growth Chart --    No data found.  Updated Vital Signs BP (!) 142/84 (BP Location: Right Arm)   Pulse 78   Temp 97.6 F (36.4 C) (Oral)   Resp 16   Ht 5' 2.5" (1.588 m)   Wt 145 lb (65.8 kg)   SpO2 97%   BMI 26.10 kg/m   Visual Acuity Right Eye Distance:   Left Eye Distance:   Bilateral Distance:    Right Eye Near:   Left Eye Near:    Bilateral Near:     Physical Exam Vitals and nursing note reviewed.  Constitutional:      General: She is awake. She is not in acute distress.    Appearance: Normal appearance. She is well-developed and well-groomed. She is not ill-appearing.  Cardiovascular:     Rate and Rhythm: Normal rate. Rhythm irregular.  Pulmonary:     Effort: Pulmonary effort is normal.     Breath sounds: Normal breath sounds.  Musculoskeletal:     Right lower leg: 2+ Edema present.     Left lower leg: 1+ Edema present.  Neurological:     Mental Status: She is alert.  Psychiatric:        Behavior: Behavior is cooperative.      UC Treatments / Results  Labs (all labs ordered are listed, but only abnormal results are displayed) Labs Reviewed - No data to  display  EKG   Radiology  No results found.  Procedures Procedures (including critical care time)  Medications Ordered in UC Medications - No data to display  Initial Impression / Assessment and Plan / UC Course  I have reviewed the triage vital signs and the nursing notes.  Pertinent labs & imaging results that were available during my care of the patient were reviewed by me and considered in my medical decision making (see chart for details).     Patient presented with shortness of breath and bilateral leg swelling.  Patient also endorses mild, intermittent wheezing.  Denies any other related symptoms.  Patient was recently diagnosed with new onset A-fib and is taking Eliquis.  Patient also recently had a surgery to right ankle.  Upon assessment lungs clear bilaterally to auscultation.  Heart rate is normal but irregular rhythm noted consistent with atrial fibrillation.  EKG revealed atrial fibrillation without RVR consistent with prior EKG.  Patient also has bilateral leg edema noted.  Edema in right leg is noted to be worse in left leg, which is possibly related to recent surgery.  Recommended patient be seen in ER due to new onset of shortness of breath and leg swelling with history of A-fib for further evaluation to rule out underlying cardiac etiology. Patient and family ember agreeable to plan at this time.  Patient stable at this time to arrive to ER via POV. Final Clinical Impressions(s) / UC Diagnoses   Final diagnoses:  Shortness of breath  Leg swelling     Discharge Instructions      Go to ER for further evaluation.     ED Prescriptions   None    PDMP not reviewed this encounter.   Wynonia Lawman A, NP 07/03/23 [phone]

## 2023-07-04 ENCOUNTER — Ambulatory Visit (HOSPITAL_COMMUNITY): Payer: Medicare Other | Admitting: Physician Assistant

## 2023-07-04 ENCOUNTER — Ambulatory Visit: Payer: Self-pay | Admitting: Internal Medicine

## 2023-07-04 ENCOUNTER — Ambulatory Visit (INDEPENDENT_AMBULATORY_CARE_PROVIDER_SITE_OTHER): Payer: Medicare Other | Admitting: Emergency Medicine

## 2023-07-04 ENCOUNTER — Other Ambulatory Visit (HOSPITAL_BASED_OUTPATIENT_CLINIC_OR_DEPARTMENT_OTHER): Payer: Self-pay

## 2023-07-04 ENCOUNTER — Encounter: Payer: Self-pay | Admitting: Emergency Medicine

## 2023-07-04 ENCOUNTER — Ambulatory Visit (HOSPITAL_BASED_OUTPATIENT_CLINIC_OR_DEPARTMENT_OTHER): Payer: Medicare Other | Admitting: Family

## 2023-07-04 VITALS — BP 140/82 | HR 82 | Ht 62.5 in | Wt 153.6 lb

## 2023-07-04 VITALS — BP 132/74 | HR 88 | Temp 97.5°F | Ht 62.5 in | Wt 152.0 lb

## 2023-07-04 DIAGNOSIS — I480 Paroxysmal atrial fibrillation: Secondary | ICD-10-CM | POA: Diagnosis not present

## 2023-07-04 DIAGNOSIS — I10 Essential (primary) hypertension: Secondary | ICD-10-CM

## 2023-07-04 DIAGNOSIS — I4891 Unspecified atrial fibrillation: Secondary | ICD-10-CM

## 2023-07-04 DIAGNOSIS — D6859 Other primary thrombophilia: Secondary | ICD-10-CM

## 2023-07-04 DIAGNOSIS — E782 Mixed hyperlipidemia: Secondary | ICD-10-CM

## 2023-07-04 DIAGNOSIS — I5022 Chronic systolic (congestive) heart failure: Secondary | ICD-10-CM | POA: Diagnosis not present

## 2023-07-04 MED ORDER — FUROSEMIDE 20 MG PO TABS
20.0000 mg | ORAL_TABLET | Freq: Every day | ORAL | 11 refills | Status: DC
  Filled 2023-07-04 – 2023-07-07 (×3): qty 30, 30d supply, fill #0
  Filled 2023-07-29 – 2023-08-01 (×2): qty 30, 30d supply, fill #1
  Filled 2023-09-03: qty 30, 30d supply, fill #2

## 2023-07-04 NOTE — Telephone Encounter (Signed)
Copied from CRM [id number]. Topic: Clinical - Red Word Triage >> Jul 04, 2023 10:32 AM Drema Balzarine wrote: Red Word that prompted transfer to Nurse Triage: Patient seen at er yesterday for shortness of breath, daughter is at pharmacy now picking up Lasix and says patient is still having a hard time breathing  Chief Complaint: difficulty breathing Symptoms: sob, wheezing Frequency: constant; was seen in er yesterday Pertinent Negatives: Patient denies fever, cp Disposition: [] ED /[] Urgent Care (no appt availability in office) / [x] Appointment(In office/virtual)/ []  [facility]/ [] Home Care/ [] Refused Recommended Disposition /[] [facility]/ []  Follow-up with PCP Additional Notes: daughter is at pharmacy picking up lasix as ordered in er last night.  Apt made for today; denies questions, instructed to go to er if becomes worse.   Reason for Disposition  [1] Longstanding difficulty breathing (e.g., CHF, COPD, emphysema) AND [2] WORSE than normal  Answer Assessment - Initial Assessment Questions 1. RESPIRATORY STATUS: "Describe your breathing?" (e.g., wheezing, shortness of breath, unable to speak, severe coughing)      Shortness of breath 2. ONSET: "When did this breathing problem begin?"      yesterday 3. PATTERN "Does the difficult breathing come and go, or has it been constant since it started?"      constant 4. SEVERITY: "How bad is your breathing?" (e.g., mild, moderate, severe)    - MILD: No SOB at rest, mild SOB with walking, speaks normally in sentences, can lie down, no retractions, pulse < 100.    - MODERATE: SOB at rest, SOB with minimal exertion and prefers to sit, cannot lie down flat, speaks in phrases, mild retractions, audible wheezing, pulse 100-120.    - SEVERE: Very SOB at rest, speaks in single words, struggling to breathe, sitting hunched forward, retractions, pulse > 120      Moderate - severe, wheezing,  5. RECURRENT SYMPTOM: "Have you had difficulty  breathing before?" If Yes, ask: "When was the last time?" and "What happened that time?"      yes 6. CARDIAC HISTORY: "Do you have any history of heart disease?" (e.g., heart attack, angina, bypass surgery, angioplasty)      Heart attack 7. LUNG HISTORY: "Do you have any history of lung disease?"  (e.g., pulmonary embolus, asthma, emphysema)     denies 8. CAUSE: "What do you think is causing the breathing problem?"      unknown 9. OTHER SYMPTOMS: "Do you have any other symptoms? (e.g., dizziness, runny nose, cough, chest pain, fever)     cough 10. O2 SATURATION MONITOR:  "Do you use an oxygen saturation monitor (pulse oximeter) at home?" If Yes, ask: "What is your reading (oxygen level) today?" "What is your usual oxygen saturation reading?" (e.g., 95%)       na 11. PREGNANCY: "Is there any chance you are pregnant?" "When was your last menstrual period?"       na 12. TRAVEL: "Have you traveled out of the country in the last month?" (e.g., travel history, exposures)       na  Protocols used: Breathing Difficulty-A-AH

## 2023-07-04 NOTE — Assessment & Plan Note (Signed)
BP Readings from Last 3 Encounters:  07/04/23 132/74  07/04/23 (!) 140/82  07/03/23 (!) 169/78  Well-controlled hypertension Continue losartan 100 mg daily, propranolol 80 mg twice daily and furosemide 20 mg daily

## 2023-07-04 NOTE — Progress Notes (Signed)
Sarah Hendricks 78 y.o.   Chief Complaint  Patient presents with   Respiratory Distress    Patient went to the ED yesterday and they told her has fluid around her heart and lung. She was given some lasiks and she states she still feels bad. She had a appt for her Cardioversion on Wednesday. Pt wanted to know if she needs she carry a card stating that she has screws and metal in her foot for traveling purpose     HISTORY OF PRESENT ILLNESS: This is a 78 y.o. female here for follow-up of emergency department visit yesterday when she presented with shortness of breath and new onset A-fib Was able to follow-up with cardiologist office today.  Scheduled for cardioversion on 07/15/2023.  Was started on Eliquis. Feels much better today.  Accompanied by daughter. No new complaints or new concerns today. CTA of chest done yesterday, report as follows:  IMPRESSION: No evidence of pulmonary embolus.   Cardiomegaly, coronary artery disease.   Small bilateral pleural effusions.   Ground-glass opacities within the lungs may reflect early edema.   Aortic Atherosclerosis (ICD10-I70.0). HPI   Prior to Admission medications   Medication Sig Start Date End Date Taking? Authorizing Provider  apixaban (ELIQUIS) 5 MG TABS tablet Take 1 tablet (5 mg total) by mouth 2 (two) times daily. 07/01/23  Yes Jodelle Red, MD  Cholecalciferol (EQL VITAMIN D3) 1000 units tablet Take 1 tablet (1,000 Units total) by mouth daily. 06/04/15  Yes Burns, Bobette Mo, MD  escitalopram (LEXAPRO) 10 MG tablet TAKE 1 TABLET(10 MG) BY MOUTH DAILY 03/25/23  Yes Burns, Bobette Mo, MD  furosemide (LASIX) 20 MG tablet Take 1 tablet (20 mg total) by mouth daily. 07/04/23  Yes Alver Sorrow, NP  losartan (COZAAR) 100 MG tablet TAKE 1 TABLET(100 MG) BY MOUTH DAILY 11/26/22  Yes Meriam Sprague, MD  propranolol (INDERAL) 80 MG tablet Take 1 tablet (80 mg total) by mouth 2 (two) times daily. 11/26/22  Yes Meriam Sprague,  MD  rosuvastatin (CRESTOR) 10 MG tablet Take 1 tablet (10 mg total) by mouth daily. 11/26/22  Yes Meriam Sprague, MD    Allergies  Allergen Reactions   Acetaminophen-Codeine Other (See Comments)    Bad dreams   Tigan [Trimethobenzamide] Other (See Comments)    Bad dreams   Alendronate     Caused leg pains    Atorvastatin Other (See Comments)    Pt reports "causes bilateral leg pains."   Codeine     Bad dreams   Simvastatin Other (See Comments)    Leg cramps    Patient Active Problem List   Diagnosis Date Noted   Bimalleolar ankle fracture, right, closed, initial encounter 06/20/2023   New onset a-fib (HCC) 05/30/2023   Closed fracture of right ankle 05/30/2023   Fall, initial encounter 05/29/2023   Localized swelling of left lower leg 03/02/2023   Left hip pain 05/25/2022   Right knee pain 05/25/2022   Left leg pain 05/25/2022   Hyponatremia 02/15/2022   Stress and adjustment reaction 01/04/2020   Prediabetes 05/26/2017   Osteoporosis 05/30/2014   CAD (coronary artery disease)    Hypertension    Hyperlipidemia    History of breast cancer    Vitamin D deficiency     Past Medical History:  Diagnosis Date   Allergy    Anxiety    Arthritis    Bimalleolar ankle fracture, right, closed, initial encounter    Breast cancer (HCC) 1994  lumpectomy and XRT, no chemo   CAD (coronary artery disease) 2008   PTCA, in [place]   GERD (gastroesophageal reflux disease)    History of chicken pox    Hyperlipidemia    Hypertension    Myocardial infarction (HCC) 03/15/2007   stent placed   Vitamin D deficiency     Past Surgical History:  Procedure Laterality Date   BREAST BIOPSY Left 1994   BREAST LUMPECTOMY Left 1994   CATARACT EXTRACTION, BILATERAL     CORONARY ANGIOPLASTY WITH STENT PLACEMENT  2008   CRANIECTOMY FOR DEPRESSED SKULL FRACTURE  1986   ORIF ANKLE FRACTURE Right 06/20/2023   Procedure: RIGHT OPEN REDUCTION INTERNAL FIXATION (ORIF) ANKLE FRACTURE;  Surgeon:  Huel Cote, MD;  Location: [facility];  Service: Orthopedics;  Laterality: Right;   TONSILLECTOMY  1966    Social History   Socioeconomic History   Marital status: Married    Spouse name: Not on file   Number of children: Not on file   Years of education: Not on file   Highest education level: Not on file  Occupational History   Not on file  Tobacco Use   Smoking status: Never   Smokeless tobacco: Never  Vaping Use   Vaping status: Never Used  Substance and Sexual Activity   Alcohol use: No   Drug use: No   Sexual activity: Not Currently    Birth control/protection: Post-menopausal  Other Topics Concern   Not on file  Social History Narrative   Married, lives with spouse   Retired from [employer] irregularly, rides an exercise bike   Social Drivers of Health   Financial Resource Strain: Low Risk  (09/23/2022)   Overall Financial Resource Strain (CARDIA)    Difficulty of Paying Living Expenses: Not hard at all  Food Insecurity: No Food Insecurity (05/30/2023)   Hunger Vital Sign    Worried About Running Out of Food in the Last Year: Never true    Ran Out of Food in the Last Year: Never true  Transportation Needs: No Transportation Needs (05/30/2023)   PRAPARE - Administrator, Civil Service (Medical): No    Lack of Transportation (Non-Medical): No  Physical Activity: Sufficiently Active (09/23/2022)   Exercise Vital Sign    Days of Exercise per Week: 5 days    Minutes of Exercise per Session: 30 min  Stress: No Stress Concern Present (09/23/2022)   [employer] of Occupational Health - Occupational Stress Questionnaire    Feeling of Stress : Not at all  Social Connections: Socially Integrated (05/30/2023)   Social Connection and Isolation Panel [NHANES]    Frequency of Communication with Friends and Family: More than three times a week    Frequency of Social Gatherings with Friends and Family: More than three times a week     Attends Religious Services: More than 4 times per year    Active Member of [employer] or Organizations: No    Attends Engineer, structural: More than 4 times per year    Marital Status: Married  Catering manager Violence: Not At Risk (05/30/2023)   Humiliation, Afraid, Rape, and Kick questionnaire    Fear of Current or Ex-Partner: No    Emotionally Abused: No    Physically Abused: No    Sexually Abused: No    Family History  Problem Relation Age of Onset   Breast cancer Mother    Hypertension Mother    Heart  disease Mother    Stroke Mother    Arthritis Father    Heart disease Father        died at 42 of heart attack   Heart disease Brother    Stroke Maternal Grandmother    Heart disease Maternal Grandfather    Colon cancer Neg Hx    Esophageal cancer Neg Hx    Rectal cancer Neg Hx    Stomach cancer Neg Hx      Review of Systems  Constitutional: Negative.  Negative for chills and fever.  HENT: Negative.  Negative for congestion, nosebleeds and sore throat.   Respiratory: Negative.  Negative for cough, hemoptysis and shortness of breath.   Cardiovascular: Negative.  Negative for chest pain and palpitations.  Gastrointestinal:  Negative for abdominal pain, nausea and vomiting.  Genitourinary: Negative.  Negative for dysuria and hematuria.  Skin: Negative.  Negative for rash.  Neurological: Negative.  Negative for dizziness and headaches.  All other systems reviewed and are negative.   Vitals:   07/04/23 1529  BP: 132/74  Pulse: 88  Temp: (!) 97.5 F (36.4 C)  SpO2: 93%    Physical Exam Vitals reviewed.  Constitutional:      Appearance: Normal appearance.  HENT:     Mouth/Throat:     Mouth: Mucous membranes are moist.     Pharynx: Oropharynx is clear.  Eyes:     Extraocular Movements: Extraocular movements intact.     Conjunctiva/sclera: Conjunctivae normal.     Pupils: Pupils are equal, round, and reactive to light.  Cardiovascular:     Rate and  Rhythm: Normal rate. Rhythm irregular.  Pulmonary:     Effort: Pulmonary effort is normal.     Breath sounds: Normal breath sounds.  Musculoskeletal:     Cervical back: No tenderness.  Lymphadenopathy:     Cervical: No cervical adenopathy.  Skin:    General: Skin is warm and dry.     Capillary Refill: Capillary refill takes less than 2 seconds.  Neurological:     General: No focal deficit present.     Mental Status: She is alert and oriented to person, place, and time.  Psychiatric:        Mood and Affect: Mood normal.        Behavior: Behavior normal.      ASSESSMENT & PLAN: A total of 44-minutes was spent with the patient and counseling/coordination of care regarding preparing for this visit, review of most recent office visit notes, review of multiple chronic medical conditions and their management, review of most recent emergency department visit from yesterday, review of most recent cardiologist office visit notes today, review of most recent CTA of chest report, review of all medications, review of most recent bloodwork results, review of health maintenance items, education on nutrition, prognosis, documentation, and need for follow up.   Problem List Items Addressed This Visit       Cardiovascular and Mediastinum   Hypertension   BP Readings from Last 3 Encounters:  07/04/23 132/74  07/04/23 (!) 140/82  07/03/23 (!) 169/78  Well-controlled hypertension Continue losartan 100 mg daily, propranolol 80 mg twice daily and furosemide 20 mg daily       New onset atrial fibrillation (HCC) - Primary   Controlled ventricular rate On Eliquis 5 mg twice a day.  No clinical bleeding episodes Tolerating it well Normotensive Cardiologist office visit notes from today reviewed Emergency department visit notes from yesterday reviewed CTA of chest report reviewed Scheduled  for cardioversion 07/15/2023 Clinically stable.  No concerns today.      Patient Instructions  Atrial  Fibrillation Atrial fibrillation (AFib) is a type of heartbeat that is irregular or fast. If you have AFib, your heart beats without any order. This makes it hard for your heart to pump blood in a normal way. AFib may come and go, or it may become a long-lasting problem. If AFib is not treated, it can put you at higher risk for stroke, heart failure, and other heart problems. What are the causes? AFib may be caused by diseases that damage the heart's electrical system. They include: High blood pressure. Heart failure. Heart valve diseases. Heart surgery. Diabetes. Thyroid disease. Kidney disease. Lung diseases, such as pneumonia or COPD. Sleep apnea. Sometimes the cause is not known. What increases the risk? You are more likely to develop AFib if: You are older. You exercise often and very hard. You have a family history of AFib. You are female. You are Caucasian. You are overweight. You smoke. You drink a lot of alcohol. What are the signs or symptoms? Common symptoms of this condition include: A feeling that your heart is beating very fast. Chest pain or discomfort. Feeling short of breath. Suddenly feeling light-headed or weak. Getting tired easily during activity. Fainting. Sweating. In some cases, there are no symptoms. How is this treated? Medicines to: Prevent blood clots. Treat heart rate or heart rhythm problems. Using devices, such as a pacemaker, to correct heart rhythm problems. Doing surgery to remove the part of the heart that sends bad signals. Closing an area where clots can form in the heart (left atrial appendage). In some cases, your doctor will treat other underlying conditions. Follow these instructions at home: Medicines Take over-the-counter and prescription medicines only as told by your doctor. Do not take any new medicines without first talking to your doctor. If you are taking blood thinners: Talk with your doctor before taking aspirin or  NSAIDs, such as ibuprofen. Take your medicines as told. Take them at the same time each day. Do not do things that could hurt or bruise you. Be careful to avoid falls. Wear an alert bracelet or carry a card that says you take blood thinners. Lifestyle Do not smoke or use any products that contain nicotine or tobacco. If you need help quitting, ask your doctor. Eat heart-healthy foods. Talk with your doctor about the right eating plan for you. Exercise regularly as told by your doctor. Do not drink alcohol. Lose weight if you are overweight. General instructions If you have sleep apnea, treat it as told by your doctor. Do not use diet pills unless your doctor says they are safe for you. Diet pills may make heart problems worse. Keep all follow-up visits. Your doctor will check your heart rate and rhythm regularly. Contact a doctor if: You notice a change in the speed, rhythm, or strength of your heartbeat. You are taking a blood-thinning medicine and you get more bruising. You get tired more easily when you move or exercise. You have a sudden change in weight. Get help right away if:  You have pain in your chest. You have trouble breathing. You have side effects of blood thinners, such as blood in your vomit, poop (stool), or pee (urine), or bleeding that cannot stop. You have any signs of a stroke. "BE FAST" is an easy way to remember the main warning signs: B - Balance. Dizziness, sudden trouble walking, or loss of balance. E -  Eyes. Trouble seeing or a change in how you see. F - Face. Sudden weakness or loss of feeling in the face. The face or eyelid may droop on one side. A - Arms.Weakness or loss of feeling in an arm. This happens suddenly and usually on one side of the body. S - Speech. Sudden trouble speaking, slurred speech, or trouble understanding what people say. T - Time.Time to call emergency services. Write down what time symptoms started. You have other signs of a stroke,  such as: A sudden, very bad headache with no known cause. Feeling like you may vomit (nausea). Vomiting. A seizure. These symptoms may be an emergency. Get help right away. Call 911. Do not wait to see if the symptoms will go away. Do not drive yourself to the hospital. This information is not intended to replace advice given to you by your health care provider. Make sure you discuss any questions you have with your health care provider. Document Revised: 01/20/2022 Document Reviewed: 01/20/2022 Elsevier Patient Education  2024 Elsevier Inc.    Edwina Barth, MD [facility]

## 2023-07-04 NOTE — H&P (View-Only) (Signed)
 Cardiology Office Note:  .   Date:  07/06/2023  ID:  [name], DOB [birth year], MRN [id number] PCP: Pincus Sanes, MD  [facility] Providers Cardiologist:  Jodelle Red, MD    History of Present Illness: .   Sarah Hendricks is a 78 y.o. female with hx of atrial fibrillation, HFrEF, tremor, CAD s/p PCI 2008, HTN, HLD, hyponatremia  SGLT2i deferred due to prior UTI and yeast infection.  Admitted 05/2023 after fall in her kitchen with lightheadedness. New onset atrial fibrillation. Echo 05/30/23 LVEF 45-50%, mild LVH, RV normal. LVEF similar to prior echo 11/2022.She was seen by ortho for right ankle fracture. Underwent right ORIF of ankle 06/20/23.  ED visit 07/03/2023 with exertional dyspnea, lower extremity edema.  D-dimer elevated, CTA with no PE.  BNP 386.  EKG rate controlled atrial fibrillation.  Lower extremity duplex with no DVT.  She was given 40 mg IV Lasix and discharged on Lasix 20 mg daily.  Presents today for follow up with her daughter and grandson.  LE edema improved since ED visit.  Wearing compression stockings during the day with improvement.  LE edema is bilateral and worse by end of day.  Exertional dyspnea improved since diuresis. She does feel her palpitations intermittently associated with her atrial fibrillation.   ROS: Please see the history of present illness.    All other systems reviewed and are negative.   Studies Reviewed: .        Cardiac Studies & Procedures   ______________________________________________________________________________________________   STRESS TESTS  EXERCISE TOLERANCE TEST (ETT) 06/01/2016  Narrative  The patient walked for 6:16 of a Bruce protocol. Peak HR of 141 which is 93% predicted maxmial HR.  There were no ST or T wave changes to suggest ischemia.  BP response to exercise was normal.   ECHOCARDIOGRAM  ECHOCARDIOGRAM COMPLETE 05/30/2023  Narrative ECHOCARDIOGRAM REPORT    Patient Name:   Sarah Hendricks Date of Exam: 05/30/2023 Medical Rec #:  [id number]        Height:       63.0 in Accession #:    [id number]       Weight:       156.1 lb Date of Birth:  12-21-1945        BSA:          1.740 m Patient Age:    77 years         BP:           115/69 mmHg Patient Gender: F                HR:           87 bpm. Exam Location:  Inpatient  Procedure: 2D Echo, Cardiac Doppler, Color Doppler and Intracardiac Opacification Agent  Indications:    Atrial Fibrillation  History:        Patient has prior history of Echocardiogram examinations, most recent 12/02/2022. CAD; Risk Factors:Hypertension.  Sonographer:    Webb Laws Referring Phys: [id number] PROSPER M AMPONSAH  IMPRESSIONS   1. Unchanged apical akinesis. Left ventricular ejection fraction, by estimation, is 45 to 50%. The left ventricle has mildly decreased function. There is mild concentric left ventricular hypertrophy. Left ventricular diastolic parameters are indeterminate. 2. Right ventricular systolic function is normal. The right ventricular size is normal. 3. Left atrial size was moderately dilated. 4. The mitral valve is normal in structure. No evidence of mitral valve regurgitation. 5. The aortic valve is tricuspid. Aortic valve regurgitation is  not visualized. 6. The inferior vena cava is normal in size with greater than 50% respiratory variability, suggesting right atrial pressure of 3 mmHg.  FINDINGS Left Ventricle: Unchanged apical akinesis. Left ventricular ejection fraction, by estimation, is 45 to 50%. The left ventricle has mildly decreased function. Definity contrast agent was given IV to delineate the left ventricular endocardial borders. The left ventricular internal cavity size was normal in size. There is mild concentric left ventricular hypertrophy. Left ventricular diastolic parameters are indeterminate.  Right Ventricle: The right ventricular size is normal. Right ventricular systolic function is  normal.  Left Atrium: Left atrial size was moderately dilated.  Right Atrium: Right atrial size was normal in size.  Pericardium: There is no evidence of pericardial effusion.  Mitral Valve: The mitral valve is normal in structure. No evidence of mitral valve regurgitation.  Tricuspid Valve: Tricuspid valve regurgitation is mild.  Aortic Valve: The aortic valve is tricuspid. Aortic valve regurgitation is not visualized.  Pulmonic Valve: Pulmonic valve regurgitation is not visualized.  Aorta: The aortic root and ascending aorta are structurally normal, with no evidence of dilitation.  Venous: The inferior vena cava is normal in size with greater than 50% respiratory variability, suggesting right atrial pressure of 3 mmHg.  IAS/Shunts: The interatrial septum was not well visualized.   LEFT VENTRICLE PLAX 2D LVIDd:         3.70 cm     Diastology LVIDs:         2.60 cm     LV e' medial:    6.64 cm/s LV PW:         1.20 cm     LV E/e' medial:  12.8 LV IVS:        1.10 cm     LV e' lateral:   7.40 cm/s LVOT diam:     1.80 cm     LV E/e' lateral: 11.5 LV SV:         25 LV SV Index:   15 LVOT Area:     2.54 cm  LV Volumes (MOD) LV vol d, MOD A2C: 54.6 ml LV vol d, MOD A4C: 88.5 ml LV vol s, MOD A2C: 33.9 ml LV vol s, MOD A4C: 43.8 ml LV SV MOD A2C:     20.7 ml LV SV MOD A4C:     88.5 ml LV SV MOD BP:      33.0 ml  RIGHT VENTRICLE            IVC RV Basal diam:  1.90 cm    IVC diam: 1.60 cm RV S prime:     8.38 cm/s TAPSE (M-mode): 1.4 cm  LEFT ATRIUM             Index        RIGHT ATRIUM          Index LA diam:        3.90 cm 2.24 cm/m   RA Area:     6.04 cm LA Vol (A2C):   32.3 ml 18.56 ml/m  RA Volume:   6.44 ml  3.70 ml/m LA Vol (A4C):   39.9 ml 22.93 ml/m LA Biplane Vol: 39.3 ml 22.58 ml/m AORTIC VALVE LVOT Vmax:   57.90 cm/s LVOT Vmean:  35.760 cm/s LVOT VTI:    0.099 m  AORTA Ao Root diam: 2.50 cm Ao Asc diam:  3.00 cm  MITRAL VALVE  TRICUSPID VALVE MV Area (PHT): 4.24 cm    TR Peak grad:   31.6 mmHg MV Decel Time: 179 msec    TR Vmax:        281.00 cm/s MV E velocity: 84.90 cm/s SHUNTS Systemic VTI:  0.10 m Systemic Diam: 1.80 cm  Sarah Hendricks Electronically signed by Sarah Hendricks Signature Date/Time: 05/30/2023/3:29:34 PM    Final          ______________________________________________________________________________________________      Risk Assessment/Calculations:    CHA2DS2-VASc Score = 6   This indicates a 9.7% annual risk of stroke. The patient's score is based upon: CHF History: 1 HTN History: 1 Diabetes History: 0 Stroke History: 0 Vascular Disease History: 1 Age Score: 2 Gender Score: 1           Physical Exam:   VS:  BP (!) 140/82   Pulse 82   Ht 5' 2.5" (1.588 m)   Wt 153 lb 9.6 oz (69.7 kg)   SpO2 95%   BMI 27.65 kg/m    Wt Readings from Last 3 Encounters:  07/04/23 152 lb (68.9 kg)  07/04/23 153 lb 9.6 oz (69.7 kg)  07/03/23 145 lb 1 oz (65.8 kg)    GEN: Well nourished, well developed in no acute distress NECK: No JVD; No carotid bruits CARDIAC: IRIR, no murmurs, rubs, gallops RESPIRATORY:  Clear to auscultation without rales, wheezing or rhonchi  ABDOMEN: Soft, non-tender, non-distended EXTREMITIES:  No edema; No deformity   ASSESSMENT AND PLAN: .    Atrial fibrillation/hypercoagulable state- Rate controlled. Will plan for cardioversion. OAC was resumed 06/21/23 post ORIF of right ankle. Plan for cardioversion 07/15/23.  CHA2DS2-VASc Score = 6 [CHF History: 1, HTN History: 1, Diabetes History: 0, Stroke History: 0, Vascular Disease History: 1, Age Score: 2, Gender Score: 1].  Therefore, the patient's annual risk of stroke is 9.7 %.    Continue Eliquis 5mg  BID. Denies bleeding complications.   HFmrEF /DOE/LE edema/elevated BNP/Varicose veins / Hyponatremia - hydrochlorothiazide previously stopped due to hyponatremia. Now requiring Lasix 20mg  daily for LE edema, dyspnea in  setting of fluid retention in atrial fibrillation. Echo 05/2023 stable LVEF 45-50%. Anticipate worsened fluid retention due to diastolic dysfunction while in atrial fib. Plan for cardioversion, as above. Continue Lasix 20mg  daily, Losartan 100mg  daily, Propranolol 80mg  daily. Continue compression stockings, low sodium diet. CBC/BMP in 1 week for monitoring of hyponatremia and pre-DCCV labs.  If volume status does not improve post cardioversion, consider transition from Losartan to Entresto vs addition of Spironolactone. SLGT2i previously deferred due to UTI hx.   HTN- BP elevated in setting of volume overload. Continue Lasix 20mg  daily, Losartan 100mg  daily, Propranolol 80mg  daily. Discussed to monitor BP at home at least 2 hours after medications and sitting for 5-10 minutes.   CAD s/p PCI 2008/HLD, LDL goal less than 70 - Stable with no anginal symptoms. No indication for ischemic evaluation.  GDMT Propranolol, Rosuvastatin. No ASA due to [facility]. Recommend aiming for 150 minutes of moderate intensity activity per week and following a heart healthy diet.      Informed Consent   Shared Decision Making/Informed Consent The risks (stroke, cardiac arrhythmias rarely resulting in the need for a temporary or permanent pacemaker, skin irritation or burns and complications associated with conscious sedation including aspiration, arrhythmia, respiratory failure and death), benefits (restoration of normal sinus rhythm) and alternatives of a direct current cardioversion were explained in detail to Ms. Jarold Motto and she agrees to proceed.  Dispo: follow up 2-3 weeks after DCCV  Signed, Alver Sorrow, NP

## 2023-07-04 NOTE — Assessment & Plan Note (Signed)
Controlled ventricular rate On Eliquis 5 mg twice a day.  No clinical bleeding episodes Tolerating it well Normotensive Cardiologist office visit notes from today reviewed Emergency department visit notes from yesterday reviewed CTA of chest report reviewed Scheduled for cardioversion 07/15/2023 Clinically stable.  No concerns today.

## 2023-07-04 NOTE — Patient Instructions (Signed)
Atrial Fibrillation Atrial fibrillation (AFib) is a type of heartbeat that is irregular or fast. If you have AFib, your heart beats without any order. This makes it hard for your heart to pump blood in a normal way. AFib may come and go, or it may become a long-lasting problem. If AFib is not treated, it can put you at higher risk for stroke, heart failure, and other heart problems. What are the causes? AFib may be caused by diseases that damage the heart's electrical system. They include: High blood pressure. Heart failure. Heart valve diseases. Heart surgery. Diabetes. Thyroid disease. Kidney disease. Lung diseases, such as pneumonia or COPD. Sleep apnea. Sometimes the cause is not known. What increases the risk? You are more likely to develop AFib if: You are older. You exercise often and very hard. You have a family history of AFib. You are female. You are Caucasian. You are overweight. You smoke. You drink a lot of alcohol. What are the signs or symptoms? Common symptoms of this condition include: A feeling that your heart is beating very fast. Chest pain or discomfort. Feeling short of breath. Suddenly feeling light-headed or weak. Getting tired easily during activity. Fainting. Sweating. In some cases, there are no symptoms. How is this treated? Medicines to: Prevent blood clots. Treat heart rate or heart rhythm problems. Using devices, such as a pacemaker, to correct heart rhythm problems. Doing surgery to remove the part of the heart that sends bad signals. Closing an area where clots can form in the heart (left atrial appendage). In some cases, your doctor will treat other underlying conditions. Follow these instructions at home: Medicines Take over-the-counter and prescription medicines only as told by your doctor. Do not take any new medicines without first talking to your doctor. If you are taking blood thinners: Talk with your doctor before taking aspirin  or NSAIDs, such as ibuprofen. Take your medicines as told. Take them at the same time each day. Do not do things that could hurt or bruise you. Be careful to avoid falls. Wear an alert bracelet or carry a card that says you take blood thinners. Lifestyle Do not smoke or use any products that contain nicotine or tobacco. If you need help quitting, ask your doctor. Eat heart-healthy foods. Talk with your doctor about the right eating plan for you. Exercise regularly as told by your doctor. Do not drink alcohol. Lose weight if you are overweight. General instructions If you have sleep apnea, treat it as told by your doctor. Do not use diet pills unless your doctor says they are safe for you. Diet pills may make heart problems worse. Keep all follow-up visits. Your doctor will check your heart rate and rhythm regularly. Contact a doctor if: You notice a change in the speed, rhythm, or strength of your heartbeat. You are taking a blood-thinning medicine and you get more bruising. You get tired more easily when you move or exercise. You have a sudden change in weight. Get help right away if:  You have pain in your chest. You have trouble breathing. You have side effects of blood thinners, such as blood in your vomit, poop (stool), or pee (urine), or bleeding that cannot stop. You have any signs of a stroke. "BE FAST" is an easy way to remember the main warning signs: B - Balance. Dizziness, sudden trouble walking, or loss of balance. E - Eyes. Trouble seeing or a change in how you see. F - Face. Sudden weakness or loss of  feeling in the face. The face or eyelid may droop on one side. A - Arms.Weakness or loss of feeling in an arm. This happens suddenly and usually on one side of the body. S - Speech. Sudden trouble speaking, slurred speech, or trouble understanding what people say. T - Time.Time to call emergency services. Write down what time symptoms started. You have other signs of a  stroke, such as: A sudden, very bad headache with no known cause. Feeling like you may vomit (nausea). Vomiting. A seizure. These symptoms may be an emergency. Get help right away. Call 911. Do not wait to see if the symptoms will go away. Do not drive yourself to the hospital. This information is not intended to replace advice given to you by your health care provider. Make sure you discuss any questions you have with your health care provider. Document Revised: 01/20/2022 Document Reviewed: 01/20/2022 Elsevier Patient Education  2024 [employer].

## 2023-07-04 NOTE — Progress Notes (Unsigned)
Cardiology Office Note:  .   Date:  07/06/2023  ID:  Sarah Hendricks, DOB February 27, 1946, MRN [id number] PCP: Pincus Sanes, MD  [facility] Providers Cardiologist:  Jodelle Red, MD    History of Present Illness: .   Sarah Hendricks is a 78 y.o. female with hx of atrial fibrillation, HFrEF, tremor, CAD s/p PCI 2008, HTN, HLD, hyponatremia  SGLT2i deferred due to prior UTI and yeast infection.  Admitted 05/2023 after fall in her kitchen with lightheadedness. New onset atrial fibrillation. Echo 05/30/23 LVEF 45-50%, mild LVH, RV normal. LVEF similar to prior echo 11/2022.She was seen by ortho for right ankle fracture. Underwent right ORIF of ankle 06/20/23.  ED visit 07/03/2023 with exertional dyspnea, lower extremity edema.  D-dimer elevated, CTA with no PE.  BNP 386.  EKG rate controlled atrial fibrillation.  Lower extremity duplex with no DVT.  She was given 40 mg IV Lasix and discharged on Lasix 20 mg daily.  Presents today for follow up with her daughter and grandson.  LE edema improved since ED visit.  Wearing compression stockings during the day with improvement.  LE edema is bilateral and worse by end of day.  Exertional dyspnea improved since diuresis. She does feel her palpitations intermittently associated with her atrial fibrillation.   ROS: Please see the history of present illness.    All other systems reviewed and are negative.   Studies Reviewed: .        Cardiac Studies & Procedures   ______________________________________________________________________________________________   STRESS TESTS  EXERCISE TOLERANCE TEST (ETT) 06/01/2016  Narrative  The patient walked for 6:16 of a Bruce protocol. Peak HR of 141 which is 93% predicted maxmial HR.  There were no ST or T wave changes to suggest ischemia.  BP response to exercise was normal.   ECHOCARDIOGRAM  ECHOCARDIOGRAM COMPLETE 05/30/2023  Narrative ECHOCARDIOGRAM REPORT    Patient Name:   Sarah Hendricks Date of Exam: 05/30/2023 Medical Rec #:  [id number]        Height:       63.0 in Accession #:    [id number]       Weight:       156.1 lb Date of Birth:  1945/08/18        BSA:          1.740 m Patient Age:    77 years         BP:           115/69 mmHg Patient Gender: F                HR:           87 bpm. Exam Location:  Inpatient  Procedure: 2D Echo, Cardiac Doppler, Color Doppler and Intracardiac Opacification Agent  Indications:    Atrial Fibrillation  History:        Patient has prior history of Echocardiogram examinations, most recent 12/02/2022. CAD; Risk Factors:Hypertension.  Sonographer:    Webb Laws Referring Phys: [id number] PROSPER M AMPONSAH  IMPRESSIONS   1. Unchanged apical akinesis. Left ventricular ejection fraction, by estimation, is 45 to 50%. The left ventricle has mildly decreased function. There is mild concentric left ventricular hypertrophy. Left ventricular diastolic parameters are indeterminate. 2. Right ventricular systolic function is normal. The right ventricular size is normal. 3. Left atrial size was moderately dilated. 4. The mitral valve is normal in structure. No evidence of mitral valve regurgitation. 5. The aortic valve is tricuspid. Aortic valve regurgitation is  not visualized. 6. The inferior vena cava is normal in size with greater than 50% respiratory variability, suggesting right atrial pressure of 3 mmHg.  FINDINGS Left Ventricle: Unchanged apical akinesis. Left ventricular ejection fraction, by estimation, is 45 to 50%. The left ventricle has mildly decreased function. Definity contrast agent was given IV to delineate the left ventricular endocardial borders. The left ventricular internal cavity size was normal in size. There is mild concentric left ventricular hypertrophy. Left ventricular diastolic parameters are indeterminate.  Right Ventricle: The right ventricular size is normal. Right ventricular systolic function is  normal.  Left Atrium: Left atrial size was moderately dilated.  Right Atrium: Right atrial size was normal in size.  Pericardium: There is no evidence of pericardial effusion.  Mitral Valve: The mitral valve is normal in structure. No evidence of mitral valve regurgitation.  Tricuspid Valve: Tricuspid valve regurgitation is mild.  Aortic Valve: The aortic valve is tricuspid. Aortic valve regurgitation is not visualized.  Pulmonic Valve: Pulmonic valve regurgitation is not visualized.  Aorta: The aortic root and ascending aorta are structurally normal, with no evidence of dilitation.  Venous: The inferior vena cava is normal in size with greater than 50% respiratory variability, suggesting right atrial pressure of 3 mmHg.  IAS/Shunts: The interatrial septum was not well visualized.   LEFT VENTRICLE PLAX 2D LVIDd:         3.70 cm     Diastology LVIDs:         2.60 cm     LV e' medial:    6.64 cm/s LV PW:         1.20 cm     LV E/e' medial:  12.8 LV IVS:        1.10 cm     LV e' lateral:   7.40 cm/s LVOT diam:     1.80 cm     LV E/e' lateral: 11.5 LV SV:         25 LV SV Index:   15 LVOT Area:     2.54 cm  LV Volumes (MOD) LV vol d, MOD A2C: 54.6 ml LV vol d, MOD A4C: 88.5 ml LV vol s, MOD A2C: 33.9 ml LV vol s, MOD A4C: 43.8 ml LV SV MOD A2C:     20.7 ml LV SV MOD A4C:     88.5 ml LV SV MOD BP:      33.0 ml  RIGHT VENTRICLE            IVC RV Basal diam:  1.90 cm    IVC diam: 1.60 cm RV S prime:     8.38 cm/s TAPSE (M-mode): 1.4 cm  LEFT ATRIUM             Index        RIGHT ATRIUM          Index LA diam:        3.90 cm 2.24 cm/m   RA Area:     6.04 cm LA Vol (A2C):   32.3 ml 18.56 ml/m  RA Volume:   6.44 ml  3.70 ml/m LA Vol (A4C):   39.9 ml 22.93 ml/m LA Biplane Vol: 39.3 ml 22.58 ml/m AORTIC VALVE LVOT Vmax:   57.90 cm/s LVOT Vmean:  35.760 cm/s LVOT VTI:    0.099 m  AORTA Ao Root diam: 2.50 cm Ao Asc diam:  3.00 cm  MITRAL VALVE  TRICUSPID VALVE MV Area (PHT): 4.24 cm    TR Peak grad:   31.6 mmHg MV Decel Time: 179 msec    TR Vmax:        281.00 cm/s MV E velocity: 84.90 cm/s SHUNTS Systemic VTI:  0.10 m Systemic Diam: 1.80 cm  [name] Electronically signed by [name] Signature Date/Time: 05/30/2023/3:29:34 PM    Final          ______________________________________________________________________________________________      Risk Assessment/Calculations:    CHA2DS2-VASc Score = 6   This indicates a 9.7% annual risk of stroke. The patient's score is based upon: CHF History: 1 HTN History: 1 Diabetes History: 0 Stroke History: 0 Vascular Disease History: 1 Age Score: 2 Gender Score: 1           Physical Exam:   VS:  BP (!) 140/82   Pulse 82   Ht 5' 2.5" (1.588 m)   Wt 153 lb 9.6 oz (69.7 kg)   SpO2 95%   BMI 27.65 kg/m    Wt Readings from Last 3 Encounters:  07/04/23 152 lb (68.9 kg)  07/04/23 153 lb 9.6 oz (69.7 kg)  07/03/23 145 lb 1 oz (65.8 kg)    GEN: Well nourished, well developed in no acute distress NECK: No JVD; No carotid bruits CARDIAC: IRIR, no murmurs, rubs, gallops RESPIRATORY:  Clear to auscultation without rales, wheezing or rhonchi  ABDOMEN: Soft, non-tender, non-distended EXTREMITIES:  No edema; No deformity   ASSESSMENT AND PLAN: .    Atrial fibrillation/hypercoagulable state- Rate controlled. Will plan for cardioversion. OAC was resumed 06/21/23 post ORIF of right ankle. Plan for cardioversion 07/15/23.  CHA2DS2-VASc Score = 6 [CHF History: 1, HTN History: 1, Diabetes History: 0, Stroke History: 0, Vascular Disease History: 1, Age Score: 2, Gender Score: 1].  Therefore, the patient's annual risk of stroke is 9.7 %.    Continue Eliquis 5mg  BID. Denies bleeding complications.   HFmrEF /DOE/LE edema/elevated BNP/Varicose veins / Hyponatremia - hydrochlorothiazide previously stopped due to hyponatremia. Now requiring Lasix 20mg  daily for LE edema, dyspnea in  setting of fluid retention in atrial fibrillation. Echo 05/2023 stable LVEF 45-50%. Anticipate worsened fluid retention due to diastolic dysfunction while in atrial fib. Plan for cardioversion, as above. Continue Lasix 20mg  daily, Losartan 100mg  daily, Propranolol 80mg  daily. Continue compression stockings, low sodium diet. CBC/BMP in 1 week for monitoring of hyponatremia and pre-DCCV labs.  If volume status does not improve post cardioversion, consider transition from Losartan to Entresto vs addition of Spironolactone. SLGT2i previously deferred due to UTI hx.   HTN- BP elevated in setting of volume overload. Continue Lasix 20mg  daily, Losartan 100mg  daily, Propranolol 80mg  daily. Discussed to monitor BP at home at least 2 hours after medications and sitting for 5-10 minutes.   CAD s/p PCI 2008/HLD, LDL goal less than 70 - Stable with no anginal symptoms. No indication for ischemic evaluation.  GDMT Propranolol, Rosuvastatin. No ASA due to [place]. Recommend aiming for 150 minutes of moderate intensity activity per week and following a heart healthy diet.      Informed Consent   Shared Decision Making/Informed Consent The risks (stroke, cardiac arrhythmias rarely resulting in the need for a temporary or permanent pacemaker, skin irritation or burns and complications associated with conscious sedation including aspiration, arrhythmia, respiratory failure and death), benefits (restoration of normal sinus rhythm) and alternatives of a direct current cardioversion were explained in detail to Sarah Hendricks and she agrees to proceed.  Dispo: follow up 2-3 weeks after DCCV  Signed, Alver Sorrow, NP

## 2023-07-04 NOTE — Patient Instructions (Signed)
Medication Instructions:  Your physician recommends that you continue on your current medications as directed. Please refer to the Current Medication list given to you today.   Lab Work: Your physician recommends that you return for lab work: CBC & BMP   Testing/Procedures: Your physician recommends a cardioversion.   Follow-Up: At [facility], you and your health needs are our priority.  As part of our continuing mission to provide you with exceptional heart care, we have created designated Provider Care Teams.  These Care Teams include your primary Cardiologist (physician) and Advanced Practice Providers (APPs -  Physician Assistants and Nurse Practitioners) who all work together to provide you with the care you need, when you need it.  We recommend signing up for the patient portal called "MyChart".  Sign up information is provided on this After Visit Summary.  MyChart is used to connect with patients for Virtual Visits (Telemedicine).  Patients are able to view lab/test results, encounter notes, upcoming appointments, etc.  Non-urgent messages can be sent to your provider as well.   To learn more about what you can do with MyChart, go to [email or web address].    Your next appointment:   July 29, 2023 @ 8:25  Provider:   Gillian Shields, NP    Other Instructions     Dear Sarah Hendricks  You are scheduled for a Cardioversion on Friday, February 28 with Dr. Jacques Navy.  Please arrive at the [facility] (Main Entrance A) at [facility]: [address] [place], [place] [address] at 11:30 AM (This time is 1 hour(s) before your procedure to ensure your preparation).   Free valet parking service is available. You will check in at ADMITTING.   *Please Note: You will receive a call the day before your procedure to confirm the appointment time. That time may have changed from the original time based on the schedule for that day.*    DIET:  Nothing to eat or drink after  midnight except a sip of water with medications (see medication instructions below)  MEDICATION INSTRUCTIONS: !!IF ANY NEW MEDICATIONS ARE STARTED AFTER TODAY, PLEASE NOTIFY YOUR PROVIDER AS SOON AS POSSIBLE!!  FYI: Medications such as Semaglutide (Ozempic, [place]), Tirzepatide (Mounjaro, Zepbound), Dulaglutide (Trulicity), etc ("GLP1 agonists") AND Canagliflozin (Invokana), Dapagliflozin (Farxiga), Empagliflozin (Jardiance), Ertugliflozin (Steglatro), Bexagliflozin [employer]) or any combination with one of these drugs such as Invokamet (Canagliflozin/Metformin), Synjardy (Empagliflozin/Metformin), etc ("SGLT2 inhibitors") must be held around the time of a procedure. This is not a comprehensive list of all of these drugs. Please review all of your medications and talk to your provider if you take any one of these. If you are not sure, ask your provider.     Continue taking your anticoagulant (blood thinner): Apixaban (Eliquis).  You will need to continue this after your procedure until you are told by your provider that it is safe to stop.    LABS: CBC, BMP  FYI:  For your safety, and to allow [place] to monitor your vital signs accurately during the surgery/procedure we request: If you have artificial nails, gel coating, SNS etc, please have those removed prior to your surgery/procedure. Not having the nail coverings /polish removed may result in cancellation or delay of your surgery/procedure.  Your support person will be asked to wait in the waiting room during your procedure.  It is OK to have someone drop you off and come back when you are ready to be discharged.  You cannot drive after the procedure and will  need someone to drive you home.  Bring your insurance cards.  *Special Note: Every effort is made to have your procedure done on time. Occasionally there are emergencies that occur at the hospital that may cause delays. Please be patient if a delay does occur.

## 2023-07-05 ENCOUNTER — Encounter (HOSPITAL_BASED_OUTPATIENT_CLINIC_OR_DEPARTMENT_OTHER): Payer: Self-pay

## 2023-07-05 LAB — CBC
Hematocrit: 32.4 % — ABNORMAL LOW (ref 34.0–46.6)
Hemoglobin: 10.1 g/dL — ABNORMAL LOW (ref 11.1–15.9)
MCH: 28.4 pg (ref 26.6–33.0)
MCHC: 31.2 g/dL — ABNORMAL LOW (ref 31.5–35.7)
MCV: 91 fL (ref 79–97)
Platelets: 364 10*3/uL (ref 150–450)
RBC: 3.56 x10E6/uL — ABNORMAL LOW (ref 3.77–5.28)
RDW: 14.5 % (ref 11.7–15.4)
WBC: 7.8 10*3/uL (ref 3.4–10.8)

## 2023-07-05 LAB — BASIC METABOLIC PANEL
BUN/Creatinine Ratio: 12 (ref 12–28)
BUN: 13 mg/dL (ref 8–27)
CO2: 27 mmol/L (ref 20–29)
Calcium: 9.2 mg/dL (ref 8.7–10.3)
Chloride: 96 mmol/L (ref 96–106)
Creatinine, Ser: 1.05 mg/dL — ABNORMAL HIGH (ref 0.57–1.00)
Glucose: 117 mg/dL — ABNORMAL HIGH (ref 70–99)
Potassium: 3.8 mmol/L (ref 3.5–5.2)
Sodium: 137 mmol/L (ref 134–144)
eGFR: 55 mL/min/{1.73_m2} — ABNORMAL LOW (ref >59)

## 2023-07-06 ENCOUNTER — Encounter (HOSPITAL_BASED_OUTPATIENT_CLINIC_OR_DEPARTMENT_OTHER): Payer: Self-pay | Admitting: Family

## 2023-07-06 ENCOUNTER — Ambulatory Visit: Payer: Medicare Other | Admitting: Internal Medicine

## 2023-07-07 ENCOUNTER — Other Ambulatory Visit (HOSPITAL_BASED_OUTPATIENT_CLINIC_OR_DEPARTMENT_OTHER): Payer: Self-pay

## 2023-07-08 ENCOUNTER — Ambulatory Visit (HOSPITAL_BASED_OUTPATIENT_CLINIC_OR_DEPARTMENT_OTHER): Payer: Medicare Other | Admitting: Cardiology

## 2023-07-08 ENCOUNTER — Telehealth: Payer: Self-pay | Admitting: Radiology

## 2023-07-08 DIAGNOSIS — E559 Vitamin D deficiency, unspecified: Secondary | ICD-10-CM | POA: Diagnosis not present

## 2023-07-08 DIAGNOSIS — I10 Essential (primary) hypertension: Secondary | ICD-10-CM | POA: Diagnosis not present

## 2023-07-08 DIAGNOSIS — E876 Hypokalemia: Secondary | ICD-10-CM | POA: Diagnosis not present

## 2023-07-08 DIAGNOSIS — E785 Hyperlipidemia, unspecified: Secondary | ICD-10-CM | POA: Diagnosis not present

## 2023-07-08 DIAGNOSIS — F419 Anxiety disorder, unspecified: Secondary | ICD-10-CM | POA: Diagnosis not present

## 2023-07-08 DIAGNOSIS — E871 Hypo-osmolality and hyponatremia: Secondary | ICD-10-CM | POA: Diagnosis not present

## 2023-07-08 DIAGNOSIS — F32A Depression, unspecified: Secondary | ICD-10-CM | POA: Diagnosis not present

## 2023-07-08 DIAGNOSIS — M810 Age-related osteoporosis without current pathological fracture: Secondary | ICD-10-CM | POA: Diagnosis not present

## 2023-07-08 DIAGNOSIS — K219 Gastro-esophageal reflux disease without esophagitis: Secondary | ICD-10-CM | POA: Diagnosis not present

## 2023-07-08 DIAGNOSIS — I2510 Atherosclerotic heart disease of native coronary artery without angina pectoris: Secondary | ICD-10-CM | POA: Diagnosis not present

## 2023-07-08 DIAGNOSIS — I480 Paroxysmal atrial fibrillation: Secondary | ICD-10-CM | POA: Diagnosis not present

## 2023-07-08 DIAGNOSIS — Z791 Long term (current) use of non-steroidal anti-inflammatories (NSAID): Secondary | ICD-10-CM | POA: Diagnosis not present

## 2023-07-08 DIAGNOSIS — S82891D Other fracture of right lower leg, subsequent encounter for closed fracture with routine healing: Secondary | ICD-10-CM | POA: Diagnosis not present

## 2023-07-08 DIAGNOSIS — R7303 Prediabetes: Secondary | ICD-10-CM | POA: Diagnosis not present

## 2023-07-08 DIAGNOSIS — I252 Old myocardial infarction: Secondary | ICD-10-CM | POA: Diagnosis not present

## 2023-07-08 DIAGNOSIS — M1990 Unspecified osteoarthritis, unspecified site: Secondary | ICD-10-CM | POA: Diagnosis not present

## 2023-07-08 NOTE — Telephone Encounter (Signed)
Irving Burton with [facility] requests call with specifics in regards to what she can do with physical therapy. I advised , per last note, increase weightbearing and that she may come out of the boot if comfortable. She is asking that you give her more specifics in regards to what she needs to work on. She is currently with the patient.  CB for Irving Burton [phone]

## 2023-07-12 ENCOUNTER — Ambulatory Visit: Payer: Medicare Other | Admitting: Sports Medicine

## 2023-07-12 ENCOUNTER — Ambulatory Visit: Payer: Medicare Other | Admitting: Internal Medicine

## 2023-07-13 ENCOUNTER — Telehealth: Payer: Self-pay | Admitting: Internal Medicine

## 2023-07-13 ENCOUNTER — Encounter (HOSPITAL_BASED_OUTPATIENT_CLINIC_OR_DEPARTMENT_OTHER): Payer: Self-pay

## 2023-07-13 DIAGNOSIS — R7303 Prediabetes: Secondary | ICD-10-CM | POA: Diagnosis not present

## 2023-07-13 DIAGNOSIS — E871 Hypo-osmolality and hyponatremia: Secondary | ICD-10-CM | POA: Diagnosis not present

## 2023-07-13 DIAGNOSIS — M1990 Unspecified osteoarthritis, unspecified site: Secondary | ICD-10-CM | POA: Diagnosis not present

## 2023-07-13 DIAGNOSIS — F32A Depression, unspecified: Secondary | ICD-10-CM | POA: Diagnosis not present

## 2023-07-13 DIAGNOSIS — K219 Gastro-esophageal reflux disease without esophagitis: Secondary | ICD-10-CM | POA: Diagnosis not present

## 2023-07-13 DIAGNOSIS — E559 Vitamin D deficiency, unspecified: Secondary | ICD-10-CM | POA: Diagnosis not present

## 2023-07-13 DIAGNOSIS — I252 Old myocardial infarction: Secondary | ICD-10-CM | POA: Diagnosis not present

## 2023-07-13 DIAGNOSIS — I10 Essential (primary) hypertension: Secondary | ICD-10-CM | POA: Diagnosis not present

## 2023-07-13 DIAGNOSIS — I480 Paroxysmal atrial fibrillation: Secondary | ICD-10-CM | POA: Diagnosis not present

## 2023-07-13 DIAGNOSIS — E876 Hypokalemia: Secondary | ICD-10-CM | POA: Diagnosis not present

## 2023-07-13 DIAGNOSIS — E785 Hyperlipidemia, unspecified: Secondary | ICD-10-CM | POA: Diagnosis not present

## 2023-07-13 DIAGNOSIS — I2510 Atherosclerotic heart disease of native coronary artery without angina pectoris: Secondary | ICD-10-CM | POA: Diagnosis not present

## 2023-07-13 DIAGNOSIS — M810 Age-related osteoporosis without current pathological fracture: Secondary | ICD-10-CM | POA: Diagnosis not present

## 2023-07-13 DIAGNOSIS — S82891D Other fracture of right lower leg, subsequent encounter for closed fracture with routine healing: Secondary | ICD-10-CM | POA: Diagnosis not present

## 2023-07-13 DIAGNOSIS — F419 Anxiety disorder, unspecified: Secondary | ICD-10-CM | POA: Diagnosis not present

## 2023-07-13 DIAGNOSIS — Z791 Long term (current) use of non-steroidal anti-inflammatories (NSAID): Secondary | ICD-10-CM | POA: Diagnosis not present

## 2023-07-13 NOTE — Telephone Encounter (Signed)
 Ok for verbal orders ?

## 2023-07-13 NOTE — Telephone Encounter (Signed)
 Copied from CRM [id number]. Topic: Clinical - Home Health Verbal Orders >> Jul 13, 2023  9:27 AM Fredrich Romans wrote: Caller/Agency: [facility] Callback Number: [phone] Service Requested: Physical Therapy Frequency: 1wk 2 Any new concerns about the patient? No

## 2023-07-13 NOTE — Telephone Encounter (Signed)
 Okay for orders?

## 2023-07-14 NOTE — OR Nursing (Signed)
 Called patient with pre-procedure instructions for tomorrow.   Patient informed of:   Time to arrive for procedure. 1130 Remain NPO past midnight.  Must have a ride home and a responsible adult to remain with them for 24 hours post procedure.  Confirmed blood thinner. Confirmed no breaks in taking blood thinner for 3+ weeks prior to procedure. Confirmed patient stopped all GLP-1s and GLP-2s for at least one week before procedure.

## 2023-07-14 NOTE — Telephone Encounter (Signed)
 Called and gave ok for verbal orders to [facility]

## 2023-07-15 ENCOUNTER — Ambulatory Visit (HOSPITAL_COMMUNITY): Payer: Medicare Other | Admitting: Anesthesiology

## 2023-07-15 ENCOUNTER — Other Ambulatory Visit: Payer: Self-pay

## 2023-07-15 ENCOUNTER — Encounter (HOSPITAL_COMMUNITY): Payer: Self-pay | Admitting: Internal Medicine

## 2023-07-15 ENCOUNTER — Ambulatory Visit (HOSPITAL_COMMUNITY)
Admission: RE | Admit: 2023-07-15 | Discharge: 2023-07-15 | Disposition: A | Discharge status: Home / Self Care | Payer: Medicare Other | Attending: Internal Medicine | Admitting: Internal Medicine

## 2023-07-15 ENCOUNTER — Encounter (HOSPITAL_COMMUNITY)
Admission: RE | Disposition: A | Discharge status: Home / Self Care | Payer: Self-pay | Source: Home / Self Care | Attending: Internal Medicine

## 2023-07-15 DIAGNOSIS — Z7901 Long term (current) use of anticoagulants: Secondary | ICD-10-CM | POA: Insufficient documentation

## 2023-07-15 DIAGNOSIS — I10 Essential (primary) hypertension: Secondary | ICD-10-CM

## 2023-07-15 DIAGNOSIS — I4891 Unspecified atrial fibrillation: Secondary | ICD-10-CM

## 2023-07-15 DIAGNOSIS — Z955 Presence of coronary angioplasty implant and graft: Secondary | ICD-10-CM | POA: Diagnosis not present

## 2023-07-15 DIAGNOSIS — I252 Old myocardial infarction: Secondary | ICD-10-CM | POA: Insufficient documentation

## 2023-07-15 DIAGNOSIS — D6859 Other primary thrombophilia: Secondary | ICD-10-CM | POA: Insufficient documentation

## 2023-07-15 DIAGNOSIS — I5022 Chronic systolic (congestive) heart failure: Secondary | ICD-10-CM | POA: Insufficient documentation

## 2023-07-15 DIAGNOSIS — K219 Gastro-esophageal reflux disease without esophagitis: Secondary | ICD-10-CM | POA: Insufficient documentation

## 2023-07-15 DIAGNOSIS — Z79899 Other long term (current) drug therapy: Secondary | ICD-10-CM | POA: Diagnosis not present

## 2023-07-15 DIAGNOSIS — M1990 Unspecified osteoarthritis, unspecified site: Secondary | ICD-10-CM | POA: Diagnosis not present

## 2023-07-15 DIAGNOSIS — I480 Paroxysmal atrial fibrillation: Secondary | ICD-10-CM

## 2023-07-15 DIAGNOSIS — F419 Anxiety disorder, unspecified: Secondary | ICD-10-CM | POA: Insufficient documentation

## 2023-07-15 DIAGNOSIS — E785 Hyperlipidemia, unspecified: Secondary | ICD-10-CM | POA: Diagnosis not present

## 2023-07-15 DIAGNOSIS — Z853 Personal history of malignant neoplasm of breast: Secondary | ICD-10-CM | POA: Insufficient documentation

## 2023-07-15 DIAGNOSIS — I110 Hypertensive heart disease with heart failure: Secondary | ICD-10-CM | POA: Diagnosis not present

## 2023-07-15 DIAGNOSIS — I2510 Atherosclerotic heart disease of native coronary artery without angina pectoris: Secondary | ICD-10-CM | POA: Diagnosis not present

## 2023-07-15 HISTORY — PX: CARDIOVERSION: EP1203

## 2023-07-15 SURGERY — CARDIOVERSION (CATH LAB)
Anesthesia: Monitor Anesthesia Care

## 2023-07-15 MED ORDER — SODIUM CHLORIDE 0.9 % IV SOLN: INTRAVENOUS | Status: DC

## 2023-07-15 MED ORDER — PROPOFOL 10 MG/ML IV BOLUS
INTRAVENOUS | Status: DC | PRN
  Administered 2023-07-15: 60 mg via INTRAVENOUS

## 2023-07-15 MED ORDER — LIDOCAINE HCL (CARDIAC) PF 100 MG/5ML IV SOSY
PREFILLED_SYRINGE | INTRAVENOUS | Status: DC | PRN
  Administered 2023-07-15: 60 mg via INTRATRACHEAL

## 2023-07-15 SURGICAL SUPPLY — 1 items: PAD DEFIB RADIO PHYSIO CONN (PAD) ×1 IMPLANT

## 2023-07-15 NOTE — Anesthesia Postprocedure Evaluation (Signed)
 Anesthesia Post Note  Patient: Sarah Hendricks  Procedure(s) Performed: CARDIOVERSION     Patient location during evaluation: PACU Anesthesia Type: General Level of consciousness: awake and alert Pain management: pain level controlled Vital Signs Assessment: post-procedure vital signs reviewed and stable Respiratory status: spontaneous breathing, nonlabored ventilation, respiratory function stable and patient connected to nasal cannula oxygen Cardiovascular status: blood pressure returned to baseline and stable Postop Assessment: no apparent nausea or vomiting Anesthetic complications: no   No notable events documented.  Last Vitals:  Vitals:   07/15/23 1410 07/15/23 1420  BP: 116/84 (!) 143/82  Pulse: 71 73  Resp: 13 20  Temp:    SpO2: 99% 100%    Last Pain:  Vitals:   07/15/23 1402  TempSrc: Temporal  PainSc: 0-No pain                 Earl Lites P Doraine Schexnider

## 2023-07-15 NOTE — CV Procedure (Signed)
 Procedure: Electrical Cardioversion Indications:  Atrial Fibrillation  Procedure Details:  Consent: Risks of procedure as well as the alternatives and risks of each were explained to the (patient/caregiver).  Consent for procedure obtained.  Time Out: Verified patient identification, verified procedure, site/side was marked, verified correct patient position, special equipment/implants available, medications/allergies/relevent history reviewed, required imaging and test results available. PERFORMED.  Patient placed on cardiac monitor, pulse oximetry, supplemental oxygen as necessary.  Sedation given:  propofol per anesthesia Pacer pads placed anterior chest.  Cardioverted 1 time(s).  Cardioversion with synchronized biphasic 200J shock.  Evaluation: Findings: Post procedure EKG shows: NSR Complications: None Patient did tolerate procedure well.  Time Spent Directly with the Patient:  15 minutes   Parke Poisson 07/15/2023, 1:51 PM

## 2023-07-15 NOTE — Transfer of Care (Signed)
 Immediate Anesthesia Transfer of Care Note  Patient: Sarah Hendricks  Procedure(s) Performed: CARDIOVERSION  Patient Location: PACU  Anesthesia Type:MAC  Level of Consciousness: awake and drowsy  Airway & Oxygen Therapy: Patient Spontanous Breathing and Patient connected to nasal cannula oxygen  Post-op Assessment: Report given to RN and Post -op Vital signs reviewed and stable  Post vital signs: Reviewed and stable  Last Vitals:  Vitals Value Taken Time  BP 119/61 07/15/23 1351  Temp    Pulse 65 07/15/23 1353  Resp 14 07/15/23 1353  SpO2 99 % 07/15/23 1353  Vitals shown include unfiled device data.  Last Pain:  Vitals:   07/15/23 1144  TempSrc:   PainSc: 0-No pain         Complications: No notable events documented.

## 2023-07-15 NOTE — Interval H&P Note (Signed)
 History and Physical Interval Note:  07/15/2023 1:41 PM  Sarah Hendricks  has presented today for surgery, with the diagnosis of AFIB.  The various methods of treatment have been discussed with the patient and family. After consideration of risks, benefits and other options for treatment, the patient has consented to  Procedure(s): CARDIOVERSION (N/A) as a surgical intervention.  The patient's history has been reviewed, patient examined, no change in status, stable for surgery.  I have reviewed the patient's chart and labs.  Questions were answered to the patient's satisfaction.     Parke Poisson

## 2023-07-15 NOTE — Anesthesia Preprocedure Evaluation (Addendum)
 Anesthesia Evaluation  Patient identified by MRN, date of birth, ID band Patient awake    Reviewed: Allergy & Precautions, NPO status , Patient's Chart, lab work & pertinent test results, reviewed documented beta blocker date and time   Airway Mallampati: II  TM Distance: >3 FB Neck ROM: Full    Dental no notable dental hx. (+) Dental Advisory Given, Teeth Intact   Pulmonary neg pulmonary ROS   Pulmonary exam normal breath sounds clear to auscultation       Cardiovascular hypertension, Pt. on home beta blockers and Pt. on medications + CAD, + Past MI (in 2008) and + Cardiac Stents  Normal cardiovascular exam+ dysrhythmias Atrial Fibrillation  Rhythm:Irregular Rate:Normal  Echo 05/2023  1. Unchanged apical akinesis. Left ventricular ejection fraction, by estimation, is 45 to 50%. The left ventricle has mildly decreased function. There is mild concentric left ventricular hypertrophy. Left ventricular diastolic parameters are indeterminate.   2. Right ventricular systolic function is normal. The right ventricular size is normal.   3. Left atrial size was moderately dilated.   4. The mitral valve is normal in structure. No evidence of mitral valve regurgitation.   5. The aortic valve is tricuspid. Aortic valve regurgitation is not visualized.   6. The inferior vena cava is normal in size with greater than 50% respiratory variability, suggesting right atrial pressure of 3 mmHg.     Neuro/Psych  PSYCHIATRIC DISORDERS Anxiety     negative neurological ROS     GI/Hepatic ,GERD  Controlled and Medicated,,  Endo/Other  negative endocrine ROS    Renal/GU      Musculoskeletal  (+) Arthritis ,    Abdominal   Peds  Hematology   Anesthesia Other Findings Breast CA  Reproductive/Obstetrics                              Anesthesia Physical Anesthesia Plan  ASA: 3  Anesthesia Plan: MAC   Post-op Pain  Management: Minimal or no pain anticipated   Induction:   PONV Risk Score and Plan: 2 and Propofol infusion, Treatment may vary due to age or medical condition and TIVA  Airway Management Planned: Simple Face Mask  Additional Equipment: None  Intra-op Plan:   Post-operative Plan:   Informed Consent: I have reviewed the patients History and Physical, chart, labs and discussed the procedure including the risks, benefits and alternatives for the proposed anesthesia with the patient or authorized representative who has indicated his/her understanding and acceptance.     Dental advisory given  Plan Discussed with: CRNA  Anesthesia Plan Comments:          Anesthesia Quick Evaluation

## 2023-07-16 ENCOUNTER — Encounter: Payer: Self-pay | Admitting: Internal Medicine

## 2023-07-18 ENCOUNTER — Other Ambulatory Visit (HOSPITAL_BASED_OUTPATIENT_CLINIC_OR_DEPARTMENT_OTHER): Payer: Self-pay

## 2023-07-18 ENCOUNTER — Other Ambulatory Visit (HOSPITAL_BASED_OUTPATIENT_CLINIC_OR_DEPARTMENT_OTHER): Payer: Self-pay | Admitting: Orthopaedic Surgery

## 2023-07-18 ENCOUNTER — Ambulatory Visit: Payer: Medicare Other | Admitting: Sports Medicine

## 2023-07-18 MED ORDER — TRAMADOL HCL 50 MG PO TABS: 50.0000 mg | ORAL_TABLET | Freq: Four times a day (QID) | ORAL | 0 refills | Status: DC | PRN

## 2023-07-19 DIAGNOSIS — K219 Gastro-esophageal reflux disease without esophagitis: Secondary | ICD-10-CM | POA: Diagnosis not present

## 2023-07-19 DIAGNOSIS — F32A Depression, unspecified: Secondary | ICD-10-CM | POA: Diagnosis not present

## 2023-07-19 DIAGNOSIS — I252 Old myocardial infarction: Secondary | ICD-10-CM | POA: Diagnosis not present

## 2023-07-19 DIAGNOSIS — I2510 Atherosclerotic heart disease of native coronary artery without angina pectoris: Secondary | ICD-10-CM | POA: Diagnosis not present

## 2023-07-19 DIAGNOSIS — F419 Anxiety disorder, unspecified: Secondary | ICD-10-CM | POA: Diagnosis not present

## 2023-07-19 DIAGNOSIS — Z791 Long term (current) use of non-steroidal anti-inflammatories (NSAID): Secondary | ICD-10-CM | POA: Diagnosis not present

## 2023-07-19 DIAGNOSIS — E876 Hypokalemia: Secondary | ICD-10-CM | POA: Diagnosis not present

## 2023-07-19 DIAGNOSIS — E871 Hypo-osmolality and hyponatremia: Secondary | ICD-10-CM | POA: Diagnosis not present

## 2023-07-19 DIAGNOSIS — I10 Essential (primary) hypertension: Secondary | ICD-10-CM | POA: Diagnosis not present

## 2023-07-19 DIAGNOSIS — M1990 Unspecified osteoarthritis, unspecified site: Secondary | ICD-10-CM | POA: Diagnosis not present

## 2023-07-19 DIAGNOSIS — I480 Paroxysmal atrial fibrillation: Secondary | ICD-10-CM | POA: Diagnosis not present

## 2023-07-19 DIAGNOSIS — S82891D Other fracture of right lower leg, subsequent encounter for closed fracture with routine healing: Secondary | ICD-10-CM | POA: Diagnosis not present

## 2023-07-19 DIAGNOSIS — E559 Vitamin D deficiency, unspecified: Secondary | ICD-10-CM | POA: Diagnosis not present

## 2023-07-19 DIAGNOSIS — E785 Hyperlipidemia, unspecified: Secondary | ICD-10-CM | POA: Diagnosis not present

## 2023-07-19 DIAGNOSIS — R7303 Prediabetes: Secondary | ICD-10-CM | POA: Diagnosis not present

## 2023-07-19 DIAGNOSIS — M810 Age-related osteoporosis without current pathological fracture: Secondary | ICD-10-CM | POA: Diagnosis not present

## 2023-07-20 ENCOUNTER — Other Ambulatory Visit: Payer: Self-pay | Admitting: Internal Medicine

## 2023-07-25 ENCOUNTER — Encounter: Payer: Self-pay | Admitting: Internal Medicine

## 2023-07-27 DIAGNOSIS — E785 Hyperlipidemia, unspecified: Secondary | ICD-10-CM | POA: Diagnosis not present

## 2023-07-27 DIAGNOSIS — E876 Hypokalemia: Secondary | ICD-10-CM | POA: Diagnosis not present

## 2023-07-27 DIAGNOSIS — I10 Essential (primary) hypertension: Secondary | ICD-10-CM | POA: Diagnosis not present

## 2023-07-27 DIAGNOSIS — R7303 Prediabetes: Secondary | ICD-10-CM | POA: Diagnosis not present

## 2023-07-27 DIAGNOSIS — M810 Age-related osteoporosis without current pathological fracture: Secondary | ICD-10-CM | POA: Diagnosis not present

## 2023-07-27 DIAGNOSIS — F32A Depression, unspecified: Secondary | ICD-10-CM | POA: Diagnosis not present

## 2023-07-27 DIAGNOSIS — I2510 Atherosclerotic heart disease of native coronary artery without angina pectoris: Secondary | ICD-10-CM | POA: Diagnosis not present

## 2023-07-27 DIAGNOSIS — Z791 Long term (current) use of non-steroidal anti-inflammatories (NSAID): Secondary | ICD-10-CM | POA: Diagnosis not present

## 2023-07-27 DIAGNOSIS — K219 Gastro-esophageal reflux disease without esophagitis: Secondary | ICD-10-CM | POA: Diagnosis not present

## 2023-07-27 DIAGNOSIS — F419 Anxiety disorder, unspecified: Secondary | ICD-10-CM | POA: Diagnosis not present

## 2023-07-27 DIAGNOSIS — M1990 Unspecified osteoarthritis, unspecified site: Secondary | ICD-10-CM | POA: Diagnosis not present

## 2023-07-27 DIAGNOSIS — E871 Hypo-osmolality and hyponatremia: Secondary | ICD-10-CM | POA: Diagnosis not present

## 2023-07-27 DIAGNOSIS — I480 Paroxysmal atrial fibrillation: Secondary | ICD-10-CM | POA: Diagnosis not present

## 2023-07-27 DIAGNOSIS — E559 Vitamin D deficiency, unspecified: Secondary | ICD-10-CM | POA: Diagnosis not present

## 2023-07-27 DIAGNOSIS — S82891D Other fracture of right lower leg, subsequent encounter for closed fracture with routine healing: Secondary | ICD-10-CM | POA: Diagnosis not present

## 2023-07-27 DIAGNOSIS — I252 Old myocardial infarction: Secondary | ICD-10-CM | POA: Diagnosis not present

## 2023-07-28 ENCOUNTER — Ambulatory Visit: Payer: Medicare Other | Admitting: Internal Medicine

## 2023-07-29 ENCOUNTER — Ambulatory Visit (HOSPITAL_BASED_OUTPATIENT_CLINIC_OR_DEPARTMENT_OTHER)

## 2023-07-29 ENCOUNTER — Other Ambulatory Visit (HOSPITAL_BASED_OUTPATIENT_CLINIC_OR_DEPARTMENT_OTHER): Payer: Self-pay

## 2023-07-29 ENCOUNTER — Encounter (HOSPITAL_BASED_OUTPATIENT_CLINIC_OR_DEPARTMENT_OTHER): Payer: Self-pay | Admitting: Family

## 2023-07-29 ENCOUNTER — Ambulatory Visit (HOSPITAL_BASED_OUTPATIENT_CLINIC_OR_DEPARTMENT_OTHER): Payer: Medicare Other | Admitting: Orthopaedic Surgery

## 2023-07-29 ENCOUNTER — Ambulatory Visit (INDEPENDENT_AMBULATORY_CARE_PROVIDER_SITE_OTHER): Payer: Medicare Other | Admitting: Family

## 2023-07-29 VITALS — BP 122/64 | HR 68 | Ht 63.0 in | Wt 144.3 lb

## 2023-07-29 DIAGNOSIS — S82841A Displaced bimalleolar fracture of right lower leg, initial encounter for closed fracture: Secondary | ICD-10-CM

## 2023-07-29 DIAGNOSIS — M25471 Effusion, right ankle: Secondary | ICD-10-CM | POA: Diagnosis not present

## 2023-07-29 DIAGNOSIS — D6859 Other primary thrombophilia: Secondary | ICD-10-CM | POA: Diagnosis not present

## 2023-07-29 DIAGNOSIS — E871 Hypo-osmolality and hyponatremia: Secondary | ICD-10-CM

## 2023-07-29 DIAGNOSIS — I2510 Atherosclerotic heart disease of native coronary artery without angina pectoris: Secondary | ICD-10-CM

## 2023-07-29 DIAGNOSIS — E785 Hyperlipidemia, unspecified: Secondary | ICD-10-CM

## 2023-07-29 DIAGNOSIS — Z4789 Encounter for other orthopedic aftercare: Secondary | ICD-10-CM | POA: Diagnosis not present

## 2023-07-29 DIAGNOSIS — I480 Paroxysmal atrial fibrillation: Secondary | ICD-10-CM

## 2023-07-29 NOTE — Progress Notes (Signed)
 Cardiology Office Note:  .   Date:  07/29/2023  ID:  Sarah Hendricks, DOB Mar 06, 1946, MRN [id number] PCP: Pincus Sanes, MD  [facility] Providers Cardiologist:  Jodelle Red, MD    History of Present Illness: .   Sarah Hendricks is a 78 y.o. female with hx of atrial fibrillation, HFrEF, tremor, CAD s/p PCI 2008, HTN, HLD, hyponatremia  SGLT2i deferred due to prior UTI and yeast infection.  Admitted 05/2023 after fall in her kitchen with lightheadedness. New onset atrial fibrillation. Echo 05/30/23 LVEF 45-50%, mild LVH, RV normal. LVEF similar to prior echo 11/2022.She was seen by ortho for right ankle fracture. Underwent right ORIF of ankle 06/20/23.  ED visit 07/03/2023 with exertional dyspnea, lower extremity edema.  D-dimer elevated, CTA with no PE.  BNP 386.  EKG rate controlled atrial fibrillation.  Lower extremity duplex with no DVT.  She was given 40 mg IV Lasix and discharged on Lasix 20 mg daily.  Last seen 07/04/23 and set up for cardioversion which was performed 07/15/23.   Presents today for follow up. Endorses feeling much better since returning to normal sinus rhythm with improved energy level. Her swelling has significantly improved with only mild edema at her right ankle at prior surgical site. Reports no chest pain and improvement in her breathing.   ROS: Please see the history of present illness.    All other systems reviewed and are negative.   Studies Reviewed: Marland Kitchen   EKG Interpretation Date/Time:  Friday July 29 2023 08:30:12 EDT Ventricular Rate:  69 PR Interval:  152 QRS Duration:  82 QT Interval:  390 QTC Calculation: 417 R Axis:   9  Text Interpretation: Normal sinus rhythm Minimal voltage criteria for LVH, may be normal variant ( R in aVL ) No acute ST/T wave changes. Confirmed by Gillian Shields ([id number]) on 07/29/2023 8:37:28 AM       Risk Assessment/Calculations:    CHA2DS2-VASc Score = 6   This indicates a 9.7% annual risk of stroke. The  patient's score is based upon: CHF History: 1 HTN History: 1 Diabetes History: 0 Stroke History: 0 Vascular Disease History: 1 Age Score: 2 Gender Score: 1           Physical Exam:   VS:  BP 122/64   Pulse 68   Ht 5\' 3"  (1.6 m)   Wt 144 lb 4.8 oz (65.5 kg)   SpO2 96%   BMI 25.56 kg/m    Wt Readings from Last 3 Encounters:  07/29/23 144 lb 4.8 oz (65.5 kg)  07/15/23 145 lb (65.8 kg)  07/04/23 152 lb (68.9 kg)    GEN: Well nourished, well developed in no acute distress NECK: No JVD; No carotid bruits CARDIAC: IRIR, no murmurs, rubs, gallops RESPIRATORY:  Clear to auscultation without rales, wheezing or rhonchi  ABDOMEN: Soft, non-tender, non-distended EXTREMITIES:  No edema; No deformity   ASSESSMENT AND PLAN: .    Atrial fibrillation/hypercoagulable state- Maintaining NSR by EKG today. Energy level, edema much improved. Continue Propranolol 80mg  daily, Eliquis 5mg  BID. CHA2DS2-VASc Score = 6 [CHF History: 1, HTN History: 1, Diabetes History: 0, Stroke History: 0, Vascular Disease History: 1, Age Score: 2, Gender Score: 1].  Therefore, the patient's annual risk of stroke is 9.7 %.    Denies bleeding complications. CBC today.  Of note, given cardioversion 07/15/23 recommend no surgical procedures requiring Eliquis hold for at least 4 weeks after cardioversion.   HFmrEF /DOE/LE edema/elevated BNP/Varicose veins / Hyponatremia - hydrochlorothiazide  previously stopped due to hyponatremia.  Echo 05/2023 stable LVEF 45-50%. Volume status and dyspnea much improved post cardioversion.Update BMP/BNP today to reassess sodium level, kidney function, volume status. If volume status improved, consider reducing Lasix to three times per week.   HTN- BP well controlled. Continue current antihypertensive regimen Losartan 100mg  daily, Propranolol 80mg  daily .   CAD s/p PCI 2008/HLD, LDL goal less than 70 - Stable with no anginal symptoms. No indication for ischemic evaluation.  GDMT includes  Propranolol 80mg  daily, Crestor 10mg  daily. No ASA due to [facility]. Recommend aiming for 150 minutes of moderate intensity activity per week and following a heart healthy diet.       Dispo: follow up in June with Dr. Cristal Deer as scheduled.   Signed, Alver Sorrow, NP

## 2023-07-29 NOTE — Progress Notes (Signed)
 Post Operative Evaluation    Procedure/Date of Surgery: Right ankle open reduction internal fixation 2/3  Interval History:   Presents today 6 weeks status post right ankle open duction internal fixation overall doing well.  She is continuing to improve.  She has having some swelling as she has been more active although she is back to a normal shoe now.  PMH/PSH/Family History/Social History/Meds/Allergies:    Past Medical History:  Diagnosis Date   Allergy    Anxiety    Arthritis    Bimalleolar ankle fracture, right, closed, initial encounter    Breast cancer (HCC) 1994   lumpectomy and XRT, no chemo   CAD (coronary artery disease) 2008   PTCA, in [place]   GERD (gastroesophageal reflux disease)    History of chicken pox    Hyperlipidemia    Hypertension    Myocardial infarction (HCC) 03/15/2007   stent placed   Vitamin D deficiency    Past Surgical History:  Procedure Laterality Date   BREAST BIOPSY Left 1994   BREAST LUMPECTOMY Left 1994   CARDIOVERSION N/A 07/15/2023   Procedure: CARDIOVERSION;  Surgeon: Parke Poisson, MD;  Location: [facility];  Service: Cardiovascular;  Laterality: N/A;   CATARACT EXTRACTION, BILATERAL     CORONARY ANGIOPLASTY WITH STENT PLACEMENT  2008   CRANIECTOMY FOR DEPRESSED SKULL FRACTURE  1986   ORIF ANKLE FRACTURE Right 06/20/2023   Procedure: RIGHT OPEN REDUCTION INTERNAL FIXATION (ORIF) ANKLE FRACTURE;  Surgeon: Huel Cote, MD;  Location: [facility];  Service: Orthopedics;  Laterality: Right;   TONSILLECTOMY  1966   Social History   Socioeconomic History   Marital status: Married    Spouse name: Not on file   Number of children: Not on file   Years of education: Not on file   Highest education level: Not on file  Occupational History   Not on file  Tobacco Use   Smoking status: Never   Smokeless tobacco: Never  Vaping Use   Vaping status: Never Used  Substance and  Sexual Activity   Alcohol use: No   Drug use: No   Sexual activity: Not Currently    Birth control/protection: Post-menopausal  Other Topics Concern   Not on file  Social History Narrative   Married, lives with spouse   Retired from [employer] irregularly, rides an exercise bike   Social Drivers of Health   Financial Resource Strain: Low Risk  (09/23/2022)   Overall Financial Resource Strain (CARDIA)    Difficulty of Paying Living Expenses: Not hard at all  Food Insecurity: No Food Insecurity (05/30/2023)   Hunger Vital Sign    Worried About Running Out of Food in the Last Year: Never true    Ran Out of Food in the Last Year: Never true  Transportation Needs: No Transportation Needs (05/30/2023)   PRAPARE - Administrator, Civil Service (Medical): No    Lack of Transportation (Non-Medical): No  Physical Activity: Sufficiently Active (09/23/2022)   Exercise Vital Sign    Days of Exercise per Week: 5 days    Minutes of Exercise per Session: 30 min  Stress: No Stress Concern Present (09/23/2022)   [employer] of Occupational Health - Occupational Stress Questionnaire    Feeling of Stress :  Not at all  Social Connections: Socially Integrated (05/30/2023)   Social Connection and Isolation Panel [NHANES]    Frequency of Communication with Friends and Family: More than three times a week    Frequency of Social Gatherings with Friends and Family: More than three times a week    Attends Religious Services: More than 4 times per year    Active Member of [employer] or Organizations: No    Attends Engineer, structural: More than 4 times per year    Marital Status: Married   Family History  Problem Relation Age of Onset   Breast cancer Mother    Hypertension Mother    Heart disease Mother    Stroke Mother    Arthritis Father    Heart disease Father        died at 69 of heart attack   Heart disease Brother    Stroke Maternal Grandmother    Heart disease  Maternal Grandfather    Colon cancer Neg Hx    Esophageal cancer Neg Hx    Rectal cancer Neg Hx    Stomach cancer Neg Hx    Allergies  Allergen Reactions   Acetaminophen-Codeine Other (See Comments)    Bad dreams   Tigan [Trimethobenzamide] Other (See Comments)    Bad dreams   Alendronate     Caused leg pains    Atorvastatin Other (See Comments)    Pt reports "causes bilateral leg pains."   Codeine     Bad dreams   Simvastatin Other (See Comments)    Leg cramps   Current Outpatient Medications  Medication Sig Dispense Refill   apixaban (ELIQUIS) 5 MG TABS tablet Take 1 tablet (5 mg total) by mouth 2 (two) times daily. 180 tablet 1   Cholecalciferol (EQL VITAMIN D3) 1000 units tablet Take 1 tablet (1,000 Units total) by mouth daily.     escitalopram (LEXAPRO) 10 MG tablet TAKE 1 TABLET(10 MG) BY MOUTH DAILY 90 tablet 1   furosemide (LASIX) 20 MG tablet Take 1 tablet (20 mg total) by mouth daily. 30 tablet 11   losartan (COZAAR) 100 MG tablet TAKE 1 TABLET(100 MG) BY MOUTH DAILY 90 tablet 3   propranolol (INDERAL) 80 MG tablet Take 1 tablet (80 mg total) by mouth 2 (two) times daily. 180 tablet 3   rosuvastatin (CRESTOR) 10 MG tablet Take 1 tablet (10 mg total) by mouth daily. 90 tablet 3   traMADol (ULTRAM) 50 MG tablet Take 1 tablet (50 mg total) by mouth every 6 (six) hours as needed. 30 tablet 0   No current facility-administered medications for this visit.   No results found.  Review of Systems:   A ROS was performed including pertinent positives and negatives as documented in the HPI.   Musculoskeletal Exam:    There were no vitals taken for this visit.  Right ankle incision is well-appearing without erythema or drainage.  She has 10 degrees dorsi and plantarflexion of the right foot.  Sensation is intact all distributions of the right foot with 2+ dorsalis pedis pulse  Imaging:    3 views right ankle: Status post fibular fixation without evidence  complication  I personally reviewed and interpreted the radiographs.   Assessment:   6 weeks status post right ankle fixation overall doing very well.  Overall she is continuing to improve nicely.  She is having some swelling as she is more active.  I did counsel her that this is typical during an ankle recovery.  I will plan to see her back in 6 weeks for reassessment  Plan :    -Return to clinic 6 weeks for reassessment      I personally saw and evaluated the patient, and participated in the management and treatment plan.  Huel Cote, MD Attending Physician, Orthopedic Surgery  This document was dictated using Dragon voice recognition software. A reasonable attempt at proof reading has been made to minimize errors.

## 2023-07-29 NOTE — Patient Instructions (Signed)
 Medication Instructions:  Continue your current medications.   *If you need a refill on your cardiac medications before your next appointment, please call your pharmacy*   Lab Work: Your physician recommends that you return for lab work today: BMP, BNP, CBC  If you have labs (blood work) drawn today and your tests are completely normal, you will receive your results only by: MyChart Message (if you have MyChart) OR A paper copy in the mail If you have any lab test that is abnormal or we need to change your treatment, we will call you to review the results.   Testing/Procedures: Your EKG today shows normal rhythm!  Follow-Up: At [facility], you and your health needs are our priority.  As part of our continuing mission to provide you with exceptional heart care, we have created designated Provider Care Teams.  These Care Teams include your primary Cardiologist (physician) and Advanced Practice Providers (APPs -  Physician Assistants and Nurse Practitioners) who all work together to provide you with the care you need, when you need it.  We recommend signing up for the patient portal called "MyChart".  Sign up information is provided on this After Visit Summary.  MyChart is used to connect with patients for Virtual Visits (Telemedicine).  Patients are able to view lab/test results, encounter notes, upcoming appointments, etc.  Non-urgent messages can be sent to your provider as well.   To learn more about what you can do with MyChart, go to [email or web address].    Your next appointment:   As scheduled with Dr. Cristal Deer

## 2023-07-30 LAB — BASIC METABOLIC PANEL
BUN/Creatinine Ratio: 14 (ref 12–28)
BUN: 12 mg/dL (ref 8–27)
CO2: 23 mmol/L (ref 20–29)
Calcium: 9.6 mg/dL (ref 8.7–10.3)
Chloride: 97 mmol/L (ref 96–106)
Creatinine, Ser: 0.88 mg/dL (ref 0.57–1.00)
Glucose: 100 mg/dL — ABNORMAL HIGH (ref 70–99)
Potassium: 4.9 mmol/L (ref 3.5–5.2)
Sodium: 135 mmol/L (ref 134–144)
eGFR: 68 mL/min/{1.73_m2} (ref >59)

## 2023-07-30 LAB — CBC
Hematocrit: 33.4 % — ABNORMAL LOW (ref 34.0–46.6)
Hemoglobin: 10.4 g/dL — ABNORMAL LOW (ref 11.1–15.9)
MCH: 27.4 pg (ref 26.6–33.0)
MCHC: 31.1 g/dL — ABNORMAL LOW (ref 31.5–35.7)
MCV: 88 fL (ref 79–97)
Platelets: 379 10*3/uL (ref 150–450)
RBC: 3.8 x10E6/uL (ref 3.77–5.28)
RDW: 14.1 % (ref 11.7–15.4)
WBC: 8 10*3/uL (ref 3.4–10.8)

## 2023-07-30 LAB — BRAIN NATRIURETIC PEPTIDE: BNP: 164.9 pg/mL — ABNORMAL HIGH (ref 0.0–100.0)

## 2023-07-31 ENCOUNTER — Encounter (HOSPITAL_BASED_OUTPATIENT_CLINIC_OR_DEPARTMENT_OTHER): Payer: Self-pay

## 2023-07-31 DIAGNOSIS — I10 Essential (primary) hypertension: Secondary | ICD-10-CM

## 2023-08-01 NOTE — Addendum Note (Signed)
 Addended by: Gabriel Rung on: 08/01/2023 09:11 AM   Modules accepted: Orders

## 2023-08-03 ENCOUNTER — Encounter: Payer: Self-pay | Admitting: Sports Medicine

## 2023-08-04 NOTE — Progress Notes (Addendum)
 Sarah Hendricks D.[facility] [address] [place] [address] Phone: [phone]   Assessment and Plan:     1. Primary osteoarthritis of right knee 2. Chronic pain of right knee  -Chronic with exacerbation, subsequent visit - Recurrence of right knee pain consistent with flare of osteoarthritis.  Likely exacerbated with patient having to recently wear right boot for ankle fracture. - Patient has significantly benefited from Zilretta in the past, so we will order prior authorization again for 2025 and Zilretta could be used for patient's next flare - Based on patient's level of pain at today's visit, she elected for intra-articular CSI.  Tolerated well per note below.  CSI may temporarily increase heart rate and blood pressure in patient with past medical history of hypertension and atrial fibrillation  Procedure: Knee Joint Injection Side: Right Indication: Flare of osteoarthritis  Risks explained and consent was given verbally. The site was cleaned with alcohol prep. A needle was introduced with an anterio-lateral approach. Injection given using 2mL of 1% lidocaine without epinephrine and 1mL of kenalog 40mg /ml. This was well tolerated and resulted in symptomatic relief.  Needle was removed, hemostasis achieved, and post injection instructions were explained.   Pt was advised to call or return to clinic if these symptoms worsen or fail to improve as anticipated.   Pertinent previous records reviewed include none  Follow Up: As needed if no improvement or worsening of symptoms.  Could consider Zilretta injection once approved   Subjective:   I, Sarah Hendricks am a scribe for Dr. Jean Rosenthal.     Chief Complaint: multiple body parts    HPI:    12/20/22 Patient is a 78 year old female complaining of left ankle pain. Patient states she was getting out of bed and she fell last Sunday night pain in her left leg . Has bruising. Does have some shoulder  pain but she states that is okay. Tylenol for the pain once a day. Pain does radiate up and down the leg but she is more afraid for a blood clot    01/11/2023 Patient states doing better, but right shoulder is still bothering her. Patient states the meloxicam not ure if it helped but it made her really dizzy.   02/24/2023 Patient states that her ankle is fine. Knee , shoulder and hip are flared    03/15/2023 Patient states right knee is doing terrible. Pain has been keeping her up at night . Meloxicam is no longer helping    03/24/2023 Patient states   08/05/2023 Patient states  R knee,    not good today. Nothing new to flare it up. Knee locked up the other day without cause.   Relevant Historical Information: Hypertension, osteoporosis, prediabete  Additional pertinent review of systems negative.   Current Outpatient Medications:    apixaban (ELIQUIS) 5 MG TABS tablet, Take 1 tablet (5 mg total) by mouth 2 (two) times daily., Disp: 180 tablet, Rfl: 1   Cholecalciferol (EQL VITAMIN D3) 1000 units tablet, Take 1 tablet (1,000 Units total) by mouth daily., Disp: , Rfl:    escitalopram (LEXAPRO) 10 MG tablet, TAKE 1 TABLET(10 MG) BY MOUTH DAILY, Disp: 90 tablet, Rfl: 1   furosemide (LASIX) 20 MG tablet, Take 1 tablet (20 mg total) by mouth daily., Disp: 30 tablet, Rfl: 11   losartan (COZAAR) 100 MG tablet, TAKE 1 TABLET(100 MG) BY MOUTH DAILY, Disp: 90 tablet, Rfl: 3   propranolol (INDERAL) 80 MG tablet, Take 1  tablet (80 mg total) by mouth 2 (two) times daily., Disp: 180 tablet, Rfl: 3   rosuvastatin (CRESTOR) 10 MG tablet, Take 1 tablet (10 mg total) by mouth daily., Disp: 90 tablet, Rfl: 3   traMADol (ULTRAM) 50 MG tablet, Take 1 tablet (50 mg total) by mouth every 6 (six) hours as needed., Disp: 30 tablet, Rfl: 0   Objective:     Vitals:   08/05/23 0844  BP: 102/60  Pulse: 70  SpO2: 98%  Height: 5\' 3"  (1.6 m)      Body mass index is 25.56 kg/m.    Physical Exam:     General:  awake, alert oriented, no acute distress nontoxic Skin: no suspicious lesions or rashes Neuro:sensation intact and strength 5/5 with no deficits, no atrophy, normal muscle tone Psych: No signs of anxiety, depression or other mood disorder   Left knee: No swelling No deformity Neg fluid wave, joint milking ROM Flex 110, Ext 0 TTP medial joint line NTTP over the quad tendon, medial fem condyle, lat fem condyle, patella, plica, patella tendon, tibial tuberostiy, fibular head, posterior fossa, pes anserine bursa, gerdy's tubercle,  , lateral jt line Neg anterior and posterior drawer  Gait normal   Electronically signed by:  Sarah Hendricks [facility] 9:04 AM 08/05/23

## 2023-08-05 ENCOUNTER — Ambulatory Visit: Admitting: Sports Medicine

## 2023-08-05 ENCOUNTER — Telehealth: Payer: Self-pay

## 2023-08-05 VITALS — BP 102/60 | HR 70 | Ht 63.0 in

## 2023-08-05 DIAGNOSIS — M25561 Pain in right knee: Secondary | ICD-10-CM | POA: Diagnosis not present

## 2023-08-05 DIAGNOSIS — G8929 Other chronic pain: Secondary | ICD-10-CM

## 2023-08-05 DIAGNOSIS — M1711 Unilateral primary osteoarthritis, right knee: Secondary | ICD-10-CM

## 2023-08-05 NOTE — Telephone Encounter (Signed)
 Zilretta request sent through [employer] today for right knee.   Case # [id number]

## 2023-08-05 NOTE — Telephone Encounter (Signed)
 Patient needs an appointment once supplies are available.   Zilretta approved for right knee. No prior authorization, medical notes or referrals needed. Patient has a [employer] HMO plan with an effective date of 05/18/2023. Plan follows Medicare guidelines.There are no out of network benefits under this plan. Patient responsibility for [phone] Arletta Bale) will be 20% with the remaining covered at 80% by the payer at the contracted rate. CPT code 19147 is covered at 100% by the payer at the contracted rate with no patient responsibility. Deductibles do not apply to these services. Specialist office visits if billed are covered after a $20 copay. Patient has an out of pocket maximum of $3500 and has accumulated $1818.33. If out of pocket is met, coverage goes to 100% and copay will no longer apply. Patient has a calendar year policy starting from May 18, 2023 to May 16, 2024.

## 2023-08-05 NOTE — Telephone Encounter (Signed)
 error

## 2023-08-09 ENCOUNTER — Ambulatory Visit: Admitting: Sports Medicine

## 2023-08-09 NOTE — Progress Notes (Deleted)
    Sarah Hendricks D.[facility] [address] [place] [address] Phone: [phone]   Assessment and Plan:     There are no diagnoses linked to this encounter.  ***   Pertinent previous records reviewed include ***    Follow Up: ***     Subjective:   I, Zaidin Blyden, am serving as a Neurosurgeon for Doctor Richardean Sale  Chief Complaint: multiple body parts    HPI:    12/20/22 Patient is a 78 year old female complaining of left ankle pain. Patient states she was getting out of bed and she fell last Sunday night pain in her left leg . Has bruising. Does have some shoulder pain but she states that is okay. Tylenol for the pain once a day. Pain does radiate up and down the leg but she is more afraid for a blood clot    01/11/2023 Patient states doing better, but right shoulder is still bothering her. Patient states the meloxicam not ure if it helped but it made her really dizzy.   02/24/2023 Patient states that her ankle is fine. Knee , shoulder and hip are flared    03/15/2023 Patient states right knee is doing terrible. Pain has been keeping her up at night . Meloxicam is no longer helping    03/24/2023 Patient states   08/05/2023 Patient states  R knee,  R shoulder and L hip not good today. Nothing new to flare it up. Knee locked up the other day without cause.   08/10/2023 Patient states  Relevant Historical Information: Hypertension, osteoporosis, prediabete  Additional pertinent review of systems negative.   Current Outpatient Medications:    apixaban (ELIQUIS) 5 MG TABS tablet, Take 1 tablet (5 mg total) by mouth 2 (two) times daily., Disp: 180 tablet, Rfl: 1   Cholecalciferol (EQL VITAMIN D3) 1000 units tablet, Take 1 tablet (1,000 Units total) by mouth daily., Disp: , Rfl:    escitalopram (LEXAPRO) 10 MG tablet, TAKE 1 TABLET(10 MG) BY MOUTH DAILY, Disp: 90 tablet, Rfl: 1   furosemide (LASIX) 20 MG tablet, Take 1 tablet (20  mg total) by mouth daily., Disp: 30 tablet, Rfl: 11   losartan (COZAAR) 100 MG tablet, TAKE 1 TABLET(100 MG) BY MOUTH DAILY, Disp: 90 tablet, Rfl: 3   propranolol (INDERAL) 80 MG tablet, Take 1 tablet (80 mg total) by mouth 2 (two) times daily., Disp: 180 tablet, Rfl: 3   rosuvastatin (CRESTOR) 10 MG tablet, Take 1 tablet (10 mg total) by mouth daily., Disp: 90 tablet, Rfl: 3   traMADol (ULTRAM) 50 MG tablet, Take 1 tablet (50 mg total) by mouth every 6 (six) hours as needed., Disp: 30 tablet, Rfl: 0   Objective:     There were no vitals filed for this visit.    There is no height or weight on file to calculate BMI.    Physical Exam:    ***   Electronically signed by:  Sarah Hendricks [facility] 10:43 AM 08/09/23

## 2023-08-09 NOTE — Telephone Encounter (Signed)
 Patient's knee is feeling okay right now.  She will call back to schedule.

## 2023-08-18 NOTE — Patient Instructions (Addendum)
        Medications changes include :   prolia for your bones-- we will look into the cost for you.       Return in about 6 months (around 02/18/2024) for Physical Exam, Schedule DEXA-[facility].

## 2023-08-18 NOTE — Progress Notes (Unsigned)
    [name] D.[facility] [address] [place] [address] Phone: [phone]   Assessment and Plan:     There are no diagnoses linked to this encounter.  ***   Pertinent previous records reviewed include ***    Follow Up: ***     Subjective:      Chief Complaint: multiple body parts    HPI:    12/20/22 Patient is a 78 year old female complaining of left ankle pain. Patient states she was getting out of bed and she fell last Sunday night pain in her left leg . Has bruising. Does have some shoulder pain but she states that is okay. Tylenol for the pain once a day. Pain does radiate up and down the leg but she is more afraid for a blood clot    01/11/2023 Patient states doing better, but right shoulder is still bothering her. Patient states the meloxicam not ure if it helped but it made her really dizzy.   02/24/2023 Patient states that her ankle is fine. Knee , shoulder and hip are flared    03/15/2023 Patient states right knee is doing terrible. Pain has been keeping her up at night . Meloxicam is no longer helping    03/24/2023 Patient states   08/05/2023 Patient states  R knee,  R shoulder and L hip not good today. Nothing new to flare it up. Knee locked up the other day without cause.   08/19/2023 Patient states    Relevant Historical Information: Hypertension, osteoporosis, prediabete  Additional pertinent review of systems negative.   Current Outpatient Medications:    apixaban (ELIQUIS) 5 MG TABS tablet, Take 1 tablet (5 mg total) by mouth 2 (two) times daily., Disp: 180 tablet, Rfl: 1   Cholecalciferol (EQL VITAMIN D3) 1000 units tablet, Take 1 tablet (1,000 Units total) by mouth daily., Disp: , Rfl:    escitalopram (LEXAPRO) 10 MG tablet, TAKE 1 TABLET(10 MG) BY MOUTH DAILY, Disp: 90 tablet, Rfl: 1   furosemide (LASIX) 20 MG tablet, Take 1 tablet (20 mg total) by mouth daily., Disp: 30 tablet, Rfl: 11   losartan  (COZAAR) 100 MG tablet, TAKE 1 TABLET(100 MG) BY MOUTH DAILY, Disp: 90 tablet, Rfl: 3   propranolol (INDERAL) 80 MG tablet, Take 1 tablet (80 mg total) by mouth 2 (two) times daily., Disp: 180 tablet, Rfl: 3   rosuvastatin (CRESTOR) 10 MG tablet, Take 1 tablet (10 mg total) by mouth daily., Disp: 90 tablet, Rfl: 3   traMADol (ULTRAM) 50 MG tablet, Take 1 tablet (50 mg total) by mouth every 6 (six) hours as needed., Disp: 30 tablet, Rfl: 0   Objective:     There were no vitals filed for this visit.    There is no height or weight on file to calculate BMI.    Physical Exam:    ***   Electronically signed by:  [name] [facility] 7:35 AM 08/18/23

## 2023-08-18 NOTE — Progress Notes (Unsigned)
      Subjective:    Patient ID: Sarah Hendricks, female    DOB: 03-12-1946, 78 y.o.   MRN: [id number]     HPI Sarah Hendricks is here for follow up of her chronic medical problems.  discuss prolia.  Ok to hold labs  Medications and allergies reviewed with patient and updated if appropriate.  Current Outpatient Medications on File Prior to Visit  Medication Sig Dispense Refill   apixaban (ELIQUIS) 5 MG TABS tablet Take 1 tablet (5 mg total) by mouth 2 (two) times daily. 180 tablet 1   Cholecalciferol (EQL VITAMIN D3) 1000 units tablet Take 1 tablet (1,000 Units total) by mouth daily.     escitalopram (LEXAPRO) 10 MG tablet TAKE 1 TABLET(10 MG) BY MOUTH DAILY 90 tablet 1   furosemide (LASIX) 20 MG tablet Take 1 tablet (20 mg total) by mouth daily. 30 tablet 11   losartan (COZAAR) 100 MG tablet TAKE 1 TABLET(100 MG) BY MOUTH DAILY 90 tablet 3   propranolol (INDERAL) 80 MG tablet Take 1 tablet (80 mg total) by mouth 2 (two) times daily. 180 tablet 3   rosuvastatin (CRESTOR) 10 MG tablet Take 1 tablet (10 mg total) by mouth daily. 90 tablet 3   traMADol (ULTRAM) 50 MG tablet Take 1 tablet (50 mg total) by mouth every 6 (six) hours as needed. 30 tablet 0   No current facility-administered medications on file prior to visit.     Review of Systems     Objective:  There were no vitals filed for this visit. BP Readings from Last 3 Encounters:  08/05/23 102/60  07/29/23 122/64  07/15/23 (!) 152/94   Wt Readings from Last 3 Encounters:  07/29/23 144 lb 4.8 oz (65.5 kg)  07/15/23 145 lb (65.8 kg)  07/04/23 152 lb (68.9 kg)   There is no height or weight on file to calculate BMI.    Physical Exam     Lab Results  Component Value Date   WBC 8.0 07/29/2023   HGB 10.4 (L) 07/29/2023   HCT 33.4 (L) 07/29/2023   PLT 379 07/29/2023   GLUCOSE 100 (H) 07/29/2023   CHOL 137 01/28/2023   TRIG 162.0 (H) 01/28/2023   HDL 50.60 01/28/2023   LDLDIRECT 87.0 06/12/2018   LDLCALC 54  01/28/2023   ALT 16 07/03/2023   AST 22 07/03/2023   NA 135 07/29/2023   K 4.9 07/29/2023   CL 97 07/29/2023   CREATININE 0.88 07/29/2023   BUN 12 07/29/2023   CO2 23 07/29/2023   TSH 1.262 05/30/2023   INR 1.3 (H) 05/30/2023   HGBA1C 5.8 01/28/2023     Assessment & Plan:    See Problem List for Assessment and Plan of chronic medical problems.

## 2023-08-19 ENCOUNTER — Ambulatory Visit (INDEPENDENT_AMBULATORY_CARE_PROVIDER_SITE_OTHER): Admitting: Internal Medicine

## 2023-08-19 ENCOUNTER — Encounter: Payer: Self-pay | Admitting: Internal Medicine

## 2023-08-19 ENCOUNTER — Ambulatory Visit: Admitting: Sports Medicine

## 2023-08-19 VITALS — HR 61 | Ht 63.0 in | Wt 144.0 lb

## 2023-08-19 VITALS — BP 114/70 | HR 61 | Temp 98.0°F | Ht 63.0 in | Wt 144.0 lb

## 2023-08-19 DIAGNOSIS — I10 Essential (primary) hypertension: Secondary | ICD-10-CM | POA: Diagnosis not present

## 2023-08-19 DIAGNOSIS — E559 Vitamin D deficiency, unspecified: Secondary | ICD-10-CM

## 2023-08-19 DIAGNOSIS — I4891 Unspecified atrial fibrillation: Secondary | ICD-10-CM | POA: Diagnosis not present

## 2023-08-19 DIAGNOSIS — R7303 Prediabetes: Secondary | ICD-10-CM | POA: Diagnosis not present

## 2023-08-19 DIAGNOSIS — G8929 Other chronic pain: Secondary | ICD-10-CM | POA: Diagnosis not present

## 2023-08-19 DIAGNOSIS — M810 Age-related osteoporosis without current pathological fracture: Secondary | ICD-10-CM

## 2023-08-19 DIAGNOSIS — M25561 Pain in right knee: Secondary | ICD-10-CM | POA: Diagnosis not present

## 2023-08-19 DIAGNOSIS — F4329 Adjustment disorder with other symptoms: Secondary | ICD-10-CM

## 2023-08-19 DIAGNOSIS — I2510 Atherosclerotic heart disease of native coronary artery without angina pectoris: Secondary | ICD-10-CM

## 2023-08-19 DIAGNOSIS — M1711 Unilateral primary osteoarthritis, right knee: Secondary | ICD-10-CM

## 2023-08-19 MED ORDER — TRIAMCINOLONE ACETONIDE 32 MG IX SRER
32.0000 mg | Freq: Once | INTRA_ARTICULAR | Status: AC
  Administered 2023-08-19: 32 mg via INTRA_ARTICULAR

## 2023-08-19 MED ORDER — DENOSUMAB 60 MG/ML ~~LOC~~ SOSY: 60.0000 mg | PREFILLED_SYRINGE | Freq: Once | SUBCUTANEOUS | Status: AC

## 2023-08-19 NOTE — Assessment & Plan Note (Signed)
 Chronic Blood pressure well-controlled She is monitoring at home and it has been controlled Continue losartan 100 mg daily, propranolol 80 mg twice daily

## 2023-08-19 NOTE — Assessment & Plan Note (Addendum)
 Chronic Diagnosed earlier this year Following with cardiology S/p cardioversion On Eliquis 5 mg twice daily, propranolol 80 mg twice daily Rate controlled Asymptomatic

## 2023-08-19 NOTE — Assessment & Plan Note (Addendum)
 Chronic DEXA due this month-ordered Intolerant to alendronate and Evista in the past Discussed high risk of fracture-she did have a recent fracture Stressed that medication is recommended Recommended Prolia-reviewed side effects-she is willing to try it Prolia ordered every 6 months Stressed regular exercise, balance exercises Fall prevention Continue calcium and vitamin D supplementation Check vitamin D level

## 2023-08-19 NOTE — Assessment & Plan Note (Signed)
 Chronic With osteoporosis Continue vitamin D daily

## 2023-08-19 NOTE — Assessment & Plan Note (Signed)
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

## 2023-08-19 NOTE — Assessment & Plan Note (Signed)
Chronic History of stent following MI in 2008 while living in [place] Continue aspirin 81 mg daily, Crestor 10 mg daily Encouraged regular exercise, healthy diet

## 2023-08-19 NOTE — Assessment & Plan Note (Signed)
 Chronic Lab Results  Component Value Date   HGBA1C 5.8 01/28/2023   We will hold off on checking A1c and check in 6 months Low sugar / carb diet Stressed regular exercise

## 2023-08-19 NOTE — Patient Instructions (Signed)
 If pain returns before 3 months call our clinic and ask for gel shot approval  If it returns after 3 months call and ask for zilretta

## 2023-08-24 ENCOUNTER — Inpatient Hospital Stay: Admission: RE | Admit: 2023-08-24 | Source: Ambulatory Visit

## 2023-08-29 ENCOUNTER — Encounter: Payer: Self-pay | Admitting: Sports Medicine

## 2023-08-30 ENCOUNTER — Ambulatory Visit (INDEPENDENT_AMBULATORY_CARE_PROVIDER_SITE_OTHER)
Admission: RE | Admit: 2023-08-30 | Discharge: 2023-08-30 | Disposition: A | Discharge status: Home / Self Care | Source: Ambulatory Visit | Attending: Internal Medicine | Admitting: Internal Medicine

## 2023-08-30 DIAGNOSIS — M810 Age-related osteoporosis without current pathological fracture: Secondary | ICD-10-CM | POA: Diagnosis not present

## 2023-08-31 ENCOUNTER — Encounter: Payer: Self-pay | Admitting: Internal Medicine

## 2023-09-02 ENCOUNTER — Telehealth: Payer: Self-pay

## 2023-09-02 ENCOUNTER — Other Ambulatory Visit (HOSPITAL_COMMUNITY): Payer: Self-pay

## 2023-09-02 NOTE — Telephone Encounter (Signed)
 Pt ready for scheduling for PROLIA  on or after : 09/02/23  Option# 1: Buy/Bill (Office supplied medication)  Out-of-pocket cost due at time of clinic visit: $332  Number of injection/visits approved: ---  Primary: [employer] MED[place]ARE Prolia  co-insurance: 20% Admin fee co-insurance: 0%  Secondary: --- Prolia  co-insurance:  Admin fee co-insurance:   Medical Benefit Details: Date Benefits were checked: 09/02/23 Deductible: NO/ Coinsurance: 20%/ Admin Fee: 0%  Prior Auth: N/A PA# Expiration Date:   # of doses approved: ----------------------------------------------------------------------- Option# 2- Med Obtained from pharmacy:  Pharmacy benefit: Copay $1061.70 (Paid to pharmacy) Admin Fee: 0% (Pay at clinic)  Prior Auth: N/A PA# Expiration Date:   # of doses approved:   If patient wants fill through the pharmacy benefit please send prescription to:  [facility] , and include estimated need by date in rx notes. Pharmacy will ship medication directly to the office.  Patient NOT eligible for Prolia  Copay Card. Copay Card can make patient's cost as little as $25. Link to apply: [email or web address]://[email or web address]  ** This summary of benefits is an estimation of the patient's out-of-pocket cost. Exact cost may very based on individual plan coverage.

## 2023-09-02 NOTE — Telephone Encounter (Signed)
 Prolia VOB initiated via [email or web address]  Next Prolia inj DUE: NEW START

## 2023-09-02 NOTE — Telephone Encounter (Signed)
 Sarah Hendricks

## 2023-09-02 NOTE — Telephone Encounter (Addendum)
 Pharmacy Patient Advocate Encounter   Insurance verification completed.   The patient is insured through [facility] .   Per test claim: PA required; PA submitted to above mentioned insurance via CoverMyMeds Key/confirmation #/EOC [id number] Status is pending    *PHARMACY BENEFIT*

## 2023-09-02 NOTE — Telephone Encounter (Signed)
 Pharmacy Patient Advocate Encounter  Received notification from [facility] that Prior Authorization for PROLIA  has been APPROVED from 09/02/23 to 09/01/24. Ran test claim, Copay is $882.21. This test claim was processed through [facility]- copay amounts may vary at other pharmacies due to pharmacy/plan contracts, or as the patient moves through the different stages of their insurance plan.   PA #/Case ID/Reference #: [id number]

## 2023-09-03 ENCOUNTER — Other Ambulatory Visit (HOSPITAL_BASED_OUTPATIENT_CLINIC_OR_DEPARTMENT_OTHER): Payer: Self-pay

## 2023-09-07 DIAGNOSIS — I10 Essential (primary) hypertension: Secondary | ICD-10-CM | POA: Diagnosis not present

## 2023-09-08 ENCOUNTER — Encounter (HOSPITAL_BASED_OUTPATIENT_CLINIC_OR_DEPARTMENT_OTHER): Admitting: Orthopaedic Surgery

## 2023-09-08 ENCOUNTER — Encounter (HOSPITAL_BASED_OUTPATIENT_CLINIC_OR_DEPARTMENT_OTHER): Payer: Self-pay

## 2023-09-08 DIAGNOSIS — I10 Essential (primary) hypertension: Secondary | ICD-10-CM

## 2023-09-08 LAB — BASIC METABOLIC PANEL WITH GFR
BUN/Creatinine Ratio: 17 (ref 12–28)
BUN: 16 mg/dL (ref 8–27)
CO2: 22 mmol/L (ref 20–29)
Calcium: 9.2 mg/dL (ref 8.7–10.3)
Chloride: 96 mmol/L (ref 96–106)
Creatinine, Ser: 0.92 mg/dL (ref 0.57–1.00)
Glucose: 112 mg/dL — ABNORMAL HIGH (ref 70–99)
Potassium: 4.8 mmol/L (ref 3.5–5.2)
Sodium: 133 mmol/L — ABNORMAL LOW (ref 134–144)
eGFR: 65 mL/min/{1.73_m2} (ref >59)

## 2023-09-08 MED ORDER — FUROSEMIDE 20 MG PO TABS: ORAL_TABLET | ORAL | 11 refills | Status: DC

## 2023-09-08 NOTE — Telephone Encounter (Signed)
 Seen by patient Sarah Hendricks on 09/08/2023  8:25 AM   Rx updated & labs ordered

## 2023-09-09 ENCOUNTER — Encounter (HOSPITAL_BASED_OUTPATIENT_CLINIC_OR_DEPARTMENT_OTHER): Admitting: Orthopaedic Surgery

## 2023-09-14 ENCOUNTER — Encounter (HOSPITAL_BASED_OUTPATIENT_CLINIC_OR_DEPARTMENT_OTHER): Payer: Self-pay

## 2023-09-15 NOTE — Telephone Encounter (Signed)
 Recommend labs today or tomorrow as ordered to reassess kidney function and sodium prior to medication changes. Recommend low sodium diet, restricting to <2L (64 oz) fluid intake per day, elevating legs when sitting to help with fluid volume status.    Joniel Graumann S Deana Krock, NP

## 2023-09-16 ENCOUNTER — Ambulatory Visit (HOSPITAL_BASED_OUTPATIENT_CLINIC_OR_DEPARTMENT_OTHER)

## 2023-09-16 ENCOUNTER — Other Ambulatory Visit (HOSPITAL_COMMUNITY): Payer: Self-pay | Admitting: Internal Medicine

## 2023-09-16 ENCOUNTER — Ambulatory Visit (HOSPITAL_BASED_OUTPATIENT_CLINIC_OR_DEPARTMENT_OTHER): Admitting: Orthopaedic Surgery

## 2023-09-16 ENCOUNTER — Ambulatory Visit (HOSPITAL_BASED_OUTPATIENT_CLINIC_OR_DEPARTMENT_OTHER): Payer: Self-pay | Admitting: Orthopaedic Surgery

## 2023-09-16 DIAGNOSIS — M7989 Other specified soft tissue disorders: Secondary | ICD-10-CM | POA: Diagnosis not present

## 2023-09-16 DIAGNOSIS — S82841A Displaced bimalleolar fracture of right lower leg, initial encounter for closed fracture: Secondary | ICD-10-CM

## 2023-09-16 DIAGNOSIS — Z4789 Encounter for other orthopedic aftercare: Secondary | ICD-10-CM | POA: Diagnosis not present

## 2023-09-16 DIAGNOSIS — I10 Essential (primary) hypertension: Secondary | ICD-10-CM | POA: Diagnosis not present

## 2023-09-16 DIAGNOSIS — M85871 Other specified disorders of bone density and structure, right ankle and foot: Secondary | ICD-10-CM | POA: Diagnosis not present

## 2023-09-16 NOTE — Progress Notes (Signed)
 Post Operative Evaluation    Procedure/Date of Surgery: Right ankle open reduction internal fixation 2/3  Interval History:   Presents today 13 overall doing extremely well.  At this time she is walking without any limp or pain.  She is occasionally still experiencing swelling which she is somewhat concerned about  PMH/PSH/Family History/Social History/Meds/Allergies:    Past Medical History:  Diagnosis Date   Allergy    Anxiety    Arthritis    Bimalleolar ankle fracture, right, closed, initial encounter    Breast cancer (HCC) 1994   lumpectomy and XRT, no chemo   CAD (coronary artery disease) 2008   PTCA, in [place]   GERD (gastroesophageal reflux disease)    History of chicken pox    Hyperlipidemia    Hypertension    Myocardial infarction (HCC) 03/15/2007   stent placed   Vitamin D  deficiency    Past Surgical History:  Procedure Laterality Date   BREAST BIOPSY Left 1994   BREAST LUMPECTOMY Left 1994   CARDIOVERSION N/A 07/15/2023   Procedure: CARDIOVERSION;  Surgeon: Euell Herrlich, MD;  Location: [facility];  Service: Cardiovascular;  Laterality: N/A;   CATARACT EXTRACTION, BILATERAL     CORONARY ANGIOPLASTY WITH STENT PLACEMENT  2008   CRANIECTOMY FOR DEPRESSED SKULL FRACTURE  1986   ORIF ANKLE FRACTURE Right 06/20/2023   Procedure: RIGHT OPEN REDUCTION INTERNAL FIXATION (ORIF) ANKLE FRACTURE;  Surgeon: Wilhelmenia Harada, MD;  Location: [facility];  Service: Orthopedics;  Laterality: Right;   TONSILLECTOMY  1966   Social History   Socioeconomic History   Marital status: Married    Spouse name: Not on file   Number of children: Not on file   Years of education: Not on file   Highest education level: Not on file  Occupational History   Not on file  Tobacco Use   Smoking status: Never   Smokeless tobacco: Never  Vaping Use   Vaping status: Never Used  Substance and Sexual Activity   Alcohol use: No    Drug use: No   Sexual activity: Not Currently    Birth control/protection: Post-menopausal  Other Topics Concern   Not on file  Social History Narrative   Married, lives with spouse   Retired from [employer] irregularly, rides an exercise bike   Social Drivers of Health   Financial Resource Strain: Low Risk  (09/23/2022)   Overall Financial Resource Strain (CARDIA)    Difficulty of Paying Living Expenses: Not hard at all  Food Insecurity: No Food Insecurity (05/30/2023)   Hunger Vital Sign    Worried About Running Out of Food in the Last Year: Never true    Ran Out of Food in the Last Year: Never true  Transportation Needs: No Transportation Needs (05/30/2023)   PRAPARE - Administrator, Civil Service (Medical): No    Lack of Transportation (Non-Medical): No  Physical Activity: Sufficiently Active (09/23/2022)   Exercise Vital Sign    Days of Exercise per Week: 5 days    Minutes of Exercise per Session: 30 min  Stress: No Stress Concern Present (09/23/2022)   [employer] of Occupational Health - Occupational Stress Questionnaire    Feeling of Stress : Not at all  Social Connections: Socially Integrated (05/30/2023)  Social Advertising account executive [NHANES]    Frequency of Communication with Friends and Family: More than three times a week    Frequency of Social Gatherings with Friends and Family: More than three times a week    Attends Religious Services: More than 4 times per year    Active Member of [employer] or Organizations: No    Attends Engineer, structural: More than 4 times per year    Marital Status: Married   Family History  Problem Relation Age of Onset   Breast cancer Mother    Hypertension Mother    Heart disease Mother    Stroke Mother    Arthritis Father    Heart disease Father        died at 54 of heart attack   Heart disease Brother    Stroke Maternal Grandmother    Heart disease Maternal Grandfather    Colon cancer  Neg Hx    Esophageal cancer Neg Hx    Rectal cancer Neg Hx    Stomach cancer Neg Hx    Allergies  Allergen Reactions   Acetaminophen -Codeine Other (See Comments)    Bad dreams   Tigan [Trimethobenzamide] Other (See Comments)    Bad dreams   Alendronate     Caused leg pains    Atorvastatin  Other (See Comments)    Pt reports "causes bilateral leg pains."   Codeine     Bad dreams   Simvastatin Other (See Comments)    Leg cramps   Current Outpatient Medications  Medication Sig Dispense Refill   apixaban  (ELIQUIS ) 5 MG TABS tablet Take 1 tablet (5 mg total) by mouth 2 (two) times daily. 180 tablet 1   Cholecalciferol  (EQL VITAMIN D3) 1000 units tablet Take 1 tablet (1,000 Units total) by mouth daily.     escitalopram  (LEXAPRO ) 10 MG tablet TAKE 1 TABLET(10 MG) BY MOUTH DAILY 90 tablet 1   furosemide  (LASIX ) 20 MG tablet Take one tablet 3 times per week 30 tablet 11   losartan  (COZAAR ) 100 MG tablet TAKE 1 TABLET(100 MG) BY MOUTH DAILY 90 tablet 3   propranolol  (INDERAL ) 80 MG tablet Take 1 tablet (80 mg total) by mouth 2 (two) times daily. 180 tablet 3   rosuvastatin  (CRESTOR ) 10 MG tablet Take 1 tablet (10 mg total) by mouth daily. 90 tablet 3   traMADol  (ULTRAM ) 50 MG tablet Take 1 tablet (50 mg total) by mouth every 6 (six) hours as needed. 30 tablet 0   Current Facility-Administered Medications  Medication Dose Route Frequency Provider Last Rate Last Admin   denosumab  (PROLIA ) injection 60 mg  60 mg Subcutaneous Once        No results found.  Review of Systems:   A ROS was performed including pertinent positives and negatives as documented in the HPI.   Musculoskeletal Exam:    There were no vitals taken for this visit.  Right ankle incision is well-appearing without erythema or drainage.  She has 10 degrees dorsi and plantarflexion of the right foot.  Sensation is intact all distributions of the right foot with 2+ dorsalis pedis pulse  Imaging:    3 views right  ankle: Status post fibular fixation without evidence complication  I personally reviewed and interpreted the radiographs.   Assessment:   12 weeks status post right ankle fixation overall doing very well.  Overall she is continuing to improve nicely.  At this time I did recommend a compression sleeve for improvement in her  swelling.  I will plan to see her back in 12 weeks for final check  Plan :    -Return to clinic 12 weeks for reassessment      I personally saw and evaluated the patient, and participated in the management and treatment plan.  Wilhelmenia Harada, MD Attending Physician, Orthopedic Surgery  This document was dictated using Dragon voice recognition software. A reasonable attempt at proof reading has been made to minimize errors.

## 2023-09-17 LAB — BASIC METABOLIC PANEL WITH GFR
BUN/Creatinine Ratio: 13 (ref 12–28)
BUN: 13 mg/dL (ref 8–27)
CO2: 22 mmol/L (ref 20–29)
Calcium: 9.3 mg/dL (ref 8.7–10.3)
Chloride: 96 mmol/L (ref 96–106)
Creatinine, Ser: 0.97 mg/dL (ref 0.57–1.00)
Glucose: 91 mg/dL (ref 70–99)
Potassium: 4.5 mmol/L (ref 3.5–5.2)
Sodium: 134 mmol/L (ref 134–144)
eGFR: 61 mL/min/{1.73_m2} (ref >59)

## 2023-09-19 ENCOUNTER — Encounter (HOSPITAL_BASED_OUTPATIENT_CLINIC_OR_DEPARTMENT_OTHER): Payer: Self-pay

## 2023-09-19 DIAGNOSIS — I10 Essential (primary) hypertension: Secondary | ICD-10-CM

## 2023-09-27 ENCOUNTER — Ambulatory Visit (INDEPENDENT_AMBULATORY_CARE_PROVIDER_SITE_OTHER): Payer: Medicare Other

## 2023-09-27 VITALS — Ht 63.0 in | Wt 144.0 lb

## 2023-09-27 DIAGNOSIS — Z0000 Encounter for general adult medical examination without abnormal findings: Secondary | ICD-10-CM | POA: Diagnosis not present

## 2023-09-27 NOTE — Patient Instructions (Signed)
 Sarah Hendricks , Thank you for taking time out of your busy schedule to complete your Annual Wellness Visit with me. I enjoyed our conversation and look forward to speaking with you again next year. I, as well as your care team,  appreciate your ongoing commitment to your health goals. Please review the following plan we discussed and let me know if I can assist you in the future. Your Game plan/ To Do List   Follow up Visits: Next Medicare AWV with our clinical staff: 09/27/2024   Have you seen your provider in the last 6 months (3 months if uncontrolled diabetes)? Yes Next Office Visit with your provider: 02/20/2024  Clinician Recommendations:  Aim for 30 minutes of exercise or brisk walking, 6-8 glasses of water, and 5 servings of fruits and vegetables each day.       This is a list of the screening recommended for you and due dates:  Health Maintenance  Topic Date Due   COVID-19 Vaccine (4 - 2024-25 season) 10/13/2023*   Zoster (Shingles) Vaccine (1 of 2) 12/28/2023*   Flu Shot  12/16/2023   Medicare Annual Wellness Visit  09/26/2024   DEXA scan (bone density measurement)  08/29/2025   DTaP/Tdap/Td vaccine (2 - Td or Tdap) 02/14/2028   Pneumonia Vaccine  Completed   Hepatitis C Screening  Completed   HPV Vaccine  Aged Out   Meningitis B Vaccine  Aged Out   Colon Cancer Screening  Discontinued  *Topic was postponed. The date shown is not the original due date.    Advanced directives: (Copy Requested) Please bring a copy of your health care power of attorney and living will to the office to be added to your chart at your convenience. You can mail to [facility] [address] [place], [place] [address] or email to [email or web address] Advance Care Planning is important because it:  [x]  Makes sure you receive the medical care that is consistent with your values, goals, and preferences  [x]  It provides guidance to your family and loved ones and reduces their  decisional burden about whether or not they are making the right decisions based on your wishes.  Follow the link provided in your after visit summary or read over the paperwork we have mailed to you to help you started getting your Advance Directives in place. If you need assistance in completing these, please reach out to us  so that we can help you!   Managing Pain Without Opioids Opioids are strong medicines used to treat moderate to severe pain. For some people, especially those who have long-term (chronic) pain, opioids may not be the best choice for pain management due to: Side effects like nausea, constipation, and sleepiness. The risk of addiction (opioid use disorder). The longer you take opioids, the greater your risk of addiction. Pain that lasts for more than 3 months is called chronic pain. Managing chronic pain usually requires more than one approach and is often provided by a team of health care providers working together (multidisciplinary approach). Pain management may be done at a pain management center or pain clinic. How to manage pain without the use of opioids Use non-opioid medicines Non-opioid medicines for pain may include: Over-the-counter or prescription non-steroidal anti-inflammatory drugs (NSAIDs). These may be the first medicines used for pain. They work well for muscle and bone pain, and they reduce swelling. Acetaminophen . This over-the-counter medicine may work well for milder pain but not swelling. Antidepressants. These may be used to  treat chronic pain. A certain type of antidepressant (tricyclics) is often used. These medicines are given in lower doses for pain than when used for depression. Anticonvulsants. These are usually used to treat seizures but may also reduce nerve (neuropathic) pain. Muscle relaxants. These relieve pain caused by sudden muscle tightening (spasms). You may also use a pain medicine that is applied to the skin as a patch, cream, or gel  (topical analgesic), such as a numbing medicine. These may cause fewer side effects than medicines taken by mouth. Do certain therapies as directed Some therapies can help with pain management. They include: Physical therapy. You will do exercises to gain strength and flexibility. A physical therapist may teach you exercises to move and stretch parts of your body that are weak, stiff, or painful. You can learn these exercises at physical therapy visits and practice them at home. Physical therapy may also involve: Massage. Heat wraps or applying heat or cold to affected areas. Electrical signals that interrupt pain signals (transcutaneous electrical nerve stimulation, TENS). Weak lasers that reduce pain and swelling (low-level laser therapy). Signals from your body that help you learn to regulate pain (biofeedback). Occupational therapy. This helps you to learn ways to function at home and work with less pain. Recreational therapy. This involves trying new activities or hobbies, such as a physical activity or drawing. Mental health therapy, including: Cognitive behavioral therapy (CBT). This helps you learn coping skills for dealing with pain. Acceptance and commitment therapy (ACT) to change the way you think and react to pain. Relaxation therapies, including muscle relaxation exercises and mindfulness-based stress reduction. Pain management counseling. This may be individual, family, or group counseling.  Receive medical treatments Medical treatments for pain management include: Nerve block injections. These may include a pain blocker and anti-inflammatory medicines. You may have injections: Near the spine to relieve chronic back or neck pain. Into joints to relieve back or joint pain. Into nerve areas that supply a painful area to relieve body pain. Into muscles (trigger point injections) to relieve some painful muscle conditions. A medical device placed near your spine to help block pain  signals and relieve nerve pain or chronic back pain (spinal cord stimulation device). Acupuncture. Follow these instructions at home Medicines Take over-the-counter and prescription medicines only as told by your health care provider. If you are taking pain medicine, ask your health care providers about possible side effects to watch out for. Do not drive or use heavy machinery while taking prescription opioid pain medicine. Lifestyle  Do not use drugs or alcohol to reduce pain. If you drink alcohol, limit how much you have to: 0-1 drink a day for women who are not pregnant. 0-2 drinks a day for men. Know how much alcohol is in a drink. In the [place] one drink equals one 12 oz bottle of beer (355 mL), one 5 oz glass of wine (148 mL), or one 1 oz glass of hard liquor (44 mL). Do not use any products that contain nicotine or tobacco. These products include cigarettes, chewing tobacco, and vaping devices, such as e-cigarettes. If you need help quitting, ask your health care provider. Eat a healthy diet and maintain a healthy weight. Poor diet and excess weight may make pain worse. Eat foods that are high in fiber. These include fresh fruits and vegetables, whole grains, and beans. Limit foods that are high in fat and processed sugars, such as fried and sweet foods. Exercise regularly. Exercise lowers stress and may help relieve pain.  Ask your health care provider what activities and exercises are safe for you. If your health care provider approves, join an exercise class that combines movement and stress reduction. Examples include yoga and tai chi. Get enough sleep. Lack of sleep may make pain worse. Lower stress as much as possible. Practice stress reduction techniques as told by your therapist. General instructions Work with all your pain management providers to find the treatments that work best for you. You are an important member of your pain management team. There are many things you can  do to reduce pain on your own. Consider joining an online or in-person support group for people who have chronic pain. Keep all follow-up visits. This is important. Where to find more information You can find more information about managing pain without opioids from: American Academy of Pain Medicine: painmed.org Institute for Chronic Pain: instituteforchronicpain.org American Chronic Pain Association: theacpa.org Contact a health care provider if: You have side effects from pain medicine. Your pain gets worse or does not get better with treatments or home therapy. You are struggling with anxiety or depression. Summary Many types of pain can be managed without opioids. Chronic pain may respond better to pain management without opioids. Pain is best managed when you and a team of health care providers work together. Pain management without opioids may include non-opioid medicines, medical treatments, physical therapy, mental health therapy, and lifestyle changes. Tell your health care providers if your pain gets worse or is not being managed well enough. This information is not intended to replace advice given to you by your health care provider. Make sure you discuss any questions you have with your health care provider. Document Revised: 08/13/2020 Document Reviewed: 08/13/2020 Elsevier Patient Education  2024 [employer].

## 2023-09-27 NOTE — Progress Notes (Signed)
 Subjective:   Sarah Hendricks is a 78 y.o. who presents for a Medicare Wellness preventive visit.  As a reminder, Annual Wellness Visits don't include a physical exam, and some assessments may be limited, especially if this visit is performed virtually. We may recommend an in-person visit if needed.  Visit Complete: Virtual I connected with  [name] on 09/27/23 by a audio enabled telemedicine application and verified that I am speaking with the correct person using two identifiers.  Patient Location: Home  Provider Location: Office/Clinic  I discussed the limitations of evaluation and management by telemedicine. The patient expressed understanding and agreed to proceed.  Vital Signs: Because this visit was a virtual/telehealth visit, some criteria may be missing or patient reported. Any vitals not documented were not able to be obtained and vitals that have been documented are patient reported.  VideoDeclined- This patient declined Librarian, academic. Therefore the visit was completed with audio only.  Persons Participating in Visit: Patient.  AWV Questionnaire: Yes: Patient Medicare AWV questionnaire was completed by the patient on 09/22/2023; I have confirmed that all information answered by patient is correct and no changes since this date.  Cardiac Risk Factors include: advanced age (>12men, >31 women);hypertension;dyslipidemia     Objective:     Today's Vitals   09/27/23 1129  Weight: 144 lb (65.3 kg)  Height: 5\' 3"  (1.6 m)   Body mass index is 25.51 kg/m.     09/27/2023   11:29 AM 07/15/2023   11:44 AM 06/20/2023   11:16 AM 06/15/2023    4:36 PM 05/30/2023   11:29 AM 05/29/2023    4:29 PM 09/23/2022    1:05 PM  Advanced Directives  Does Patient Have a Medical Advance Directive? Yes Yes No No Yes No Yes  Type of Estate agent of [place];Living will Healthcare Power of [place]   Living will  Healthcare Power of  [place];Living will  Does patient want to make changes to medical advance directive?   No - Patient declined  Yes (Inpatient - patient defers changing a medical advance directive and declines information at this time)    Copy of Healthcare Power of Attorney in Chart? No - copy requested      No - copy requested  Would patient like information on creating a medical advance directive?   No - Patient declined        Current Medications (verified) Outpatient Encounter Medications as of 09/27/2023  Medication Sig   apixaban  (ELIQUIS ) 5 MG TABS tablet Take 1 tablet (5 mg total) by mouth 2 (two) times daily.   Cholecalciferol  (EQL VITAMIN D3) 1000 units tablet Take 1 tablet (1,000 Units total) by mouth daily.   escitalopram  (LEXAPRO ) 10 MG tablet TAKE 1 TABLET(10 MG) BY MOUTH DAILY   furosemide  (LASIX ) 20 MG tablet Take one tablet 3 times per week   losartan  (COZAAR ) 100 MG tablet TAKE 1 TABLET(100 MG) BY MOUTH DAILY   propranolol  (INDERAL ) 80 MG tablet Take 1 tablet (80 mg total) by mouth 2 (two) times daily.   rosuvastatin  (CRESTOR ) 10 MG tablet Take 1 tablet (10 mg total) by mouth daily.   traMADol  (ULTRAM ) 50 MG tablet Take 1 tablet (50 mg total) by mouth every 6 (six) hours as needed.   Facility-Administered Encounter Medications as of 09/27/2023  Medication   denosumab  (PROLIA ) injection 60 mg    Allergies (verified) Acetaminophen -codeine, Tigan [trimethobenzamide], Alendronate, Atorvastatin , Codeine, and Simvastatin   History: Past Medical History:  Diagnosis  Date   Allergy    Anxiety    Arthritis    Bimalleolar ankle fracture, right, closed, initial encounter    Breast cancer (HCC) 1994   lumpectomy and XRT, no chemo   CAD (coronary artery disease) 2008   PTCA, in [place]   GERD (gastroesophageal reflux disease)    History of chicken pox    Hyperlipidemia    Hypertension    Myocardial infarction (HCC) 03/15/2007   stent placed   Vitamin D  deficiency    Past Surgical History:   Procedure Laterality Date   BREAST BIOPSY Left 1994   BREAST LUMPECTOMY Left 1994   CARDIOVERSION N/A 07/15/2023   Procedure: CARDIOVERSION;  Surgeon: Euell Herrlich, MD;  Location: [facility];  Service: Cardiovascular;  Laterality: N/A;   CATARACT EXTRACTION, BILATERAL     CORONARY ANGIOPLASTY WITH STENT PLACEMENT  2008   CRANIECTOMY FOR DEPRESSED SKULL FRACTURE  1986   ORIF ANKLE FRACTURE Right 06/20/2023   Procedure: RIGHT OPEN REDUCTION INTERNAL FIXATION (ORIF) ANKLE FRACTURE;  Surgeon: Wilhelmenia Harada, MD;  Location: [facility];  Service: Orthopedics;  Laterality: Right;   TONSILLECTOMY  1966   Family History  Problem Relation Age of Onset   Breast cancer Mother    Hypertension Mother    Heart disease Mother    Stroke Mother    Arthritis Father    Heart disease Father        died at 26 of heart attack   Heart disease Brother    Stroke Maternal Grandmother    Heart disease Maternal Grandfather    Colon cancer Neg Hx    Esophageal cancer Neg Hx    Rectal cancer Neg Hx    Stomach cancer Neg Hx    Social History   Socioeconomic History   Marital status: Married    Spouse name: Not on file   Number of children: Not on file   Years of education: Not on file   Highest education level: Some college, no degree  Occupational History   Not on file  Tobacco Use   Smoking status: Never    Passive exposure: Never   Smokeless tobacco: Never  Vaping Use   Vaping status: Never Used  Substance and Sexual Activity   Alcohol use: No   Drug use: No   Sexual activity: Not Currently    Birth control/protection: Post-menopausal  Other Topics Concern   Not on file  Social History Narrative   Married, lives with spouse   Retired from [employer] irregularly, rides an exercise bike   Social Drivers of Health   Financial Resource Strain: Low Risk  (09/27/2023)   Overall Financial Resource Strain (CARDIA)    Difficulty of Paying Living  Expenses: Not hard at all  Food Insecurity: No Food Insecurity (09/27/2023)   Hunger Vital Sign    Worried About Running Out of Food in the Last Year: Never true    Ran Out of Food in the Last Year: Never true  Transportation Needs: No Transportation Needs (09/27/2023)   PRAPARE - Administrator, Civil Service (Medical): No    Lack of Transportation (Non-Medical): No  Physical Activity: Insufficiently Active (09/27/2023)   Exercise Vital Sign    Days of Exercise per Week: 3 days    Minutes of Exercise per Session: 20 min  Stress: No Stress Concern Present (09/27/2023)   [employer] of Occupational Health - Occupational Stress Questionnaire  Feeling of Stress : Not at all  Social Connections: Moderately Integrated (09/27/2023)   Social Connection and Isolation Panel [NHANES]    Frequency of Communication with Friends and Family: More than three times a week    Frequency of Social Gatherings with Friends and Family: Twice a week    Attends Religious Services: More than 4 times per year    Active Member of [employer] or Organizations: No    Attends Engineer, structural: Never    Marital Status: Married    Tobacco Counseling Counseling given: No    Clinical Intake:  Pre-visit preparation completed: Yes  Pain : No/denies pain     BMI - recorded: 25.51 Nutritional Status: BMI 25 -29 Overweight Nutritional Risks: None Diabetes: No  Lab Results  Component Value Date   HGBA1C 5.8 01/28/2023   HGBA1C 5.8 08/23/2022   HGBA1C 5.8 02/15/2022     How often do you need to have someone help you when you read instructions, pamphlets, or other written materials from your doctor or pharmacy?: 1 - Never  Interpreter Needed?: No  Information entered by :: Kandy Orris, CMA   Activities of Daily Living     09/27/2023   11:31 AM 09/22/2023    7:34 AM  In your present state of health, do you have any difficulty performing the following activities:  Hearing? 0 0   Vision? 0 0  Difficulty concentrating or making decisions? 0 0  Walking or climbing stairs? 0 0  Dressing or bathing? 0 0  Doing errands, shopping? 0 0  Preparing Food and eating ? N N  Using the Toilet? N N  In the past six months, have you accidently leaked urine? N N  Do you have problems with loss of bowel control? N N  Managing your Medications? N N  Managing your Finances? N N  Housekeeping or managing your Housekeeping? N N    Patient Care Team: Colene Dauphin, MD as PCP - General (Internal Medicine) Sheryle Donning, MD as PCP - Cardiology (Cardiology) Alto Atta Scot Cutter, MD as Consulting Physician (Ophthalmology) Szabat, Tino Foreman, [facility] (Inactive) as Pharmacist (Pharmacist)  Indicate any recent Medical Services you may have received from other than [facility] providers in the past year (date may be approximate).     Assessment:    This is a routine wellness examination for Loris.  Hearing/Vision screen Hearing Screening - Comments:: Denies hearing difficulties   Vision Screening - Comments:: Wears rx glasses - up to date with routine eye exams with Dr Alto Atta    Goals Addressed               This Visit's Progress     Patient Stated (pt-stated)        Patient stated she is healing from having a fractured a right foot and stay active.         Depression Screen     09/27/2023   11:35 AM 08/19/2023   10:48 AM 07/04/2023    3:39 PM 01/28/2023    3:55 PM 09/23/2022    1:08 PM 08/23/2022    8:40 AM 05/25/2022    9:16 AM  PHQ 2/9 Scores  PHQ - 2 Score 0 0 0 0 0 0 0  PHQ- 9 Score 2   0 0  0    Fall Risk     09/27/2023   11:31 AM 09/22/2023    7:34 AM 08/19/2023   10:48 AM 07/04/2023    3:39 PM 01/28/2023  3:55 PM  Fall Risk   Falls in the past year? 1 1 0 0 1  Number falls in past yr: 1 1 0 0 0  Comment 3      Injury with Fall? 1 1 0 0 1  Comment right foot fracture      Risk for fall due to :   No Fall Risks No Fall Risks No Fall Risks  Follow up Falls  evaluation completed;Falls prevention discussed  Falls evaluation completed Falls evaluation completed Falls evaluation completed    MEDICARE RISK AT HOME:  Medicare Risk at Home Any stairs in or around the home?: Yes If so, are there any without handrails?: No Home free of loose throw rugs in walkways, pet beds, electrical cords, etc?: Yes Adequate lighting in your home to reduce risk of falls?: Yes Life alert?: No Use of a cane, walker or w/c?: Yes (cane/walker) Grab bars in the bathroom?: Yes Shower chair or bench in shower?: Yes Elevated toilet seat or a handicapped toilet?: Yes  TIMED UP AND GO:  Was the test performed?  No  Cognitive Function: 6CIT completed        09/27/2023   11:36 AM 09/23/2022    1:06 PM 10/01/2021    9:08 AM  6CIT Screen  What Year? 0 points 0 points 0 points  What month? 0 points 0 points 0 points  What time? 0 points 0 points 0 points  Count back from 20 0 points 0 points 0 points  Months in reverse 0 points 0 points 0 points  Repeat phrase 0 points 0 points 0 points  Total Score 0 points 0 points 0 points    Immunizations Immunization History  Administered Date(s) Administered   Fluad Quad(high Dose 65+) 02/15/2019, 02/28/2020, 02/09/2021, 02/15/2022   Fluad Trivalent(High Dose 65+) 01/28/2023   Influenza, High Dose Seasonal PF 02/15/2017, 02/13/2018   Influenza-Unspecified 03/17/2014, 02/16/2015, 02/29/2016   Moderna Sars-Covid-2 Vaccination 07/13/2019, 08/10/2019, 05/22/2020   Pneumococcal Conjugate-13 05/30/2014   Pneumococcal Polysaccharide-23 09/26/2013   Tdap 02/13/2018    Screening Tests Health Maintenance  Topic Date Due   COVID-19 Vaccine (4 - 2024-25 season) 10/13/2023 (Originally 01/16/2023)   Zoster Vaccines- Shingrix (1 of 2) 12/28/2023 (Originally 02/17/1965)   INFLUENZA VACCINE  12/16/2023   Medicare Annual Wellness (AWV)  09/26/2024   DEXA SCAN  08/29/2025   DTaP/Tdap/Td (2 - Td or Tdap) 02/14/2028   Pneumonia Vaccine  84+ Years old  Completed   Hepatitis C Screening  Completed   HPV VACCINES  Aged Out   Meningococcal B Vaccine  Aged Out   Colonoscopy  Discontinued    Health Maintenance  There are no preventive care reminders to display for this patient.  Health Maintenance Items Addressed: 09/27/2023   Additional Screening:  Vision Screening: Recommended annual ophthalmology exams for early detection of glaucoma and other disorders of the eye.  Pt stated she has an appt w/Dr Alto Atta on 11/15/2023.  Dental Screening: Recommended annual dental exams for proper oral hygiene  Community Resource Referral / Chronic Care Management: CRR required this visit?  No   CCM required this visit?  No   Plan:    I have personally reviewed and noted the following in the patient's chart:   Medical and social history Use of alcohol, tobacco or illicit drugs  Current medications and supplements including opioid prescriptions. Patient is currently taking opioid prescriptions. Information provided to patient regarding non-opioid alternatives. Patient advised to discuss non-opioid treatment plan with their provider.  Functional ability and status Nutritional status Physical activity Advanced directives List of other physicians Hospitalizations, surgeries, and ER visits in previous 12 months Vitals Screenings to include cognitive, depression, and falls Referrals and appointments  In addition, I have reviewed and discussed with patient certain preventive protocols, quality metrics, and best practice recommendations. A written personalized care plan for preventive services as well as general preventive health recommendations were provided to patient.   Patria Bookbinder, CMA   09/27/2023   After Visit Summary: (MyChart) Due to this being a telephonic visit, the after visit summary with patients personalized plan was offered to patient via MyChart   Notes: Nothing significant to report at this time.

## 2023-09-30 ENCOUNTER — Other Ambulatory Visit: Payer: Self-pay

## 2023-09-30 MED ORDER — APIXABAN 5 MG PO TABS: 5.0000 mg | ORAL_TABLET | Freq: Two times a day (BID) | ORAL | 1 refills | Status: DC

## 2023-09-30 NOTE — Telephone Encounter (Signed)
Eliquis refill please °

## 2023-09-30 NOTE — Telephone Encounter (Signed)
 Prescription refill request for Eliquis  received. Indication:afib Last office visit:3/25 Scr:0.97  5/25 Age: 78 Weight:65.3  kg  Prescription refilled

## 2023-10-04 ENCOUNTER — Telehealth: Payer: Self-pay | Admitting: Sports Medicine

## 2023-10-04 NOTE — Telephone Encounter (Signed)
 Patient called and says she came in for her right knee 2 months ago and Dr. Cleora Daft said if she is not feeling better to call and he would put the gel in it and that she needs something . Please Advise.

## 2023-10-04 NOTE — Telephone Encounter (Addendum)
 Patient was given Zilretta  at last visit on 08/19/23. Dr Cleora Daft states: If pain returns before 3 months call our clinic and ask for gel shot approval  If it returns after 3 months call and ask for zilretta       Can we run for gel?  (Patient scheduled an appointment through MyChart for this Friday but I have let her know that we may not have authorization back before then.)

## 2023-10-04 NOTE — Telephone Encounter (Signed)
 Approval for Zilretta  was given 07/30/23. Patient can proceed with injections.

## 2023-10-05 NOTE — Telephone Encounter (Signed)
 Synvisc has been faxed for approval 10/05/23. Pending authorization.

## 2023-10-06 NOTE — Progress Notes (Deleted)
    Sarah Hendricks D.[facility] [address] [place] [address] Phone: [phone]   Assessment and Plan:     There are no diagnoses linked to this encounter.  ***   Pertinent previous records reviewed include ***    Follow Up: ***     Subjective:   I, Sarah Hendricks, am serving as a Neurosurgeon for Doctor Ulysees Gander   Chief Complaint: multiple body parts    HPI:    12/20/22 Patient is a 78 year old female complaining of left ankle pain. Patient states she was getting out of bed and she fell last Sunday night pain in her left leg . Has bruising. Does have some shoulder pain but she states that is okay. Tylenol  for the pain once a day. Pain does radiate up and down the leg but she is more afraid for a blood clot    01/11/2023 Patient states doing better, but right shoulder is still bothering her. Patient states the meloxicam  not ure if it helped but it made her really dizzy.   02/24/2023 Patient states that her ankle is fine. Knee , shoulder and hip are flared    03/15/2023 Patient states right knee is doing terrible. Pain has been keeping her up at night . Meloxicam  is no longer helping    03/24/2023 Patient states   08/05/2023 Patient states  R knee,  R shoulder and L hip not good today. Nothing new to flare it up. Knee locked up the other day without cause.    08/19/2023 Patient states ready for shot   10/07/2023 Patient states     Relevant Historical Information: Hypertension, osteoporosis, prediabete    Additional pertinent review of systems negative.   Current Outpatient Medications:    apixaban  (ELIQUIS ) 5 MG TABS tablet, Take 1 tablet (5 mg total) by mouth 2 (two) times daily., Disp: 180 tablet, Rfl: 1   Cholecalciferol  (EQL VITAMIN D3) 1000 units tablet, Take 1 tablet (1,000 Units total) by mouth daily., Disp: , Rfl:    escitalopram  (LEXAPRO ) 10 MG tablet, TAKE 1 TABLET(10 MG) BY MOUTH DAILY, Disp: 90 tablet, Rfl: 1    furosemide  (LASIX ) 20 MG tablet, Take one tablet 3 times per week, Disp: 30 tablet, Rfl: 11   losartan  (COZAAR ) 100 MG tablet, TAKE 1 TABLET(100 MG) BY MOUTH DAILY, Disp: 90 tablet, Rfl: 3   propranolol  (INDERAL ) 80 MG tablet, Take 1 tablet (80 mg total) by mouth 2 (two) times daily., Disp: 180 tablet, Rfl: 3   rosuvastatin  (CRESTOR ) 10 MG tablet, Take 1 tablet (10 mg total) by mouth daily., Disp: 90 tablet, Rfl: 3   traMADol  (ULTRAM ) 50 MG tablet, Take 1 tablet (50 mg total) by mouth every 6 (six) hours as needed., Disp: 30 tablet, Rfl: 0  Current Facility-Administered Medications:    denosumab  (PROLIA ) injection 60 mg, 60 mg, Subcutaneous, Once,    Objective:     There were no vitals filed for this visit.    There is no height or weight on file to calculate BMI.    Physical Exam:    ***   Electronically signed by:  Marshall Skeeter D.[facility] 7:43 AM 10/06/23

## 2023-10-07 ENCOUNTER — Ambulatory Visit: Admitting: Sports Medicine

## 2023-10-11 ENCOUNTER — Other Ambulatory Visit (HOSPITAL_BASED_OUTPATIENT_CLINIC_OR_DEPARTMENT_OTHER): Payer: Self-pay

## 2023-10-11 MED ORDER — FUROSEMIDE 20 MG PO TABS: ORAL_TABLET | ORAL | Status: DC

## 2023-10-11 NOTE — Telephone Encounter (Signed)
 Okay to adjust Lasix  to 20mg  4 times per week. Please update prescription and place BMET order. Please ensure Eliquis  refill sent.   Raneshia Derick S Jacobs Golab, NP

## 2023-10-11 NOTE — Telephone Encounter (Signed)
 Patient scheduled for this Friday.  Would patient use Synvisc One or Synvisc Series?

## 2023-10-13 NOTE — Telephone Encounter (Signed)
 Noted and labeled.

## 2023-10-13 NOTE — Telephone Encounter (Signed)
 Approval is at [employer].

## 2023-10-13 NOTE — Progress Notes (Unsigned)
    Ben Jackson D.[facility] [address] [place] [address] Phone: [phone]   Assessment and Plan:     There are no diagnoses linked to this encounter.  ***   Pertinent previous records reviewed include ***    Follow Up: ***     Subjective:   I, Darnisha Vernet, am serving as a Neurosurgeon for Doctor Ulysees Gander   Chief Complaint: multiple body parts    HPI:    12/20/22 Patient is a 78 year old female complaining of left ankle pain. Patient states she was getting out of bed and she fell last Sunday night pain in her left leg . Has bruising. Does have some shoulder pain but she states that is okay. Tylenol  for the pain once a day. Pain does radiate up and down the leg but she is more afraid for a blood clot    01/11/2023 Patient states doing better, but right shoulder is still bothering her. Patient states the meloxicam  not ure if it helped but it made her really dizzy.   02/24/2023 Patient states that her ankle is fine. Knee , shoulder and hip are flared    03/15/2023 Patient states right knee is doing terrible. Pain has been keeping her up at night . Meloxicam  is no longer helping    03/24/2023 Patient states   08/05/2023 Patient states  R knee,  R shoulder and L hip not good today. Nothing new to flare it up. Knee locked up the other day without cause.    08/19/2023 Patient states ready for shot    10/14/2023 Patient states   Relevant Historical Information: Hypertension, osteoporosis, prediabete  Additional pertinent review of systems negative.   Current Outpatient Medications:    apixaban  (ELIQUIS ) 5 MG TABS tablet, Take 1 tablet (5 mg total) by mouth 2 (two) times daily., Disp: 180 tablet, Rfl: 1   Cholecalciferol  (EQL VITAMIN D3) 1000 units tablet, Take 1 tablet (1,000 Units total) by mouth daily., Disp: , Rfl:    escitalopram  (LEXAPRO ) 10 MG tablet, TAKE 1 TABLET(10 MG) BY MOUTH DAILY, Disp: 90 tablet, Rfl: 1    furosemide  (LASIX ) 20 MG tablet, Take one tablet 4 times per week, Disp: , Rfl:    losartan  (COZAAR ) 100 MG tablet, TAKE 1 TABLET(100 MG) BY MOUTH DAILY, Disp: 90 tablet, Rfl: 3   propranolol  (INDERAL ) 80 MG tablet, Take 1 tablet (80 mg total) by mouth 2 (two) times daily., Disp: 180 tablet, Rfl: 3   rosuvastatin  (CRESTOR ) 10 MG tablet, Take 1 tablet (10 mg total) by mouth daily., Disp: 90 tablet, Rfl: 3   traMADol  (ULTRAM ) 50 MG tablet, Take 1 tablet (50 mg total) by mouth every 6 (six) hours as needed., Disp: 30 tablet, Rfl: 0  Current Facility-Administered Medications:    denosumab  (PROLIA ) injection 60 mg, 60 mg, Subcutaneous, Once,    Objective:     There were no vitals filed for this visit.    There is no height or weight on file to calculate BMI.    Physical Exam:    ***   Electronically signed by:  Marshall Skeeter D.[facility] 7:41 AM 10/13/23

## 2023-10-14 ENCOUNTER — Ambulatory Visit: Admitting: Sports Medicine

## 2023-10-14 VITALS — BP 124/86 | HR 74 | Ht 63.0 in | Wt 144.0 lb

## 2023-10-14 DIAGNOSIS — M1711 Unilateral primary osteoarthritis, right knee: Secondary | ICD-10-CM | POA: Diagnosis not present

## 2023-10-14 DIAGNOSIS — G8929 Other chronic pain: Secondary | ICD-10-CM | POA: Diagnosis not present

## 2023-10-14 DIAGNOSIS — M25561 Pain in right knee: Secondary | ICD-10-CM | POA: Diagnosis not present

## 2023-10-14 MED ORDER — HYLAN G-F 20 16 MG/2ML IX SOSY
16.0000 mg | PREFILLED_SYRINGE | Freq: Once | INTRA_ARTICULAR | Status: AC
  Administered 2023-10-14: 16 mg via INTRA_ARTICULAR

## 2023-10-14 NOTE — Telephone Encounter (Signed)
 Synvisc authorized for series or single injection right knee Authorization number [id number] 10/05/23-01/02/24

## 2023-10-14 NOTE — Patient Instructions (Signed)
5-6 week follow up Tylenol [phone] mg 2-3 times a day for pain relief

## 2023-10-17 ENCOUNTER — Other Ambulatory Visit (HOSPITAL_COMMUNITY): Payer: Self-pay | Admitting: Internal Medicine

## 2023-10-17 DIAGNOSIS — I10 Essential (primary) hypertension: Secondary | ICD-10-CM | POA: Diagnosis not present

## 2023-10-18 ENCOUNTER — Ambulatory Visit (HOSPITAL_BASED_OUTPATIENT_CLINIC_OR_DEPARTMENT_OTHER): Payer: Self-pay | Admitting: Family

## 2023-10-18 LAB — BASIC METABOLIC PANEL WITH GFR
BUN/Creatinine Ratio: 13 (ref 12–28)
BUN: 13 mg/dL (ref 8–27)
CO2: 21 mmol/L (ref 20–29)
Calcium: 9.1 mg/dL (ref 8.7–10.3)
Chloride: 98 mmol/L (ref 96–106)
Creatinine, Ser: 1 mg/dL (ref 0.57–1.00)
Glucose: 135 mg/dL — ABNORMAL HIGH (ref 70–99)
Potassium: 4.6 mmol/L (ref 3.5–5.2)
Sodium: 133 mmol/L — ABNORMAL LOW (ref 134–144)
eGFR: 58 mL/min/{1.73_m2} — ABNORMAL LOW (ref >59)

## 2023-10-19 ENCOUNTER — Other Ambulatory Visit: Payer: Self-pay | Admitting: Internal Medicine

## 2023-10-19 DIAGNOSIS — Z1231 Encounter for screening mammogram for malignant neoplasm of breast: Secondary | ICD-10-CM

## 2023-10-20 ENCOUNTER — Encounter: Payer: Self-pay | Admitting: Internal Medicine

## 2023-10-20 DIAGNOSIS — F0390 Unspecified dementia without behavioral disturbance: Secondary | ICD-10-CM

## 2023-10-21 ENCOUNTER — Other Ambulatory Visit (HOSPITAL_BASED_OUTPATIENT_CLINIC_OR_DEPARTMENT_OTHER): Payer: Self-pay

## 2023-10-21 ENCOUNTER — Other Ambulatory Visit (HOSPITAL_BASED_OUTPATIENT_CLINIC_OR_DEPARTMENT_OTHER): Payer: Self-pay | Admitting: Family

## 2023-10-24 ENCOUNTER — Other Ambulatory Visit (HOSPITAL_BASED_OUTPATIENT_CLINIC_OR_DEPARTMENT_OTHER): Payer: Self-pay

## 2023-10-24 ENCOUNTER — Ambulatory Visit (HOSPITAL_BASED_OUTPATIENT_CLINIC_OR_DEPARTMENT_OTHER): Payer: Medicare Other | Admitting: Cardiology

## 2023-10-24 ENCOUNTER — Encounter (HOSPITAL_BASED_OUTPATIENT_CLINIC_OR_DEPARTMENT_OTHER): Payer: Self-pay | Admitting: Cardiology

## 2023-10-24 VITALS — BP 122/58 | HR 67 | Ht 63.0 in | Wt 149.9 lb

## 2023-10-24 DIAGNOSIS — D6859 Other primary thrombophilia: Secondary | ICD-10-CM | POA: Diagnosis not present

## 2023-10-24 DIAGNOSIS — I480 Paroxysmal atrial fibrillation: Secondary | ICD-10-CM

## 2023-10-24 DIAGNOSIS — I10 Essential (primary) hypertension: Secondary | ICD-10-CM

## 2023-10-24 DIAGNOSIS — Z7901 Long term (current) use of anticoagulants: Secondary | ICD-10-CM

## 2023-10-24 DIAGNOSIS — E782 Mixed hyperlipidemia: Secondary | ICD-10-CM

## 2023-10-24 DIAGNOSIS — Z7189 Other specified counseling: Secondary | ICD-10-CM

## 2023-10-24 DIAGNOSIS — I5042 Chronic combined systolic (congestive) and diastolic (congestive) heart failure: Secondary | ICD-10-CM | POA: Diagnosis not present

## 2023-10-24 MED ORDER — FUROSEMIDE 20 MG PO TABS
ORAL_TABLET | ORAL | 3 refills | Status: DC
  Filled 2023-10-24: qty 48, 90d supply, fill #0
  Filled 2024-01-06: qty 48, 90d supply, fill #1
  Filled 2024-04-11: qty 48, 90d supply, fill #2

## 2023-10-24 NOTE — Progress Notes (Signed)
 Cardiology Office Note:  .   Date:  10/24/2023  ID:  Sarah Hendricks, DOB 10/06/1945, MRN [id number] PCP: Sarah Dauphin, MD  [facility] Providers Cardiologist:  Sarah Donning, MD {  History of Present Illness: .   Sarah Hendricks is a 78 y.o. female with a hx of paroxysmal atrial fibrillation, HTN, HLD, CAD s/p PCI in 2008 (done in [place] prior to moving), and breast cancer s/p lumpectomy and XRT (no chemo) . She was previously followed by Dr. Nicholette Hendricks and Dr. Ardell Hendricks. I met her virtually 06/15/2023.  Pertinent CV history: Admitted 05/2023 after a fall from lightheadedness, found to have new afib. Echo 05/30/23 LVEF 45-50%, mild LVH, RV normal. LVEF similar to prior echo 11/2022. Presented to ER 07/03/23 with dyspnea and edema, in afib, started on lasix . Had cardioversion 07/15/23 with improvement in her symptoms.   Today: Overall doing well except last week, felt "blah." Was tired, not sleeping well, no energy. No fevers/chills. Thought she might be getting afib but never developed. No bleeding. No major leg swelling, has chronic ankle bursa enlargement that is unchanged.   Has done well since cardioversion. Taking lasix  4 times/week with good results. Labs stable last week.  Walks outside the house and inside, somewhat limited by ankle and knee issues.  ROS: Denies chest pain, shortness of breath at rest or with normal exertion. No PND, orthopnea, change in LE edema or unexpected weight gain. No syncope or palpitations. ROS otherwise negative except as noted.   Studies Reviewed: Aaron Aas    EKG:       Physical Exam:   VS:  BP (!) 122/58 (BP Location: Left Arm, Patient Position: Sitting, Cuff Size: Normal)   Pulse 67   Ht 5\' 3"  (1.6 m)   Wt 149 lb 14.4 oz (68 kg)   SpO2 93%   BMI 26.55 kg/m    Wt Readings from Last 3 Encounters:  10/24/23 149 lb 14.4 oz (68 kg)  10/14/23 144 lb (65.3 kg)  09/27/23 144 lb (65.3 kg)    GEN: Well nourished, well developed in no acute  distress HEENT: Normal, moist mucous membranes NECK: No JVD CARDIAC: regular rhythm, normal S1 and S2, no rubs or gallops. No murmur. VASCULAR: Radial and DP pulses 2+ bilaterally. No carotid bruits RESPIRATORY:  Clear to auscultation without rales, wheezing or rhonchi  ABDOMEN: Soft, non-tender, non-distended MUSCULOSKELETAL:  Ambulates independently SKIN: Warm and dry, no edema. Prominent ankle bursa bilaterally NEUROLOGIC:  Alert and oriented x 3. No focal neuro deficits noted. PSYCHIATRIC:  Normal affect    ASSESSMENT AND PLAN: .    Paroxysmal atrial fibrillation -symptomatic when she is in afib -chadsvasc=6, secondary hypercoagulable state -continue DOAC -continue propranolol  -discussed medications and possible ablation if this recurs, given her symptoms when she is in afib -has Kardia mobile   Chronic systolic and diastolic heart failure -symptomatic when in afib -continue propranolol , losartan  -currently on lasix  3-4 times/week  Hypertension -continue losartan , propranolol  -hydrochlorothiazide  previously caused worsening hyponatremia -reviewed recent BMET  CAD with prior PCI 2008 Hyperlipidemia -no aspirin as she is on DOAC -no angina -continue propranolol , rosuvastatin  -last lipids 01/2023 LDL 54, TG 162 (prior TG 69)  CV risk counseling and prevention -recommend heart healthy/Mediterranean diet, with whole grains, fruits, vegetable, fish, lean meats, nuts, and olive oil. Limit salt. -recommend moderate walking, 3-5 times/week for 30-50 minutes each session. Aim for at least 150 minutes.week. Goal should be pace of 3 miles/hours, or walking 1.5 miles in 30 minutes -recommend  avoidance of tobacco products. Avoid excess alcohol.  Dispo: 6 mos or sooner as needed  Signed, Sarah Donning, MD   Sarah Donning, MD, PhD, [facility] [place]  [facility]  [place]  [facility] at [facility] [address] [place], [place] [address] [phone]

## 2023-10-24 NOTE — Patient Instructions (Signed)
 Medication Instructions:  Your physician recommends that you continue on your current medications as directed. Please refer to the Current Medication list given to you today.   Follow-Up: Please follow up in 6 months with Dr. Veryl Gottron, Slater Duncan, NP or Neomi Banks, NP

## 2023-11-10 ENCOUNTER — Telehealth: Payer: Self-pay | Admitting: Sports Medicine

## 2023-11-10 NOTE — Telephone Encounter (Signed)
 Pt husband called, concerned about her knee pain. I spoke with pt, pain is not well controlled with Tylenol . She feels a bit worse since the gel injection on May 30.  Has visit scheduled for 7/7 with us , unsure if we could offer any advice or if appt should be moved up.

## 2023-11-11 NOTE — Telephone Encounter (Signed)
 Spoke to patient this morning.  She said that her knee is feeling a little better this morning and that she almost feels like it is spasming.   She is currently scheduled for 7/7 and is not able to come on 7/3 (our first available). We will reach out with earlier cancellations has they become available.

## 2023-11-14 ENCOUNTER — Ambulatory Visit: Payer: Self-pay

## 2023-11-14 ENCOUNTER — Ambulatory Visit: Payer: Self-pay | Admitting: Internal Medicine

## 2023-11-14 ENCOUNTER — Ambulatory Visit (INDEPENDENT_AMBULATORY_CARE_PROVIDER_SITE_OTHER): Admitting: Internal Medicine

## 2023-11-14 ENCOUNTER — Other Ambulatory Visit (INDEPENDENT_AMBULATORY_CARE_PROVIDER_SITE_OTHER)

## 2023-11-14 ENCOUNTER — Encounter: Payer: Self-pay | Admitting: Internal Medicine

## 2023-11-14 ENCOUNTER — Ambulatory Visit (INDEPENDENT_AMBULATORY_CARE_PROVIDER_SITE_OTHER)
Admission: RE | Admit: 2023-11-14 | Discharge: 2023-11-14 | Disposition: A | Discharge status: Home / Self Care | Source: Ambulatory Visit | Attending: Internal Medicine

## 2023-11-14 VITALS — BP 140/70 | HR 74 | Temp 98.2°F | Ht 63.0 in | Wt 151.0 lb

## 2023-11-14 DIAGNOSIS — I4891 Unspecified atrial fibrillation: Secondary | ICD-10-CM | POA: Diagnosis not present

## 2023-11-14 DIAGNOSIS — J90 Pleural effusion, not elsewhere classified: Secondary | ICD-10-CM | POA: Diagnosis not present

## 2023-11-14 DIAGNOSIS — R0609 Other forms of dyspnea: Secondary | ICD-10-CM

## 2023-11-14 DIAGNOSIS — J42 Unspecified chronic bronchitis: Secondary | ICD-10-CM | POA: Diagnosis not present

## 2023-11-14 LAB — COMPREHENSIVE METABOLIC PANEL WITH GFR
ALT: 13 U/L (ref 0–35)
AST: 20 U/L (ref 0–37)
Albumin: 4.1 g/dL (ref 3.5–5.2)
Alkaline Phosphatase: 74 U/L (ref 39–117)
BUN: 16 mg/dL (ref 6–23)
CO2: 29 meq/L (ref 19–32)
Calcium: 9.6 mg/dL (ref 8.4–10.5)
Chloride: 96 meq/L (ref 96–112)
Creatinine, Ser: 0.93 mg/dL (ref 0.40–1.20)
GFR: 59.61 mL/min — ABNORMAL LOW (ref >60.00)
Glucose, Bld: 95 mg/dL (ref 70–99)
Potassium: 3.9 meq/L (ref 3.5–5.1)
Sodium: 133 meq/L — ABNORMAL LOW (ref 135–145)
Total Bilirubin: 0.6 mg/dL (ref 0.2–1.2)
Total Protein: 6.9 g/dL (ref 6.0–8.3)

## 2023-11-14 LAB — BRAIN NATRIURETIC PEPTIDE: Pro B Natriuretic peptide (BNP): 429 pg/mL — ABNORMAL HIGH (ref 0.0–100.0)

## 2023-11-14 NOTE — Telephone Encounter (Signed)
 Pt triaged earlier, addended previous encounter see previous encounter.

## 2023-11-14 NOTE — Assessment & Plan Note (Addendum)
 Acute Started 3 days ago and has gotten worse Only with exertion-denies shortness of breath with rest Has had some chills, mild cough, wheezing, palpitations, slightly increased edema Concern for CHF exacerbation, atrial fibrillation, pneumonia less likely Chest x-ray today EKG today CBC, CMP, BNP Increase Lasix  to 40 mg daily x 3 days and then 20 mg daily (currently taking Lasix  20 mg every other day Needs follow-up in 1 week-will see if she can schedule with cardiology ideally, if not we will schedule with our office

## 2023-11-14 NOTE — Assessment & Plan Note (Signed)
 Chronic Following with cardiology s/p cardioversion On Eliquis  5 mg twice daily and propranolol  80 mg twice daily Has not felt any palpitations since the cardioversion, so A-fib less likely to be the cause of her symptoms

## 2023-11-14 NOTE — Progress Notes (Signed)
 Subjective:    Patient ID: Sarah Hendricks, female    DOB: 01/11/1946, 78 y.o.   MRN: [id number]      HPI Sarah Hendricks is here for  Chief Complaint  Patient presents with   Shortness of Breath    SOB that start last Friday    Shortness of breath with exertion-  started last Friday - three days ago.  Started slow and got worse.  He denies any shortness of breath at rest.  She has had some chills, some cough.  He states leg swelling it may be slightly worse.  She denies any weight gain at home and she does weigh herself regularly.  She has has had some palpitations and wheezing.  Denies any chest pain or fevers.  She denies cold symptoms.   Echocardiogram 05/2023: EF 45-50%, mild LVH.  Diastolic parameters indeterminate.  Medications and allergies reviewed with patient and updated if appropriate.  Current Outpatient Medications on File Prior to Visit  Medication Sig Dispense Refill   apixaban  (ELIQUIS ) 5 MG TABS tablet Take 1 tablet (5 mg total) by mouth 2 (two) times daily. 180 tablet 1   Cholecalciferol  (EQL VITAMIN D3) 1000 units tablet Take 1 tablet (1,000 Units total) by mouth daily.     escitalopram  (LEXAPRO ) 10 MG tablet TAKE 1 TABLET(10 MG) BY MOUTH DAILY 90 tablet 1   furosemide  (LASIX ) 20 MG tablet Take one tablet 4 times per week 48 tablet 3   losartan  (COZAAR ) 100 MG tablet TAKE 1 TABLET(100 MG) BY MOUTH DAILY 90 tablet 3   propranolol  (INDERAL ) 80 MG tablet Take 1 tablet (80 mg total) by mouth 2 (two) times daily. 180 tablet 3   rosuvastatin  (CRESTOR ) 10 MG tablet Take 1 tablet (10 mg total) by mouth daily. 90 tablet 3   traMADol  (ULTRAM ) 50 MG tablet Take 1 tablet (50 mg total) by mouth every 6 (six) hours as needed. 30 tablet 0   Current Facility-Administered Medications on File Prior to Visit  Medication Dose Route Frequency Provider Last Rate Last Admin   denosumab  (PROLIA ) injection 60 mg  60 mg Subcutaneous Once         Review of Systems  Constitutional:   Positive for chills. Negative for appetite change and fever.  HENT:  Negative for congestion, ear pain, postnasal drip, sinus pain and sore throat.        Dry mouth  Respiratory:  Positive for cough (some), shortness of breath (with exertion) and wheezing (a little).   Cardiovascular:  Positive for palpitations and leg swelling (maybe increased). Negative for chest pain.  Neurological:  Negative for light-headedness and headaches.       Objective:   Vitals:   11/14/23 1557  BP: (!) 140/70  Pulse: 74  Temp: 98.2 F (36.8 C)  SpO2: 94%   BP Readings from Last 3 Encounters:  11/14/23 (!) 140/70  10/24/23 (!) 122/58  10/14/23 124/86   Wt Readings from Last 3 Encounters:  11/14/23 151 lb (68.5 kg)  10/24/23 149 lb 14.4 oz (68 kg)  10/14/23 144 lb (65.3 kg)   Body mass index is 26.75 kg/m.    Physical Exam Constitutional:      General: She is not in acute distress.    Appearance: Normal appearance.  HENT:     Head: Normocephalic and atraumatic.   Eyes:     Conjunctiva/sclera: Conjunctivae normal.    Cardiovascular:     Rate and Rhythm: Normal rate and regular rhythm.  Heart sounds: Normal heart sounds.  Pulmonary:     Effort: Pulmonary effort is normal. No respiratory distress.     Breath sounds: Rales (Bibasilar) present. No wheezing.   Musculoskeletal:     Cervical back: Neck supple.     Right lower leg: Edema (Trace) present.     Left lower leg: Edema (Trace) present.  Lymphadenopathy:     Cervical: No cervical adenopathy.   Skin:    General: Skin is warm and dry.     Findings: No rash.   Neurological:     Mental Status: She is alert. Mental status is at baseline.   Psychiatric:        Mood and Affect: Mood normal.        Behavior: Behavior normal.        EKG: NSR at 70 bpm, probable LVH.  No significant change from EKG from 07/2023    Assessment & Plan:    See Problem List for Assessment and Plan of chronic medical problems.

## 2023-11-14 NOTE — Patient Instructions (Addendum)
     [address] [place],  [place]  [address]   Blood work was ordered.   Chest xray ordered.      Medications changes include :   take lasix  40 mg daily x 3 days then take 20 mg daily

## 2023-11-14 NOTE — Telephone Encounter (Signed)
 FYI Only or Action Required?: FYI only for provider.  Patient was last seen in primary care on 08/19/2023 by Geofm Glade PARAS, MD. Called Nurse Triage reporting Shortness of Breath. Symptoms began several days ago. Interventions attempted: Nothing. Symptoms are: stable.  Triage Disposition: See HCP Within 4 Hours (Or PCP Triage)  Patient/caregiver understands and will follow disposition?: Yes  No lung hx,  has SOB with exertion and difficulty sleeping, pt had scheduled appt via Mychart for 300 today but didn't see it scheduled, appt scheduled today for 320 with PCP   Copied from CRM [id number]. Topic: Clinical - Red Word Triage >> Nov 14, 2023  9:46 AM Franky GRADE wrote: Red Word that prompted transfer to Nurse Triage: Patient has been experiencing difficulty breathing for the last couple of days. Reason for Disposition  [1] MILD difficulty breathing (e.g., minimal/no SOB at rest, SOB with walking, pulse <100) AND [2] NEW-onset or WORSE than normal  Answer Assessment - Initial Assessment Questions 1. RESPIRATORY STATUS: Describe your breathing? (e.g., wheezing, shortness of breath, unable to speak, severe coughing)      SOB 2. ONSET: When did this breathing problem begin?      Several days  3. PATTERN Does the difficult breathing come and go, or has it been constant since it started?      Comes and goes with exertion  4. SEVERITY: How bad is your breathing? (e.g., mild, moderate, severe)    - MILD: No SOB at rest, mild SOB with walking, speaks normally in sentences, can lie down, no retractions, pulse < 100.    - MODERATE: SOB at rest, SOB with minimal exertion and prefers to sit, cannot lie down flat, speaks in phrases, mild retractions, audible wheezing, pulse 100-120.    - SEVERE: Very SOB at rest, speaks in single words, struggling to breathe, sitting hunched forward, retractions, pulse > 120      Mild with exertion  7. LUNG HISTORY: Do you have any history of lung disease?  (e.g.,  pulmonary embolus, asthma, emphysema)     No  9. OTHER SYMPTOMS: Do you have any other symptoms? (e.g., dizziness, runny nose, cough, chest pain, fever)     Difficulty sleeping, cough with slight congestion  Protocols used: Breathing Difficulty-A-AH

## 2023-11-14 NOTE — Telephone Encounter (Signed)
 Pt's daughter called she is now with pt and checked her vital and her O2 88 and her HR is 67. BP 141/67. Instructed daughter to take mother to ED and not wait for her appt today.    Reason for Disposition  Oxygen level (e.g., pulse oximetry) 90 percent or lower  Protocols used: Breathing Difficulty-A-AH

## 2023-11-15 ENCOUNTER — Other Ambulatory Visit: Payer: Self-pay

## 2023-11-15 ENCOUNTER — Emergency Department (HOSPITAL_COMMUNITY)

## 2023-11-15 ENCOUNTER — Encounter (HOSPITAL_COMMUNITY): Payer: Self-pay | Admitting: Emergency Medicine

## 2023-11-15 ENCOUNTER — Inpatient Hospital Stay (HOSPITAL_COMMUNITY)
Admission: EM | Admit: 2023-11-15 | Discharge: 2023-11-17 | DRG: 812 | Disposition: A | Discharge status: Home / Self Care | Attending: Internal Medicine | Admitting: Internal Medicine

## 2023-11-15 ENCOUNTER — Telehealth: Payer: Self-pay

## 2023-11-15 DIAGNOSIS — Z79899 Other long term (current) drug therapy: Secondary | ICD-10-CM

## 2023-11-15 DIAGNOSIS — Z823 Family history of stroke: Secondary | ICD-10-CM | POA: Diagnosis not present

## 2023-11-15 DIAGNOSIS — Z853 Personal history of malignant neoplasm of breast: Secondary | ICD-10-CM | POA: Diagnosis not present

## 2023-11-15 DIAGNOSIS — Z955 Presence of coronary angioplasty implant and graft: Secondary | ICD-10-CM | POA: Diagnosis not present

## 2023-11-15 DIAGNOSIS — F419 Anxiety disorder, unspecified: Secondary | ICD-10-CM | POA: Diagnosis present

## 2023-11-15 DIAGNOSIS — Z923 Personal history of irradiation: Secondary | ICD-10-CM | POA: Diagnosis not present

## 2023-11-15 DIAGNOSIS — H353131 Nonexudative age-related macular degeneration, bilateral, early dry stage: Secondary | ICD-10-CM | POA: Diagnosis not present

## 2023-11-15 DIAGNOSIS — D500 Iron deficiency anemia secondary to blood loss (chronic): Secondary | ICD-10-CM

## 2023-11-15 DIAGNOSIS — Z8249 Family history of ischemic heart disease and other diseases of the circulatory system: Secondary | ICD-10-CM | POA: Diagnosis not present

## 2023-11-15 DIAGNOSIS — Z8261 Family history of arthritis: Secondary | ICD-10-CM | POA: Diagnosis not present

## 2023-11-15 DIAGNOSIS — Z7901 Long term (current) use of anticoagulants: Secondary | ICD-10-CM | POA: Diagnosis not present

## 2023-11-15 DIAGNOSIS — I2510 Atherosclerotic heart disease of native coronary artery without angina pectoris: Secondary | ICD-10-CM | POA: Diagnosis present

## 2023-11-15 DIAGNOSIS — Z888 Allergy status to other drugs, medicaments and biological substances status: Secondary | ICD-10-CM

## 2023-11-15 DIAGNOSIS — K5710 Diverticulosis of small intestine without perforation or abscess without bleeding: Secondary | ICD-10-CM | POA: Diagnosis not present

## 2023-11-15 DIAGNOSIS — I10 Essential (primary) hypertension: Secondary | ICD-10-CM | POA: Diagnosis present

## 2023-11-15 DIAGNOSIS — E785 Hyperlipidemia, unspecified: Secondary | ICD-10-CM | POA: Diagnosis not present

## 2023-11-15 DIAGNOSIS — Z803 Family history of malignant neoplasm of breast: Secondary | ICD-10-CM

## 2023-11-15 DIAGNOSIS — N179 Acute kidney failure, unspecified: Secondary | ICD-10-CM | POA: Diagnosis not present

## 2023-11-15 DIAGNOSIS — I5042 Chronic combined systolic (congestive) and diastolic (congestive) heart failure: Secondary | ICD-10-CM | POA: Diagnosis not present

## 2023-11-15 DIAGNOSIS — I110 Hypertensive heart disease with heart failure: Secondary | ICD-10-CM | POA: Diagnosis not present

## 2023-11-15 DIAGNOSIS — I252 Old myocardial infarction: Secondary | ICD-10-CM | POA: Diagnosis not present

## 2023-11-15 DIAGNOSIS — K449 Diaphragmatic hernia without obstruction or gangrene: Secondary | ICD-10-CM | POA: Diagnosis not present

## 2023-11-15 DIAGNOSIS — D509 Iron deficiency anemia, unspecified: Secondary | ICD-10-CM | POA: Diagnosis not present

## 2023-11-15 DIAGNOSIS — K648 Other hemorrhoids: Secondary | ICD-10-CM | POA: Diagnosis not present

## 2023-11-15 DIAGNOSIS — I4811 Longstanding persistent atrial fibrillation: Secondary | ICD-10-CM | POA: Diagnosis not present

## 2023-11-15 DIAGNOSIS — R195 Other fecal abnormalities: Secondary | ICD-10-CM

## 2023-11-15 DIAGNOSIS — D62 Acute posthemorrhagic anemia: Principal | ICD-10-CM | POA: Diagnosis present

## 2023-11-15 DIAGNOSIS — I4891 Unspecified atrial fibrillation: Secondary | ICD-10-CM | POA: Diagnosis present

## 2023-11-15 DIAGNOSIS — K5730 Diverticulosis of large intestine without perforation or abscess without bleeding: Secondary | ICD-10-CM | POA: Diagnosis not present

## 2023-11-15 DIAGNOSIS — Z885 Allergy status to narcotic agent status: Secondary | ICD-10-CM

## 2023-11-15 DIAGNOSIS — D6832 Hemorrhagic disorder due to extrinsic circulating anticoagulants: Secondary | ICD-10-CM | POA: Diagnosis not present

## 2023-11-15 DIAGNOSIS — D649 Anemia, unspecified: Secondary | ICD-10-CM | POA: Diagnosis present

## 2023-11-15 DIAGNOSIS — M1990 Unspecified osteoarthritis, unspecified site: Secondary | ICD-10-CM | POA: Diagnosis present

## 2023-11-15 LAB — COMPREHENSIVE METABOLIC PANEL WITH GFR
ALT: 15 U/L (ref 0–44)
AST: 40 U/L (ref 15–41)
Albumin: 3.5 g/dL (ref 3.5–5.0)
Alkaline Phosphatase: 66 U/L (ref 38–126)
Anion gap: 10 (ref 5–15)
BUN: 17 mg/dL (ref 8–23)
CO2: 26 mmol/L (ref 22–32)
Calcium: 9.4 mg/dL (ref 8.9–10.3)
Chloride: 96 mmol/L — ABNORMAL LOW (ref 98–111)
Creatinine, Ser: 1.14 mg/dL — ABNORMAL HIGH (ref 0.44–1.00)
GFR, Estimated: 50 mL/min — ABNORMAL LOW (ref >60)
Glucose, Bld: 121 mg/dL — ABNORMAL HIGH (ref 70–99)
Potassium: 3.8 mmol/L (ref 3.5–5.1)
Sodium: 132 mmol/L — ABNORMAL LOW (ref 135–145)
Total Bilirubin: 1.2 mg/dL (ref 0.0–1.2)
Total Protein: 6.6 g/dL (ref 6.5–8.1)

## 2023-11-15 LAB — IRON AND TIBC
Iron: 25 ug/dL — ABNORMAL LOW (ref 28–170)
Saturation Ratios: 4 % — ABNORMAL LOW (ref 10.4–31.8)
TIBC: 605 ug/dL — ABNORMAL HIGH (ref 250–450)
UIBC: 580 ug/dL

## 2023-11-15 LAB — CBC
HCT: 25.5 % — ABNORMAL LOW (ref 36.0–46.0)
Hemoglobin: 7.2 g/dL — ABNORMAL LOW (ref 12.0–15.0)
MCH: 21.2 pg — ABNORMAL LOW (ref 26.0–34.0)
MCHC: 28.2 g/dL — ABNORMAL LOW (ref 30.0–36.0)
MCV: 75.2 fL — ABNORMAL LOW (ref 80.0–100.0)
Platelets: 375 10*3/uL (ref 150–400)
RBC: 3.39 MIL/uL — ABNORMAL LOW (ref 3.87–5.11)
RDW: 19.3 % — ABNORMAL HIGH (ref 11.5–15.5)
WBC: 8.4 10*3/uL (ref 4.0–10.5)
nRBC: 0 % (ref 0.0–0.2)

## 2023-11-15 LAB — HEMOGLOBIN AND HEMATOCRIT, BLOOD
HCT: 26 % — ABNORMAL LOW (ref 36.0–46.0)
Hemoglobin: 7.6 g/dL — ABNORMAL LOW (ref 12.0–15.0)

## 2023-11-15 LAB — CBC WITH DIFFERENTIAL/PLATELET
Basophils Absolute: 0.1 10*3/uL (ref 0.0–0.1)
Basophils Relative: 1 % (ref 0.0–3.0)
Eosinophils Absolute: 0.4 10*3/uL (ref 0.0–0.7)
Eosinophils Relative: 5 % (ref 0.0–5.0)
HCT: 23.9 % — CL (ref 36.0–46.0)
Hemoglobin: 7.2 g/dL — CL (ref 12.0–15.0)
Lymphocytes Relative: 20 % (ref 12.0–46.0)
Lymphs Abs: 1.6 10*3/uL (ref 0.7–4.0)
MCHC: 30.3 g/dL (ref 30.0–36.0)
MCV: 69.4 fl — ABNORMAL LOW (ref 78.0–100.0)
Monocytes Absolute: 1.1 10*3/uL — ABNORMAL HIGH (ref 0.1–1.0)
Monocytes Relative: 13.3 % — ABNORMAL HIGH (ref 3.0–12.0)
Neutro Abs: 4.9 10*3/uL (ref 1.4–7.7)
Neutrophils Relative %: 60.7 % (ref 43.0–77.0)
Platelets: 360 10*3/uL (ref 150.0–400.0)
RBC: 3.44 Mil/uL — ABNORMAL LOW (ref 3.87–5.11)
RDW: 19.5 % — ABNORMAL HIGH (ref 11.5–15.5)
WBC: 8.1 10*3/uL (ref 4.0–10.5)

## 2023-11-15 LAB — RETICULOCYTES
Immature Retic Fract: 30.8 % — ABNORMAL HIGH (ref 2.3–15.9)
RBC.: 3.47 MIL/uL — ABNORMAL LOW (ref 3.87–5.11)
Retic Count, Absolute: 99.6 10*3/uL (ref 19.0–186.0)
Retic Ct Pct: 2.9 % (ref 0.4–3.1)

## 2023-11-15 LAB — FERRITIN: Ferritin: 6 ng/mL — ABNORMAL LOW (ref 11–307)

## 2023-11-15 LAB — ABO/RH: ABO/RH(D): O POS

## 2023-11-15 LAB — POC OCCULT BLOOD, ED: Fecal Occult Bld: POSITIVE — AB

## 2023-11-15 LAB — BRAIN NATRIURETIC PEPTIDE: B Natriuretic Peptide: 246.8 pg/mL — ABNORMAL HIGH (ref 0.0–100.0)

## 2023-11-15 LAB — TROPONIN I (HIGH SENSITIVITY)
Troponin I (High Sensitivity): 17 ng/L (ref <18)
Troponin I (High Sensitivity): 17 ng/L (ref <18)

## 2023-11-15 LAB — VITAMIN B12: Vitamin B-12: 298 pg/mL (ref 180–914)

## 2023-11-15 LAB — PROTIME-INR
INR: 1.3 — ABNORMAL HIGH (ref 0.8–1.2)
Prothrombin Time: 16.9 s — ABNORMAL HIGH (ref 11.4–15.2)

## 2023-11-15 LAB — HEMOGLOBIN A1C
Hgb A1c MFr Bld: 5.4 % (ref 4.8–5.6)
Mean Plasma Glucose: 108.28 mg/dL

## 2023-11-15 LAB — FOLATE: Folate: >40 ng/mL (ref >5.9)

## 2023-11-15 MED ORDER — LACTATED RINGERS IV SOLN: INTRAVENOUS | Status: AC

## 2023-11-15 MED ORDER — MELATONIN 5 MG PO TABS: 5.0000 mg | ORAL_TABLET | Freq: Every evening | ORAL | Status: DC | PRN

## 2023-11-15 MED ORDER — PANTOPRAZOLE SODIUM 40 MG IV SOLR
40.0000 mg | Freq: Two times a day (BID) | INTRAVENOUS | Status: DC
  Administered 2023-11-15 – 2023-11-17 (×4): 40 mg via INTRAVENOUS
  Filled 2023-11-15 (×4): qty 10

## 2023-11-15 MED ORDER — ONDANSETRON HCL 4 MG/2ML IJ SOLN
4.0000 mg | Freq: Four times a day (QID) | INTRAMUSCULAR | Status: DC | PRN
Start: 2023-11-15 — End: 2023-11-17

## 2023-11-15 MED ORDER — ONDANSETRON HCL 4 MG PO TABS: 4.0000 mg | ORAL_TABLET | Freq: Four times a day (QID) | ORAL | Status: DC | PRN

## 2023-11-15 MED ORDER — ACETAMINOPHEN 325 MG PO TABS
650.0000 mg | ORAL_TABLET | Freq: Four times a day (QID) | ORAL | Status: DC | PRN
  Administered 2023-11-16: 650 mg via ORAL
  Filled 2023-11-15: qty 2

## 2023-11-15 NOTE — ED Notes (Signed)
 Patient transported to X-ray

## 2023-11-15 NOTE — Telephone Encounter (Signed)
 Patient's son is the one who actually called.  Spoke with him today.

## 2023-11-15 NOTE — ED Provider Triage Note (Signed)
 Emergency Medicine Provider Triage Evaluation Note  Sarah Hendricks , a 78 y.o. female  was evaluated in triage.  Pt referred here by her PCP.  Reports that she has acute anemia, hemoglobin of 7.2.  Reports darker stools in the past week.  Was told she has fluid on her lungs.  Notes remote history of breast cancer.  No chest pain.  Review of Systems  Positive:  Negative:   Physical Exam  BP (!) 123/47   Pulse 64   Temp 97.8 F (36.6 C)   Resp 14   Wt 68 kg   SpO2 91%   BMI 26.56 kg/m  Gen:   Awake, no distress   Resp:  Normal effort  MSK:   Moves extremities without difficulty, no lower extremity edema Other:    Medical Decision Making  Medically screening exam initiated at 12:45 PM.  Appropriate orders placed.  Sarah Hendricks was informed that the remainder of the evaluation will be completed by another provider, this initial triage assessment does not replace that evaluation, and the importance of remaining in the ED until their evaluation is complete.     [name] DEL, PA-C 11/15/23 1246

## 2023-11-15 NOTE — Telephone Encounter (Signed)
 Spoke with patient and recommendations given.   Questions answered and she has agreed to go to ED.

## 2023-11-15 NOTE — H&P (Signed)
 History and Physical    Patient: Sarah Hendricks FMW:[id number] DOB: [birth year] DOA: 11/15/2023 DOS: the patient was seen and examined on 11/15/2023 . PCP: Geofm Glade PARAS, MD  Patient coming from: Home Chief complaint: Chief Complaint  Patient presents with   anemia   HPI:  Sarah Hendricks is a 78 y.o. female with past medical history  of  hx of paroxysmal atrial fibrillation,  osteoarthritis, CAD/MI s/p PCI, HTN, HLD breast cancer s/p lumpectomy and radiation, GERD and vitamin D  deficiency who presented with anemia and feeling weak and tired of the past few weeks.  Chart review shows patient had cardioversion earlier in June and has been doing well since then. ED Course : Vital signs in the ED were notable for the following:  Vitals:   11/15/23 1200 11/15/23 1225 11/15/23 1612 11/15/23 1631  BP: (!) 123/47     Pulse: 64   74  Temp: 97.8 F (36.6 C)  98.4 F (36.9 C)   Resp: 14     Weight:  68 kg    SpO2: 91%   99%  TempSrc:   Oral   BMI (Calculated):  26.56    >>ED evaluation thus far shows: CMP shows sodium 132 glucose 121 normal bicarb and anion gap, AKI with a creatinine of 1.14 EGFR 50 normal LFTs. Abnormal CBC showing microcytosis with anemia with hemoglobin of 7.2 MCV of 75.2 elevated RDW at 19.3 and white count of 8.4 platelets 375.  EKG shows sinus rhythm at 62, PR of 158, QRS of 80, QTc 434, changes positive for LVH, anterior and inferior infarct consistent with the patient's history of CAD.  >>While in the ED patient received the following: Medications - No data to display                                                                                 Review of Systems  Constitutional:  Positive for malaise/fatigue.  Respiratory:  Positive for shortness of breath.   Neurological:  Positive for weakness.   Past Medical History:  Diagnosis Date   Allergy    Anxiety    Arthritis    Bimalleolar ankle fracture, right, closed, initial encounter    Breast cancer  (HCC) 1994   lumpectomy and XRT, no chemo   CAD (coronary artery disease) 2008   PTCA, in [place]   GERD (gastroesophageal reflux disease)    History of chicken pox    Hyperlipidemia    Hypertension    Myocardial infarction (HCC) 03/15/2007   stent placed   Vitamin D  deficiency    Past Surgical History:  Procedure Laterality Date   BREAST BIOPSY Left 1994   BREAST LUMPECTOMY Left 1994   CARDIOVERSION N/A 07/15/2023   Procedure: CARDIOVERSION;  Surgeon: Loni Soyla LABOR, MD;  Location: [facility];  Service: Cardiovascular;  Laterality: N/A;   CATARACT EXTRACTION, BILATERAL     CORONARY ANGIOPLASTY WITH STENT PLACEMENT  2008   CRANIECTOMY FOR DEPRESSED SKULL FRACTURE  1986   ORIF ANKLE FRACTURE Right 06/20/2023   Procedure: RIGHT OPEN REDUCTION INTERNAL FIXATION (ORIF) ANKLE FRACTURE;  Surgeon: Genelle Standing, MD;  Location: [facility];  Service: Orthopedics;  Laterality:  Right;   TONSILLECTOMY  1966    reports that she has never smoked. She has never been exposed to tobacco smoke. She has never used smokeless tobacco. She reports that she does not drink alcohol and does not use drugs. Allergies  Allergen Reactions   Acetaminophen -Codeine Other (See Comments)    Bad dreams   Tigan [Trimethobenzamide] Other (See Comments)    Bad dreams   Alendronate     Caused leg pains    Atorvastatin  Other (See Comments)    Pt reports causes bilateral leg pains.   Codeine     Bad dreams   Simvastatin Other (See Comments)    Leg cramps   Family History  Problem Relation Age of Onset   Breast cancer Mother    Hypertension Mother    Heart disease Mother    Stroke Mother    Arthritis Father    Heart disease Father        died at 58 of heart attack   Heart disease Brother    Stroke Maternal Grandmother    Heart disease Maternal Grandfather    Colon cancer Neg Hx    Esophageal cancer Neg Hx    Rectal cancer Neg Hx    Stomach cancer Neg Hx    Prior to Admission  medications   Medication Sig Start Date End Date Taking? Authorizing Provider  apixaban  (ELIQUIS ) 5 MG TABS tablet Take 1 tablet (5 mg total) by mouth 2 (two) times daily. 09/30/23   Vannie Reche RAMAN, NP  Cholecalciferol  (EQL VITAMIN D3) 1000 units tablet Take 1 tablet (1,000 Units total) by mouth daily. 06/04/15   Geofm Glade PARAS, MD  escitalopram  (LEXAPRO ) 10 MG tablet TAKE 1 TABLET(10 MG) BY MOUTH DAILY 03/25/23   Geofm Glade PARAS, MD  furosemide  (LASIX ) 20 MG tablet Take one tablet 4 times per week 10/24/23   Lonni Slain, MD  losartan  (COZAAR ) 100 MG tablet TAKE 1 TABLET(100 MG) BY MOUTH DAILY 11/26/22   Hobart Powell BRAVO, MD  propranolol  (INDERAL ) 80 MG tablet Take 1 tablet (80 mg total) by mouth 2 (two) times daily. 11/26/22   Hobart Powell BRAVO, MD  rosuvastatin  (CRESTOR ) 10 MG tablet Take 1 tablet (10 mg total) by mouth daily. 11/26/22   Hobart Powell BRAVO, MD  traMADol  (ULTRAM ) 50 MG tablet Take 1 tablet (50 mg total) by mouth every 6 (six) hours as needed. 07/18/23   Genelle Standing, MD                                                                                 Vitals:   11/15/23 1200 11/15/23 1225 11/15/23 1612 11/15/23 1631  BP: (!) 123/47     Pulse: 64   74  Resp: 14     Temp: 97.8 F (36.6 C)  98.4 F (36.9 C)   TempSrc:   Oral   SpO2: 91%   99%  Weight:  68 kg     Physical Exam Vitals reviewed.  Constitutional:      General: She is not in acute distress.    Appearance: She is not ill-appearing.  HENT:     Head: Normocephalic.   Eyes:  Extraocular Movements: Extraocular movements intact.    Cardiovascular:     Rate and Rhythm: Normal rate and regular rhythm.     Heart sounds: Normal heart sounds.  Pulmonary:     Breath sounds: Normal breath sounds.  Abdominal:     General: There is no distension.     Palpations: Abdomen is soft.     Tenderness: There is no abdominal tenderness.   Neurological:     General: No focal deficit present.     Mental  Status: She is alert and oriented to person, place, and time.     Labs on Admission: I have personally reviewed following labs and imaging studies CBC: Recent Labs  Lab 11/14/23 1655 11/15/23 1223  WBC 8.1 8.4  NEUTROABS 4.9  --   HGB 7.2 Repeated and verified X2.* 7.2*  HCT 23.9 Repeated and verified X2.* 25.5*  MCV 69.4* 75.2*  PLT 360.0 375   Basic Metabolic Panel: Recent Labs  Lab 11/14/23 1655 11/15/23 1223  NA 133* 132*  K 3.9 3.8  CL 96 96*  CO2 29 26  GLUCOSE 95 121*  BUN 16 17  CREATININE 0.93 1.14*  CALCIUM  9.6 9.4   GFR: Estimated Creatinine Clearance: 38.8 mL/min (A) (by C-G formula based on SCr of 1.14 mg/dL (H)). Liver Function Tests: Recent Labs  Lab 11/14/23 1655 11/15/23 1223  AST 20 40  ALT 13 15  ALKPHOS 74 66  BILITOT 0.6 1.2  PROT 6.9 6.6  ALBUMIN 4.1 3.5   No results for input(s): LIPASE, AMYLASE in the last 168 hours. No results for input(s): AMMONIA in the last 168 hours. Coagulation Profile: Recent Labs  Lab 11/15/23 1223  INR 1.3*   Cardiac Enzymes: No results for input(s): CKTOTAL, CKMB, CKMBINDEX, TROPONINI in the last 168 hours. BNP (last 3 results) Recent Labs    11/14/23 1655  PROBNP 429.0*   HbA1C: No results for input(s): HGBA1C in the last 72 hours. CBG: No results for input(s): GLUCAP in the last 168 hours. Lipid Profile: No results for input(s): CHOL, HDL, LDLCALC, TRIG, CHOLHDL, LDLDIRECT in the last 72 hours. Thyroid  Function Tests: No results for input(s): TSH, T4TOTAL, FREET4, T3FREE, THYROIDAB in the last 72 hours. Anemia Panel: No results for input(s): VITAMINB12, FOLATE, FERRITIN, TIBC, IRON, RETICCTPCT in the last 72 hours. Urine analysis:    Component Value Date/Time   COLORURINE YELLOW 05/29/2023 1940   APPEARANCEUR CLEAR 05/29/2023 1940   LABSPEC 1.009 05/29/2023 1940   PHURINE 7.0 05/29/2023 1940   GLUCOSEU NEGATIVE 05/29/2023 1940    GLUCOSEU NEGATIVE 08/29/2017 1601   HGBUR NEGATIVE 05/29/2023 1940   BILIRUBINUR NEGATIVE 05/29/2023 1940   KETONESUR TRACE (A) 05/29/2023 1940   PROTEINUR NEGATIVE 05/29/2023 1940   UROBILINOGEN 0.2 08/29/2017 1601   NITRITE NEGATIVE 05/29/2023 1940   LEUKOCYTESUR NEGATIVE 05/29/2023 1940   Radiological Exams on Admission: DG Chest 2 View Result Date: 11/15/2023 CLINICAL DATA:  SOB EXAM: CHEST - 2 VIEW COMPARISON:  November 14, 2023 FINDINGS: Biapical pleural thickening. Similar patchy opacities in the left lung base peripherally, corresponding to prior region of subpleural scarring. No new airspace consolidation, pleural effusion, or pneumothorax. Calcified mediastinal lymph nodes. No cardiomegaly. Tortuous aorta with aortic atherosclerosis. No acute fracture or destructive lesions. Multilevel thoracic osteophytosis. Surgical clips in the left breast. IMPRESSION: No acute cardiopulmonary abnormality. Electronically Signed   By: Rogelia Myers M.D.   On: 11/15/2023 13:18   DG Chest 2 View Result Date: 11/14/2023 CLINICAL DATA:  Dyspnea on exertion  EXAM: CHEST - 2 VIEW COMPARISON:  None Available. FINDINGS: Normal mediastinum. Calcified mediastinal lymph nodes noted. Chronic bronchitic markings in lungs unchanged. Trace pleural effusions. No focal consolidation. No pneumothorax. Biopsy clips in the LEFT breast. IMPRESSION: 1. Chronic bronchitic markings. 2. Trace pleural effusions. Electronically Signed   By: Jackquline Boxer M.D.   On: 11/14/2023 17:30   Data Reviewed: Relevant notes from primary care and specialist visits, past discharge summaries as available in EHR, including Care Everywhere . Prior diagnostic testing as pertinent to current admission diagnoses, Updated medications and problem lists for reconciliation .ED course, including vitals, labs, imaging, treatment and response to treatment,Triage notes, nursing and pharmacy notes and ED provider's notes.Notable results as noted in  HPI.Discussed case with EDMD/ ED APP/ or Specialty MD on call and as needed.  Assessment & Plan  >> ABLA/ GIB: Pt had colonoscopy in 2019 showing - Non- bleeding internal hemorrhoids. - Diverticulosis in the sigmoid colon and in the descending colon. - The examination was otherwise normal on direct and retroflexion views. - No specimens collected.  Today is guaiac positive. And we will start IV PPI. And type/ screen with h/h q6 and gi consult.  CBC     Latest Ref Rng & Units 11/15/2023   12:23 PM 11/14/2023    4:55 PM 07/29/2023    9:33 AM  CBC  WBC 4.0 - 10.5 K/uL 8.4  8.1  8.0   Hemoglobin 12.0 - 15.0 g/dL 7.2  7.2 Repeated and verified X2.  10.4   Hematocrit 36.0 - 46.0 % 25.5  23.9 Repeated and verified X2.  33.4   Platelets 150 - 400 K/uL 375  360.0  379      >>PAF: Currently in sinus and pt is on eliquis .  We will hold eliqius and pt to d/w her cards about plan for [facility] in light of her anemia and now GIB. Pt verbalized understanding.   >>CAD with prior PCI 2008 /Hyperlipidemia: Continue patient on statin therapy.    >>C/H combined systolic and diastolic heart failure: As needed diuretics as needed currently will hold Lasix .    >>Essential HTN: Vitals:   11/15/23 1200  BP: (!) 123/47  Currently we will hold home regimen of Lasix  losartan  and propranolol . As needed hydralazine.    >>AKI: Lab Results  Component Value Date   CREATININE 1.14 (H) 11/15/2023   CREATININE 0.93 11/14/2023   CREATININE 1.00 10/17/2023  Currently we will hold patient's losartan .  Monitor kidney function.    >>Elevated glucose: Will add A1c.     DVT prophylaxis:  SCDs Consults:  GI   Advance Care Planning:    Code Status: Full Code   Family Communication:  Daughter and family and husband. Disposition Plan:  Home Severity of Illness: The appropriate patient status for this patient is INPATIENT. Inpatient status is judged to be reasonable and necessary in order to provide  the required intensity of service to ensure the patient's safety. The patient's presenting symptoms, physical exam findings, and initial radiographic and laboratory data in the context of their chronic comorbidities is felt to place them at high risk for further clinical deterioration. Furthermore, it is not anticipated that the patient will be medically stable for discharge from the hospital within 2 midnights of admission.   * I certify that at the point of admission it is my clinical judgment that the patient will require inpatient hospital care spanning beyond 2 midnights from the point of admission due to high intensity of service,  high risk for further deterioration and high frequency of surveillance required.*  Unresulted Labs (From admission, onward)     Start     Ordered   11/15/23 1647  Hemoglobin A1c  Add-on,   R        11/15/23 1646   11/15/23 1530  Vitamin B12  (Anemia Panel (PNL))  Once,   R        11/15/23 1529   11/15/23 1530  Folate  (Anemia Panel (PNL))  Once,   R        11/15/23 1529   11/15/23 1530  Iron and TIBC  (Anemia Panel (PNL))  Once,   R        11/15/23 1529   11/15/23 1530  Ferritin  (Anemia Panel (PNL))  Once,   R        11/15/23 1529   11/15/23 1530  Reticulocytes  (Anemia Panel (PNL))  Once,   R        11/15/23 1529   11/15/23 1530  Occult blood card to lab, stool RN will collect  ONCE - STAT,   STAT       Question:  Specimen to be collected by:  Answer:  RN will collect   11/15/23 1529   11/15/23 1245  Brain natriuretic peptide  Once,   URGENT        11/15/23 1245            Meds ordered this encounter  Medications   lactated ringers  infusion   OR Linked Order Group    ondansetron  (ZOFRAN ) tablet 4 mg    ondansetron  (ZOFRAN ) injection 4 mg   pantoprazole (PROTONIX) injection 40 mg     Orders Placed This Encounter  Procedures   ED Echo US  Bedside   DG Chest 2 View   Comprehensive metabolic panel   CBC   Protime-INR - (order if Patient is  taking Coumadin / Warfarin)   Brain natriuretic peptide   Vitamin B12   Folate   Iron and TIBC   Ferritin   Reticulocytes   Occult blood card to lab, stool RN will collect   Hemoglobin A1c   Diet clear liquid Room service appropriate? Yes; Fluid consistency: Thin   Initiate Carrier Fluid Protocol   SCDs   Cardiac Monitoring Continuous x 24 hours Indications for use: Other; other indications for use: Monitor for ischemia   Vital signs   Notify physician (specify)   Mobility Protocol: No Restrictions RN to initiate protocols based on patient's level of care   Refer to Sidebar Report Refer to ICU, Med-Surg, Progressive, and Step-Down Mobility Protocol Sidebars   Initiate Adult Central Line Maintenance and Catheter Protocol for patients with central line (CVC, PICC, Port, Hemodialysis, Trialysis)   If patient diabetic or glucose greater than 140 notify physician for Sliding Scale Insulin Orders   Intake and Output   Do not place and if present remove PureWick   Initiate Oral Care Protocol   Initiate Carrier Fluid Protocol   RN may order General Admission PRN Orders utilizing General Admission PRN medications (through manage orders) for the following patient needs: allergy symptoms (Claritin), cold sores (Carmex), cough (Robitussin DM), eye irritation (Liquifilm Tears), hemorrhoids (Tucks), indigestion (Maalox), minor skin irritation (Hydrocortisone Cream), muscle pain Lucienne Gay), nose irritation (saline nasal spray) and sore throat (Chloraseptic spray).   Strict intake and output   Daily weights   Full code   Consult for [facility] Admission   Consult to gastroenterology Consult Timeframe:  ROUTINE - requires response within 24 hours; Reason for Consult? ABLA/ GIB/ELIQUIS .   Pulse oximetry check with vital signs   Oxygen therapy Mode or (Route): Nasal cannula; Liters Per Minute: 2; Keep O2 saturation between: greater than 92 %   POC occult blood, ED   ED EKG   Type and screen  [facility]   ABO/Rh   Admit to Inpatient (patient's expected length of stay will be greater than 2 midnights or inpatient only procedure)   Aspiration precautions   Fall precautions    Author: Mario LULLA Blanch, MD 12 pm- 8 pm. [facility]. 11/15/2023 4:46 PM Please note for any communication after hours contact [facility] Assigned provider on call on Amion.

## 2023-11-15 NOTE — ED Triage Notes (Signed)
 PT states Dr. Geofm sent her to ED for anemia and SOB that started over the weekend.  Denies pain.  7.2 Hemoglobin 23.9 Hematocrit

## 2023-11-15 NOTE — ED Triage Notes (Signed)
 Pt states she has noticed dark stools lately denies any abd pain.

## 2023-11-15 NOTE — ED Provider Notes (Signed)
 [place] EMERGENCY DEPARTMENT AT [facility] Provider Note  CSN: [id number] Arrival date & time: 11/15/23 1156  Chief Complaint(s) anemia  HPI Sarah Hendricks is a 78 y.o. female who is here today after she went to her PCP for general malaise, feeling short of breath.  She had blood work done that showed no anemia.  Patient does endorse having some darker stools recently.  She does not use any NSAIDs or steroids.   Past Medical History Past Medical History:  Diagnosis Date   Allergy    Anxiety    Arthritis    Bimalleolar ankle fracture, right, closed, initial encounter    Breast cancer (HCC) 1994   lumpectomy and XRT, no chemo   CAD (coronary artery disease) 2008   [place], in [place]   GERD (gastroesophageal reflux disease)    History of chicken pox    Hyperlipidemia    Hypertension    Myocardial infarction (HCC) 03/15/2007   stent placed   Vitamin D  deficiency    Patient Active Problem List   Diagnosis Date Noted   DOE (dyspnea on exertion) 11/14/2023   Bimalleolar ankle fracture, right, closed, initial encounter 06/20/2023   Atrial fibrillation (HCC) 05/30/2023   Closed fracture of right ankle 05/30/2023   Fall, initial encounter 05/29/2023   Localized swelling of left lower leg 03/02/2023   Left hip pain 05/25/2022   Right knee pain 05/25/2022   Left leg pain 05/25/2022   Hyponatremia 02/15/2022   Stress and adjustment reaction 01/04/2020   Prediabetes 05/26/2017   Osteoporosis 05/30/2014   CAD (coronary artery disease)    Hypertension    Hyperlipidemia    History of breast cancer    Vitamin D  deficiency    Home Medication(s) Prior to Admission medications   Medication Sig Start Date End Date Taking? Authorizing Provider  apixaban  (ELIQUIS ) 5 MG TABS tablet Take 1 tablet (5 mg total) by mouth 2 (two) times daily. 09/30/23   Vannie Reche RAMAN, NP  Cholecalciferol  (EQL VITAMIN D3) 1000 units tablet Take 1 tablet (1,000 Units total) by mouth daily. 06/04/15    Geofm Glade PARAS, MD  escitalopram  (LEXAPRO ) 10 MG tablet TAKE 1 TABLET(10 MG) BY MOUTH DAILY 03/25/23   Geofm Glade PARAS, MD  furosemide  (LASIX ) 20 MG tablet Take one tablet 4 times per week 10/24/23   Lonni Slain, MD  losartan  (COZAAR ) 100 MG tablet TAKE 1 TABLET(100 MG) BY MOUTH DAILY 11/26/22   Hobart Powell BRAVO, MD  propranolol  (INDERAL ) 80 MG tablet Take 1 tablet (80 mg total) by mouth 2 (two) times daily. 11/26/22   Hobart Powell BRAVO, MD  rosuvastatin  (CRESTOR ) 10 MG tablet Take 1 tablet (10 mg total) by mouth daily. 11/26/22   Hobart Powell BRAVO, MD  traMADol  (ULTRAM ) 50 MG tablet Take 1 tablet (50 mg total) by mouth every 6 (six) hours as needed. 07/18/23   Genelle Standing, MD  Past Surgical History Past Surgical History:  Procedure Laterality Date   BREAST BIOPSY Left 1994   BREAST LUMPECTOMY Left 1994   CARDIOVERSION N/A 07/15/2023   Procedure: CARDIOVERSION;  Surgeon: Loni Soyla LABOR, MD;  Location: [facility];  Service: Cardiovascular;  Laterality: N/A;   CATARACT EXTRACTION, BILATERAL     CORONARY ANGIOPLASTY WITH STENT PLACEMENT  2008   CRANIECTOMY FOR DEPRESSED SKULL FRACTURE  1986   ORIF ANKLE FRACTURE Right 06/20/2023   Procedure: RIGHT OPEN REDUCTION INTERNAL FIXATION (ORIF) ANKLE FRACTURE;  Surgeon: Genelle Standing, MD;  Location: [facility];  Service: Orthopedics;  Laterality: Right;   TONSILLECTOMY  1966   Family History Family History  Problem Relation Age of Onset   Breast cancer Mother    Hypertension Mother    Heart disease Mother    Stroke Mother    Arthritis Father    Heart disease Father        died at 33 of heart attack   Heart disease Brother    Stroke Maternal Grandmother    Heart disease Maternal Grandfather    Colon cancer Neg Hx    Esophageal cancer Neg Hx    Rectal cancer Neg Hx     Stomach cancer Neg Hx     Social History Social History   Tobacco Use   Smoking status: Never    Passive exposure: Never   Smokeless tobacco: Never  Vaping Use   Vaping status: Never Used  Substance Use Topics   Alcohol use: No   Drug use: No   Allergies Acetaminophen -codeine, Tigan [trimethobenzamide], Alendronate, Atorvastatin , Codeine, and Simvastatin  Review of Systems Review of Systems  Physical Exam Vital Signs  I have reviewed the triage vital signs BP (!) 123/47   Pulse 64   Temp 97.8 F (36.6 C)   Resp 14   Wt 68 kg   SpO2 91%   BMI 26.56 kg/m   Physical Exam Vitals and nursing note reviewed.  HENT:     Head: Normocephalic.   Cardiovascular:     Rate and Rhythm: Normal rate.  Abdominal:     General: Abdomen is flat. There is no distension.     Palpations: Abdomen is soft.     Tenderness: There is no abdominal tenderness.   Musculoskeletal:     Cervical back: Normal range of motion.   Neurological:     Mental Status: She is alert.     ED Results and Treatments Labs (all labs ordered are listed, but only abnormal results are displayed) Labs Reviewed  COMPREHENSIVE METABOLIC PANEL WITH GFR - Abnormal; Notable for the following components:      Result Value   Sodium 132 (*)    Chloride 96 (*)    Glucose, Bld 121 (*)    Creatinine, Ser 1.14 (*)    GFR, Estimated 50 (*)    All other components within normal limits  CBC - Abnormal; Notable for the following components:   RBC 3.39 (*)    Hemoglobin 7.2 (*)    HCT 25.5 (*)    MCV 75.2 (*)    MCH 21.2 (*)    MCHC 28.2 (*)    RDW 19.3 (*)    All other components within normal limits  PROTIME-INR - Abnormal; Notable for the following components:   Prothrombin Time 16.9 (*)    INR 1.3 (*)    All other components within normal limits  BRAIN NATRIURETIC PEPTIDE  POC OCCULT BLOOD, ED  TYPE AND SCREEN  ABO/RH  TROPONIN I (HIGH SENSITIVITY)  TROPONIN I (HIGH SENSITIVITY)                                                                                                                           Radiology DG Chest 2 View Result Date: 11/15/2023 CLINICAL DATA:  SOB EXAM: CHEST - 2 VIEW COMPARISON:  November 14, 2023 FINDINGS: Biapical pleural thickening. Similar patchy opacities in the left lung base peripherally, corresponding to prior region of subpleural scarring. No new airspace consolidation, pleural effusion, or pneumothorax. Calcified mediastinal lymph nodes. No cardiomegaly. Tortuous aorta with aortic atherosclerosis. No acute fracture or destructive lesions. Multilevel thoracic osteophytosis. Surgical clips in the left breast. IMPRESSION: No acute cardiopulmonary abnormality. Electronically Signed   By: Rogelia Myers M.D.   On: 11/15/2023 13:18   DG Chest 2 View Result Date: 11/14/2023 CLINICAL DATA:  Dyspnea on exertion EXAM: CHEST - 2 VIEW COMPARISON:  None Available. FINDINGS: Normal mediastinum. Calcified mediastinal lymph nodes noted. Chronic bronchitic markings in lungs unchanged. Trace pleural effusions. No focal consolidation. No pneumothorax. Biopsy clips in the LEFT breast. IMPRESSION: 1. Chronic bronchitic markings. 2. Trace pleural effusions. Electronically Signed   By: Jackquline Boxer M.D.   On: 11/14/2023 17:30    Pertinent labs & imaging results that were available during my care of the patient were reviewed by me and considered in my medical decision making (see MDM for details).  Medications Ordered in ED Medications - No data to display                                                                                                                                   Procedures Ultrasound ED Echo  Date/Time: 11/15/2023 3:10 PM  Performed by: Mannie Fairy DASEN, DO Authorized by: Mannie Fairy DASEN, DO   Procedure details:    Indications: dyspnea     Views: parasternal long axis view     Images: archived   Findings:    Pericardium: no pericardial effusion     LV  Function: depressed (30 - 50%)     RV Diameter: normal   Impression:    Impression: decreased contractility     (including critical care time)  Medical Decision Making / ED Course   This patient presents to the ED for concern of malaise, anemia, this involves an extensive number of treatment options, and  is a complaint that carries with it a high risk of complications and morbidity.  The differential diagnosis includes anemia, new onset anemia, GI bleed.  MDM: 78 year old female here today with new onset anemia.  Did perform bedside ultrasound on the patient showed mildly reduced EF.  No pericardial effusion, no pleural effusions.  Patient with hemoglobin of 7.2.  Is unchanged from yesterday, however is down from 10.4 in March of this past year.  Given the patient's precipitous drop in her hemoglobin, she does require admission for further workup.  Patient declined digital rectal exam.     Additional history obtained: -Additional history obtained from family at bedside -External records from outside source obtained and reviewed including: Chart review including previous notes, labs, imaging, consultation notes   Lab Tests: -I ordered, reviewed, and interpreted labs.   The pertinent results include:   Labs Reviewed  COMPREHENSIVE METABOLIC PANEL WITH GFR - Abnormal; Notable for the following components:      Result Value   Sodium 132 (*)    Chloride 96 (*)    Glucose, Bld 121 (*)    Creatinine, Ser 1.14 (*)    GFR, Estimated 50 (*)    All other components within normal limits  CBC - Abnormal; Notable for the following components:   RBC 3.39 (*)    Hemoglobin 7.2 (*)    HCT 25.5 (*)    MCV 75.2 (*)    MCH 21.2 (*)    MCHC 28.2 (*)    RDW 19.3 (*)    All other components within normal limits  PROTIME-INR - Abnormal; Notable for the following components:   Prothrombin Time 16.9 (*)    INR 1.3 (*)    All other components within normal limits  BRAIN NATRIURETIC PEPTIDE   POC OCCULT BLOOD, ED  TYPE AND SCREEN  ABO/RH  TROPONIN I (HIGH SENSITIVITY)  TROPONIN I (HIGH SENSITIVITY)      EKG my independent review of the patient's EKG shows no ST segment depressions or elevations, no T wave inversions, no evidence of acute ischemia.  EKG Interpretation Date/Time:    Ventricular Rate:    PR Interval:    QRS Duration:    QT Interval:    QTC Calculation:   R Axis:      Text Interpretation:           Imaging Studies ordered: I ordered imaging studies including chest x-ray I independently visualized and interpreted imaging. I agree with the radiologist interpretation   Medicines ordered and prescription drug management: No orders of the defined types were placed in this encounter.   -I have reviewed the patients home medicines and have made adjustments as needed   Cardiac Monitoring: The patient was maintained on a cardiac monitor.  I personally viewed and interpreted the cardiac monitored which showed an underlying rhythm of: Normal sinus rhythm  Social Determinants of Health:  Factors impacting patients care include:    Reevaluation: After the interventions noted above, I reevaluated the patient and found that they have :improved  Co morbidities that complicate the patient evaluation  Past Medical History:  Diagnosis Date   Allergy    Anxiety    Arthritis    Bimalleolar ankle fracture, right, closed, initial encounter    Breast cancer (HCC) 1994   lumpectomy and XRT, no chemo   CAD (coronary artery disease) 2008   [place], in [place]   GERD (gastroesophageal reflux disease)    History of chicken pox    Hyperlipidemia  Hypertension    Myocardial infarction (HCC) 03/15/2007   stent placed   Vitamin D  deficiency        Final Clinical Impression(s) / ED Diagnoses Final diagnoses:  Microcytic anemia     @PCDICTATION @    Mannie Pac T, DO 11/15/23 1512

## 2023-11-15 NOTE — Telephone Encounter (Signed)
 Copied from CRM [id number]. Topic: General - Other >> Nov 15, 2023  1:43 PM [place] P wrote: Reason for CRM: Pt Spouse calling to speak to carla CMA, contacted CAL- CAL advise carla is with pt, pt spouse request to be reached [phone]

## 2023-11-15 NOTE — ED Notes (Signed)
 Pt needs 2nd pink top

## 2023-11-15 NOTE — Telephone Encounter (Signed)
 Call her - has acute anemia which is concerning for acute bleed.  She does have element of heart failure as well.      Needs to go to the ED

## 2023-11-16 ENCOUNTER — Encounter (HOSPITAL_COMMUNITY): Payer: Self-pay | Admitting: Internal Medicine

## 2023-11-16 ENCOUNTER — Inpatient Hospital Stay (HOSPITAL_COMMUNITY): Admitting: Certified Registered"

## 2023-11-16 ENCOUNTER — Encounter (HOSPITAL_COMMUNITY)
Admission: EM | Disposition: A | Discharge status: Home / Self Care | Payer: Self-pay | Source: Home / Self Care | Attending: Internal Medicine

## 2023-11-16 DIAGNOSIS — I10 Essential (primary) hypertension: Secondary | ICD-10-CM

## 2023-11-16 DIAGNOSIS — D509 Iron deficiency anemia, unspecified: Secondary | ICD-10-CM

## 2023-11-16 DIAGNOSIS — K5710 Diverticulosis of small intestine without perforation or abscess without bleeding: Secondary | ICD-10-CM

## 2023-11-16 DIAGNOSIS — K449 Diaphragmatic hernia without obstruction or gangrene: Secondary | ICD-10-CM

## 2023-11-16 DIAGNOSIS — I2510 Atherosclerotic heart disease of native coronary artery without angina pectoris: Secondary | ICD-10-CM

## 2023-11-16 DIAGNOSIS — D500 Iron deficiency anemia secondary to blood loss (chronic): Secondary | ICD-10-CM | POA: Diagnosis not present

## 2023-11-16 DIAGNOSIS — R195 Other fecal abnormalities: Secondary | ICD-10-CM

## 2023-11-16 DIAGNOSIS — Z7901 Long term (current) use of anticoagulants: Secondary | ICD-10-CM

## 2023-11-16 HISTORY — PX: ENTEROSCOPY: SHX5533

## 2023-11-16 LAB — POCT I-STAT, CHEM 8
BUN: 12 mg/dL (ref 8–23)
Calcium, Ion: 1.2 mmol/L (ref 1.15–1.40)
Chloride: 96 mmol/L — ABNORMAL LOW (ref 98–111)
Creatinine, Ser: 0.9 mg/dL (ref 0.44–1.00)
Glucose, Bld: 98 mg/dL (ref 70–99)
HCT: 24 % — ABNORMAL LOW (ref 36.0–46.0)
Hemoglobin: 8.2 g/dL — ABNORMAL LOW (ref 12.0–15.0)
Potassium: 3.6 mmol/L (ref 3.5–5.1)
Sodium: 133 mmol/L — ABNORMAL LOW (ref 135–145)
TCO2: 24 mmol/L (ref 22–32)

## 2023-11-16 SURGERY — ENTEROSCOPY
Anesthesia: Monitor Anesthesia Care

## 2023-11-16 MED ORDER — PROPOFOL 500 MG/50ML IV EMUL
INTRAVENOUS | Status: DC | PRN
  Administered 2023-11-16: 100 ug/kg/min via INTRAVENOUS

## 2023-11-16 MED ORDER — STERILE WATER FOR IRRIGATION IR SOLN
Status: DC | PRN
  Administered 2023-11-16: 50 mL

## 2023-11-16 MED ORDER — NA SULFATE-K SULFATE-MG SULF 17.5-3.13-1.6 GM/177ML PO SOLN
0.5000 | Freq: Once | ORAL | Status: AC
  Administered 2023-11-17: 177 mL via ORAL
  Filled 2023-11-16: qty 1

## 2023-11-16 MED ORDER — PROPOFOL 10 MG/ML IV BOLUS
INTRAVENOUS | Status: DC | PRN
  Administered 2023-11-16: 50 mg via INTRAVENOUS

## 2023-11-16 MED ORDER — NA SULFATE-K SULFATE-MG SULF 17.5-3.13-1.6 GM/177ML PO SOLN
0.5000 | Freq: Once | ORAL | Status: AC
  Administered 2023-11-16: 177 mL via ORAL
  Filled 2023-11-16: qty 1

## 2023-11-16 MED ORDER — SIMETHICONE 80 MG PO CHEW
240.0000 mg | CHEWABLE_TABLET | Freq: Once | ORAL | Status: AC
  Administered 2023-11-16: 240 mg via ORAL
  Filled 2023-11-16: qty 3

## 2023-11-16 MED ORDER — SIMETHICONE 80 MG PO CHEW
240.0000 mg | CHEWABLE_TABLET | Freq: Once | ORAL | Status: AC
  Administered 2023-11-17: 240 mg via ORAL
  Filled 2023-11-16: qty 3

## 2023-11-16 NOTE — Plan of Care (Signed)
  Problem: Education: Goal: Knowledge of General Education information will improve Description: Including pain rating scale, medication(s)/side effects and non-pharmacologic comfort measures 11/16/2023 0507 by Alfredia Tawni LABOR, RN Outcome: Progressing 11/16/2023 0507 by Alfredia Tawni LABOR, RN Outcome: Progressing   Problem: Health Behavior/Discharge Planning: Goal: Ability to manage health-related needs will improve 11/16/2023 0507 by Alfredia Tawni LABOR, RN Outcome: Progressing 11/16/2023 0507 by Alfredia Tawni LABOR, RN Outcome: Progressing   Problem: Clinical Measurements: Goal: Ability to maintain clinical measurements within normal limits will improve 11/16/2023 0507 by Alfredia Tawni LABOR, RN Outcome: Progressing 11/16/2023 0507 by Alfredia Tawni LABOR, RN Outcome: Progressing Goal: Will remain free from infection 11/16/2023 0507 by Alfredia Tawni LABOR, RN Outcome: Progressing 11/16/2023 0507 by Alfredia Tawni LABOR, RN Outcome: Progressing Goal: Diagnostic test results will improve 11/16/2023 0507 by Alfredia Tawni LABOR, RN Outcome: Progressing 11/16/2023 0507 by Alfredia Tawni LABOR, RN Outcome: Progressing Goal: Respiratory complications will improve 11/16/2023 0507 by Alfredia Tawni LABOR, RN Outcome: Progressing 11/16/2023 0507 by Alfredia Tawni LABOR, RN Outcome: Progressing Goal: Cardiovascular complication will be avoided 11/16/2023 0507 by Alfredia Tawni LABOR, RN Outcome: Progressing 11/16/2023 0507 by Alfredia Tawni LABOR, RN Outcome: Progressing   Problem: Activity: Goal: Risk for activity intolerance will decrease 11/16/2023 0507 by Alfredia Tawni LABOR, RN Outcome: Progressing 11/16/2023 0507 by Alfredia Tawni LABOR, RN Outcome: Progressing   Problem: Nutrition: Goal: Adequate nutrition will be maintained 11/16/2023 0507 by Alfredia Tawni LABOR, RN Outcome: Progressing 11/16/2023 0507 by Alfredia Tawni LABOR, RN Outcome: Progressing   Problem: Coping: Goal: Level  of anxiety will decrease 11/16/2023 0507 by Alfredia Tawni LABOR, RN Outcome: Progressing 11/16/2023 0507 by Alfredia Tawni LABOR, RN Outcome: Progressing

## 2023-11-16 NOTE — Anesthesia Postprocedure Evaluation (Signed)
 Anesthesia Post Note  Patient: Sarah Hendricks  Procedure(s) Performed: ENTEROSCOPY     Patient location during evaluation: PACU Anesthesia Type: MAC Level of consciousness: awake and alert Pain management: pain level controlled Vital Signs Assessment: post-procedure vital signs reviewed and stable Respiratory status: spontaneous breathing, nonlabored ventilation and respiratory function stable Cardiovascular status: blood pressure returned to baseline and stable Postop Assessment: no apparent nausea or vomiting Anesthetic complications: no   No notable events documented.  Last Vitals:  Vitals:   11/16/23 1410 11/16/23 1420  BP: (!) 115/42 (!) 147/55  Pulse: 79 77  Resp: (!) 22 18  Temp:    SpO2: 97% 94%    Last Pain:  Vitals:   11/16/23 1420  TempSrc:   PainSc: 0-No pain                 Debby FORBES Like

## 2023-11-16 NOTE — Transfer of Care (Signed)
 Immediate Anesthesia Transfer of Care Note  Patient: Sarah Hendricks  Procedure(s) Performed: ENTEROSCOPY  Patient Location: PACU  Anesthesia Type:MAC  Level of Consciousness: awake and drowsy  Airway & Oxygen Therapy: Patient Spontanous Breathing and Patient connected to face mask oxygen  Post-op Assessment: Report given to RN and Post -op Vital signs reviewed and stable  Post vital signs: Reviewed and stable  Last Vitals:  Vitals Value Taken Time  BP 112/64 11/16/23 14:05  Temp    Pulse 77 11/16/23 14:08  Resp 29 11/16/23 14:08  SpO2 97 % 11/16/23 14:08  Vitals shown include unfiled device data.  Last Pain:  Vitals:   11/16/23 1405  TempSrc:   PainSc: 0-No pain         Complications: No notable events documented.

## 2023-11-16 NOTE — Anesthesia Preprocedure Evaluation (Addendum)
 Anesthesia Evaluation  Patient identified by MRN, date of birth, ID band Patient awake    Reviewed: Allergy & Precautions, NPO status , Patient's Chart, lab work & pertinent test results, reviewed documented beta blocker date and time   History of Anesthesia Complications Negative for: history of anesthetic complications  Airway Mallampati: II  TM Distance: >3 FB Neck ROM: Full    Dental  (+) Dental Advisory Given   Pulmonary neg pulmonary ROS   Pulmonary exam normal        Cardiovascular hypertension, Pt. on medications and Pt. on home beta blockers + CAD, + Past MI, + Cardiac Stents, +CHF and + DOE  Normal cardiovascular exam+ dysrhythmias Atrial Fibrillation    '25 TTE - Unchanged apical akinesis. EF 45 to 50%. There is mild concentric left ventricular hypertrophy. Left atrial size was moderately dilated. Mild TR     Neuro/Psych  PSYCHIATRIC DISORDERS Anxiety     negative neurological ROS     GI/Hepatic Neg liver ROS,GERD  Controlled,,  Endo/Other   Na 132   Renal/GU negative Renal ROS     Musculoskeletal  (+) Arthritis ,    Abdominal   Peds  Hematology  (+) Blood dyscrasia, anemia  On eliquis     Anesthesia Other Findings   Reproductive/Obstetrics  Breast cancer                               Anesthesia Physical Anesthesia Plan  ASA: 3  Anesthesia Plan: MAC   Post-op Pain Management: Minimal or no pain anticipated   Induction:   PONV Risk Score and Plan: 2 and Propofol  infusion and Treatment may vary due to age or medical condition  Airway Management Planned: Nasal Cannula and Natural Airway  Additional Equipment: None  Intra-op Plan:   Post-operative Plan:   Informed Consent: I have reviewed the patients History and Physical, chart, labs and discussed the procedure including the risks, benefits and alternatives for the proposed anesthesia with the patient or  authorized representative who has indicated his/her understanding and acceptance.       Plan Discussed with: CRNA and Anesthesiologist  Anesthesia Plan Comments:          Anesthesia Quick Evaluation

## 2023-11-16 NOTE — Anesthesia Preprocedure Evaluation (Signed)
 Anesthesia Evaluation  Patient identified by MRN, date of birth, ID band Patient awake    Reviewed: Allergy & Precautions, H&P , NPO status , Patient's Chart, lab work & pertinent test results  Airway Mallampati: II  TM Distance: >3 FB Neck ROM: Full    Dental no notable dental hx. (+) Teeth Intact, Dental Advisory Given   Pulmonary neg pulmonary ROS   Pulmonary exam normal breath sounds clear to auscultation       Cardiovascular Exercise Tolerance: Good hypertension, Pt. on medications + CAD, + Past MI, + Cardiac Stents and + DOE  + dysrhythmias Atrial Fibrillation  Rhythm:Regular Rate:Normal     Neuro/Psych   Anxiety     negative neurological ROS     GI/Hepatic Neg liver ROS,GERD  Medicated,,  Endo/Other  negative endocrine ROS    Renal/GU negative Renal ROS  negative genitourinary   Musculoskeletal  (+) Arthritis , Osteoarthritis,    Abdominal   Peds  Hematology  (+) Blood dyscrasia, anemia   Anesthesia Other Findings   Reproductive/Obstetrics negative OB ROS                              Anesthesia Physical Anesthesia Plan  ASA: 3  Anesthesia Plan: MAC   Post-op Pain Management: Minimal or no pain anticipated   Induction: Intravenous  PONV Risk Score and Plan: 2 and Propofol  infusion and Treatment may vary due to age or medical condition  Airway Management Planned: Natural Airway and Simple Face Mask  Additional Equipment:   Intra-op Plan:   Post-operative Plan:   Informed Consent: I have reviewed the patients History and Physical, chart, labs and discussed the procedure including the risks, benefits and alternatives for the proposed anesthesia with the patient or authorized representative who has indicated his/her understanding and acceptance.     Dental advisory given  Plan Discussed with: CRNA  Anesthesia Plan Comments:          Anesthesia Quick  Evaluation

## 2023-11-16 NOTE — Consult Note (Addendum)
 Consultation  Referring Provider: TRH/ Tobie Primary Care Physician:  Geofm Glade PARAS, MD Primary Gastroenterologist: Dr. Sherrilyn  Reason for Consultation: Marked anemia, symptomatic with weakness and shortness of breath.  HPI: Sarah Hendricks is a 78 y.o. female with history of atrial fibrillation, status post cardioversion February 2025, on chronic Eliquis , also with history of coronary artery disease prior MI status post PCI, hypertension, and history of breast cancer status postlumpectomy and radiation. She had been seen by her PCP earlier this week due to complaints of fatigue and shortness of breath, labs were done showing hemoglobin 1.2/hematocrit 23.9/MCV 69.4 He was referred to the ER for admission. Patient says she really has not been having any GI symptoms, in retrospect may have noticed that her stools have been a little bit darker recently but not enough that she would have paid attention.  She has not had any change in her bowel habits, no complaints of abdominal pain, no nausea or vomiting, no dysphagia or dyne aphasia.  He does have occasional vague dysphagia which is not new. She has not had any prior history of iron deficiency anemia. He did have colonoscopy done in April 2022 per Dr. Eda with findings of medium size internal hemorrhoids and multiple diverticuli otherwise negative.  She has not had prior EGD.  Labs on admit showed hemoglobin 7.2/hematocrit 25.5/MCV 72.5/platelets 375 Pro time 16.9/INR 1.3 BNP 246/troponin within normal limits Ferritin 6/serum iron 25/TIBC 605/iron sat 4 Stool heme positive  Hemoglobin on repeat last p.m. 7.6/hematocrit 26.0  She has not been transfused since admit. Last dose of Eliquis  was yesterday morning.  Reviewing previous labs her hemoglobin in January 2025 was 10.24 August 2022 hemoglobin 13/MCV 90    Past Medical History:  Diagnosis Date   Allergy    Anxiety    Arthritis    Bimalleolar ankle fracture,  right, closed, initial encounter    Breast cancer (HCC) 1994   lumpectomy and XRT, no chemo   CAD (coronary artery disease) 2008   PTCA, in [place]   GERD (gastroesophageal reflux disease)    History of chicken pox    Hyperlipidemia    Hypertension    Myocardial infarction (HCC) 03/15/2007   stent placed   Vitamin D  deficiency     Past Surgical History:  Procedure Laterality Date   BREAST BIOPSY Left 1994   BREAST LUMPECTOMY Left 1994   CARDIOVERSION N/A 07/15/2023   Procedure: CARDIOVERSION;  Surgeon: Loni Soyla LABOR, MD;  Location: [facility];  Service: Cardiovascular;  Laterality: N/A;   CATARACT EXTRACTION, BILATERAL     CORONARY ANGIOPLASTY WITH STENT PLACEMENT  2008   CRANIECTOMY FOR DEPRESSED SKULL FRACTURE  1986   ORIF ANKLE FRACTURE Right 06/20/2023   Procedure: RIGHT OPEN REDUCTION INTERNAL FIXATION (ORIF) ANKLE FRACTURE;  Surgeon: Genelle Standing, MD;  Location: [facility];  Service: Orthopedics;  Laterality: Right;   TONSILLECTOMY  1966    Prior to Admission medications   Medication Sig Start Date End Date Taking? Authorizing Provider  apixaban  (ELIQUIS ) 5 MG TABS tablet Take 1 tablet (5 mg total) by mouth 2 (two) times daily. 09/30/23  Yes Vannie Reche RAMAN, NP  Cholecalciferol  (EQL VITAMIN D3) 1000 units tablet Take 1 tablet (1,000 Units total) by mouth daily. 06/04/15  Yes Burns, Glade PARAS, MD  escitalopram  (LEXAPRO ) 10 MG tablet TAKE 1 TABLET(10 MG) BY MOUTH DAILY 03/25/23  Yes Burns, Glade PARAS, MD  furosemide  (LASIX ) 20 MG tablet Take one tablet 4 times  per week Patient taking differently: Take 20-40 mg by mouth See admin instructions. Take 40 mg by mouth for 3 days and then take 20 mg rest of time per patient 10/24/23  Yes Lonni Slain, MD  losartan  (COZAAR ) 100 MG tablet TAKE 1 TABLET(100 MG) BY MOUTH DAILY 11/26/22  Yes Hobart Powell BRAVO, MD  propranolol  (INDERAL ) 80 MG tablet Take 1 tablet (80 mg total) by mouth 2 (two) times daily.  11/26/22  Yes Hobart Powell BRAVO, MD  rosuvastatin  (CRESTOR ) 10 MG tablet Take 1 tablet (10 mg total) by mouth daily. Patient taking differently: Take 10 mg by mouth every evening. 11/26/22  Yes Hobart Powell BRAVO, MD  traMADol  (ULTRAM ) 50 MG tablet Take 1 tablet (50 mg total) by mouth every 6 (six) hours as needed. Patient not taking: Reported on 11/15/2023 07/18/23   Genelle Standing, MD    Current Facility-Administered Medications  Medication Dose Route Frequency Provider Last Rate Last Admin   acetaminophen  (TYLENOL ) tablet 650 mg  650 mg Oral Q6H PRN Shona Terry SAILOR, DO   650 mg at 11/16/23 0018   lactated ringers  infusion   Intravenous Continuous Tobie Mario GAILS, MD 75 mL/hr at 11/16/23 0857 New Bag at 11/16/23 0857   melatonin tablet 5 mg  5 mg Oral QHS PRN Shona Terry SAILOR, DO       ondansetron  (ZOFRAN ) tablet 4 mg  4 mg Oral Q6H PRN Tobie Mario GAILS, MD       Or   ondansetron  (ZOFRAN ) injection 4 mg  4 mg Intravenous Q6H PRN Patel, Ekta V, MD       pantoprazole (PROTONIX) injection 40 mg  40 mg Intravenous Q12H Tobie Mario GAILS, MD   40 mg at 11/16/23 0859    Allergies as of 11/15/2023 - Review Complete 11/15/2023  Allergen Reaction Noted   Acetaminophen -codeine Other (See Comments) 01/12/2018   Tigan [trimethobenzamide] Other (See Comments) 01/12/2018   Alendronate  12/23/2020   Atorvastatin  Other (See Comments) 05/31/2019   Codeine  06/12/2018   Simvastatin Other (See Comments) 05/30/2023    Family History  Problem Relation Age of Onset   Breast cancer Mother    Hypertension Mother    Heart disease Mother    Stroke Mother    Arthritis Father    Heart disease Father        died at 44 of heart attack   Heart disease Brother    Stroke Maternal Grandmother    Heart disease Maternal Grandfather    Colon cancer Neg Hx    Esophageal cancer Neg Hx    Rectal cancer Neg Hx    Stomach cancer Neg Hx     Social History   Socioeconomic History   Marital status: Married    Spouse  name: Not on file   Number of children: Not on file   Years of education: Not on file   Highest education level: Some college, no degree  Occupational History   Not on file  Tobacco Use   Smoking status: Never    Passive exposure: Never   Smokeless tobacco: Never  Vaping Use   Vaping status: Never Used  Substance and Sexual Activity   Alcohol use: No   Drug use: No   Sexual activity: Not Currently    Birth control/protection: Post-menopausal  Other Topics Concern   Not on file  Social History Narrative   Married, lives with spouse   Retired from [employer] irregularly, rides an exercise  bike   Social Drivers of Corporate investment banker Strain: Low Risk  (09/27/2023)   Overall Financial Resource Strain (CARDIA)    Difficulty of Paying Living Expenses: Not hard at all  Food Insecurity: No Food Insecurity (11/15/2023)   Hunger Vital Sign    Worried About Running Out of Food in the Last Year: Never true    Ran Out of Food in the Last Year: Never true  Transportation Needs: No Transportation Needs (11/15/2023)   PRAPARE - Administrator, Civil Service (Medical): No    Lack of Transportation (Non-Medical): No  Physical Activity: Insufficiently Active (09/27/2023)   Exercise Vital Sign    Days of Exercise per Week: 3 days    Minutes of Exercise per Session: 20 min  Stress: No Stress Concern Present (09/27/2023)   [employer] of Occupational Health - Occupational Stress Questionnaire    Feeling of Stress : Not at all  Social Connections: Moderately Integrated (11/15/2023)   Social Connection and Isolation Panel    Frequency of Communication with Friends and Family: More than three times a week    Frequency of Social Gatherings with Friends and Family: Twice a week    Attends Religious Services: More than 4 times per year    Active Member of [employer] or Organizations: No    Attends Banker Meetings: Never    Marital Status: Married   Catering manager Violence: Not At Risk (11/15/2023)   Humiliation, Afraid, Rape, and Kick questionnaire    Fear of Current or Ex-Partner: No    Emotionally Abused: No    Physically Abused: No    Sexually Abused: No    Review of Systems: Pertinent positive and negative review of systems were noted in the above HPI section.  All other review of systems was otherwise negative.   Physical Exam: Vital signs in last 24 hours: Temp:  [97.8 F (36.6 C)-98.4 F (36.9 C)] 97.8 F (36.6 C) (07/02 0853) Pulse Rate:  [64-79] 71 (07/02 0853) Resp:  [14-20] 16 (07/02 0853) BP: (123-144)/(47-62) 139/62 (07/02 0853) SpO2:  [91 %-100 %] 94 % (07/02 0853) Weight:  [68 kg] 68 kg (07/02 0500)   General:   Alert,  Well-developed, well-nourished elderly white female, pleasant and cooperative in NAD, family at bedside Head:  Normocephalic and atraumatic. Eyes:  Sclera clear, no icterus.   Conjunctiva pale Ears:  Normal auditory acuity. Nose:  No deformity, discharge,  or lesions. Mouth:  No deformity or lesions.   Neck:  Supple; no masses or thyromegaly. Lungs:  Clear throughout to auscultation.   No wheezes, crackles, or rhonchi.  Heart:  Regular rate and rhythm; no murmurs, clicks, rubs,  or gallops. Abdomen:  Soft,nontender, BS active,nonpalp mass or hsm.   Rectal: Not done Msk:  Symmetrical without gross deformities. . Pulses:  Normal pulses noted. Extremities:  Without clubbing or edema. Neurologic:  Alert and  oriented x4;  grossly normal neurologically. Skin:  Intact without significant lesions or rashes.. Psych:  Alert and cooperative. Normal mood and affect.  Intake/Output from previous day: 07/01 0701 - 07/02 0700 In: 879.6 [P.O.:250; I.V.:629.6] Out: -  Intake/Output this shift: No intake/output data recorded.  Lab Results: Recent Labs    11/14/23 1655 11/15/23 1223 11/15/23 2001  WBC 8.1 8.4  --   HGB 7.2 Repeated and verified X2.* 7.2* 7.6*  HCT 23.9 Repeated and verified  X2.* 25.5* 26.0*  PLT 360.0 375  --    BMET Recent Labs  11/14/23 1655 11/15/23 1223  NA 133* 132*  K 3.9 3.8  CL 96 96*  CO2 29 26  GLUCOSE 95 121*  BUN 16 17  CREATININE 0.93 1.14*  CALCIUM  9.6 9.4   LFT Recent Labs    11/15/23 1223  PROT 6.6  ALBUMIN 3.5  AST 40  ALT 15  ALKPHOS 66  BILITOT 1.2   PT/INR Recent Labs    11/15/23 1223  LABPROT 16.9*  INR 1.3*   Hepatitis Panel No results for input(s): HEPBSAG, HCVAB, HEPAIGM, HEPBIGM in the last 72 hours.   IMPRESSION:  #22 78 year old female admitted with symptomatic microcytic anemia, with hemoglobin of 7.2, in setting of chronic Eliquis  use  Patient had a mild anemia noted January 2025, which has progressed Iron studies here consistent with iron deficiency  Patient has not really had any GI symptoms, primarily just fatigue and recent dyspnea on exertion over the past month or so.  Etiology of the iron deficiency anemia is not clear, she had fairly recent colonoscopy in April 2022 which was negative.  Rule out chronic gastropathy, AVMs, occult neoplasm, Cameron erosions  #2 GERD-mild not on any meds as an outpatient #3 history of atrial fibrillation status post cardioversion February 2025 #4 coronary artery disease status post previous MI and PCI 5.  Osteoarthritis  Plan; will keep patient n.p.o. this a.m. Patient will be scheduled for EGD/enteroscopy this afternoon with Dr. Leigh.  Procedure was discussed in detail with the patient including indications risk benefits and she is agreeable to proceed.  If above procedure unrevealing then she may require repeat colonoscopy. Continue to hold Eliquis  Patient would benefit from IV iron during this admission and may need further iron infusions as an outpatient. Could argue to transfuse as she has underlying coronary artery disease and is symptomatic with fatigue and shortness of breath.  Certainly would aim to keep her hemoglobin above 7  GI  will follow with you    Brelyn Woehl PA-C 11/16/2023, 10:47 AM

## 2023-11-16 NOTE — H&P (View-Only) (Signed)
 Consultation  Referring Provider: TRH/ Tobie Primary Care Physician:  Geofm Glade PARAS, MD Primary Gastroenterologist: Dr. Sherrilyn  Reason for Consultation: Marked anemia, symptomatic with weakness and shortness of breath.  HPI: Sarah Hendricks is a 78 y.o. female with history of atrial fibrillation, status post cardioversion February 2025, on chronic Eliquis , also with history of coronary artery disease prior MI status post PCI, hypertension, and history of breast cancer status postlumpectomy and radiation. She had been seen by her PCP earlier this week due to complaints of fatigue and shortness of breath, labs were done showing hemoglobin 1.2/hematocrit 23.9/MCV 69.4 He was referred to the ER for admission. Patient says she really has not been having any GI symptoms, in retrospect may have noticed that her stools have been a little bit darker recently but not enough that she would have paid attention.  She has not had any change in her bowel habits, no complaints of abdominal pain, no nausea or vomiting, no dysphagia or dyne aphasia.  He does have occasional vague dysphagia which is not new. She has not had any prior history of iron deficiency anemia. He did have colonoscopy done in April 2022 per Dr. Eda with findings of medium size internal hemorrhoids and multiple diverticuli otherwise negative.  She has not had prior EGD.  Labs on admit showed hemoglobin 7.2/hematocrit 25.5/MCV 72.5/platelets 375 Pro time 16.9/INR 1.3 BNP 246/troponin within normal limits Ferritin 6/serum iron 25/TIBC 605/iron sat 4 Stool heme positive  Hemoglobin on repeat last p.m. 7.6/hematocrit 26.0  She has not been transfused since admit. Last dose of Eliquis  was yesterday morning.  Reviewing previous labs her hemoglobin in January 2025 was 10.24 August 2022 hemoglobin 13/MCV 90    Past Medical History:  Diagnosis Date   Allergy    Anxiety    Arthritis    Bimalleolar ankle fracture,  right, closed, initial encounter    Breast cancer (HCC) 1994   lumpectomy and XRT, no chemo   CAD (coronary artery disease) 2008   PTCA, in [place]   GERD (gastroesophageal reflux disease)    History of chicken pox    Hyperlipidemia    Hypertension    Myocardial infarction (HCC) 03/15/2007   stent placed   Vitamin D  deficiency     Past Surgical History:  Procedure Laterality Date   BREAST BIOPSY Left 1994   BREAST LUMPECTOMY Left 1994   CARDIOVERSION N/A 07/15/2023   Procedure: CARDIOVERSION;  Surgeon: Loni Soyla LABOR, MD;  Location: [facility];  Service: Cardiovascular;  Laterality: N/A;   CATARACT EXTRACTION, BILATERAL     CORONARY ANGIOPLASTY WITH STENT PLACEMENT  2008   CRANIECTOMY FOR DEPRESSED SKULL FRACTURE  1986   ORIF ANKLE FRACTURE Right 06/20/2023   Procedure: RIGHT OPEN REDUCTION INTERNAL FIXATION (ORIF) ANKLE FRACTURE;  Surgeon: Genelle Standing, MD;  Location: [facility];  Service: Orthopedics;  Laterality: Right;   TONSILLECTOMY  1966    Prior to Admission medications   Medication Sig Start Date End Date Taking? Authorizing Provider  apixaban  (ELIQUIS ) 5 MG TABS tablet Take 1 tablet (5 mg total) by mouth 2 (two) times daily. 09/30/23  Yes Vannie Reche RAMAN, NP  Cholecalciferol  (EQL VITAMIN D3) 1000 units tablet Take 1 tablet (1,000 Units total) by mouth daily. 06/04/15  Yes Burns, Glade PARAS, MD  escitalopram  (LEXAPRO ) 10 MG tablet TAKE 1 TABLET(10 MG) BY MOUTH DAILY 03/25/23  Yes Burns, Glade PARAS, MD  furosemide  (LASIX ) 20 MG tablet Take one tablet 4 times  per week Patient taking differently: Take 20-40 mg by mouth See admin instructions. Take 40 mg by mouth for 3 days and then take 20 mg rest of time per patient 10/24/23  Yes Lonni Slain, MD  losartan  (COZAAR ) 100 MG tablet TAKE 1 TABLET(100 MG) BY MOUTH DAILY 11/26/22  Yes Hobart Powell BRAVO, MD  propranolol  (INDERAL ) 80 MG tablet Take 1 tablet (80 mg total) by mouth 2 (two) times daily.  11/26/22  Yes Hobart Powell BRAVO, MD  rosuvastatin  (CRESTOR ) 10 MG tablet Take 1 tablet (10 mg total) by mouth daily. Patient taking differently: Take 10 mg by mouth every evening. 11/26/22  Yes Hobart Powell BRAVO, MD  traMADol  (ULTRAM ) 50 MG tablet Take 1 tablet (50 mg total) by mouth every 6 (six) hours as needed. Patient not taking: Reported on 11/15/2023 07/18/23   Genelle Standing, MD    Current Facility-Administered Medications  Medication Dose Route Frequency Provider Last Rate Last Admin   acetaminophen  (TYLENOL ) tablet 650 mg  650 mg Oral Q6H PRN Shona Terry SAILOR, DO   650 mg at 11/16/23 0018   lactated ringers  infusion   Intravenous Continuous Tobie Mario GAILS, MD 75 mL/hr at 11/16/23 0857 New Bag at 11/16/23 0857   melatonin tablet 5 mg  5 mg Oral QHS PRN Shona Terry SAILOR, DO       ondansetron  (ZOFRAN ) tablet 4 mg  4 mg Oral Q6H PRN Tobie Mario GAILS, MD       Or   ondansetron  (ZOFRAN ) injection 4 mg  4 mg Intravenous Q6H PRN Patel, Ekta V, MD       pantoprazole (PROTONIX) injection 40 mg  40 mg Intravenous Q12H Tobie Mario GAILS, MD   40 mg at 11/16/23 0859    Allergies as of 11/15/2023 - Review Complete 11/15/2023  Allergen Reaction Noted   Acetaminophen -codeine Other (See Comments) 01/12/2018   Tigan [trimethobenzamide] Other (See Comments) 01/12/2018   Alendronate  12/23/2020   Atorvastatin  Other (See Comments) 05/31/2019   Codeine  06/12/2018   Simvastatin Other (See Comments) 05/30/2023    Family History  Problem Relation Age of Onset   Breast cancer Mother    Hypertension Mother    Heart disease Mother    Stroke Mother    Arthritis Father    Heart disease Father        died at 44 of heart attack   Heart disease Brother    Stroke Maternal Grandmother    Heart disease Maternal Grandfather    Colon cancer Neg Hx    Esophageal cancer Neg Hx    Rectal cancer Neg Hx    Stomach cancer Neg Hx     Social History   Socioeconomic History   Marital status: Married    Spouse  name: Not on file   Number of children: Not on file   Years of education: Not on file   Highest education level: Some college, no degree  Occupational History   Not on file  Tobacco Use   Smoking status: Never    Passive exposure: Never   Smokeless tobacco: Never  Vaping Use   Vaping status: Never Used  Substance and Sexual Activity   Alcohol use: No   Drug use: No   Sexual activity: Not Currently    Birth control/protection: Post-menopausal  Other Topics Concern   Not on file  Social History Narrative   Married, lives with spouse   Retired from [employer] irregularly, rides an exercise  bike   Social Drivers of Corporate investment banker Strain: Low Risk  (09/27/2023)   Overall Financial Resource Strain (CARDIA)    Difficulty of Paying Living Expenses: Not hard at all  Food Insecurity: No Food Insecurity (11/15/2023)   Hunger Vital Sign    Worried About Running Out of Food in the Last Year: Never true    Ran Out of Food in the Last Year: Never true  Transportation Needs: No Transportation Needs (11/15/2023)   PRAPARE - Administrator, Civil Service (Medical): No    Lack of Transportation (Non-Medical): No  Physical Activity: Insufficiently Active (09/27/2023)   Exercise Vital Sign    Days of Exercise per Week: 3 days    Minutes of Exercise per Session: 20 min  Stress: No Stress Concern Present (09/27/2023)   [employer] of Occupational Health - Occupational Stress Questionnaire    Feeling of Stress : Not at all  Social Connections: Moderately Integrated (11/15/2023)   Social Connection and Isolation Panel    Frequency of Communication with Friends and Family: More than three times a week    Frequency of Social Gatherings with Friends and Family: Twice a week    Attends Religious Services: More than 4 times per year    Active Member of [employer] or Organizations: No    Attends Banker Meetings: Never    Marital Status: Married   Catering manager Violence: Not At Risk (11/15/2023)   Humiliation, Afraid, Rape, and Kick questionnaire    Fear of Current or Ex-Partner: No    Emotionally Abused: No    Physically Abused: No    Sexually Abused: No    Review of Systems: Pertinent positive and negative review of systems were noted in the above HPI section.  All other review of systems was otherwise negative.   Physical Exam: Vital signs in last 24 hours: Temp:  [97.8 F (36.6 C)-98.4 F (36.9 C)] 97.8 F (36.6 C) (07/02 0853) Pulse Rate:  [64-79] 71 (07/02 0853) Resp:  [14-20] 16 (07/02 0853) BP: (123-144)/(47-62) 139/62 (07/02 0853) SpO2:  [91 %-100 %] 94 % (07/02 0853) Weight:  [68 kg] 68 kg (07/02 0500)   General:   Alert,  Well-developed, well-nourished elderly white female, pleasant and cooperative in NAD, family at bedside Head:  Normocephalic and atraumatic. Eyes:  Sclera clear, no icterus.   Conjunctiva pale Ears:  Normal auditory acuity. Nose:  No deformity, discharge,  or lesions. Mouth:  No deformity or lesions.   Neck:  Supple; no masses or thyromegaly. Lungs:  Clear throughout to auscultation.   No wheezes, crackles, or rhonchi.  Heart:  Regular rate and rhythm; no murmurs, clicks, rubs,  or gallops. Abdomen:  Soft,nontender, BS active,nonpalp mass or hsm.   Rectal: Not done Msk:  Symmetrical without gross deformities. . Pulses:  Normal pulses noted. Extremities:  Without clubbing or edema. Neurologic:  Alert and  oriented x4;  grossly normal neurologically. Skin:  Intact without significant lesions or rashes.. Psych:  Alert and cooperative. Normal mood and affect.  Intake/Output from previous day: 07/01 0701 - 07/02 0700 In: 879.6 [P.O.:250; I.V.:629.6] Out: -  Intake/Output this shift: No intake/output data recorded.  Lab Results: Recent Labs    11/14/23 1655 11/15/23 1223 11/15/23 2001  WBC 8.1 8.4  --   HGB 7.2 Repeated and verified X2.* 7.2* 7.6*  HCT 23.9 Repeated and verified  X2.* 25.5* 26.0*  PLT 360.0 375  --    BMET Recent Labs  11/14/23 1655 11/15/23 1223  NA 133* 132*  K 3.9 3.8  CL 96 96*  CO2 29 26  GLUCOSE 95 121*  BUN 16 17  CREATININE 0.93 1.14*  CALCIUM  9.6 9.4   LFT Recent Labs    11/15/23 1223  PROT 6.6  ALBUMIN 3.5  AST 40  ALT 15  ALKPHOS 66  BILITOT 1.2   PT/INR Recent Labs    11/15/23 1223  LABPROT 16.9*  INR 1.3*   Hepatitis Panel No results for input(s): HEPBSAG, HCVAB, HEPAIGM, HEPBIGM in the last 72 hours.   IMPRESSION:  #22 78 year old female admitted with symptomatic microcytic anemia, with hemoglobin of 7.2, in setting of chronic Eliquis  use  Patient had a mild anemia noted January 2025, which has progressed Iron studies here consistent with iron deficiency  Patient has not really had any GI symptoms, primarily just fatigue and recent dyspnea on exertion over the past month or so.  Etiology of the iron deficiency anemia is not clear, she had fairly recent colonoscopy in April 2022 which was negative.  Rule out chronic gastropathy, AVMs, occult neoplasm, Cameron erosions  #2 GERD-mild not on any meds as an outpatient #3 history of atrial fibrillation status post cardioversion February 2025 #4 coronary artery disease status post previous MI and PCI 5.  Osteoarthritis  Plan; will keep patient n.p.o. this a.m. Patient will be scheduled for EGD/enteroscopy this afternoon with Dr. Leigh.  Procedure was discussed in detail with the patient including indications risk benefits and she is agreeable to proceed.  If above procedure unrevealing then she may require repeat colonoscopy. Continue to hold Eliquis  Patient would benefit from IV iron during this admission and may need further iron infusions as an outpatient. Could argue to transfuse as she has underlying coronary artery disease and is symptomatic with fatigue and shortness of breath.  Certainly would aim to keep her hemoglobin above 7  GI  will follow with you    Brelyn Woehl PA-C 11/16/2023, 10:47 AM

## 2023-11-16 NOTE — Plan of Care (Signed)

## 2023-11-16 NOTE — Progress Notes (Signed)
 PROGRESS NOTE    Sarah Hendricks  FMW:[id number] DOB: 1946/04/28 DOA: 11/15/2023 PCP: Geofm Glade PARAS, MD   Brief Narrative:    78 y.o. female with past medical history of hx of paroxysmal atrial fibrillation,  osteoarthritis, CAD/MI s/p PCI, HTN, HLD breast cancer s/p lumpectomy and radiation, GERD and vitamin D  deficiency who presented with anemia and feeling weak and tired, found to have anemia, GI has been consulted and eliquis  has been held.  Assessment & Plan:  78 y.o. female with past medical history of hx of paroxysmal atrial fibrillation,  osteoarthritis, CAD/MI s/p PCI, HTN, HLD breast cancer s/p lumpectomy and radiation, GERD and vitamin D  deficiency who presented with anemia and feeling weak and tired, found to have anemia, GI has been consulted and eliquis  has been held.   Acute blood loss anemia, POA: Most recent Hb 7.6.  Patient is having dark stool for the past few days.  Continue with intravenous Protonix.  GI has been consulted. Last colonoscopy was 4 years back which was unremarkable.  Patient never had EGD done.  She denies use of aspirin or any other NSAIDs. Follow-up H&H closely and transfuse if necessary.  Longstanding persistent atrial fibrillation, POA: Patient was started on Eliquis  back in January 2025.  She does follow-up with a cardiologist and her last appointment was almost 2 weeks back.  Eliquis  has been held at this time because of acute blood loss anemia. Continue to monitor on telemetry.  Patient is currently rate controlled.  She is aware of the risk of ischemic stroke while off of anticoagulation in the setting of atrial fibrillation.  CAD status post stenting,POA:  PCI was done in 2008.  Is not on aspirin or Plavix.  Continue with statin therapy.  Outpatient follow-up with cardiology.  No active cardiac issues.  Patient denies any chest pain.  Essential hypertension, POA: As needed intravenous hydralazine for systolic blood pressure greater than 160.   Continue to monitor blood pressure closely.  Heart failure with mildly reduced ejection fraction, POA: NYHA class III .  Not in exacerbation.  Patient does take Lasix  at home 20 mg daily.  Continue to monitor closely.  Outpatient follow-up with cardiology.  Fluid restriction of 1.5 L/day, Daily weight and strict intake output monitoring. Echocardiogram done in January 2025 showed EF of 45 to 50% indicating mild reduced action fraction.  I have discussed the case in length with the patient's daughter as well as husband, both present at the bedside and answered all of their questions.    DVT prophylaxis: SCDs Start: 11/15/23 1528     Code Status: Full Code Family Communication:   Status is: Inpatient Remains inpatient appropriate because: Acute blood loss anemia   Subjective: She says she feels better this morning. Husband and daughter present at the bedside. She was started on eliquis  in Jan 2025. She has noticed dark stools since past few days associated with fatigue and shortness of breath. Denies any prior GI Bleeds. Colonoscopy done 4 years back was unremarkable.Never had EGD done. She denies use or Aspirin or other NSAIDs.   Examination:  General exam: Appears calm and comfortable  Respiratory system: Clear to auscultation. Respiratory effort normal. Cardiovascular system: Irregularly irregular rhythm, normal rate Gastrointestinal system: Abdomen is nondistended, soft and nontender. No organomegaly or masses felt. Normal bowel sounds heard. Central nervous system: Alert and oriented. No focal neurological deficits. Extremities: Symmetric 5 x 5 power. Skin: No rashes, lesions or ulcers Psychiatry: Judgement and insight appear normal. Mood &  affect appropriate.     Diet Orders (From admission, onward)     Start     Ordered   11/16/23 0001  Diet NPO time specified  Diet effective midnight        11/15/23 1731            Objective: Vitals:   11/15/23 2051 11/16/23  0016 11/16/23 0458 11/16/23 0500  BP:  (!) 140/51 (!) 135/52   Pulse:  79 76   Resp:  19 20   Temp:  98.2 F (36.8 C) 98 F (36.7 C)   TempSrc:   Oral   SpO2:  96% 92%   Weight:    68 kg  Height: 5' 3 (1.6 m)       Intake/Output Summary (Last 24 hours) at 11/16/2023 0828 Last data filed at 11/16/2023 0408 Gross per 24 hour  Intake 879.56 ml  Output --  Net 879.56 ml   Filed Weights   11/15/23 1225 11/16/23 0500  Weight: 68 kg 68 kg    Scheduled Meds:  pantoprazole (PROTONIX) IV  40 mg Intravenous Q12H   Continuous Infusions:  lactated ringers  75 mL/hr at 11/16/23 0408    Nutritional status     Body mass index is 26.56 kg/m.  Data Reviewed:   CBC: Recent Labs  Lab 11/14/23 1655 11/15/23 1223 11/15/23 2001  WBC 8.1 8.4  --   NEUTROABS 4.9  --   --   HGB 7.2 Repeated and verified X2.* 7.2* 7.6*  HCT 23.9 Repeated and verified X2.* 25.5* 26.0*  MCV 69.4* 75.2*  --   PLT 360.0 375  --    Basic Metabolic Panel: Recent Labs  Lab 11/14/23 1655 11/15/23 1223  NA 133* 132*  K 3.9 3.8  CL 96 96*  CO2 29 26  GLUCOSE 95 121*  BUN 16 17  CREATININE 0.93 1.14*  CALCIUM  9.6 9.4   GFR: Estimated Creatinine Clearance: 38.8 mL/min (A) (by C-G formula based on SCr of 1.14 mg/dL (H)). Liver Function Tests: Recent Labs  Lab 11/14/23 1655 11/15/23 1223  AST 20 40  ALT 13 15  ALKPHOS 74 66  BILITOT 0.6 1.2  PROT 6.9 6.6  ALBUMIN 4.1 3.5   No results for input(s): LIPASE, AMYLASE in the last 168 hours. No results for input(s): AMMONIA in the last 168 hours. Coagulation Profile: Recent Labs  Lab 11/15/23 1223  INR 1.3*   Cardiac Enzymes: No results for input(s): CKTOTAL, CKMB, CKMBINDEX, TROPONINI in the last 168 hours. BNP (last 3 results) Recent Labs    11/14/23 1655  PROBNP 429.0*   HbA1C: Recent Labs    11/15/23 1234  HGBA1C 5.4   CBG: No results for input(s): GLUCAP in the last 168 hours. Lipid Profile: No results for  input(s): CHOL, HDL, LDLCALC, TRIG, CHOLHDL, LDLDIRECT in the last 72 hours. Thyroid  Function Tests: No results for input(s): TSH, T4TOTAL, FREET4, T3FREE, THYROIDAB in the last 72 hours. Anemia Panel: Recent Labs    11/15/23 1445 11/15/23 1530  VITAMINB12  --  298  FOLATE  --  >40.0  FERRITIN 6*  --   TIBC 605*  --   IRON 25*  --   RETICCTPCT  --  2.9   Sepsis Labs: No results for input(s): PROCALCITON, LATICACIDVEN in the last 168 hours.  No results found for this or any previous visit (from the past 240 hours).       Radiology Studies: DG Chest 2 View Result Date: 11/15/2023 CLINICAL DATA:  SOB EXAM: CHEST - 2 VIEW COMPARISON:  November 14, 2023 FINDINGS: Biapical pleural thickening. Similar patchy opacities in the left lung base peripherally, corresponding to prior region of subpleural scarring. No new airspace consolidation, pleural effusion, or pneumothorax. Calcified mediastinal lymph nodes. No cardiomegaly. Tortuous aorta with aortic atherosclerosis. No acute fracture or destructive lesions. Multilevel thoracic osteophytosis. Surgical clips in the left breast. IMPRESSION: No acute cardiopulmonary abnormality. Electronically Signed   By: Rogelia Myers M.D.   On: 11/15/2023 13:18   DG Chest 2 View Result Date: 11/14/2023 CLINICAL DATA:  Dyspnea on exertion EXAM: CHEST - 2 VIEW COMPARISON:  None Available. FINDINGS: Normal mediastinum. Calcified mediastinal lymph nodes noted. Chronic bronchitic markings in lungs unchanged. Trace pleural effusions. No focal consolidation. No pneumothorax. Biopsy clips in the LEFT breast. IMPRESSION: 1. Chronic bronchitic markings. 2. Trace pleural effusions. Electronically Signed   By: Jackquline Boxer M.D.   On: 11/14/2023 17:30       LOS: 1 day   Time spent= 41 mins    Deliliah Room, MD [facility]  If 7PM-7AM, please contact night-coverage  11/16/2023, 8:28 AM

## 2023-11-16 NOTE — Op Note (Signed)
 [facility] Patient Name: Sarah Hendricks Procedure Date : 11/16/2023 MRN: [id number] Attending MD: Elspeth SQUIBB. Leigh , MD, [id number] Date of Birth: 01/15/1946 CSN: [id number] Age: 78 Admit Type: Inpatient Procedure:                Small bowel enteroscopy Indications:              Iron deficiency anemia - on Eliquis , negative                            colonoscopy 2022 Providers:                Elspeth SQUIBB. Leigh, MD, Ozell Pouch, [place], Technician Referring MD:              Medicines:                Monitored Anesthesia Care Complications:            No immediate complications. Estimated blood loss:                            Minimal. Estimated Blood Loss:     Estimated blood loss was minimal. Procedure:                Pre-Anesthesia Assessment:                           - Prior to the procedure, a History and Physical                            was performed, and patient medications and                            allergies were reviewed. The patient's tolerance of                            previous anesthesia was also reviewed. The risks                            and benefits of the procedure and the sedation                            options and risks were discussed with the patient.                            All questions were answered, and informed consent                            was obtained. Prior Anticoagulants: The patient has                            taken Eliquis  (apixaban ), last dose was 1 day prior  to procedure. ASA Grade Assessment: III - A patient                            with severe systemic disease. After reviewing the                            risks and benefits, the patient was deemed in                            satisfactory condition to undergo the procedure.                           After obtaining informed consent, the endoscope was                             passed under direct vision. Throughout the                            procedure, the patient's blood pressure, pulse, and                            oxygen saturations were monitored continuously. The                            [id number]-[id number] ([id number]) Olympus peds colonoscope was                            introduced through the mouth and advanced to the                            proximal jejunum. The small bowel enteroscopy was                            accomplished without difficulty. The patient                            tolerated the procedure well. Scope In: Scope Out: Findings:      Esophagogastric landmarks were identified: the Z-line was found at 35       cm, the gastroesophageal junction was found at 35 cm and the upper       extent of the gastric folds was found at 37 cm from the incisors.      A 2 cm hiatal hernia was present.      The Z-line was regular.      The exam of the esophagus was otherwise normal.      The entire examined stomach was normal.      A diverticulum was found in the second portion of the duodenum.      The exam of the duodenum was otherwise normal. There was some endoscopic       irritation with withdrawal through duodenal sweep (angulated sweep)      There was no evidence of significant pathology in the proximal jejunum.       Some endoscopic irritation noted in a portion of the proximal jejunum       with withdrawal.  Impression:               - Esophagogastric landmarks identified.                           - 2 cm hiatal hernia.                           - Z-line regular.                           - Normal esophagus otherwise.                           - Normal stomach.                           - Duodenal diverticulum.                           - Normal duodenum otherwise.                           - The examined portion of the jejunum was normal.                           No cause for iron deficiency on enteroscopy. Recommendation:           -  Return patient to hospital ward for ongoing care.                           - Clear liquid diet.                           - Continue present medications.                           - Recommend colonoscopy as next steps. Can do                            inpatient tomorrow if she is willing to stay and                            prep tonight vs. expedited outpatient evaluation,                            will discuss with the patient                           - Transfuse 1 unit PRBC for symptoms if patient                            willing, and dose if IV iron Procedure Code(s):        --- Professional ---                           [phone], Small intestinal endoscopy, enteroscopy  beyond second portion of duodenum, not including                            ileum; diagnostic, including collection of                            specimen(s) by brushing or washing, when performed                            (separate procedure) Diagnosis Code(s):        --- Professional ---                           K44.9, Diaphragmatic hernia without obstruction or                            gangrene                           D50.9, Iron deficiency anemia, unspecified                           K57.10, Diverticulosis of small intestine without                            perforation or abscess without bleeding CPT copyright 2022 American Medical Association. All rights reserved. The codes documented in this report are preliminary and upon coder review may  be revised to meet current compliance requirements. Elspeth P. Pricila Bridge, MD 11/16/2023 2:06:06 PM This report has been signed electronically. Number of Addenda: 0

## 2023-11-17 ENCOUNTER — Encounter (HOSPITAL_COMMUNITY): Payer: Self-pay | Admitting: Anesthesiology

## 2023-11-17 ENCOUNTER — Other Ambulatory Visit: Payer: Self-pay

## 2023-11-17 ENCOUNTER — Telehealth: Payer: Self-pay

## 2023-11-17 ENCOUNTER — Encounter (HOSPITAL_COMMUNITY): Payer: Self-pay | Admitting: Internal Medicine

## 2023-11-17 ENCOUNTER — Encounter (HOSPITAL_COMMUNITY)
Admission: EM | Disposition: A | Discharge status: Home / Self Care | Payer: Self-pay | Source: Home / Self Care | Attending: Internal Medicine

## 2023-11-17 DIAGNOSIS — K648 Other hemorrhoids: Secondary | ICD-10-CM | POA: Diagnosis not present

## 2023-11-17 DIAGNOSIS — K5730 Diverticulosis of large intestine without perforation or abscess without bleeding: Secondary | ICD-10-CM

## 2023-11-17 DIAGNOSIS — D62 Acute posthemorrhagic anemia: Secondary | ICD-10-CM | POA: Diagnosis not present

## 2023-11-17 DIAGNOSIS — D649 Anemia, unspecified: Secondary | ICD-10-CM

## 2023-11-17 DIAGNOSIS — I2510 Atherosclerotic heart disease of native coronary artery without angina pectoris: Secondary | ICD-10-CM

## 2023-11-17 DIAGNOSIS — D509 Iron deficiency anemia, unspecified: Secondary | ICD-10-CM

## 2023-11-17 DIAGNOSIS — I252 Old myocardial infarction: Secondary | ICD-10-CM

## 2023-11-17 HISTORY — PX: COLONOSCOPY: SHX5424

## 2023-11-17 LAB — CBC WITH DIFFERENTIAL/PLATELET
Abs Immature Granulocytes: 0.02 10*3/uL (ref 0.00–0.07)
Basophils Absolute: 0 10*3/uL (ref 0.0–0.1)
Basophils Relative: 1 %
Eosinophils Absolute: 0.3 10*3/uL (ref 0.0–0.5)
Eosinophils Relative: 4 %
HCT: 23.5 % — ABNORMAL LOW (ref 36.0–46.0)
Hemoglobin: 6.5 g/dL — CL (ref 12.0–15.0)
Immature Granulocytes: 0 %
Lymphocytes Relative: 18 %
Lymphs Abs: 1.1 10*3/uL (ref 0.7–4.0)
MCH: 20.8 pg — ABNORMAL LOW (ref 26.0–34.0)
MCHC: 27.7 g/dL — ABNORMAL LOW (ref 30.0–36.0)
MCV: 75.3 fL — ABNORMAL LOW (ref 80.0–100.0)
Monocytes Absolute: 0.7 10*3/uL (ref 0.1–1.0)
Monocytes Relative: 12 %
Neutro Abs: 3.8 10*3/uL (ref 1.7–7.7)
Neutrophils Relative %: 65 %
Platelets: 317 10*3/uL (ref 150–400)
RBC: 3.12 MIL/uL — ABNORMAL LOW (ref 3.87–5.11)
RDW: 19.7 % — ABNORMAL HIGH (ref 11.5–15.5)
WBC: 6 10*3/uL (ref 4.0–10.5)
nRBC: 0 % (ref 0.0–0.2)

## 2023-11-17 LAB — PREPARE RBC (CROSSMATCH)

## 2023-11-17 LAB — HEMOGLOBIN AND HEMATOCRIT, BLOOD
HCT: 28.4 % — ABNORMAL LOW (ref 36.0–46.0)
Hemoglobin: 8.2 g/dL — ABNORMAL LOW (ref 12.0–15.0)

## 2023-11-17 SURGERY — COLONOSCOPY
Anesthesia: Monitor Anesthesia Care

## 2023-11-17 MED ORDER — PANTOPRAZOLE SODIUM 40 MG PO TBEC: 40.0000 mg | DELAYED_RELEASE_TABLET | Freq: Every day | ORAL | 1 refills | Status: DC

## 2023-11-17 MED ORDER — SODIUM CHLORIDE 0.9% IV SOLUTION: Freq: Once | INTRAVENOUS | Status: AC

## 2023-11-17 MED ORDER — APIXABAN 5 MG PO TABS: 5.0000 mg | ORAL_TABLET | Freq: Two times a day (BID) | ORAL | 1 refills | Status: DC

## 2023-11-17 MED ORDER — FERROUS SULFATE 325 (65 FE) MG PO TBEC: 325.0000 mg | DELAYED_RELEASE_TABLET | Freq: Two times a day (BID) | ORAL | 3 refills | Status: AC

## 2023-11-17 MED ORDER — PROPOFOL 500 MG/50ML IV EMUL
INTRAVENOUS | Status: DC | PRN
  Administered 2023-11-17: 100 ug/kg/min via INTRAVENOUS

## 2023-11-17 MED ORDER — PROPOFOL 10 MG/ML IV BOLUS
INTRAVENOUS | Status: DC | PRN
  Administered 2023-11-17: 40 mg via INTRAVENOUS
  Administered 2023-11-17: 30 mg via INTRAVENOUS
  Administered 2023-11-17: 20 mg via INTRAVENOUS

## 2023-11-17 MED ORDER — SODIUM CHLORIDE 0.9 % IV SOLN
INTRAVENOUS | Status: AC | PRN
  Administered 2023-11-17: 500 mL via INTRAVENOUS

## 2023-11-17 MED ORDER — PHENOL 1.4 % MT LIQD
1.0000 | OROMUCOSAL | Status: AC | PRN
Start: 2023-11-17 — End: 2023-11-17
  Administered 2023-11-17: 1 via OROMUCOSAL
  Filled 2023-11-17: qty 177

## 2023-11-17 MED ORDER — LIDOCAINE HCL (CARDIAC) PF 100 MG/5ML IV SOSY
PREFILLED_SYRINGE | INTRAVENOUS | Status: DC | PRN
  Administered 2023-11-17: 40 mg via INTRAVENOUS

## 2023-11-17 MED ORDER — SODIUM CHLORIDE 0.9 % IV SOLN: INTRAVENOUS | Status: DC | PRN

## 2023-11-17 NOTE — Plan of Care (Signed)

## 2023-11-17 NOTE — Discharge Summary (Signed)
 Physician Discharge Summary   Patient: Sarah Hendricks MRN: [id number] DOB: [birth year]  Admit date:     11/15/2023  Discharge date: 11/17/23  Discharge Physician: Deliliah Room   PCP: Geofm Glade PARAS, MD   Recommendations at discharge:    Follow up with PCP and GI Resume taking eliquis  on 7/4. Stop eliquis  and return to ED if you develop bleeding from any site including dark stools or red blood in stools.  Discharge Diagnoses: Principal Problem:   Anemia Active Problems:   CAD (coronary artery disease)   Hypertension   Atrial fibrillation (HCC)   Heme positive stool   Anticoagulated    Hospital Course:  78 y.o. female with past medical history of hx of paroxysmal atrial fibrillation,  osteoarthritis, CAD/MI s/p PCI, HTN, HLD breast cancer s/p lumpectomy and radiation, GERD and vitamin D  deficiency who presented with anemia and feeling weak and tired, found to have anemia   Acute blood loss anemia, POA:  Patient was having dark stool for the past few days.   Received one unit of PRBC on 7/3 before discharge Enteroscopy done on 7/2: Unremarkable Colonoscopy done on 7/3: Hemorrhoids, Diverticulosis Needs out patient capsule endoscopy. Held eliquis  in hospital but resumed on discharge Continue with oral iron supplementation.   Longstanding persistent atrial fibrillation, POA: Patient was started on Eliquis  back in January 2025.  She does follow-up with a cardiologist and her last appointment was almost 2 weeks back.  Eliquis  was held  in the hospital and resumed on discharge after talking to GI. Strictly instructed to stop eliquis  if she develops bleeding from any site.    CAD status post stenting,POA:  PCI was done in 2008.  Is not on aspirin or Plavix.  Continue with statin therapy.  Outpatient follow-up with cardiology.  No active cardiac issues.  Patient denies any chest pain.   Essential hypertension, POA: Stable   Heart failure with mildly reduced ejection fraction, POA:  NYHA class III .  Not in exacerbation.  Patient does take Lasix  at home 20 mg daily.  Continue to monitor closely.  Outpatient follow-up with cardiology.  Fluid restriction of 1.5 L/day, Daily weight and strict intake output monitoring. Echocardiogram done in January 2025 showed EF of 45 to 50% indicating mild reduced action fraction.     Consultants: GI Procedures performed: Enteroscopy, colonoscopy  Disposition: Home Diet recommendation:  Regular diet DISCHARGE MEDICATION: Allergies as of 11/17/2023       Reactions   Acetaminophen -codeine Other (See Comments)   Bad dreams   Tigan [trimethobenzamide] Other (See Comments)   Bad dreams   Alendronate    Caused leg pains    Atorvastatin  Other (See Comments)   Pt reports causes bilateral leg pains.   Codeine    Bad dreams   Simvastatin Other (See Comments)   Leg cramps        Medication List     STOP taking these medications    traMADol  50 MG tablet Commonly known as: ULTRAM        TAKE these medications    apixaban  5 MG Tabs tablet Commonly known as: ELIQUIS  Take 1 tablet (5 mg total) by mouth 2 (two) times daily. Start taking on: November 18, 2023   Cholecalciferol  25 MCG (1000 UT) tablet Commonly known as: EQL Vitamin D3 Take 1 tablet (1,000 Units total) by mouth daily.   escitalopram  10 MG tablet Commonly known as: LEXAPRO  TAKE 1 TABLET(10 MG) BY MOUTH DAILY   ferrous sulfate 325 (65 FE) MG  EC tablet Take 1 tablet (325 mg total) by mouth 2 (two) times daily.   furosemide  20 MG tablet Commonly known as: LASIX  Take one tablet 4 times per week What changed:  how much to take how to take this when to take this additional instructions   losartan  100 MG tablet Commonly known as: COZAAR  TAKE 1 TABLET(100 MG) BY MOUTH DAILY   pantoprazole 40 MG tablet Commonly known as: Protonix Take 1 tablet (40 mg total) by mouth daily.   propranolol  80 MG tablet Commonly known as: INDERAL  Take 1 tablet (80 mg total)  by mouth 2 (two) times daily.   rosuvastatin  10 MG tablet Commonly known as: CRESTOR  Take 1 tablet (10 mg total) by mouth daily. What changed: when to take this        Follow-up Information     Geofm Glade PARAS, MD Follow up in 1 week(s).   Specialty: Internal Medicine Contact information: [address] [place] [address] [phone]         Leigh Elspeth SQUIBB, MD. Schedule an appointment as soon as possible for a visit.   Specialty: Gastroenterology Contact information: [address] [place] [address] [place] [address] [phone]                Discharge Exam: Fredricka Weights   11/15/23 1225 11/16/23 0500  Weight: 68 kg 68 kg   Constitutional: NAD, calm, comfortable Eyes: PERRL, lids and conjunctivae normal ENMT: Mucous membranes are moist. Posterior pharynx clear of any exudate or lesions.Normal dentition.  Neck: normal, supple, no masses, no thyromegaly Respiratory: clear to auscultation bilaterally, no wheezing, no crackles. Normal respiratory effort. No accessory muscle use.  Cardiovascular: Regular rate and rhythm, no murmurs / rubs / gallops. No extremity edema. 2+ pedal pulses. No carotid bruits.  Abdomen: no tenderness, no masses palpated. No hepatosplenomegaly. Bowel sounds positive.  Musculoskeletal: no clubbing / cyanosis. No joint deformity upper and lower extremities. Good ROM, no contractures. Normal muscle tone.  Skin: no rashes, lesions, ulcers. No induration Neurologic: CN 2-12 grossly intact. Sensation intact, DTR normal. Strength 5/5 x all 4 extremities.  Psychiatric: Normal judgment and insight. Alert and oriented x 3. Normal mood.    Condition at discharge: good  The results of significant diagnostics from this hospitalization (including imaging, microbiology, ancillary and laboratory) are listed below for reference.   Imaging Studies: DG Chest 2 View Result Date: 11/15/2023 CLINICAL DATA:  SOB EXAM: CHEST - 2 VIEW COMPARISON:  November 14, 2023 FINDINGS: Biapical pleural thickening. Similar patchy opacities in the left lung base peripherally, corresponding to prior region of subpleural scarring. No new airspace consolidation, pleural effusion, or pneumothorax. Calcified mediastinal lymph nodes. No cardiomegaly. Tortuous aorta with aortic atherosclerosis. No acute fracture or destructive lesions. Multilevel thoracic osteophytosis. Surgical clips in the left breast. IMPRESSION: No acute cardiopulmonary abnormality. Electronically Signed   By: Rogelia Myers M.D.   On: 11/15/2023 13:18   DG Chest 2 View Result Date: 11/14/2023 CLINICAL DATA:  Dyspnea on exertion EXAM: CHEST - 2 VIEW COMPARISON:  None Available. FINDINGS: Normal mediastinum. Calcified mediastinal lymph nodes noted. Chronic bronchitic markings in lungs unchanged. Trace pleural effusions. No focal consolidation. No pneumothorax. Biopsy clips in the LEFT breast. IMPRESSION: 1. Chronic bronchitic markings. 2. Trace pleural effusions. Electronically Signed   By: Jackquline Boxer M.D.   On: 11/14/2023 17:30    Microbiology: Results for orders placed or performed during the hospital encounter of 07/03/23  Resp panel by RT-PCR (RSV,  Flu A&B, Covid) Anterior Nasal Swab     Status: None   Collection Time: 07/03/23  4:18 PM   Specimen: Anterior Nasal Swab  Result Value Ref Range Status   SARS Coronavirus 2 by RT PCR NEGATIVE NEGATIVE Final   Influenza A by PCR NEGATIVE NEGATIVE Final   Influenza B by PCR NEGATIVE NEGATIVE Final    Comment: (NOTE) The Xpert Xpress SARS-CoV-2/FLU/RSV plus assay is intended as an aid in the diagnosis of influenza from Nasopharyngeal swab specimens and should not be used as a sole basis for treatment. Nasal washings and aspirates are unacceptable for Xpert Xpress SARS-CoV-2/FLU/RSV testing.  Fact Sheet for Patients: BloggerCourse.com  Fact Sheet for Healthcare  Providers: SeriousBroker.it  This test is not yet approved or cleared by the [place]  FDA and has been authorized for detection and/or diagnosis of SARS-CoV-2 by FDA under an Emergency Use Authorization (EUA). This EUA will remain in effect (meaning this test can be used) for the duration of the COVID-19 declaration under Section 564(b)(1) of the Act, 21 U.S.C. section 360bbb-3(b)(1), unless the authorization is terminated or revoked.     Resp Syncytial Virus by PCR NEGATIVE NEGATIVE Final    Comment: (NOTE) Fact Sheet for Patients: BloggerCourse.com  Fact Sheet for Healthcare Providers: SeriousBroker.it  This test is not yet approved or cleared by the [place]  FDA and has been authorized for detection and/or diagnosis of SARS-CoV-2 by FDA under an Emergency Use Authorization (EUA). This EUA will remain in effect (meaning this test can be used) for the duration of the COVID-19 declaration under Section 564(b)(1) of the Act, 21 U.S.C. section 360bbb-3(b)(1), unless the authorization is terminated or revoked.  Performed at [facility], [address], [place], [place] [address]     Labs: CBC: Recent Labs  Lab 11/14/23 1655 11/15/23 1223 11/15/23 2001 11/16/23 1310 11/17/23 0852  WBC 8.1 8.4  --   --  6.0  NEUTROABS 4.9  --   --   --  3.8  HGB 7.2 Repeated and verified X2.* 7.2* 7.6* 8.2* 6.5*  HCT 23.9 Repeated and verified X2.* 25.5* 26.0* 24.0* 23.5*  MCV 69.4* 75.2*  --   --  75.3*  PLT 360.0 375  --   --  317   Basic Metabolic Panel: Recent Labs  Lab 11/14/23 1655 11/15/23 1223 11/16/23 1310  NA 133* 132* 133*  K 3.9 3.8 3.6  CL 96 96* 96*  CO2 29 26  --   GLUCOSE 95 121* 98  BUN 16 17 12   CREATININE 0.93 1.14* 0.90  CALCIUM  9.6 9.4  --    Liver Function Tests: Recent Labs  Lab 11/14/23 1655 11/15/23 1223  AST 20 40  ALT 13 15  ALKPHOS 74 66  BILITOT 0.6  1.2  PROT 6.9 6.6  ALBUMIN 4.1 3.5   CBG: No results for input(s): GLUCAP in the last 168 hours.  Discharge time spent: 45 minutes  Signed: Deliliah Room, MD [facility] 11/17/2023

## 2023-11-17 NOTE — Plan of Care (Signed)

## 2023-11-17 NOTE — Discharge Instructions (Signed)
 You can resume taking eliquis  on 7/4 Return to ED and stop taking eliquis  if you develop bleeding from any site including dark stools, blood in stools etc Call GI office to make an appointment. You need capsule endoscopy as an outpatient.

## 2023-11-17 NOTE — Anesthesia Postprocedure Evaluation (Signed)
 Anesthesia Post Note  Patient: Sarah Hendricks  Procedure(s) Performed: COLONOSCOPY     Patient location during evaluation: PACU Anesthesia Type: MAC Level of consciousness: awake and alert Pain management: pain level controlled Vital Signs Assessment: post-procedure vital signs reviewed and stable Respiratory status: spontaneous breathing, nonlabored ventilation and respiratory function stable Cardiovascular status: stable and blood pressure returned to baseline Postop Assessment: no apparent nausea or vomiting Anesthetic complications: no   No notable events documented.  Last Vitals:  Vitals:   11/17/23 0810 11/17/23 0820  BP: (!) 131/56 (!) 156/54  Pulse: 78 84  Resp: 17 14  Temp:    SpO2: 96% 96%    Last Pain:  Vitals:   11/17/23 0820  TempSrc:   PainSc: 0-No pain                 Sarafina Puthoff,W. EDMOND

## 2023-11-17 NOTE — Op Note (Signed)
 [facility] Patient Name: Sarah Hendricks Procedure Date : 11/17/2023 MRN: [id number] Attending MD: Elspeth SQUIBB. Leigh , MD, [id number] Date of Birth: Nov 16, 1945 CSN: [id number] Age: 78 Admit Type: Inpatient Procedure:                Colonoscopy Indications:              Heme positive stool, Iron deficiency anemia - on                            Eliquis  - held for 2 days. Negative enteroscopy Providers:                Elspeth P. Leigh, MD, Ozell Pouch, [facility]                            [place], Technician, Donald Vana BONINE Referring MD:              Medicines:                Monitored Anesthesia Care Complications:            No immediate complications. Estimated blood loss:                            None. Estimated Blood Loss:     Estimated blood loss: none. Procedure:                Pre-Anesthesia Assessment:                           - Prior to the procedure, a History and Physical                            was performed, and patient medications and                            allergies were reviewed. The patient's tolerance of                            previous anesthesia was also reviewed. The risks                            and benefits of the procedure and the sedation                            options and risks were discussed with the patient.                            All questions were answered, and informed consent                            was obtained. Prior Anticoagulants: The patient has                            taken Eliquis  (apixaban ), last dose was 2 days  prior to procedure. ASA Grade Assessment: III - A                            patient with severe systemic disease. After                            reviewing the risks and benefits, the patient was                            deemed in satisfactory condition to undergo the                            procedure.                           After obtaining  informed consent, the colonoscope                            was passed under direct vision. Throughout the                            procedure, the patient's blood pressure, pulse, and                            oxygen saturations were monitored continuously. The                            PCF-190TL ([id number]) Olympus colonoscope was                            introduced through the anus and advanced to the the                            terminal ileum, with identification of the                            appendiceal orifice and IC valve. The colonoscopy                            was performed without difficulty. The patient                            tolerated the procedure well. The quality of the                            bowel preparation was good. The terminal ileum,                            ileocecal valve, appendiceal orifice, and rectum                            were photographed. Scope In: 7:36:42 AM Scope Out: 7:53:17 AM Scope Withdrawal Time: 0 hours 10 minutes 31 seconds  Total Procedure Duration: 0 hours 16 minutes 35 seconds  Findings:      The perianal and digital rectal examinations were normal.      The terminal ileum appeared normal.      Multiple small-mouthed diverticula were found in the transverse colon       and left colon. This caused restriction in the sigmoid colon which was       slowly traversed during cecal intubation.      Internal hemorrhoids were found during retroflexion.      The exam was otherwise without abnormality. Impression:               - The examined portion of the ileum was normal.                           - Diverticulosis in the transverse colon and in the                            left colon.                           - Internal hemorrhoids.                           - The examination was otherwise normal.                           - No specimens collected.                           No cause for iron deficiency / heme positive  stools                            on colonoscopy and enteroscopy. Suspect small bowel                            source. She has had no overt bleeding. I think can                            go home with close monitoring and outpatient follow                            up for capsule endoscopy. Recommendation:           - Return patient to hospital ward for ongoing care.                           - Advance diet as tolerated.                           - Continue present medications.                           - Can resume Eliquis  today                           - Check Hgb today to ensure stable. Would give IV  iron if not already done so                           - Ferrous sulfate 325mg  / day as outpatient                           - I think can go home today with checking labs next                            week and coordination of capsule endoscopy as                            outpatient. Will discuss with her. Procedure Code(s):        --- Professional ---                           [phone], Colonoscopy, flexible; diagnostic, including                            collection of specimen(s) by brushing or washing,                            when performed (separate procedure) Diagnosis Code(s):        --- Professional ---                           K64.8, Other hemorrhoids                           R19.5, Other fecal abnormalities                           D50.9, Iron deficiency anemia, unspecified                           K57.30, Diverticulosis of large intestine without                            perforation or abscess without bleeding CPT copyright 2022 American Medical Association. All rights reserved. The codes documented in this report are preliminary and upon coder review may  be revised to meet current compliance requirements. Elspeth P. Nemiah Bubar, MD 11/17/2023 8:02:44 AM This report has been signed electronically. Number of Addenda: 0

## 2023-11-17 NOTE — Transfer of Care (Signed)
 Immediate Anesthesia Transfer of Care Note  Patient: Sarah Hendricks  Procedure(s) Performed: COLONOSCOPY  Patient Location: PACU and Endoscopy Unit  Anesthesia Type:MAC  Level of Consciousness: awake and drowsy  Airway & Oxygen Therapy: Patient Spontanous Breathing and Patient connected to face mask oxygen  Post-op Assessment: Report given to RN, Post -op Vital signs reviewed and stable, and Patient moving all extremities  Post vital signs: Reviewed and stable  Last Vitals:  Vitals Value Taken Time  BP    Temp    Pulse 74 11/17/23 07:59  Resp 19 11/17/23 07:59  SpO2 100 % 11/17/23 07:59  Vitals shown include unfiled device data.  Last Pain:  Vitals:   11/17/23 0710  TempSrc: Temporal  PainSc: 0-No pain         Complications: No notable events documented.

## 2023-11-17 NOTE — Interval H&P Note (Signed)
 History and Physical Interval Note: Enteroscopy negative for source of IDA - colonoscopy scheduled today. She did well with the prep. No bleeding. Have discussed risks / benefits, she agrees and understands and wants to proceed. Further recommendations pending the results.   11/17/2023 7:15 AM  Sarah Hendricks  has presented today for surgery, with the diagnosis of iron deficiency anemia.  The various methods of treatment have been discussed with the patient and family. After consideration of risks, benefits and other options for treatment, the patient has consented to  Procedure(s): COLONOSCOPY (N/A) as a surgical intervention.  The patient's history has been reviewed, patient examined, no change in status, stable for surgery.  I have reviewed the patient's chart and labs.  Questions were answered to the patient's satisfaction.     Elspeth P Ardel Jagger

## 2023-11-17 NOTE — Telephone Encounter (Signed)
-----   Message from Elspeth SHAUNNA Naval sent at 11/17/2023  8:03 AM EDT ----- Regarding: outpatient capsule and labs This is a former [facility] patient who I will now see. BEing discharged today. Needs a CBC ordered for next week so she can come to the lab and have that done, and a capsule endoscopy coordinate as outpatient for IDA, negative enteroscopy and colonoscopy. If you can call her next week to coordinate capsule at some point. Thanks SA

## 2023-11-17 NOTE — Telephone Encounter (Signed)
 Order in epic for lab next week. Will schedule capsule next week.

## 2023-11-17 NOTE — Progress Notes (Signed)
 Ben Frank Novelo D.CLEMENTEEN [facility] [address] [place] [address] Phone: [phone]   Assessment and Plan:     1. Primary osteoarthritis of right knee 2. Chronic pain of right knee  -Chronic with exacerbation, subsequent visit - Continued flare of right knee pain most consistent with flare of osteoarthritis.  Patient has had no relief with intra-articular CSI, Zilretta , HA injections - Use Tylenol  500 to 1000 mg tablets 2-3 times a day for day-to-day pain relief - Patient has had no improvement despite >6 weeks of conservative therapy, has pain with day-to-day activities, pain >6/10, so recommend further evaluation with right knee MRI - Do not recommend NSAID use or prednisone with past medical history of atrial fibrillation on current anticoagulation with Eliquis   Pertinent previous records reviewed include none  Follow Up: 1 week after MRI to review results and discuss treatment plan.  Could consider fitted knee brace versus GAE procedure versus referral to orthopedic surgery   Subjective:   I, [name], am serving as a Neurosurgeon for Doctor Morene Mace   Chief Complaint: multiple body parts    HPI:    12/20/22 Patient is a 78 year old female complaining of left ankle pain. Patient states she was getting out of bed and she fell last Sunday night pain in her left leg . Has bruising. Does have some shoulder pain but she states that is okay. Tylenol for the pain once a day. Pain does radiate up and down the leg but she is more afraid for a blood clot    01/11/2023 Patient states doing better, but right shoulder is still bothering her. Patient states the meloxicam not ure if it helped but it made her really dizzy.   02/24/2023 Patient states that her ankle is fine. Knee , shoulder and hip are flared    03/15/2023 Patient states right knee is doing terrible. Pain has been keeping her up at night . Meloxicam is no longer helping     11 /11/2022 Patient states   08/05/2023 Patient states  R knee,  R shoulder and L hip not good today. Nothing new to flare it up. Knee locked up the other day without cause.    08/19/2023 Patient states ready for shot    10/14/2023 Patient states she is ready for injection   11/21/2023 Patient states her pain has been awful since the injection. Has been in the hospital for a bleed    Relevant Historical Information: Hypertension, osteoporosis, prediabete  Additional pertinent review of systems negative.   Current Outpatient Medications:    apixaban  (ELIQUIS ) 5 MG TABS tablet, Take 1 tablet (5 mg total) by mouth 2 (two) times daily., Disp: 180 tablet, Rfl: 1   Cholecalciferol  (EQL VITAMIN D3) 1000 units tablet, Take 1 tablet (1,000 Units total) by mouth daily., Disp: , Rfl:    escitalopram  (LEXAPRO ) 10 MG tablet, TAKE 1 TABLET(10 MG) BY MOUTH DAILY, Disp: 90 tablet, Rfl: 1   ferrous sulfate  325 (65 FE) MG EC tablet, Take 1 tablet (325 mg total) by mouth 2 (two) times daily., Disp: 60 tablet, Rfl: 3   furosemide  (LASIX ) 20 MG tablet, Take one tablet 4 times per week (Patient taking differently: Take 20-40 mg by mouth See admin instructions. Take 40 mg by mouth for 3 days and then take 20 mg rest of time per patient), Disp: 48 tablet, Rfl: 3   losartan  (COZAAR ) 100 MG tablet, TAKE 1 TABLET(100 MG) BY MOUTH DAILY, Disp: 90 tablet,  Rfl: 3   pantoprazole  (PROTONIX ) 40 MG tablet, Take 1 tablet (40 mg total) by mouth daily., Disp: 10 tablet, Rfl: 1   propranolol  (INDERAL ) 80 MG tablet, Take 1 tablet (80 mg total) by mouth 2 (two) times daily., Disp: 180 tablet, Rfl: 3   rosuvastatin  (CRESTOR ) 10 MG tablet, Take 1 tablet (10 mg total) by mouth daily. (Patient taking differently: Take 10 mg by mouth every evening.), Disp: 90 tablet, Rfl: 3  Current Facility-Administered Medications:    denosumab  (PROLIA ) injection 60 mg, 60 mg, Subcutaneous, Once,    Objective:     Vitals:   11/21/23 0950  11/21/23 0951  Pulse:  60  SpO2:  93%  Weight:  149 lb (67.6 kg)  Height: 5' 3 (1.6 m) 5' 3 (1.6 m)      Body mass index is 26.39 kg/m.    Physical Exam:    General:  awake, alert oriented, no acute distress nontoxic Skin: no suspicious lesions or rashes Neuro:sensation intact and strength 5/5 with no deficits, no atrophy, normal muscle tone Psych: No signs of anxiety, depression or other mood disorder   Right knee: No swelling No deformity Neg fluid wave, joint milking ROM Flex 110, Ext 0 TTP medial joint line NTTP over the quad tendon, medial fem condyle, lat fem condyle, patella, plica, patella tendon, tibial tuberostiy, fibular head, posterior fossa, pes anserine bursa, gerdy's tubercle,  , lateral jt line Neg anterior and posterior drawer  Gait normal     Electronically signed by:  Odis Mace D.[facility] 10:14 AM 11/21/23

## 2023-11-18 ENCOUNTER — Encounter (HOSPITAL_COMMUNITY): Payer: Self-pay | Admitting: Gastroenterology

## 2023-11-18 LAB — BPAM RBC
Blood Product Expiration Date: 202508062359
ISSUE DATE / TIME: 202507031104
Unit Type and Rh: 5100

## 2023-11-18 LAB — TYPE AND SCREEN
ABO/RH(D): O POS
Antibody Screen: NEGATIVE
Unit division: 0

## 2023-11-19 ENCOUNTER — Encounter (HOSPITAL_COMMUNITY): Payer: Self-pay | Admitting: Gastroenterology

## 2023-11-21 ENCOUNTER — Telehealth: Payer: Self-pay | Admitting: *Deleted

## 2023-11-21 ENCOUNTER — Telehealth: Payer: Self-pay | Admitting: Gastroenterology

## 2023-11-21 ENCOUNTER — Ambulatory Visit: Admitting: Sports Medicine

## 2023-11-21 ENCOUNTER — Other Ambulatory Visit (INDEPENDENT_AMBULATORY_CARE_PROVIDER_SITE_OTHER)

## 2023-11-21 VITALS — HR 60 | Ht 63.0 in | Wt 149.0 lb

## 2023-11-21 DIAGNOSIS — G8929 Other chronic pain: Secondary | ICD-10-CM | POA: Diagnosis not present

## 2023-11-21 DIAGNOSIS — M1711 Unilateral primary osteoarthritis, right knee: Secondary | ICD-10-CM | POA: Diagnosis not present

## 2023-11-21 DIAGNOSIS — M25561 Pain in right knee: Secondary | ICD-10-CM | POA: Diagnosis not present

## 2023-11-21 DIAGNOSIS — D649 Anemia, unspecified: Secondary | ICD-10-CM | POA: Diagnosis not present

## 2023-11-21 DIAGNOSIS — D509 Iron deficiency anemia, unspecified: Secondary | ICD-10-CM

## 2023-11-21 LAB — CBC WITH DIFFERENTIAL/PLATELET
Basophils Absolute: 0.1 K/uL (ref 0.0–0.1)
Basophils Relative: 1.1 % (ref 0.0–3.0)
Eosinophils Absolute: 0.4 K/uL (ref 0.0–0.7)
Eosinophils Relative: 5.6 % — ABNORMAL HIGH (ref 0.0–5.0)
HCT: 27.1 % — ABNORMAL LOW (ref 36.0–46.0)
Hemoglobin: 8.3 g/dL — ABNORMAL LOW (ref 12.0–15.0)
Lymphocytes Relative: 20.5 % (ref 12.0–46.0)
Lymphs Abs: 1.4 K/uL (ref 0.7–4.0)
MCHC: 30.5 g/dL (ref 30.0–36.0)
MCV: 71.8 fl — ABNORMAL LOW (ref 78.0–100.0)
Monocytes Absolute: 0.7 K/uL (ref 0.1–1.0)
Monocytes Relative: 10.4 % (ref 3.0–12.0)
Neutro Abs: 4.3 K/uL (ref 1.4–7.7)
Neutrophils Relative %: 62.4 % (ref 43.0–77.0)
Platelets: 338 K/uL (ref 150.0–400.0)
RBC: 3.78 Mil/uL — ABNORMAL LOW (ref 3.87–5.11)
RDW: 20.9 % — ABNORMAL HIGH (ref 11.5–15.5)
WBC: 6.9 K/uL (ref 4.0–10.5)

## 2023-11-21 NOTE — Patient Instructions (Signed)
 Tylenol  [phone] mg 2-3 times a day for pain relief  MRI for R knee  Follow up 7 days after to discuss results

## 2023-11-21 NOTE — Telephone Encounter (Signed)
 I have spoken to patient to advise that Dr Leigh requested she come for labs this week to recheck hemoglobin. Orders in EPIC. Patient states she will come for these. Additionally, we have scheduled capsule endoscopy as requested by Dr Leigh. This is to be completed 12/14/23 at 830 am. Prep instructions have been made available via mychart. Patient verbalizes understanding of this information.

## 2023-11-21 NOTE — Transitions of Care (Post Inpatient/ED Visit) (Signed)
 11/21/2023  Name: Sarah Hendricks MRN: [id number] DOB: 1945/11/17  Today's TOC FU Call Status: Today's TOC FU Call Status:: Successful TOC FU Call Completed TOC FU Call Complete Date: 11/21/23 (notified of hospital discharge on 11/21/23) Patient's Name and Date of Birth confirmed.  Transition Care Management Follow-up Telephone Call Date of Discharge: 11/17/23 (notified of hospital discharge on 11/21/23) Discharge Facility: [facility]) Type of Discharge: Inpatient Admission Primary Inpatient Discharge Diagnosis:: anemia with colonoscopy/ 1 U PRBC's How have you been since you were released from the hospital?: Better (I am better now. I just have this slow bleeding that they can't find the source for, and my blood count got low.  I am back to my normal self now: driving, not having any issues.  I don't want to see anyone except Dr. Geofm, so I will just wait for her) Any questions or concerns?: No  Items Reviewed: Did you receive and understand the discharge instructions provided?: Yes (thoroughly reviewed with patient who verbalizes good understanding of same) Medications obtained,verified, and reconciled?: Yes (Medications Reviewed) (Full medication reconciliation/ review completed; no concerns or discrepancies identified; confirmed patient obtained/ is taking all newly Rx'd medications as instructed; self-manages medications and denies questions/ concerns around medications today) Any new allergies since your discharge?: No Dietary orders reviewed?: Yes Type of Diet Ordered:: Regular Do you have support at home?: Yes People in Home [RPT]: spouse Name of Support/Comfort Primary Source: Reports independent in self-care activities; resides with supportive spouse assists as/ if needed/ indicated  Medications Reviewed Today: Medications Reviewed Today     Reviewed by Aviel Davalos M, RN (Registered Nurse) on 11/21/23 at 1447  Med List Status: <None>   Medication Order Taking? Sig  Documenting Provider Last Dose Status Informant  apixaban  (ELIQUIS ) 5 MG TABS tablet [id number] Yes Take 1 tablet (5 mg total) by mouth 2 (two) times daily. Dino Antu, MD  Active   Cholecalciferol  (EQL VITAMIN D3) 1000 units tablet [id number] Yes Take 1 tablet (1,000 Units total) by mouth daily. Geofm Glade PARAS, MD  Active Self  denosumab  (PROLIA ) injection 60 mg [id number]   Geofm Glade PARAS, MD  Active   escitalopram  (LEXAPRO ) 10 MG tablet [id number] Yes TAKE 1 TABLET(10 MG) BY MOUTH DAILY Burns, Glade PARAS, MD  Active Self           Med Note KERRIN, MELISSA R   Thu Jul 07, 2023  4:01 PM)    ferrous sulfate  325 (65 FE) MG EC tablet [id number] Yes Take 1 tablet (325 mg total) by mouth 2 (two) times daily.  Patient taking differently: Take 325 mg by mouth 2 (two) times daily. 11/21/23: Reports during TOC call- she has heard from outpatient pharmacy- they are working to get this medication filled; she has not yet started taking: verbalizes plans to contact outpatient pharmacy after [facility] call to inquire- denies need for assistance   Rashid, Farhan, MD  Active   furosemide  (LASIX ) 20 MG tablet [id number] Yes Take one tablet 4 times per week Lonni Slain, MD  Active Self           Med Note (SATTERFIELD, DARIUS E   Tue Nov 15, 2023  5:31 PM) Patient states she has one more day to take the 40 mg tablet then she will take the 20 mg rest of time as of today (11-15-23)  losartan  (COZAAR ) 100 MG tablet [id number] Yes TAKE 1 TABLET(100 MG) BY MOUTH DAILY Hobart Powell BRAVO, MD  Active Self  pantoprazole  (PROTONIX ) 40  MG tablet [id number] Yes Take 1 tablet (40 mg total) by mouth daily. Rashid, Farhan, MD  Active   propranolol  (INDERAL ) 80 MG tablet [id number] Yes Take 1 tablet (80 mg total) by mouth 2 (two) times daily. Hobart Powell BRAVO, MD  Active Self  rosuvastatin  (CRESTOR ) 10 MG tablet [id number] Yes Take 1 tablet (10 mg total) by mouth daily. Hobart Powell BRAVO, MD  Active Self           Home  Care and Equipment/Supplies: Were Home Health Services Ordered?: No Any new equipment or medical supplies ordered?: No  Functional Questionnaire: Do you need assistance with bathing/showering or dressing?: No Do you need assistance with meal preparation?: No Do you need assistance with eating?: No Do you have difficulty maintaining continence: No Do you need assistance with getting out of bed/getting out of a chair/moving?: No Do you have difficulty managing or taking your medications?: No  Follow up appointments reviewed: PCP Follow-up appointment confirmed?: Yes Date of PCP follow-up appointment?: 12/05/23 (scheduled this far out due to patient's report that she does not want to see anyone except Dr. Geofm- declines sooner appointment with covering provider(s)) Follow-up Provider: PCP- Dr. Geofm Do you need transportation to your follow-up appointment?: No Do you understand care options if your condition(s) worsen?: Yes-patient verbalized understanding  SDOH Interventions Today    Flowsheet Row Most Recent Value  SDOH Interventions   Food Insecurity Interventions Intervention Not Indicated  Housing Interventions Intervention Not Indicated  Transportation Interventions Intervention Not Indicated  [drives self]  Utilities Interventions Intervention Not Indicated   Patient declines need for ongoing/ further care management/ coordination outreach; declines enrollment in 30-day TOC program- declines taking my direct phone number should needs/ concerns arise post-TOC call   See TOC assessment tabs for additional assessment/ TOC intervention information  Pls call/ message for questions,  Ardie Dragoo Mckinney Raneshia Derick, RN, BSN, CCRN Alumnus RN Care Manager  Transitions of Care  [facility] - [facility] [phone]: direct office

## 2023-11-21 NOTE — Telephone Encounter (Signed)
 Patient called and stated that she was seen by Dr. Leigh at the ED and was told to make an appointment with him for this week. Patient was advised Dr. Leigh did not have any availability until September and she stated that she needed to be seen this week due to her losing blood. Patient is requesting a call back from the nurse. Please advise.

## 2023-11-23 ENCOUNTER — Ambulatory Visit: Payer: Self-pay | Admitting: Gastroenterology

## 2023-11-23 NOTE — Telephone Encounter (Signed)
 Inbound call from patient requesting to speak with a nurse in regards to her passing blood. Please advise.   Thank you

## 2023-11-23 NOTE — Addendum Note (Signed)
 Addended by: CLAUDENE NAOMIE SAILOR on: 11/23/2023 02:41 PM   Modules accepted: Orders

## 2023-11-23 NOTE — Telephone Encounter (Signed)
 Thanks Dottie.  We will need to keep a close eye on her hemoglobin moving forward.  She just had repeat labs, hemoglobin was actually 8.3, slightly improved from previous.  I do think she would benefit from IV iron if you can refer her to the infusion center for IV iron (any formulation is okay with me if approved by insurance).  I would recommend repeating her CBC on Monday to make sure stable, continue oral iron for now and will await the results of her capsule endoscopy.  If she has overt blood in the stool or new changes she needs to let us  know in the interim.  Thanks

## 2023-11-23 NOTE — Telephone Encounter (Signed)
 Spoke to patient and gave Dr Hassan recommendations. She will come for hemoglobin recheck Monday (orders in epic) and will keep scheduled capsule endoscopy. Patient will also continue oral iron for now and is interested in IV iron infusions as well. Asked patient to contact us  for any overt bleeding or changes in symptoms. She verbalizes understanding.  Infusion Team-  Can you help us  figure out what IV iron formulation patient's insurance will cover? We will place infusion orders once we find out this info.

## 2023-11-23 NOTE — Telephone Encounter (Signed)
 Patient calls indicating that she is still passing black stool following hospitalization for symptomatic anemia. States these stools are loose/mushy (though she states this was occurring at time of hospitalization as well). She does say that she is taking a daily oral iron supplement (and denies any administration of IV iron during hospitalization as previously discussed). She is advised that oral iron itself can cause dark stools in some patients. Patient denies any symptoms of increased SOB, dizziness or increasing fatigue, though she does state that she has had some level of fatigue for quite sometime. She denies any abdominal pain with the exception of some very slight discomfort in LUQ that started a few days ago. Patient did restart Eliquis  several days ago.  Hgb done 11/21/23 was stable at 8.2 in comparison to 11/17/23 hemoglobin of 8.2.  Dr Leigh, please advise. Any additional recommendations? She is currently scheduled for capsule endoscopy on 12/14/23.

## 2023-11-24 ENCOUNTER — Telehealth: Payer: Self-pay | Admitting: Gastroenterology

## 2023-11-24 ENCOUNTER — Other Ambulatory Visit: Payer: Self-pay | Admitting: *Deleted

## 2023-11-24 NOTE — Telephone Encounter (Signed)
 Feraheme orders placed in infusion navigator set.

## 2023-11-24 NOTE — Telephone Encounter (Signed)
 Inbound call from patients husband stated wife is in a lot of pain and would like a call back from nurse.  Please advise  Thank you

## 2023-11-24 NOTE — Telephone Encounter (Signed)
 Patient calls back again today (spoke to patient yesterday) stating that she is having some really bad pains from my naval up. States that pain is dull but can occasionally be sharp, described as intermittent. She states that lying down seems to help the pain. She continues to denies any fever, increased SOB, dizziness or overt bleeding. Does complain of nausea. No vomiting.  Advised patient that IV iron has been ordered and she should hear from our infusion center team soon. Also advised that if she experiences worsening abdominal pain, SOB, dizziness, intractable nausea/vomiting, overt bleeding, she should report to the emergency room.   Dr Leigh, do you have any additional recommendations? She is already coming back for hemoglobin recheck 11/28/23 (just had stable hgb 8.3, previous hgb 7/3 was 8.2).

## 2023-11-24 NOTE — Telephone Encounter (Signed)
 Not sure exactly what is causing her pain. She did not have any pain when I last saw her in the hospital, which was a week ago. If having persistent symptoms, with her IDA, could do CT abdomen / pelvis with IV contrast to further evaluate. Can coordinate if she is agreeable, to be done ASAP. If she doesn't think she can wait and severe, will need to go to the ED as you recommended. Otherwise repeating her labs next week to make sure stable. thanks

## 2023-11-25 ENCOUNTER — Telehealth (HOSPITAL_COMMUNITY): Payer: Self-pay | Admitting: Pharmacy Technician

## 2023-11-25 NOTE — Telephone Encounter (Signed)
 Auth Submission: NO AUTH NEEDED Site of care: Site of care: [facility] Payer: [employer] MEDICARE Medication & CPT/J Code(s) submitted: Feraheme (ferumoxytol ) [id number] Diagnosis Code:  Route of submission (phone, fax, portal): phone, spoke to Waddell HERO at 12:37pm Phone # Fax # Auth type: Buy/Bill PB Units/visits requested: 510mg  x 2 doses Reference number: [id number] Approval from: 11/25/23 to 03/27/24  Dagoberto Armour, CPhT [facility] [phone]

## 2023-11-25 NOTE — Telephone Encounter (Signed)
 Left message to call back

## 2023-11-25 NOTE — Telephone Encounter (Signed)
 Spoke to patient who states that today, she actually feels much, much better and is having no pain. States she thinks maybe it was happening in relation to oral iron pills. Since patient is currently feeling well, we will hold off on additional evaluation and stick to plan of capsule endoscopy and hgb redraw on Monday.

## 2023-11-26 ENCOUNTER — Ambulatory Visit
Admission: RE | Admit: 2023-11-26 | Discharge: 2023-11-26 | Disposition: A | Discharge status: Home / Self Care | Source: Ambulatory Visit | Attending: Sports Medicine | Admitting: Sports Medicine

## 2023-11-26 DIAGNOSIS — G8929 Other chronic pain: Secondary | ICD-10-CM

## 2023-11-26 DIAGNOSIS — M1711 Unilateral primary osteoarthritis, right knee: Secondary | ICD-10-CM

## 2023-11-28 ENCOUNTER — Ambulatory Visit: Payer: Self-pay | Admitting: Gastroenterology

## 2023-11-28 ENCOUNTER — Other Ambulatory Visit (INDEPENDENT_AMBULATORY_CARE_PROVIDER_SITE_OTHER)

## 2023-11-28 ENCOUNTER — Ambulatory Visit
Admission: RE | Admit: 2023-11-28 | Discharge: 2023-11-28 | Disposition: A | Discharge status: Home / Self Care | Source: Ambulatory Visit | Attending: Internal Medicine | Admitting: Internal Medicine

## 2023-11-28 ENCOUNTER — Ambulatory Visit (INDEPENDENT_AMBULATORY_CARE_PROVIDER_SITE_OTHER)

## 2023-11-28 VITALS — BP 133/76 | HR 68 | Temp 97.9°F | Resp 16 | Ht 63.0 in | Wt 150.6 lb

## 2023-11-28 DIAGNOSIS — Z1231 Encounter for screening mammogram for malignant neoplasm of breast: Secondary | ICD-10-CM

## 2023-11-28 DIAGNOSIS — D500 Iron deficiency anemia secondary to blood loss (chronic): Secondary | ICD-10-CM

## 2023-11-28 DIAGNOSIS — D509 Iron deficiency anemia, unspecified: Secondary | ICD-10-CM | POA: Diagnosis not present

## 2023-11-28 LAB — HEMOGLOBIN: Hemoglobin: 8.9 g/dL — ABNORMAL LOW (ref 12.0–15.0)

## 2023-11-28 MED ORDER — SODIUM CHLORIDE 0.9 % IV SOLN
510.0000 mg | Freq: Once | INTRAVENOUS | Status: AC
Start: 2023-11-28 — End: 2023-11-28
  Administered 2023-11-28: 510 mg via INTRAVENOUS
  Filled 2023-11-28: qty 17

## 2023-11-28 NOTE — Progress Notes (Signed)
 Diagnosis: Acute Anemia  Provider:  Lonna Coder MD  Procedure: IV Infusion  IV Type: Peripheral, IV Location: R Forearm  Feraheme (Ferumoxytol ), Dose: 510 mg  Infusion Start Time: 1533  Infusion Stop Time: 1553  Post Infusion IV Care: Observation period completed and Peripheral IV Discontinued  Discharge: Condition: Good, Destination: Home . AVS Declined  Performed by:  Donny Childes, RN

## 2023-11-29 ENCOUNTER — Other Ambulatory Visit

## 2023-12-01 ENCOUNTER — Encounter: Payer: Self-pay | Admitting: Internal Medicine

## 2023-12-01 NOTE — Progress Notes (Signed)
 Subjective:    Patient ID: Sarah Hendricks, female    DOB: 09/13/1945, 78 y.o.   MRN: [id number]     HPI Sarah Hendricks is here for follow up from the hospital   Admitted 7/1 - 7/3 for symptomatic anemia, GIB   Presented to ED with anemia, fatigue and SOB.    Acute blood loss anemia -  Darks stools x few days S/p 1 unit PRBCs on 7/3 Eliquis  held Enteroscopy 7/2 - negative Colonscopy 7/3 - hemorrhoids, diverticulosis Needs outpt capsule endoscopy Consider oral iron Eliquis  resumed on discharge  Longstanding persistant Afib -  Started on eliquis  05/2023 Eliquis  held - resumed at discharge  CAD s/p stenting -  PCI in 2008 On statin Following with cardiology  Htn  -  Stable  HFrEF -  No exacerbation Fluid restriction 1.5 L / day Continued Lasix  20 mg daily  Daily weight Echo 2025 - EF 45-50%  Had iron infusion 7/14 - another one tomorrow, has capsule endoscopy scheduled for 7/30  She is taking iron tablets daily  She is still passing loose black stools.  She had her hemoglobin checked last week and it had improved and she is scheduled to have it rechecked again next week.  She is unsure if the dark stools are related to the iron or if she is still bleeding.  Overall feeling better-denies shortness of breath, lightheadedness.  Fatigue has improved  Having gerd - depends on what she eats.  She was discharged home on pantoprazole  40 mg daily and she did take that until it ran out and she says it significantly helped her heartburn.  She typically only has heartburn depending on what she eats.  She wondered if she can have it refilled.  Still having abdominal pain.  She does not recall when the abdominal pain started.  She did not have abdominal pain prior to the hospital-no mention of abdominal pain while in the hospital.      Medications and allergies reviewed with patient and updated if appropriate.  Current Outpatient Medications on File Prior to Visit   Medication Sig Dispense Refill   apixaban  (ELIQUIS ) 5 MG TABS tablet Take 1 tablet (5 mg total) by mouth 2 (two) times daily. 180 tablet 1   Cholecalciferol  (EQL VITAMIN D3) 1000 units tablet Take 1 tablet (1,000 Units total) by mouth daily.     escitalopram  (LEXAPRO ) 10 MG tablet TAKE 1 TABLET(10 MG) BY MOUTH DAILY 90 tablet 1   ferrous sulfate  325 (65 FE) MG EC tablet Take 1 tablet (325 mg total) by mouth 2 (two) times daily. (Patient taking differently: Take 325 mg by mouth 2 (two) times daily. 11/21/23: Reports during TOC call- she has heard from outpatient pharmacy- they are working to get this medication filled; she has not yet started taking: verbalizes plans to contact outpatient pharmacy after [facility] call to inquire- denies need for assistance) 60 tablet 3   furosemide  (LASIX ) 20 MG tablet Take one tablet 4 times per week 48 tablet 3   losartan  (COZAAR ) 100 MG tablet TAKE 1 TABLET(100 MG) BY MOUTH DAILY 90 tablet 3   pantoprazole  (PROTONIX ) 40 MG tablet Take 1 tablet (40 mg total) by mouth daily. 10 tablet 1   propranolol  (INDERAL ) 80 MG tablet Take 1 tablet (80 mg total) by mouth 2 (two) times daily. 180 tablet 3   rosuvastatin  (CRESTOR ) 10 MG tablet Take 1 tablet (10 mg total) by mouth daily. 90 tablet 3   Current Facility-Administered  Medications on File Prior to Visit  Medication Dose Route Frequency Provider Last Rate Last Admin   denosumab  (PROLIA ) injection 60 mg  60 mg Subcutaneous Once          Review of Systems  Constitutional:  Positive for fatigue. Negative for appetite change (good appetite) and fever.  Respiratory:  Negative for cough, shortness of breath and wheezing.   Cardiovascular:  Positive for leg swelling (right ankle from fracture). Negative for chest pain and palpitations.  Gastrointestinal:  Positive for abdominal pain (very little - epigastric). Negative for blood in stool (black stools), constipation, diarrhea (loose stools) and nausea.       Gerd   Neurological:  Positive for headaches. Negative for dizziness and light-headedness.       Objective:   Vitals:   12/05/23 1113  BP: 132/74  Pulse: 60  Temp: 98 F (36.7 C)  SpO2: 97%   BP Readings from Last 3 Encounters:  12/05/23 132/74  11/28/23 133/76  11/17/23 (!) 146/52   Wt Readings from Last 3 Encounters:  12/05/23 150 lb (68 kg)  11/28/23 150 lb 9.6 oz (68.3 kg)  11/21/23 149 lb (67.6 kg)   Body mass index is 26.57 kg/m.    Physical Exam Constitutional:      General: She is not in acute distress.    Appearance: Normal appearance.  HENT:     Head: Normocephalic and atraumatic.  Eyes:     Conjunctiva/sclera: Conjunctivae normal.  Cardiovascular:     Rate and Rhythm: Normal rate and regular rhythm.     Heart sounds: Normal heart sounds.  Pulmonary:     Effort: Pulmonary effort is normal. No respiratory distress.     Breath sounds: Normal breath sounds. No wheezing.  Abdominal:     General: There is no distension.     Palpations: Abdomen is soft. There is no mass.     Tenderness: There is abdominal tenderness (Minimal tenderness periumbilical region, right mid quadrant, across lower abdomen). There is no guarding or rebound.  Musculoskeletal:     Cervical back: Neck supple.     Right lower leg: No edema.     Left lower leg: No edema.  Lymphadenopathy:     Cervical: No cervical adenopathy.  Skin:    General: Skin is warm and dry.     Findings: No rash.  Neurological:     Mental Status: She is alert. Mental status is at baseline.  Psychiatric:        Mood and Affect: Mood normal.        Behavior: Behavior normal.        Lab Results  Component Value Date   WBC 6.9 11/21/2023   HGB 8.9 Repeated and verified X2. (L) 11/28/2023   HCT 27.1 (L) 11/21/2023   PLT 338.0 11/21/2023   GLUCOSE 98 11/16/2023   CHOL 137 01/28/2023   TRIG 162.0 (H) 01/28/2023   HDL 50.60 01/28/2023   LDLDIRECT 87.0 06/12/2018   LDLCALC 54 01/28/2023   ALT 15 11/15/2023    AST 40 11/15/2023   NA 133 (L) 11/16/2023   K 3.6 11/16/2023   CL 96 (L) 11/16/2023   CREATININE 0.90 11/16/2023   BUN 12 11/16/2023   CO2 26 11/15/2023   TSH 1.262 05/30/2023   INR 1.3 (H) 11/15/2023   HGBA1C 5.4 11/15/2023    Lab Results  Component Value Date   HGB 8.9 Repeated and verified X2. (L) 11/28/2023     Assessment & Plan:  See Problem List for Assessment and Plan of chronic medical problems.

## 2023-12-01 NOTE — Patient Instructions (Addendum)
      Medications changes include :   pantoprazole  20 mg daily as needed for heartburn  v  Let me know if your abdominal discomfort does not resolve or gets worse.      Return for follow up as scheduled.

## 2023-12-05 ENCOUNTER — Ambulatory Visit (INDEPENDENT_AMBULATORY_CARE_PROVIDER_SITE_OTHER): Admitting: Internal Medicine

## 2023-12-05 VITALS — BP 132/74 | HR 60 | Temp 98.0°F | Ht 63.0 in | Wt 150.0 lb

## 2023-12-05 DIAGNOSIS — I10 Essential (primary) hypertension: Secondary | ICD-10-CM

## 2023-12-05 DIAGNOSIS — I4891 Unspecified atrial fibrillation: Secondary | ICD-10-CM | POA: Diagnosis not present

## 2023-12-05 DIAGNOSIS — I2510 Atherosclerotic heart disease of native coronary artery without angina pectoris: Secondary | ICD-10-CM

## 2023-12-05 DIAGNOSIS — D500 Iron deficiency anemia secondary to blood loss (chronic): Secondary | ICD-10-CM | POA: Diagnosis not present

## 2023-12-05 DIAGNOSIS — K219 Gastro-esophageal reflux disease without esophagitis: Secondary | ICD-10-CM

## 2023-12-05 DIAGNOSIS — R109 Unspecified abdominal pain: Secondary | ICD-10-CM | POA: Insufficient documentation

## 2023-12-05 MED ORDER — PANTOPRAZOLE SODIUM 20 MG PO TBEC
20.0000 mg | DELAYED_RELEASE_TABLET | Freq: Every day | ORAL | 1 refills | Status: AC | PRN
Start: 2023-12-05 — End: ?

## 2023-12-05 NOTE — Assessment & Plan Note (Signed)
 New She is unsure when this abdominal pain started, but she did not have it prior to her hospitalization and it was not mentioned in the admission or discharge of her hospitalization ?  Related to iron supplementation She stopped iron supplementation tomorrow and will monitor her abdominal pain closely-if it does not resolve or worsen she will let me know Already had colonoscopy and endoscopy and scheduled for capsule small bowel endoscopy this month

## 2023-12-05 NOTE — Assessment & Plan Note (Signed)
 Chronic Blood pressure well-controlled Continue losartan  100 mg daily, propranolol  80 mg twice daily

## 2023-12-05 NOTE — Assessment & Plan Note (Signed)
 Chronic Following with cardiology s/p cardioversion On Eliquis  5 mg twice daily and propranolol  80 mg twice daily Recent GI bleed likely cause of acute anemia Small bowel capsule endoscopy scheduled for later this month, iron infusion tomorrow CBC to be rechecked next week

## 2023-12-05 NOTE — Assessment & Plan Note (Signed)
 Acute-related to GI bleed while on Eliquis  Colonoscopy, endoscopy negative for source Small bowel capsule endoscopy scheduled for the end of this month Blood counts have improved S/p 1 iron infusion-second tomorrow Did receive blood transfusion while in hospital Blood counts improving Having black stool which is likely related to iron-to have repeat blood counts done next week Stops iron supplementation tomorrow for 1 week

## 2023-12-05 NOTE — Assessment & Plan Note (Signed)
 Chronic History of stent following MI in 2008 while living in [place]  Continue aspirin 81 mg daily, Crestor  10 mg daily Following with cardiology

## 2023-12-05 NOTE — Assessment & Plan Note (Signed)
 Chronic Intermittent-typically based on what she eats Encouraged GERD diet Will prescribe pantoprazole  20 mg daily as needed-encouraged her only to take this as needed because of potential long-term side effects

## 2023-12-06 ENCOUNTER — Ambulatory Visit (INDEPENDENT_AMBULATORY_CARE_PROVIDER_SITE_OTHER)

## 2023-12-06 VITALS — BP 134/61 | HR 57 | Temp 98.1°F | Resp 118 | Ht 63.0 in | Wt 150.0 lb

## 2023-12-06 DIAGNOSIS — K922 Gastrointestinal hemorrhage, unspecified: Secondary | ICD-10-CM

## 2023-12-06 DIAGNOSIS — D500 Iron deficiency anemia secondary to blood loss (chronic): Secondary | ICD-10-CM

## 2023-12-06 MED ORDER — SODIUM CHLORIDE 0.9 % IV SOLN
510.0000 mg | Freq: Once | INTRAVENOUS | Status: AC
  Administered 2023-12-06: 510 mg via INTRAVENOUS
  Filled 2023-12-06: qty 17

## 2023-12-06 NOTE — Progress Notes (Unsigned)
 Ben Jackson D.[facility] [address] [place] [address] Phone: [phone]   Assessment and Plan:     There are no diagnoses linked to this encounter.  ***   Pertinent previous records reviewed include ***    Follow Up: ***     Subjective:   I, Chenille Toor, am serving as a Neurosurgeon for Doctor Morene Mace   Chief Complaint: multiple body parts    HPI:    12/20/22 Patient is a 78 year old female complaining of left ankle pain. Patient states she was getting out of bed and she fell last Sunday night pain in her left leg . Has bruising. Does have some shoulder pain but she states that is okay. Tylenol  for the pain once a day. Pain does radiate up and down the leg but she is more afraid for a blood clot    01/11/2023 Patient states doing better, but right shoulder is still bothering her. Patient states the meloxicam  not ure if it helped but it made her really dizzy.   02/24/2023 Patient states that her ankle is fine. Knee , shoulder and hip are flared    03/15/2023 Patient states right knee is doing terrible. Pain has been keeping her up at night . Meloxicam  is no longer helping    03/24/2023 Patient states   08/05/2023 Patient states  R knee,  R shoulder and L hip not good today. Nothing new to flare it up. Knee locked up the other day without cause.    08/19/2023 Patient states ready for shot    10/14/2023 Patient states she is ready for injection    11/21/2023 Patient states her pain has been awful since the injection. Has been in the hospital for a bleed   12/07/2023 Patient states   Relevant Historical Information: Hypertension, osteoporosis, prediabete  Additional pertinent review of systems negative.   Current Outpatient Medications:    apixaban  (ELIQUIS ) 5 MG TABS tablet, Take 1 tablet (5 mg total) by mouth 2 (two) times daily., Disp: 180 tablet, Rfl: 1   Cholecalciferol  (EQL VITAMIN D3) 1000 units tablet,  Take 1 tablet (1,000 Units total) by mouth daily., Disp: , Rfl:    escitalopram  (LEXAPRO ) 10 MG tablet, TAKE 1 TABLET(10 MG) BY MOUTH DAILY, Disp: 90 tablet, Rfl: 1   ferrous sulfate  325 (65 FE) MG EC tablet, Take 1 tablet (325 mg total) by mouth 2 (two) times daily. (Patient taking differently: Take 325 mg by mouth 2 (two) times daily. 11/21/23: Reports during TOC call- she has heard from outpatient pharmacy- they are working to get this medication filled; she has not yet started taking: verbalizes plans to contact outpatient pharmacy after [facility] call to inquire- denies need for assistance), Disp: 60 tablet, Rfl: 3   furosemide  (LASIX ) 20 MG tablet, Take one tablet 4 times per week, Disp: 48 tablet, Rfl: 3   losartan  (COZAAR ) 100 MG tablet, TAKE 1 TABLET(100 MG) BY MOUTH DAILY, Disp: 90 tablet, Rfl: 3   pantoprazole  (PROTONIX ) 20 MG tablet, Take 1 tablet (20 mg total) by mouth daily as needed for heartburn., Disp: 90 tablet, Rfl: 1   propranolol  (INDERAL ) 80 MG tablet, Take 1 tablet (80 mg total) by mouth 2 (two) times daily., Disp: 180 tablet, Rfl: 3   rosuvastatin  (CRESTOR ) 10 MG tablet, Take 1 tablet (10 mg total) by mouth daily., Disp: 90 tablet, Rfl: 3  Current Facility-Administered Medications:    denosumab  (PROLIA ) injection 60 mg, 60 mg, Subcutaneous,  Once,    Objective:     There were no vitals filed for this visit.    There is no height or weight on file to calculate BMI.    Physical Exam:    ***   Electronically signed by:  Odis Mace D[facility] 9:53 AM 12/06/23

## 2023-12-06 NOTE — Progress Notes (Signed)
 Diagnosis: Acute Anemia  Provider:  Lonna Coder MD  Procedure: IV Infusion  IV Type: Peripheral, IV Location: R Forearm  Feraheme (Ferumoxytol ), Dose: 510 mg  Infusion Start Time: 0845  Infusion Stop Time: 0905  Post Infusion IV Care: Patient declined observation and Peripheral IV Discontinued  Discharge: Condition: Good, Destination: Home . AVS Declined  Performed by:  Donny Childes, RN

## 2023-12-07 ENCOUNTER — Ambulatory Visit: Admitting: Sports Medicine

## 2023-12-07 VITALS — HR 67 | Ht 63.0 in | Wt 150.0 lb

## 2023-12-07 DIAGNOSIS — G8929 Other chronic pain: Secondary | ICD-10-CM | POA: Diagnosis not present

## 2023-12-07 DIAGNOSIS — M25561 Pain in right knee: Secondary | ICD-10-CM

## 2023-12-07 DIAGNOSIS — M1711 Unilateral primary osteoarthritis, right knee: Secondary | ICD-10-CM | POA: Diagnosis not present

## 2023-12-07 NOTE — Patient Instructions (Signed)
 Ortho referral  Can call if you would like a knee brace or a artery procedure

## 2023-12-12 ENCOUNTER — Telehealth: Payer: Self-pay

## 2023-12-12 DIAGNOSIS — D509 Iron deficiency anemia, unspecified: Secondary | ICD-10-CM

## 2023-12-12 DIAGNOSIS — R935 Abnormal findings on diagnostic imaging of other abdominal regions, including retroperitoneum: Secondary | ICD-10-CM

## 2023-12-12 NOTE — Telephone Encounter (Signed)
 MyChart message to patient to go to the lab to recheck hemoglobin in the next couple of days

## 2023-12-12 NOTE — Telephone Encounter (Signed)
-----   Message from [facility] sent at 11/28/2023  4:56 PM EDT ----- Regarding: due for Hgb Patient due for hgb this week

## 2023-12-13 ENCOUNTER — Ambulatory Visit: Payer: Self-pay | Admitting: Gastroenterology

## 2023-12-13 ENCOUNTER — Other Ambulatory Visit (INDEPENDENT_AMBULATORY_CARE_PROVIDER_SITE_OTHER)

## 2023-12-13 ENCOUNTER — Other Ambulatory Visit: Payer: Self-pay

## 2023-12-13 DIAGNOSIS — D509 Iron deficiency anemia, unspecified: Secondary | ICD-10-CM | POA: Diagnosis not present

## 2023-12-13 LAB — HEMOGLOBIN: Hemoglobin: 10.3 g/dL — ABNORMAL LOW (ref 12.0–15.0)

## 2023-12-14 ENCOUNTER — Ambulatory Visit (INDEPENDENT_AMBULATORY_CARE_PROVIDER_SITE_OTHER): Admitting: Gastroenterology

## 2023-12-14 DIAGNOSIS — Z7901 Long term (current) use of anticoagulants: Secondary | ICD-10-CM | POA: Diagnosis not present

## 2023-12-14 DIAGNOSIS — K315 Obstruction of duodenum: Secondary | ICD-10-CM | POA: Diagnosis not present

## 2023-12-14 DIAGNOSIS — D509 Iron deficiency anemia, unspecified: Secondary | ICD-10-CM | POA: Diagnosis not present

## 2023-12-14 NOTE — Patient Instructions (Signed)
POST CAPSULE INSTRUCTIONS:  Contact our office immediately at [phone] if you suffer from any abdominal pain, nausea, or vomiting during capsule endoscopy. Do not eat or drink for at least 2 hours. After 2 hours you may have any of the following to drink: Water   White grape juice 7-Up   Chicken Bouillon Sprite   Ginger Ale After 4 hours you may have a light snack to include any of the following: A cup of soup   sandwich Bowl of cereal  Rice Toast   Eggs 2-3 small cookies (i.e. vanilla wafers or graham crackers) After 8 hours you may return to your regular diet. During your procedure do not go near anyone else that is having capsule endoscopy. Do not be in close contact with an MRI machine or a radio or television tower. Do not wear a heavy coat or sweater because your recorder may over heat and stop recording.   Do not disconnect the equipment or remove the belt at any time.  Since the Data Recorder is actually a small computer, it should be treated with utmost care and protection.  Avoid sudden movement and banging of the Data Recorder.  Do not do any heavy lifting or strenuous physical activity during the test especially if it involves sweating and do not bend over or stoop during capsule endoscopy. During capsule endoscopy, you will need to verify every 15 minutes that the small light on top of the Data Recorder is blinking twice per second.  If for some reason it stops blinking at this site, record the time and contact our office at [phone].  

## 2023-12-14 NOTE — Progress Notes (Signed)
 SN: [id number] D Exp: 2024-07-27 LOT: [id number] Patient arrived for Capsule Endoscopy. Reported the prep went well. This nurse explained dietary restrictions for the next few hours. Patient verbalized understanding. Opened capsule, ensured capsule was flashing prior to the patient swallowing the capsule. Patient swallowed capsule without difficulty. Patient instructed to return to the office at 4:00 pm today for removal of the recording equipment, to call the office with any questions and if no capsule was visualized after 72 hours. No further questions by the conclusion of the visit.

## 2023-12-16 ENCOUNTER — Ambulatory Visit (HOSPITAL_BASED_OUTPATIENT_CLINIC_OR_DEPARTMENT_OTHER)

## 2023-12-16 ENCOUNTER — Ambulatory Visit (HOSPITAL_BASED_OUTPATIENT_CLINIC_OR_DEPARTMENT_OTHER): Admitting: Orthopaedic Surgery

## 2023-12-16 DIAGNOSIS — S82841D Displaced bimalleolar fracture of right lower leg, subsequent encounter for closed fracture with routine healing: Secondary | ICD-10-CM | POA: Diagnosis not present

## 2023-12-16 DIAGNOSIS — M79604 Pain in right leg: Secondary | ICD-10-CM | POA: Diagnosis not present

## 2023-12-16 DIAGNOSIS — S82841A Displaced bimalleolar fracture of right lower leg, initial encounter for closed fracture: Secondary | ICD-10-CM | POA: Diagnosis not present

## 2023-12-16 DIAGNOSIS — M7731 Calcaneal spur, right foot: Secondary | ICD-10-CM | POA: Diagnosis not present

## 2023-12-16 MED ORDER — TRIAMCINOLONE ACETONIDE 40 MG/ML IJ SUSP
80.0000 mg | INTRAMUSCULAR | Status: AC | PRN
  Administered 2023-12-16: 80 mg via INTRA_ARTICULAR

## 2023-12-16 MED ORDER — LIDOCAINE HCL 1 % IJ SOLN
4.0000 mL | INTRAMUSCULAR | Status: AC | PRN
  Administered 2023-12-16: 4 mL

## 2023-12-16 NOTE — Progress Notes (Signed)
 Post Operative Evaluation    Procedure/Date of Surgery: Right ankle open reduction internal fixation 2/3  Interval History:   Presents today status post the above procedure.  She is still having some persistent swelling about the ankle although this is slowly improving with time.  PMH/PSH/Family History/Social History/Meds/Allergies:    Past Medical History:  Diagnosis Date   Allergy    Anxiety    Arthritis    Bimalleolar ankle fracture, right, closed, initial encounter    Breast cancer (HCC) 1994   lumpectomy and XRT, no chemo   CAD (coronary artery disease) 2008   PTCA, in [place]   GERD (gastroesophageal reflux disease)    History of chicken pox    Hyperlipidemia    Hypertension    Myocardial infarction (HCC) 03/15/2007   stent placed   Vitamin D  deficiency    Past Surgical History:  Procedure Laterality Date   BREAST BIOPSY Left 1994   BREAST LUMPECTOMY Left 1994   CARDIOVERSION N/A 07/15/2023   Procedure: CARDIOVERSION;  Surgeon: Loni Soyla LABOR, MD;  Location: [facility];  Service: Cardiovascular;  Laterality: N/A;   CATARACT EXTRACTION, BILATERAL     COLONOSCOPY N/A 11/17/2023   Procedure: COLONOSCOPY;  Surgeon: Leigh Elspeth SQUIBB, MD;  Location: [facility] ENDOSCOPY;  Service: Gastroenterology;  Laterality: N/A;   CORONARY ANGIOPLASTY WITH STENT PLACEMENT  2008   CRANIECTOMY FOR DEPRESSED SKULL FRACTURE  1986   ENTEROSCOPY N/A 11/16/2023   Procedure: ENTEROSCOPY;  Surgeon: Leigh Elspeth SQUIBB, MD;  Location: [facility] ENDOSCOPY;  Service: Gastroenterology;  Laterality: N/A;   ORIF ANKLE FRACTURE Right 06/20/2023   Procedure: RIGHT OPEN REDUCTION INTERNAL FIXATION (ORIF) ANKLE FRACTURE;  Surgeon: Genelle Elspeth, MD;  Location: [facility];  Service: Orthopedics;  Laterality: Right;   TONSILLECTOMY  1966   Social History   Socioeconomic History   Marital status: Married    Spouse name: Not on file   Number of children:  Not on file   Years of education: Not on file   Highest education level: Some college, no degree  Occupational History   Not on file  Tobacco Use   Smoking status: Never    Passive exposure: Never   Smokeless tobacco: Never  Vaping Use   Vaping status: Never Used  Substance and Sexual Activity   Alcohol use: No   Drug use: No   Sexual activity: Not Currently    Birth control/protection: Post-menopausal  Other Topics Concern   Not on file  Social History Narrative   Married, lives with spouse   Retired from [employer] irregularly, rides an exercise bike   Social Drivers of Health   Financial Resource Strain: Low Risk  (09/27/2023)   Overall Financial Resource Strain (CARDIA)    Difficulty of Paying Living Expenses: Not hard at all  Food Insecurity: No Food Insecurity (11/21/2023)   Hunger Vital Sign    Worried About Running Out of Food in the Last Year: Never true    Ran Out of Food in the Last Year: Never true  Transportation Needs: No Transportation Needs (11/21/2023)   PRAPARE - Administrator, Civil Service (Medical): No    Lack of Transportation (Non-Medical): No  Physical Activity: Insufficiently Active (09/27/2023)   Exercise Vital Sign    Days  of Exercise per Week: 3 days    Minutes of Exercise per Session: 20 min  Stress: No Stress Concern Present (09/27/2023)   [employer] of Occupational Health - Occupational Stress Questionnaire    Feeling of Stress : Not at all  Social Connections: Moderately Integrated (11/15/2023)   Social Connection and Isolation Panel    Frequency of Communication with Friends and Family: More than three times a week    Frequency of Social Gatherings with Friends and Family: Twice a week    Attends Religious Services: More than 4 times per year    Active Member of [employer] or Organizations: No    Attends Engineer, structural: Never    Marital Status: Married   Family History  Problem Relation Age of  Onset   Breast cancer Mother    Hypertension Mother    Heart disease Mother    Stroke Mother    Arthritis Father    Heart disease Father        died at 73 of heart attack   Heart disease Brother    Stroke Maternal Grandmother    Heart disease Maternal Grandfather    Colon cancer Neg Hx    Esophageal cancer Neg Hx    Rectal cancer Neg Hx    Stomach cancer Neg Hx    Allergies  Allergen Reactions   Acetaminophen -Codeine Other (See Comments)    Bad dreams   Tigan [Trimethobenzamide] Other (See Comments)    Bad dreams   Alendronate     Caused leg pains    Atorvastatin  Other (See Comments)    Pt reports causes bilateral leg pains.   Codeine     Bad dreams   Simvastatin Other (See Comments)    Leg cramps   Current Outpatient Medications  Medication Sig Dispense Refill   apixaban  (ELIQUIS ) 5 MG TABS tablet Take 1 tablet (5 mg total) by mouth 2 (two) times daily. 180 tablet 1   Cholecalciferol  (EQL VITAMIN D3) 1000 units tablet Take 1 tablet (1,000 Units total) by mouth daily.     escitalopram  (LEXAPRO ) 10 MG tablet TAKE 1 TABLET(10 MG) BY MOUTH DAILY 90 tablet 1   ferrous sulfate  325 (65 FE) MG EC tablet Take 1 tablet (325 mg total) by mouth 2 (two) times daily. (Patient taking differently: Take 325 mg by mouth 2 (two) times daily. 11/21/23: Reports during TOC call- she has heard from outpatient pharmacy- they are working to get this medication filled; she has not yet started taking: verbalizes plans to contact outpatient pharmacy after [facility] call to inquire- denies need for assistance) 60 tablet 3   furosemide  (LASIX ) 20 MG tablet Take one tablet 4 times per week 48 tablet 3   losartan  (COZAAR ) 100 MG tablet TAKE 1 TABLET(100 MG) BY MOUTH DAILY 90 tablet 3   pantoprazole  (PROTONIX ) 20 MG tablet Take 1 tablet (20 mg total) by mouth daily as needed for heartburn. 90 tablet 1   propranolol  (INDERAL ) 80 MG tablet Take 1 tablet (80 mg total) by mouth 2 (two) times daily. 180 tablet 3    rosuvastatin  (CRESTOR ) 10 MG tablet Take 1 tablet (10 mg total) by mouth daily. 90 tablet 3   Current Facility-Administered Medications  Medication Dose Route Frequency Provider Last Rate Last Admin   denosumab  (PROLIA ) injection 60 mg  60 mg Subcutaneous Once        No results found.  Review of Systems:   A ROS was performed including pertinent positives and negatives  as documented in the HPI.   Musculoskeletal Exam:    There were no vitals taken for this visit.  Right ankle incision is well-appearing without erythema or drainage.  She has 10 degrees dorsi and plantarflexion of the right foot.  Sensation is intact all distributions of the right foot with 2+ dorsalis pedis pulse  Imaging:    3 views right ankle: Status post fibular fixation without evidence complication  I personally reviewed and interpreted the radiographs.   Assessment:   Status post right ankle fixation overall doing very well.  She is still having some ankle swelling into this effect I did recommend ultrasound-guided injection of the ankle joint to help with this.  I will plan to see her back as needed  Plan :    - Ankle ultrasound-guided injection provided after verbal consent obtained    Procedure Note  Patient: Sarah Hendricks             Date of Birth: [birth year]           MRN: [id number]             Visit Date: 12/16/2023  Procedures: Visit Diagnoses:  1. Bimalleolar ankle fracture, right, closed, initial encounter     Medium Joint Inj: R ankle on 12/16/2023 12:29 PM Indications: pain Details: 22 G 1.5 in needle, ultrasound-guided anterior approach Medications: 4 mL lidocaine  1 %; 80 mg triamcinolone  acetonide 40 MG/ML Outcome: tolerated well, no immediate complications Immediately prior to procedure a time out was called to verify the correct patient, procedure, equipment, support staff and site/side marked as required. Patient was prepped and draped in the usual sterile fashion.              I personally saw and evaluated the patient, and participated in the management and treatment plan.  Elspeth Parker, MD Attending Physician, Orthopedic Surgery  This document was dictated using Dragon voice recognition software. A reasonable attempt at proof reading has been made to minimize errors.

## 2023-12-23 ENCOUNTER — Telehealth: Payer: Self-pay | Admitting: *Deleted

## 2023-12-23 DIAGNOSIS — T189XXA Foreign body of alimentary tract, part unspecified, initial encounter: Secondary | ICD-10-CM

## 2023-12-23 NOTE — Telephone Encounter (Signed)
 Left message for patient to call back

## 2023-12-23 NOTE — Telephone Encounter (Signed)
 Patient states that she has not yet seen the capsule in any bowel movements. She is asked to come to our radiology department today or Monday to have abdominal xray at which point we can see if the capsule still remains in the small bowel or if she has passed it. She verbalizes understanding.

## 2023-12-23 NOTE — Telephone Encounter (Signed)
-----   Message from Greig Corti sent at 12/22/2023  5:34 PM EDT ----- Regarding: Capsule Marcey,  Capsule ready on this pt - reports are on your desk-please charge for study and give me 2 signed copies back on my desk.  Study was incomplete with retention in small bowel and quite abnormal with significant ulcerated strictures ,-looks like Crohns.  To nurse - please get KUB on pt if she has not passed the capsule ( study done 7/30)

## 2023-12-26 ENCOUNTER — Telehealth: Payer: Self-pay | Admitting: Gastroenterology

## 2023-12-26 ENCOUNTER — Ambulatory Visit: Payer: Self-pay | Admitting: Gastroenterology

## 2023-12-26 ENCOUNTER — Ambulatory Visit (INDEPENDENT_AMBULATORY_CARE_PROVIDER_SITE_OTHER)
Admission: RE | Admit: 2023-12-26 | Discharge: 2023-12-26 | Disposition: A | Discharge status: Home / Self Care | Source: Ambulatory Visit | Attending: Gastroenterology

## 2023-12-26 DIAGNOSIS — I878 Other specified disorders of veins: Secondary | ICD-10-CM | POA: Diagnosis not present

## 2023-12-26 DIAGNOSIS — D509 Iron deficiency anemia, unspecified: Secondary | ICD-10-CM

## 2023-12-26 DIAGNOSIS — T189XXA Foreign body of alimentary tract, part unspecified, initial encounter: Secondary | ICD-10-CM | POA: Diagnosis not present

## 2023-12-26 NOTE — Telephone Encounter (Signed)
 Capsule endoscopy results from 12/14/23:  Procedure reviewed with Sarah Hendricks.  There is significant ulceration with some stricturing in the small intestine starting about 1 hour and 20 minutes beyond the first duodenal image.  There seems to be numerous areas of ulceration throughout the small intestine with some associated heme.  The capsule did not enter the colon.  I called the patient with the results.  Discussed I am concerned she may have Crohn's disease given these findings.  She adamantly denies any NSAID use.  Recommending fecal calprotectin to further evaluate and will need some type of enterography study to further evaluate for underlying Crohn's once we clarify if capsule has passed or not.  We did do an x-ray today to ensure no retained capsule and she does have a capsule in the right lower quadrant, unclear if this is in her ileum or colon.  She states she she has no abdominal pain, she is eating well, no nausea or vomiting, she denies any complaints at all.  Hopefully this has passed into her colon.  Recommend repeating an x-ray in 1 week to assess for interval progression and passage of the capsule.  If she develops any severe abdominal pain or vomiting in the interim she will contact us .  Once we repeat the x-ray, assuming that has passed out ideally like to do an MR enterography to further evaluate this.  If this capsule has not passed we will discuss next steps.   Pod A RN: - Can you please order another 2 view of the abdomen x-ray for 1 week to ensure passage of capsule, and remind her to go next Monday if she does not have it done - Can you please order a fecal calprotectin, she will come to the lab to get it done this week -Further recommendations on coordination of enterography study pending her course and x ray results.

## 2023-12-28 NOTE — Telephone Encounter (Signed)
 Can someone please contact this patient with my recommendations as outlined below. Thank you

## 2023-12-28 NOTE — Telephone Encounter (Signed)
 My apologies - I see her orders have been placed. I had spoken with her before. Please disregard my last message. Thanks

## 2024-01-02 ENCOUNTER — Encounter: Payer: Self-pay | Admitting: Gastroenterology

## 2024-01-02 ENCOUNTER — Other Ambulatory Visit (INDEPENDENT_AMBULATORY_CARE_PROVIDER_SITE_OTHER)

## 2024-01-02 ENCOUNTER — Ambulatory Visit (INDEPENDENT_AMBULATORY_CARE_PROVIDER_SITE_OTHER)
Admission: RE | Admit: 2024-01-02 | Discharge: 2024-01-02 | Disposition: A | Discharge status: Home / Self Care | Source: Ambulatory Visit | Attending: Gastroenterology | Admitting: Gastroenterology

## 2024-01-02 DIAGNOSIS — D509 Iron deficiency anemia, unspecified: Secondary | ICD-10-CM

## 2024-01-02 DIAGNOSIS — I878 Other specified disorders of veins: Secondary | ICD-10-CM | POA: Diagnosis not present

## 2024-01-02 DIAGNOSIS — Z0389 Encounter for observation for other suspected diseases and conditions ruled out: Secondary | ICD-10-CM | POA: Diagnosis not present

## 2024-01-02 LAB — HEMOGLOBIN: Hemoglobin: 12.5 g/dL (ref 12.0–15.0)

## 2024-01-03 ENCOUNTER — Ambulatory Visit: Payer: Self-pay | Admitting: Gastroenterology

## 2024-01-03 ENCOUNTER — Other Ambulatory Visit

## 2024-01-03 ENCOUNTER — Encounter: Payer: Self-pay | Admitting: Gastroenterology

## 2024-01-03 DIAGNOSIS — D509 Iron deficiency anemia, unspecified: Secondary | ICD-10-CM

## 2024-01-03 DIAGNOSIS — D649 Anemia, unspecified: Secondary | ICD-10-CM

## 2024-01-04 ENCOUNTER — Ambulatory Visit (INDEPENDENT_AMBULATORY_CARE_PROVIDER_SITE_OTHER): Admitting: Orthopaedic Surgery

## 2024-01-04 ENCOUNTER — Other Ambulatory Visit (INDEPENDENT_AMBULATORY_CARE_PROVIDER_SITE_OTHER): Payer: Self-pay

## 2024-01-04 DIAGNOSIS — G8929 Other chronic pain: Secondary | ICD-10-CM

## 2024-01-04 DIAGNOSIS — M25561 Pain in right knee: Secondary | ICD-10-CM | POA: Diagnosis not present

## 2024-01-04 DIAGNOSIS — M1711 Unilateral primary osteoarthritis, right knee: Secondary | ICD-10-CM | POA: Insufficient documentation

## 2024-01-04 NOTE — Progress Notes (Signed)
 Sarah Hendricks is a very pleasant 78 year old female sent from Dr. Morene Mace to evaluate and treat known significant arthritis in her right knee.  She has had multiple injections over the years in terms of steroids and hyaluronic acid.  At this point her right knee pain is daily and it is detrimentally affecting her mobility, her quality of life and her actives of daily living.  It is at the point where injections have not helped her anymore.  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  She says right now surgery is not an option until other things change with what is going on around her.  She does have a daughter with her today as well.  I was able to review her medications and past medical history within epic.  On exam her right knee does have varus malalignment and significant pain on the medial joint line and patellofemoral joint.  The knee is ligamentously stable.  X-rays on the canopy system of her right knee show bone-on-bone arthritis with varus malalignment, complete joint space loss of the medial compartment and patellofemoral joint and osteophytes around the knee.  I did show her knee replacement model and discussed in length in detail what knee replacement surgery involves.  She does state that her son had a knee replacement about 10 years ago and did well.  I did give her our surgery scheduler's card and if she gets a point with wanting to have this scheduled she will reach out and let us  know.  All questions and concerns were answered and addressed.

## 2024-01-05 LAB — CALPROTECTIN, FECAL: Calprotectin, Fecal: 120 ug/g (ref 0–120)

## 2024-01-20 ENCOUNTER — Other Ambulatory Visit: Payer: Self-pay | Admitting: Cardiology

## 2024-01-20 MED ORDER — PROPRANOLOL HCL 80 MG PO TABS: 80.0000 mg | ORAL_TABLET | Freq: Two times a day (BID) | ORAL | 3 refills | Status: DC

## 2024-01-23 ENCOUNTER — Other Ambulatory Visit: Payer: Self-pay

## 2024-01-25 ENCOUNTER — Telehealth: Payer: Self-pay

## 2024-01-25 MED ORDER — LOSARTAN POTASSIUM 100 MG PO TABS: ORAL_TABLET | ORAL | 2 refills | Status: DC

## 2024-01-25 MED ORDER — ROSUVASTATIN CALCIUM 10 MG PO TABS: 10.0000 mg | ORAL_TABLET | Freq: Every day | ORAL | 2 refills | Status: DC

## 2024-01-25 NOTE — Telephone Encounter (Signed)
   Pre-operative Risk Assessment    Patient Name: Sarah Hendricks  DOB: 10/01/1945 MRN: [id number]   Date of last office visit: 10/24/23 SHELDA BRUCKNER, MD Date of next office visit: NONE   Request for Surgical Clearance    Procedure:  LEFT TOTAL KNEE ARTHROPLASTY  Date of Surgery:  Clearance 03/06/24                                Surgeon:  BRUCKNER POLI, MD Surgeon's Group or Practice Name:  [facility] [place] Phone number:  [phone] Fax number:  [phone]   Type of Clearance Requested:   - Medical  - Pharmacy:  Hold Apixaban  (Eliquis ) 3 DAYS   Type of Anesthesia:  Spinal AND BLOCK   Additional requests/questions:    SignedLucie DELENA Ku   01/25/2024, 9:13 AM

## 2024-01-25 NOTE — Telephone Encounter (Signed)
 Pharmacy please advise on holding Eliquis  prior to left total knee arthroplasty scheduled for 03/06/2024.  Last labs (CBC and CMET) 11/15/2023. Thank you.

## 2024-01-26 NOTE — Telephone Encounter (Signed)
   Name: Sarah Hendricks  DOB: [birth year]  MRN: [id number]  Primary Cardiologist: Shelda Bruckner, MD  Chart reviewed as part of pre-operative protocol coverage. Because of Sarah Hendricks past medical history and time since last visit, she will require a follow-up telephone visit in order to better assess preoperative cardiovascular risk.  Pre-op covering staff: - Please schedule appointment and call patient to inform them. If patient already had an upcoming appointment within acceptable timeframe, please add pre-op clearance to the appointment notes so provider is aware. - Please contact requesting surgeon's office via preferred method (i.e, phone, fax) to inform them of need for appointment prior to surgery.  Patient has not had an Afib/aflutter ablation or Watchman within the last 3 months or DCCV within the last 30 days    Per office protocol, patient can hold Eliquis  for 3 days prior to procedure.   Patient will not need bridging with Lovenox (enoxaparin) around procedure.  [name], PA-C  01/26/2024, 8:57 AM

## 2024-01-26 NOTE — Telephone Encounter (Signed)
 Patient with diagnosis of atrial fibrillation on Eliquis  for anticoagulation.     Procedure:  LEFT TOTAL KNEE ARTHROPLASTY   Date of Surgery:  Clearance 03/06/24       CHA2DS2-VASc Score = 6   This indicates a 9.7% annual risk of stroke. The patient's score is based upon: CHF History: 1 HTN History: 1 Diabetes History: 0 Stroke History: 0 Vascular Disease History: 1 Age Score: 2 Gender Score: 1  CrCl 57 Platelet count 338  Patient has not had an Afib/aflutter ablation or Watchman within the last 3 months or DCCV within the last 30 days   Per office protocol, patient can hold Eliquis  for 3 days prior to procedure.   Patient will not need bridging with Lovenox (enoxaparin) around procedure.  **This guidance is not considered finalized until pre-operative APP has relayed final recommendations.**

## 2024-01-27 ENCOUNTER — Telehealth: Payer: Self-pay

## 2024-01-27 NOTE — Telephone Encounter (Signed)
 S/W pt and scheduled TELE preop appt 02/16/24. Med Rec and Consent done   Will update the surgeons office

## 2024-01-27 NOTE — Telephone Encounter (Signed)
 Med Rec and Consent done     Patient Consent for Virtual Visit        Sarah Hendricks has provided verbal consent on 01/27/2024 for a virtual visit (video or telephone).   CONSENT FOR VIRTUAL VISIT FOR:  Sarah Hendricks  By participating in this virtual visit I agree to the following:  I hereby voluntarily request, consent and authorize [name] and its employed or contracted physicians, physician assistants, nurse practitioners or other licensed health care professionals (the Practitioner), to provide me with telemedicine health care services (the "Services) as deemed necessary by the treating Practitioner. I acknowledge and consent to receive the Services by the Practitioner via telemedicine. I understand that the telemedicine visit will involve communicating with the Practitioner through live audiovisual communication technology and the disclosure of certain medical information by electronic transmission. I acknowledge that I have been given the opportunity to request an in-person assessment or other available alternative prior to the telemedicine visit and am voluntarily participating in the telemedicine visit.  I understand that I have the right to withhold or withdraw my consent to the use of telemedicine in the course of my care at any time, without affecting my right to future care or treatment, and that the Practitioner or I may terminate the telemedicine visit at any time. I understand that I have the right to inspect all information obtained and/or recorded in the course of the telemedicine visit and may receive copies of available information for a reasonable fee.  I understand that some of the potential risks of receiving the Services via telemedicine include:  Delay or interruption in medical evaluation due to technological equipment failure or disruption; Information transmitted may not be sufficient (e.g. poor resolution of images) to allow for appropriate medical  decision making by the Practitioner; and/or  In rare instances, security protocols could fail, causing a breach of personal health information.  Furthermore, I acknowledge that it is my responsibility to provide information about my medical history, conditions and care that is complete and accurate to the best of my ability. I acknowledge that Practitioner's advice, recommendations, and/or decision may be based on factors not within their control, such as incomplete or inaccurate data provided by me or distortions of diagnostic images or specimens that may result from electronic transmissions. I understand that the practice of medicine is not an exact science and that Practitioner makes no warranties or guarantees regarding treatment outcomes. I acknowledge that a copy of this consent can be made available to me via my patient portal [facility] MyChart), or I can request a printed copy by calling the office of [facility].    I understand that my insurance will be billed for this visit.   I have read or had this consent read to me. I understand the contents of this consent, which adequately explains the benefits and risks of the Services being provided via telemedicine.  I have been provided ample opportunity to ask questions regarding this consent and the Services and have had my questions answered to my satisfaction. I give my informed consent for the services to be provided through the use of telemedicine in my medical care

## 2024-02-06 ENCOUNTER — Ambulatory Visit

## 2024-02-06 ENCOUNTER — Ambulatory Visit: Admitting: Sports Medicine

## 2024-02-06 VITALS — HR 62 | Ht 63.0 in | Wt 150.0 lb

## 2024-02-06 DIAGNOSIS — G8929 Other chronic pain: Secondary | ICD-10-CM | POA: Diagnosis not present

## 2024-02-06 DIAGNOSIS — M1711 Unilateral primary osteoarthritis, right knee: Secondary | ICD-10-CM | POA: Diagnosis not present

## 2024-02-06 DIAGNOSIS — M79641 Pain in right hand: Secondary | ICD-10-CM

## 2024-02-06 DIAGNOSIS — M25561 Pain in right knee: Secondary | ICD-10-CM

## 2024-02-06 NOTE — Progress Notes (Signed)
 Ben Braniya Farrugia D.CLEMENTEEN [facility] [address] [place] [address] Phone: [phone]   Assessment and Plan:     1. Right hand pain (Primary) -Acute, initial visit - Right hand pain and swelling after striking her hand during a fall last week.  Consistent with contusion - X-ray obtained in clinic.  My interpretation: No acute fracture or dislocation.  Degenerative changes present throughout hand, most advanced at [facility] - Use Tylenol  500 to 1000 mg tablets 2-3 times a day for day-to-day pain relief -Use Voltaren gel topically over areas of pain  2. Primary osteoarthritis of right knee 3. Chronic pain of right knee -Chronic with exacerbation, subsequent visit - Continued flare of right knee pain most consistent with flare of osteoarthritis.  Patient has had no relief with intra-articular CSI, Zilretta , HA injections - patient's MRI which showed severe medial compartment chondromalacia with full-thickness defect, moderate degenerative edema and moderate effusion - Do not recommend NSAID or prednisone use due to past medical history of atrial fibrillation on current anticoagulation with Eliquis  -Patient is now interested in total knee replacement.  She has an appointment with orthopedic surgery - Discussed alternative treatments such as medial knee off loader brace, GAE procedure.  Patient not interested at this time, but may contact clinic if she would like referral for either    Pertinent previous records reviewed include none   Follow Up: None   Subjective:   I, [name], am serving as a Neurosurgeon for Doctor Morene Mace  Chief Complaint: right hand pain   HPI:   02/06/24 Patient is a 78 year old female with right hand pain. Patient states right knee has gotten worse. Right hand pain started last weeks she was falling backwards and hit her hand on something. Tylenol  helps with the pain in the knee and the hand. Hand pain has decreased  some  Relevant Historical Information: Hypertension, osteoporosis, prediabetes, atrial fibrillation, on anticoagulation with Eliquis   Additional pertinent review of systems negative.   Current Outpatient Medications:    apixaban  (ELIQUIS ) 5 MG TABS tablet, Take 1 tablet (5 mg total) by mouth 2 (two) times daily., Disp: 180 tablet, Rfl: 1   Cholecalciferol  (EQL VITAMIN D3) 1000 units tablet, Take 1 tablet (1,000 Units total) by mouth daily., Disp: , Rfl:    escitalopram  (LEXAPRO ) 10 MG tablet, TAKE 1 TABLET(10 MG) BY MOUTH DAILY, Disp: 90 tablet, Rfl: 1   ferrous sulfate  325 (65 FE) MG EC tablet, Take 1 tablet (325 mg total) by mouth 2 (two) times daily. (Patient taking differently: Take 325 mg by mouth 2 (two) times daily. 11/21/23: Reports during TOC call- she has heard from outpatient pharmacy- they are working to get this medication filled; she has not yet started taking: verbalizes plans to contact outpatient pharmacy after [facility] call to inquire- denies need for assistance), Disp: 60 tablet, Rfl: 3   furosemide  (LASIX ) 20 MG tablet, Take one tablet 4 times per week, Disp: 48 tablet, Rfl: 3   losartan  (COZAAR ) 100 MG tablet, TAKE 1 TABLET(100 MG) BY MOUTH DAILY, Disp: 90 tablet, Rfl: 2   pantoprazole  (PROTONIX ) 20 MG tablet, Take 1 tablet (20 mg total) by mouth daily as needed for heartburn., Disp: 90 tablet, Rfl: 1   propranolol  (INDERAL ) 80 MG tablet, Take 1 tablet (80 mg total) by mouth 2 (two) times daily., Disp: 180 tablet, Rfl: 3   rosuvastatin  (CRESTOR ) 10 MG tablet, Take 1 tablet (10 mg total) by mouth daily., Disp: 90  tablet, Rfl: 2  Current Facility-Administered Medications:    denosumab  (PROLIA ) injection 60 mg, 60 mg, Subcutaneous, Once,    Objective:     Vitals:   02/06/24 1530  Pulse: 62  SpO2: 97%  Weight: 150 lb (68 kg)  Height: 5' 3 (1.6 m)      Body mass index is 26.57 kg/m.    Physical Exam:    General: Appears well, nad, nontoxic and pleasant Neuro:sensation  intact, strength is 5/5 in upper extremities, muscle tone wnl Skin:no susupicious lesions or rashes  Right hand/Wrist:   No deformity or swelling appreciated. ROM  Ext 90, flexion 70, radial/ulnar deviation 30 Generalized mild ttp over the snuff box, dorsal carpals, volar carpals, radial styloid, ulnar styloid, 1st mcp, tfcc Negative Tinel's, Phalen's, Prayer Tests Negative finklestein Neg tfcc bounce test No pain with resisted ext, flex or deviation    Electronically signed by:  Odis Mace D.[facility]MENT[facility] 4:02 PM 02/06/24

## 2024-02-06 NOTE — Patient Instructions (Signed)
 Tylenol  [phone] mg 2-3 times a day for pain relief   Voltaren gel over areas of pain   Call and ask ortho to bump you appointment up   As needed follow up

## 2024-02-08 ENCOUNTER — Telehealth: Payer: Self-pay

## 2024-02-08 DIAGNOSIS — D509 Iron deficiency anemia, unspecified: Secondary | ICD-10-CM

## 2024-02-08 NOTE — Telephone Encounter (Signed)
 Called and spoke to patient.  She will go to the lab one day next week for Hgb

## 2024-02-08 NOTE — Telephone Encounter (Signed)
-----   Message from [facility] sent at 01/03/2024  7:56 PM EDT ----- Regarding: hgb Hgb in 6 weeks - one day next week

## 2024-02-10 ENCOUNTER — Telehealth: Payer: Self-pay

## 2024-02-10 ENCOUNTER — Other Ambulatory Visit: Payer: Self-pay | Admitting: Orthopaedic Surgery

## 2024-02-10 MED ORDER — TRAMADOL HCL 50 MG PO TABS: 50.0000 mg | ORAL_TABLET | Freq: Two times a day (BID) | ORAL | 0 refills | Status: DC | PRN

## 2024-02-10 NOTE — Telephone Encounter (Signed)
 Sarah Hendricks, this is [name]. Is there anyway Dr Vernetta would send me something for pain. Thank you.

## 2024-02-13 ENCOUNTER — Ambulatory Visit: Payer: Self-pay | Admitting: Sports Medicine

## 2024-02-14 ENCOUNTER — Other Ambulatory Visit (INDEPENDENT_AMBULATORY_CARE_PROVIDER_SITE_OTHER)

## 2024-02-14 ENCOUNTER — Other Ambulatory Visit: Payer: Self-pay | Admitting: Physician Assistant

## 2024-02-14 ENCOUNTER — Ambulatory Visit: Payer: Self-pay | Admitting: Gastroenterology

## 2024-02-14 ENCOUNTER — Ambulatory Visit (INDEPENDENT_AMBULATORY_CARE_PROVIDER_SITE_OTHER): Admitting: Gastroenterology

## 2024-02-14 ENCOUNTER — Encounter: Payer: Self-pay | Admitting: Gastroenterology

## 2024-02-14 VITALS — BP 120/78 | HR 68 | Ht 63.0 in | Wt 147.8 lb

## 2024-02-14 DIAGNOSIS — Z7901 Long term (current) use of anticoagulants: Secondary | ICD-10-CM | POA: Diagnosis not present

## 2024-02-14 DIAGNOSIS — D509 Iron deficiency anemia, unspecified: Secondary | ICD-10-CM | POA: Diagnosis not present

## 2024-02-14 DIAGNOSIS — R933 Abnormal findings on diagnostic imaging of other parts of digestive tract: Secondary | ICD-10-CM

## 2024-02-14 DIAGNOSIS — Z01818 Encounter for other preprocedural examination: Secondary | ICD-10-CM

## 2024-02-14 LAB — HEMOGLOBIN: Hemoglobin: 12.1 g/dL (ref 12.0–15.0)

## 2024-02-14 NOTE — Progress Notes (Signed)
 HPI :  78 year old female with history of A-fib on Eliquis , history of CAD status post PCI, history of breast cancer, admitted to the hospital in July of this year with symptomatic anemia.  Recall she presented with fatigue, shortness of breath.  Hemoglobin was 7.2, microcytosis, iron deficiency.  She had endorsed perhaps some dark stools preceding her hospitalization but at the time of her admission had no active GI bleeding.  Denied any routine NSAID use.  She was given a blood transfusion and underwent a push enteroscopy initially with me which did not reveal clear source for her anemia.  She then underwent a colonoscopy which likewise did not show a cause for her symptoms.  She was given IV iron, discharged in stable condition and we pursued a capsule endoscopy as outpatient.  Capsule endoscopy was done on July 30.  She had significant ulcerations of her mid small bowel, the capsule timed out in her small intestine, unclear where the extent was reached.  On August 11 she had a follow-up x-ray which showed capsule retention.  Fortunately at the time she had no evidence of bowel obstruction and she tolerated it okay.  An x-ray a week later showed that the capsule had fortunately passed.  She is here with her husband today.  She has been taking an oral iron pill and states she has been feeling much better.  She again denies any blood in her stools.  She is eating okay.  She denies any abdominal pains.  She denies any diarrhea or problems with her bowels.  No family history of inflammatory bowel disease.  She is not using any NSAIDs.  She states she is undergoing knee replacement next week and really wants to focus on that and getting that better first.  We discussed options and how to proceed moving forward.  Fortunately her hemoglobin has normalized to 12.1, that was drawn this morning and now normal.  She has not had any prior cross-sectional imaging that I can see of her abdomen  She otherwise did  have colonoscopy done in April 2022 per Dr. Eda with findings of medium size internal hemorrhoids and multiple diverticuli otherwise negative.    Recent workup: Enteroscopy 11/16/23: - Esophagogastric landmarks identified. - 2 cm hiatal hernia. - Z-line regular. - Normal esophagus otherwise. - Normal stomach. - Duodenal diverticulum. - Normal duodenum otherwise. - The examined portion of the jejunum was normal.   Colonoscopy 11/17/23: - The examined portion of the ileum was normal. - Diverticulosis in the transverse colon and in the left colon. - Internal hemorrhoids. - The examination was otherwise normal. - No specimens collected. No cause for iron deficiency / heme positive stools on colonoscopy and enteroscopy. Suspect small bowel source. She has had no overt bleeding. I think can go home with close monitoring and outpatient follow up for capsule endoscopy.   Capsule endoscopy 12/14/23: Incomplete exam - capsule retained There is significant ulceration with some stricturing in the small intestine starting about 1 hour and 20 minutes beyond the first duodenal image.  There seems to be numerous areas of ulceration throughout the small intestine with some associated heme.  The capsule did not enter the colon.    Xray 12/26/23: IMPRESSION: Radiopaque foreign body typical of endoscopy capsule projects over the right lower quadrant in the region of the cecum or terminal ileum.   Xray 01/02/24: IMPRESSION: The previous pill endoscopy capsule is no longer visualized.  Fecal calprotectin 01/03/24: 120   Past Medical History:  Diagnosis Date   Allergy    Anxiety    Arthritis    Bimalleolar ankle fracture, right, closed, initial encounter    Breast cancer (HCC) 1994   lumpectomy and XRT, no chemo   CAD (coronary artery disease) 2008   PTCA, in [place]   GERD (gastroesophageal reflux disease)    History of chicken pox    Hyperlipidemia    Hypertension    Myocardial infarction (HCC)  03/15/2007   stent placed   Vitamin D  deficiency      Past Surgical History:  Procedure Laterality Date   BREAST BIOPSY Left 1994   BREAST LUMPECTOMY Left 1994   CARDIOVERSION N/A 07/15/2023   Procedure: CARDIOVERSION;  Surgeon: Loni Soyla LABOR, MD;  Location: [facility];  Service: Cardiovascular;  Laterality: N/A;   CATARACT EXTRACTION, BILATERAL     COLONOSCOPY N/A 11/17/2023   Procedure: COLONOSCOPY;  Surgeon: Leigh Elspeth SQUIBB, MD;  Location: [facility] ENDOSCOPY;  Service: Gastroenterology;  Laterality: N/A;   CORONARY ANGIOPLASTY WITH STENT PLACEMENT  2008   CRANIECTOMY FOR DEPRESSED SKULL FRACTURE  1986   ENTEROSCOPY N/A 11/16/2023   Procedure: ENTEROSCOPY;  Surgeon: Leigh Elspeth SQUIBB, MD;  Location: [facility] ENDOSCOPY;  Service: Gastroenterology;  Laterality: N/A;   ORIF ANKLE FRACTURE Right 06/20/2023   Procedure: RIGHT OPEN REDUCTION INTERNAL FIXATION (ORIF) ANKLE FRACTURE;  Surgeon: Genelle Elspeth, MD;  Location: [facility];  Service: Orthopedics;  Laterality: Right;   TONSILLECTOMY  1966   Family History  Problem Relation Age of Onset   Breast cancer Mother    Hypertension Mother    Heart disease Mother    Stroke Mother    Arthritis Father    Heart disease Father        died at 51 of heart attack   Heart disease Brother    Stroke Maternal Grandmother    Heart disease Maternal Grandfather    Colon cancer Neg Hx    Esophageal cancer Neg Hx    Rectal cancer Neg Hx    Stomach cancer Neg Hx    Social History   Tobacco Use   Smoking status: Never    Passive exposure: Never   Smokeless tobacco: Never  Vaping Use   Vaping status: Never Used  Substance Use Topics   Alcohol use: No   Drug use: No   Current Outpatient Medications  Medication Sig Dispense Refill   apixaban  (ELIQUIS ) 5 MG TABS tablet Take 1 tablet (5 mg total) by mouth 2 (two) times daily. 180 tablet 1   Cholecalciferol  (EQL VITAMIN D3) 1000 units tablet Take 1 tablet (1,000 Units  total) by mouth daily.     escitalopram  (LEXAPRO ) 10 MG tablet TAKE 1 TABLET(10 MG) BY MOUTH DAILY 90 tablet 1   ferrous sulfate  325 (65 FE) MG EC tablet Take 1 tablet (325 mg total) by mouth 2 (two) times daily. (Patient taking differently: Take 325 mg by mouth 2 (two) times daily. 11/21/23: Reports during TOC call- she has heard from outpatient pharmacy- they are working to get this medication filled; she has not yet started taking: verbalizes plans to contact outpatient pharmacy after [facility] call to inquire- denies need for assistance) 60 tablet 3   furosemide  (LASIX ) 20 MG tablet Take one tablet 4 times per week 48 tablet 3   losartan  (COZAAR ) 100 MG tablet TAKE 1 TABLET(100 MG) BY MOUTH DAILY 90 tablet 2   pantoprazole  (PROTONIX ) 20 MG tablet Take 1 tablet (20 mg total) by mouth daily  as needed for heartburn. 90 tablet 1   propranolol  (INDERAL ) 80 MG tablet Take 1 tablet (80 mg total) by mouth 2 (two) times daily. 180 tablet 3   rosuvastatin  (CRESTOR ) 10 MG tablet Take 1 tablet (10 mg total) by mouth daily. 90 tablet 2   traMADol  (ULTRAM ) 50 MG tablet Take 1-2 tablets (50-100 mg total) by mouth every 12 (twelve) hours as needed. (Patient not taking: Reported on 02/14/2024) 30 tablet 0   Current Facility-Administered Medications  Medication Dose Route Frequency Provider Last Rate Last Admin   denosumab  (PROLIA ) injection 60 mg  60 mg Subcutaneous Once        Allergies  Allergen Reactions   Acetaminophen -Codeine Other (See Comments)    Bad dreams   Tigan [Trimethobenzamide] Other (See Comments)    Bad dreams   Alendronate     Caused leg pains    Atorvastatin  Other (See Comments)    Pt reports causes bilateral leg pains.   Codeine     Bad dreams   Simvastatin Other (See Comments)    Leg cramps     Review of Systems: All systems reviewed and negative except where noted in HPI.    DG Hand Complete Right Result Date: 02/11/2024 CLINICAL DATA:  Hand pain.  Fall. EXAM: RIGHT HAND -  COMPLETE 3+ VIEW COMPARISON:  None Available. FINDINGS: No evidence for acute fracture or dislocation. There is multifocal joint space narrowing with subchondral sclerosis and marginal osteophytosis, most advanced at the triscaphe joint as well as multiple DIP joints. No periarticular erosions are visualized. The soft tissues are unremarkable. IMPRESSION: Multifocal osteoarthritis.  No acute fracture or dislocation. Electronically Signed   By: Reyes Honer M.D.   On: 02/11/2024 11:26    Physical Exam: BP 120/78   Pulse 68   Ht 5' 3 (1.6 m)   Wt 147 lb 12.8 oz (67 kg)   BMI 26.18 kg/m  Constitutional: Pleasant,well-developed, female in no acute distress. Neurological: Alert and oriented to person place and time. Psychiatric: Normal mood and affect. Behavior is normal.   ASSESSMENT: 78 y.o. female here for assessment of the following  1. Iron deficiency anemia, unspecified iron deficiency anemia type   2. Abnormal finding on GI tract imaging   3. Anticoagulated    Symptomatic iron deficiency with heme positive stool in the setting of anticoagulation.  EGD colonoscopy and capsule endoscopy done as above.  She has small bowel ulcerations with luminal narrowing causing her iron deficiency.  We discussed differential diagnosis.  She does not take any NSAIDs to have caused this.  IBD and other enteropathies are on the DDx.  Fecal calprotectin was mildly elevated.    To further evaluate this, to assess extent, and severity of disease and better evaluate for IBD, and risk for obstruction/complications, recommend CT enterography.  I discussed what this is with her.  She seems agreeable to do it, however the question is timing.  She is scheduled to have a knee replacement next week and really wants to focus on that.  I offered to get this exam done before then but she did not want to pursue it right now.  I counseled her there is risk for bowel obstruction etc. if this goes untreated or unevaluated  and she needs to be mindful of this and contact me should any symptoms occur.  I recommend she completely avoid NSAIDs while this is being evaluated, she should tell her orthopedist she should avoid these in regards to her bowel issues  and anemia, and make them aware of her active issues.  We will circle back with her in the next month or so to see if she would be willing to schedule the enterography, or she can contact us  at her convenience if she wants to do it in the interim.  We will continue to keep an eye on her hemoglobin, repeat in 3 months, continue iron supplementation in the interim  PLAN: - continue iron - recommend CT enterography - full discussion above, she will do it but declines doing it before her knee surgery. Counseled on risks for obstruction - avoid all NSAIDs - trend Hgb - repeat in 3 months  I spent 35 minutes of time, including in depth chart review, face-to-face time with the patient, and documentation.   Marcey Naval, MD [facility]

## 2024-02-16 ENCOUNTER — Ambulatory Visit: Attending: Cardiology

## 2024-02-16 DIAGNOSIS — Z01810 Encounter for preprocedural cardiovascular examination: Secondary | ICD-10-CM | POA: Diagnosis not present

## 2024-02-16 NOTE — Progress Notes (Signed)
 Virtual Visit via Telephone Note   Because of Sarah Hendricks co-morbid illnesses, she is at least at moderate risk for complications without adequate follow up.  This format is felt to be most appropriate for this patient at this time.  Due to technical limitations with video connection (technology), today's appointment will be conducted as an audio only telehealth visit, and Sarah Hendricks verbally agreed to proceed in this manner.   All issues noted in this document were discussed and addressed.  No physical exam could be performed with this format.  Evaluation Performed:  Preoperative cardiovascular risk assessment _____________   Date:  02/16/2024   Patient ID:  Sarah Hendricks, DOB [birth year], MRN [id number] Patient Location:  Home Provider location:   Office  Primary Care Provider:  Geofm Glade PARAS, MD Primary Cardiologist:  Sarah Bruckner, MD  Chief Complaint / Patient Profile   78 y.o. y/o female with a h/o paroxysmal AF (on Eliquis ), HTN, HLD, CAD s/p PCI in 2008, breast CA s/p lumpectomy and XRT who is pending left total knee arthroplasty and presents today for telephonic preoperative cardiovascular risk assessment.  History of Present Illness    Sarah Hendricks is a 78 y.o. female who presents via audio/video conferencing for a telehealth visit today.  Pt was last seen in cardiology clinic on 10/24/2023 by Dr. Bruckner.  At that time Sarah Hendricks was doing well but reported not sleeping well and was tired.  She was admitted on 11/15/2023 with complaint of dark stool with symptomatic anemia and Eliquis  was held and resumed prior to discharge with instruction to dark stools persisted.  The patient is now pending procedure as outlined above. Since her last visit, she has been doing well with no new cardiac complaints.  She denies chest pain, shortness of breath, lower extremity edema, fatigue, palpitations, melena, hematuria, hemoptysis, diaphoresis, weakness,  presyncope, syncope, orthopnea, and PND.    Past Medical History    Past Medical History:  Diagnosis Date   Allergy    Anxiety    Arthritis    Bimalleolar ankle fracture, right, closed, initial encounter    Breast cancer (HCC) 1994   lumpectomy and XRT, no chemo   CAD (coronary artery disease) 2008   PTCA, in [place]   GERD (gastroesophageal reflux disease)    History of chicken pox    Hyperlipidemia    Hypertension    Myocardial infarction (HCC) 03/15/2007   stent placed   Vitamin D  deficiency    Past Surgical History:  Procedure Laterality Date   BREAST BIOPSY Left 1994   BREAST LUMPECTOMY Left 1994   CARDIOVERSION N/A 07/15/2023   Procedure: CARDIOVERSION;  Surgeon: Sarah Soyla LABOR, MD;  Location: [facility];  Service: Cardiovascular;  Laterality: N/A;   CATARACT EXTRACTION, BILATERAL     COLONOSCOPY N/A 11/17/2023   Procedure: COLONOSCOPY;  Surgeon: Sarah Elspeth SQUIBB, MD;  [facility] END[facility]COPY;  Service: Gastroenterology;  Laterality: N/A;   CORONARY ANGIOPLASTY WITH STENT PLACEMENT  2008   CRANIECTOMY FOR DEPRESSED SKULL FRACTURE  1986   ENTEROSCOPY N/A 11/16/2023   Procedure: ENTEROSCOPY;  Surgeon: Sarah Elspeth SQUIBB, MD;  Location: [facility] ENDOSCOPY;  Service: Gastroenterology;  Laterality: N/A;   ORIF ANKLE FRACTURE Right 06/20/2023   Procedure: RIGHT OPEN REDUCTION INTERNAL FIXATION (ORIF) ANKLE FRACTURE;  Surgeon: Genelle Elspeth, MD;  Location: [facility];  Service: Orthopedics;  Laterality: Right;   TONSILLECTOMY  1966    Allergies  Allergies  Allergen Reactions   Acetaminophen -Codeine Other (See  Comments)    Bad dreams   Tigan [Trimethobenzamide] Other (See Comments)    Bad dreams   Atorvastatin  Other (See Comments)    Pt reports causes bilateral leg pains.   Codeine     Bad dreams   Fosamax [Alendronate] Other (See Comments)    Caused leg pains    Nsaids Other (See Comments)    History of GI bleeds   Simvastatin Other (See  Comments)    Leg cramps    Home Medications    Prior to Admission medications   Medication Sig Start Date End Date Taking? Authorizing Provider  apixaban  (ELIQUIS ) 5 MG TABS tablet Take 1 tablet (5 mg total) by mouth 2 (two) times daily. 11/18/23   Rashid, Farhan, MD  Calcium -Magnesium-Zinc (CAL-MAG-ZINC PO) Take 1 tablet by mouth every other day.    [provider]  Cholecalciferol  (EQL VITAMIN D3) 1000 units tablet Take 1 tablet (1,000 Units total) by mouth daily. 06/04/15   Sarah Glade PARAS, MD  escitalopram  (LEXAPRO ) 10 MG tablet TAKE 1 TABLET(10 MG) BY MOUTH DAILY Patient taking differently: Take 10 mg by mouth daily as needed (anxiety). 03/25/23   Sarah Glade PARAS, MD  ferrous sulfate  325 (65 FE) MG EC tablet Take 1 tablet (325 mg total) by mouth 2 (two) times daily. 11/17/23 11/16/24  Rashid, Farhan, MD  furosemide  (LASIX ) 20 MG tablet Take one tablet 4 times per week Patient taking differently: Take 20 mg by mouth 4 (four) times a week. Sundays, Mondays, Wednesdays & Fridays. 10/24/23   Lonni Slain, MD  losartan  (COZAAR ) 100 MG tablet TAKE 1 TABLET(100 MG) BY MOUTH DAILY 01/25/24   Lonni Slain, MD  pantoprazole  (PROTONIX ) 20 MG tablet Take 1 tablet (20 mg total) by mouth daily as needed for heartburn. 12/05/23   Sarah Glade PARAS, MD  propranolol  (INDERAL ) 80 MG tablet Take 1 tablet (80 mg total) by mouth 2 (two) times daily. 01/20/24   Lonni Slain, MD  rosuvastatin  (CRESTOR ) 10 MG tablet Take 1 tablet (10 mg total) by mouth daily. 01/25/24   Lonni Slain, MD  traMADol  (ULTRAM ) 50 MG tablet Take 1-2 tablets (50-100 mg total) by mouth every 12 (twelve) hours as needed. Patient not taking: No sig reported 02/10/24   Vernetta Lonni GRADE, MD    Physical Exam    Vital Signs:  Sarah Hendricks does not have vital signs available for review today.  Given telephonic nature of communication, physical exam is limited. AAOx3. NAD. Normal affect.  Speech and  respirations are unlabored.  Accessory Clinical Findings    None  Assessment & Plan    1.  Preoperative Cardiovascular Risk Assessment: - Patient's RCRI score is 6.6%  The patient affirms she has been doing well without any new cardiac symptoms. They are able to achieve 5 METS without cardiac limitations. Therefore, based on ACC/AHA guidelines, the patient would be at acceptable risk for the planned procedure without further cardiovascular testing. The patient was advised that if she develops new symptoms prior to surgery to contact our office to arrange for a follow-up visit, and she verbalized understanding.   Per office protocol, patient can hold Eliquis  for 3 days prior to procedure.   Patient will not need bridging with Lovenox (enoxaparin) around procedure.  The patient was advised that if she develops new symptoms prior to surgery to contact our office to arrange for a follow-up visit, and she verbalized understanding.  (Reminder: Include SBE prophylaxis/Antiplatelet/Anticoag Instructions6)  A copy of this note will be  routed to requesting surgeon.  Time:   Today, I have spent 6 minutes with the patient with telehealth technology discussing medical history, symptoms, and management plan.     Wyn Raddle, Jackee Shove, NP  02/16/2024, 7:39 AM

## 2024-02-16 NOTE — Pre-Procedure Instructions (Addendum)
 Surgical Instructions   Your procedure is scheduled on Thursday, February 23, 2024. Report to [facility] Main Entrance A at 6:45 A.M., then check in with the Admitting office. Any questions or running late day of surgery: call [phone]  Questions prior to your surgery date: call [phone], Monday-Friday, 8am-4pm. If you experience any cold or flu symptoms such as cough, fever, chills, shortness of breath, etc. between now and your scheduled surgery, please notify us  at the above number.     Remember:  Do not eat after midnight the night before your surgery  You may drink clear liquids until 5:45am the morning of your surgery.   Clear liquids allowed are: Water , Non-Citrus Juices (without pulp), Carbonated Beverages, Clear Tea (no milk, honey, etc.), Black Coffee Only (NO MILK, CREAM OR POWDERED CREAMER of any kind), and Gatorade.  Patient Instructions  The night before surgery:  No food after midnight. ONLY clear liquids after midnight  The day of surgery (if you do NOT have diabetes):  Drink ONE (1) Pre-Surgery Clear Ensure by 5:45am the morning of surgery. Drink in one sitting. Do not sip.  This drink was given to you during your hospital  pre-op appointment visit.  Nothing else to drink after completing the  Pre-Surgery Clear Ensure.         If you have questions, please contact your surgeon's office.     Take these medicines the morning of surgery with A SIP OF WATER  : Propranolol  (Inderal ) Rosuvastatin  (Crestor )  May take these medicines IF NEEDED: Escitalopram  (Lexapro ) Pantoprazole  (Protonix )  Per your Doctor, STOP your ELIQUIS  3 days prior to surgery. Your last dose should be on Sunday, 02/19/24.   One week prior to surgery, STOP taking any Aspirin (unless otherwise instructed by your surgeon) Aleve, Naproxen, Ibuprofen, Motrin, Advil, Goody's, BC's, all herbal medications, fish oil, and non-prescription vitamins.                     Do NOT Smoke  (Tobacco/Vaping) for 24 hours prior to your procedure.  If you use a CPAP at night, you may bring your mask/headgear for your overnight stay.   You will be asked to remove any contacts, glasses, piercing's, hearing aid's, dentures/partials prior to surgery. Please bring cases for these items if needed.    Patients discharged the day of surgery will not be allowed to drive home, and someone needs to stay with them for 24 hours.  SURGICAL WAITING ROOM VISITATION Patients may have no more than 2 support people in the waiting area - these visitors may rotate.   Pre-op nurse will coordinate an appropriate time for 1 ADULT support person, who may not rotate, to accompany patient in pre-op.  Children under the age of 3 must have an adult with them who is not the patient and must remain in the main waiting area with an adult.  If the patient needs to stay at the hospital during part of their recovery, the visitor guidelines for inpatient rooms apply.  Please refer to the [facility] website for the visitor guidelines for any additional information.   If you received a COVID test during your pre-op visit  it is requested that you wear a mask when out in public, stay away from anyone that may not be feeling well and notify your surgeon if you develop symptoms. If you have been in contact with anyone that has tested positive in the last 10 days please notify you surgeon.  Pre-operative 4 CHG Bathing Instructions   You can play a key role in reducing the risk of infection after surgery. Your skin needs to be as free of germs as possible. You can reduce the number of germs on your skin by washing with CHG (chlorhexidine gluconate) soap before surgery. CHG is an antiseptic soap that kills germs and continues to kill germs even after washing.   DO NOT use if you have an allergy to chlorhexidine/CHG or antibacterial soaps. If your skin becomes reddened or irritated, stop using the CHG and notify one  of our RNs at [phone].   Please shower with the CHG soap starting 4 days before surgery using the following schedule:     Please keep in mind the following:  DO NOT shave, including legs and underarms, starting the day of your first shower.   You may shave your face at any point before/day of surgery.  Place clean sheets on your bed the day you start using CHG soap. Use a clean washcloth (not used since being washed) for each shower. DO NOT sleep with pets once you start using the CHG.   CHG Shower Instructions:  Wash your face and private area with normal soap. If you choose to wash your hair, wash first with your normal shampoo.  After you use shampoo/soap, rinse your hair and body thoroughly to remove shampoo/soap residue.  Turn the water  OFF and apply about 3 tablespoons (45 ml) of CHG soap to a CLEAN washcloth.  Apply CHG soap ONLY FROM YOUR NECK DOWN TO YOUR TOES (washing for 3-5 minutes)  DO NOT use CHG soap on face, private areas, open wounds, or sores.  Pay special attention to the area where your surgery is being performed.  If you are having back surgery, having someone wash your back for you may be helpful. Wait 2 minutes after CHG soap is applied, then you may rinse off the CHG soap.  Pat dry with a clean towel  Put on clean clothes/pajamas   If you choose to wear lotion, please use ONLY the CHG-compatible lotions that are listed below.  Additional instructions for the day of surgery: If you choose, TAKE A SHOWER WITH YOUR CHG SOAP. DO NOT APPLY any lotions, deodorants, cologne, or perfumes.   Do not bring valuables to the hospital. [facility] is not responsible for any belongings/valuables. Do not wear nail polish, gel polish, artificial nails, or any other type of covering on natural nails (fingers and toes) Do not wear jewelry or makeup Put on clean/comfortable clothes.  Please brush your teeth.  Ask your nurse before applying any prescription medications to  the skin.     CHG Compatible Lotions   Aveeno Moisturizing lotion  Cetaphil Moisturizing Cream  Cetaphil Moisturizing Lotion  Clairol Herbal Essence Moisturizing Lotion, Dry Skin  Clairol Herbal Essence Moisturizing Lotion, Extra Dry Skin  Clairol Herbal Essence Moisturizing Lotion, Normal Skin  Curel Age Defying Therapeutic Moisturizing Lotion with Alpha Hydroxy  Curel Extreme Care Body Lotion  Curel Soothing Hands Moisturizing Hand Lotion  Curel Therapeutic Moisturizing Cream, Fragrance-Free  Curel Therapeutic Moisturizing Lotion, Fragrance-Free  Curel Therapeutic Moisturizing Lotion, Original Formula  Eucerin Daily Replenishing Lotion  Eucerin Dry Skin Therapy Plus Alpha Hydroxy Crme  Eucerin Dry Skin Therapy Plus Alpha Hydroxy Lotion  Eucerin Original Crme  Eucerin Original Lotion  Eucerin Plus Crme Eucerin Plus Lotion  Eucerin TriLipid Replenishing Lotion  Keri Anti-Bacterial Hand Lotion  Keri Deep Conditioning Original Lotion Dry Skin Formula Softly  Scented  Keri Deep Conditioning Original Lotion, Fragrance Free Sensitive Skin Formula  Keri Lotion Fast Absorbing Fragrance Free Sensitive Skin Formula  Keri Lotion Fast Absorbing Softly Scented Dry Skin Formula  Keri Original Lotion  Keri Skin Renewal Lotion Keri Silky Smooth Lotion  Keri Silky Smooth Sensitive Skin Lotion  Nivea Body Creamy Conditioning Oil  Nivea Body Extra Enriched Lotion  Nivea Body Original Lotion  Nivea Body Sheer Moisturizing Lotion Nivea Crme  Nivea Skin Firming Lotion  NutraDerm 30 Skin Lotion  NutraDerm Skin Lotion  NutraDerm Therapeutic Skin Cream  NutraDerm Therapeutic Skin Lotion  ProShield Protective Hand Cream  Provon moisturizing lotion  Please read over the following fact sheets that you were given.

## 2024-02-17 ENCOUNTER — Other Ambulatory Visit: Payer: Self-pay

## 2024-02-17 ENCOUNTER — Encounter (HOSPITAL_COMMUNITY)
Admission: RE | Admit: 2024-02-17 | Discharge: 2024-02-17 | Disposition: A | Discharge status: Home / Self Care | Source: Ambulatory Visit | Attending: Orthopaedic Surgery | Admitting: Orthopaedic Surgery

## 2024-02-17 ENCOUNTER — Encounter (HOSPITAL_COMMUNITY): Payer: Self-pay

## 2024-02-17 DIAGNOSIS — I2510 Atherosclerotic heart disease of native coronary artery without angina pectoris: Secondary | ICD-10-CM | POA: Diagnosis not present

## 2024-02-17 DIAGNOSIS — Z79899 Other long term (current) drug therapy: Secondary | ICD-10-CM | POA: Diagnosis not present

## 2024-02-17 DIAGNOSIS — Z01812 Encounter for preprocedural laboratory examination: Secondary | ICD-10-CM | POA: Insufficient documentation

## 2024-02-17 DIAGNOSIS — K219 Gastro-esophageal reflux disease without esophagitis: Secondary | ICD-10-CM | POA: Insufficient documentation

## 2024-02-17 DIAGNOSIS — I110 Hypertensive heart disease with heart failure: Secondary | ICD-10-CM | POA: Diagnosis not present

## 2024-02-17 DIAGNOSIS — I5032 Chronic diastolic (congestive) heart failure: Secondary | ICD-10-CM | POA: Insufficient documentation

## 2024-02-17 DIAGNOSIS — Z955 Presence of coronary angioplasty implant and graft: Secondary | ICD-10-CM | POA: Diagnosis not present

## 2024-02-17 DIAGNOSIS — D649 Anemia, unspecified: Secondary | ICD-10-CM | POA: Diagnosis not present

## 2024-02-17 DIAGNOSIS — E871 Hypo-osmolality and hyponatremia: Secondary | ICD-10-CM | POA: Insufficient documentation

## 2024-02-17 DIAGNOSIS — Z01818 Encounter for other preprocedural examination: Secondary | ICD-10-CM

## 2024-02-17 HISTORY — DX: Cardiac arrhythmia, unspecified: I49.9

## 2024-02-17 LAB — TYPE AND SCREEN
ABO/RH(D): O POS
Antibody Screen: NEGATIVE

## 2024-02-17 LAB — BASIC METABOLIC PANEL WITH GFR
Anion gap: 9 (ref 5–15)
BUN: 11 mg/dL (ref 8–23)
CO2: 26 mmol/L (ref 22–32)
Calcium: 9.1 mg/dL (ref 8.9–10.3)
Chloride: 97 mmol/L — ABNORMAL LOW (ref 98–111)
Creatinine, Ser: 0.78 mg/dL (ref 0.44–1.00)
GFR, Estimated: >60 mL/min (ref >60)
Glucose, Bld: 104 mg/dL — ABNORMAL HIGH (ref 70–99)
Potassium: 4 mmol/L (ref 3.5–5.1)
Sodium: 132 mmol/L — ABNORMAL LOW (ref 135–145)

## 2024-02-17 LAB — CBC
HCT: 36.3 % (ref 36.0–46.0)
Hemoglobin: 11.8 g/dL — ABNORMAL LOW (ref 12.0–15.0)
MCH: 32.1 pg (ref 26.0–34.0)
MCHC: 32.5 g/dL (ref 30.0–36.0)
MCV: 98.6 fL (ref 80.0–100.0)
Platelets: 411 K/uL — ABNORMAL HIGH (ref 150–400)
RBC: 3.68 MIL/uL — ABNORMAL LOW (ref 3.87–5.11)
RDW: 16.3 % — ABNORMAL HIGH (ref 11.5–15.5)
WBC: 8.9 K/uL (ref 4.0–10.5)
nRBC: 0 % (ref 0.0–0.2)

## 2024-02-17 LAB — SURGICAL PCR SCREEN
MRSA, PCR: NEGATIVE
Staphylococcus aureus: NEGATIVE

## 2024-02-17 NOTE — Progress Notes (Signed)
 PCP - Glade Geofm COME Cardiologist - Bridgette Christopher,MD  PPM/ICD - denies Device Orders -  Rep Notified -   Chest x-ray - 11/15/23 EKG - 11/15/23 Stress Test - 06/01/16 ECHO - 05/30/23 Cardiac Cath - denies  Sleep Study - denies CPAP - no  Fasting Blood Sugar - na Checks Blood Sugar _____ times a day  Last dose of GLP1 agonist-  na GLP1 instructions:   Blood Thinner Instructions:Per your Doctor, STOP your ELIQUIS  3 days prior to surgery. Your last dose should be on Sunday, 02/19/24.  Aspirin Instructions:na  ERAS Protcol -clears until 0545 PRE-SURGERY Ensure or G2- Ensure  COVID TEST- na   Anesthesia review: yes- history of afib,MI,CAD CAD,PTCA. Cardiac clearance 02/16/24  Patient denies shortness of breath, fever, cough and chest pain at PAT appointment   All instructions explained to the patient, with a verbal understanding of the material. Patient agrees to go over the instructions while at home for a better understanding. The opportunity to ask questions was provided.

## 2024-02-20 ENCOUNTER — Ambulatory Visit: Admitting: Internal Medicine

## 2024-02-20 ENCOUNTER — Encounter: Payer: Self-pay | Admitting: Internal Medicine

## 2024-02-20 ENCOUNTER — Encounter: Payer: Self-pay | Admitting: Orthopaedic Surgery

## 2024-02-20 VITALS — BP 140/70 | HR 61 | Temp 97.6°F | Ht 63.0 in | Wt 147.8 lb

## 2024-02-20 DIAGNOSIS — K219 Gastro-esophageal reflux disease without esophagitis: Secondary | ICD-10-CM

## 2024-02-20 DIAGNOSIS — I10 Essential (primary) hypertension: Secondary | ICD-10-CM | POA: Diagnosis not present

## 2024-02-20 DIAGNOSIS — Z23 Encounter for immunization: Secondary | ICD-10-CM

## 2024-02-20 DIAGNOSIS — I4891 Unspecified atrial fibrillation: Secondary | ICD-10-CM

## 2024-02-20 DIAGNOSIS — E7849 Other hyperlipidemia: Secondary | ICD-10-CM

## 2024-02-20 DIAGNOSIS — M810 Age-related osteoporosis without current pathological fracture: Secondary | ICD-10-CM | POA: Diagnosis not present

## 2024-02-20 DIAGNOSIS — I2510 Atherosclerotic heart disease of native coronary artery without angina pectoris: Secondary | ICD-10-CM

## 2024-02-20 DIAGNOSIS — R7303 Prediabetes: Secondary | ICD-10-CM | POA: Diagnosis not present

## 2024-02-20 NOTE — Assessment & Plan Note (Signed)
 Chronic Lab Results  Component Value Date   HGBA1C 5.4 11/15/2023   We will hold off on checking A1c and check in 6 months since she just had blood work done and I do not want her to have to blood work at this point-sugars have been well-controlled and I believe she is low risk Low sugar / carb diet Stressed regular exercise once able

## 2024-02-20 NOTE — Assessment & Plan Note (Signed)
 Chronic LDL have been at goal Continue Crestor 10 mg daily  Lab Results  Component Value Date   LDLCALC 54 01/28/2023

## 2024-02-20 NOTE — Progress Notes (Signed)
 Anesthesia Chart Review:  78 year old female follows with cardiology for history of paroxysmal atrial fibrillation on Eliquis , HFmrEF, diastolic heart failure, HTN, HLD, CAD s/p PCI 2008.  Seen by Jackee Alberts, NP on 02/16/2024 for preop evaluation.  Echo 05/2023 showed LVEF 45 to 50%, normal RV, no significant valvular abnormalities.  Per note,   Preoperative Cardiovascular Risk Assessment: - Patient's RCRI score is 6.6%. The patient affirms she has been doing well without any new cardiac symptoms. They are able to achieve 5 METS without cardiac limitations. Therefore, based on ACC/AHA guidelines, the patient would be at acceptable risk for the planned procedure without further cardiovascular testing. The patient was advised that if she develops new symptoms prior to surgery to contact our office to arrange for a follow-up visit, and she verbalized understanding. Per office protocol, patient can hold Eliquis  for 3 days prior to procedure.  Patient will not need bridging with Lovenox (enoxaparin) around procedure.  Other pertinent history includes GERD on PPI, left breast cancer s/p lumpectomy and XRT.  Preop labs reviewed, mild hyponatremia sodium 132, mild anemia hemoglobin 11.8, otherwise unremarkable.  EKG 11/15/2023: Normal sinus rhythm.  Rate 81. Minimal voltage criteria for LVH, may be normal variant ( Cornell product ). Possible Inferior infarct , age undetermined. Anterolateral infarct , age undetermined  TTE 05/30/2023:  1. Unchanged apical akinesis. Left ventricular ejection fraction, by  estimation, is 45 to 50%. The left ventricle has mildly decreased  function. There is mild concentric left ventricular hypertrophy. Left  ventricular diastolic parameters are  indeterminate.   2. Right ventricular systolic function is normal. The right ventricular  size is normal.   3. Left atrial size was moderately dilated.   4. The mitral valve is normal in structure. No evidence of mitral valve   regurgitation.   5. The aortic valve is tricuspid. Aortic valve regurgitation is not  visualized.   6. The inferior vena cava is normal in size with greater than 50%  respiratory variability, suggesting right atrial pressure of 3 mmHg.   Exercise tolerance test 06/01/2016: The patient walked for 6:16 of a Bruce protocol. Peak HR of 141 which is 93% predicted maxmial HR. There were no ST or T wave changes to suggest ischemia. BP response to exercise was normal.    Lynwood Geofm RIGGERS [facility]/Anesthesiology Phone [phone] 02/20/2024 4:03 PM

## 2024-02-20 NOTE — Assessment & Plan Note (Signed)
 Chronic DEXA up to date Intolerant to alendronate and Evista in the past Discussed high risk of fracture-she did have a recent fracture Was going to start prolia  but declined for now okay About to have knee replaced so we will revisit this in 6 months Continue calcium  and vitamin D  supplementation

## 2024-02-20 NOTE — Patient Instructions (Addendum)
     Flu immunization administered today.       Medications changes include :   None      Return in about 6 months (around 08/20/2024) for Physical Exam.

## 2024-02-20 NOTE — Assessment & Plan Note (Signed)
 Chronic Following with cardiology s/p cardioversion On Eliquis  5 mg twice daily and propranolol  80 mg twice daily H/H stable-concern for small intestine bleed-following with GI

## 2024-02-20 NOTE — Assessment & Plan Note (Signed)
 Chronic Intermittent-typically based on what she eats Encouraged GERD diet Continue pantoprazole  20 mg daily as needed

## 2024-02-20 NOTE — Progress Notes (Signed)
 Subjective:    Patient ID: Sarah Hendricks, female    DOB: [birth year], 78 y.o.   MRN: [id number]     HPI Sarah Hendricks is here for follow up of her chronic medical problems.  Right knee pain - TKR scheduled for Thursday-she is in a lot of pain and that is why she feels her blood pressure is likely elevated  Other than that she is doing fairly well.  Medications and allergies reviewed with patient and updated if appropriate.  Current Outpatient Medications on File Prior to Visit  Medication Sig Dispense Refill   apixaban  (ELIQUIS ) 5 MG TABS tablet Take 1 tablet (5 mg total) by mouth 2 (two) times daily. 180 tablet 1   Calcium -Magnesium-Zinc (CAL-MAG-ZINC PO) Take 1 tablet by mouth every other day.     Cholecalciferol  (EQL VITAMIN D3) 1000 units tablet Take 1 tablet (1,000 Units total) by mouth daily.     escitalopram  (LEXAPRO ) 10 MG tablet TAKE 1 TABLET(10 MG) BY MOUTH DAILY (Patient taking differently: Take 10 mg by mouth daily as needed (anxiety).) 90 tablet 1   ferrous sulfate  325 (65 FE) MG EC tablet Take 1 tablet (325 mg total) by mouth 2 (two) times daily. 60 tablet 3   furosemide  (LASIX ) 20 MG tablet Take one tablet 4 times per week (Patient taking differently: Take 20 mg by mouth 4 (four) times a week. Sundays, Mondays, Wednesdays & Fridays.) 48 tablet 3   losartan  (COZAAR ) 100 MG tablet TAKE 1 TABLET(100 MG) BY MOUTH DAILY 90 tablet 2   pantoprazole  (PROTONIX ) 20 MG tablet Take 1 tablet (20 mg total) by mouth daily as needed for heartburn. 90 tablet 1   propranolol  (INDERAL ) 80 MG tablet Take 1 tablet (80 mg total) by mouth 2 (two) times daily. 180 tablet 3   rosuvastatin  (CRESTOR ) 10 MG tablet Take 1 tablet (10 mg total) by mouth daily. 90 tablet 2   traMADol  (ULTRAM ) 50 MG tablet Take 1-2 tablets (50-100 mg total) by mouth every 12 (twelve) hours as needed. (Patient not taking: Reported on 02/20/2024) 30 tablet 0   Current Facility-Administered Medications on File Prior to  Visit  Medication Dose Route Frequency Provider Last Rate Last Admin   denosumab  (PROLIA ) injection 60 mg  60 mg Subcutaneous Once          Review of Systems  Constitutional:  Negative for chills and fever.  Respiratory:  Negative for cough, shortness of breath and wheezing.   Cardiovascular:  Negative for chest pain, palpitations and leg swelling.  Gastrointestinal:  Negative for constipation.  Neurological:  Negative for light-headedness.       Objective:   Vitals:   02/20/24 1104 02/20/24 1138  BP: (!) 160/70 (!) 140/70  Pulse: 61   Temp: 97.6 F (36.4 C)   SpO2: 97%    BP Readings from Last 3 Encounters:  02/20/24 (!) 140/70  02/17/24 131/61  02/14/24 120/78   Wt Readings from Last 3 Encounters:  02/20/24 147 lb 12.8 oz (67 kg)  02/17/24 147 lb (66.7 kg)  02/14/24 147 lb 12.8 oz (67 kg)   Body mass index is 26.18 kg/m.    Physical Exam Constitutional:      General: She is not in acute distress.    Appearance: Normal appearance.  HENT:     Head: Normocephalic and atraumatic.  Eyes:     Conjunctiva/sclera: Conjunctivae normal.  Cardiovascular:     Rate and Rhythm: Normal rate and regular rhythm.  Heart sounds: Normal heart sounds.  Pulmonary:     Effort: Pulmonary effort is normal. No respiratory distress.     Breath sounds: Normal breath sounds. No wheezing.  Musculoskeletal:     Cervical back: Neck supple.     Right lower leg: No edema.     Left lower leg: No edema.  Lymphadenopathy:     Cervical: No cervical adenopathy.  Skin:    General: Skin is warm and dry.     Findings: No rash.  Neurological:     Mental Status: She is alert. Mental status is at baseline.  Psychiatric:        Mood and Affect: Mood normal.        Behavior: Behavior normal.        Lab Results  Component Value Date   WBC 8.9 02/17/2024   HGB 11.8 (L) 02/17/2024   HCT 36.3 02/17/2024   PLT 411 (H) 02/17/2024   GLUCOSE 104 (H) 02/17/2024   CHOL 137 01/28/2023    TRIG 162.0 (H) 01/28/2023   HDL 50.60 01/28/2023   LDLDIRECT 87.0 06/12/2018   LDLCALC 54 01/28/2023   ALT 15 11/15/2023   AST 40 11/15/2023   NA 132 (L) 02/17/2024   K 4.0 02/17/2024   CL 97 (L) 02/17/2024   CREATININE 0.78 02/17/2024   BUN 11 02/17/2024   CO2 26 02/17/2024   TSH 1.262 05/30/2023   INR 1.3 (H) 11/15/2023   HGBA1C 5.4 11/15/2023     Assessment & Plan:    See Problem List for Assessment and Plan of chronic medical problems.   Flu immunization administered today.

## 2024-02-20 NOTE — Assessment & Plan Note (Signed)
 Chronic Blood pressure elevated today -  likely related to pain Will hold off on blood work Continue losartan  100 mg daily, propranolol  80 mg twice daily

## 2024-02-20 NOTE — Assessment & Plan Note (Signed)
 Chronic History of stent following MI in 2008 while living in [place]  Continue aspirin 81 mg daily, Crestor  10 mg daily Following with cardiology

## 2024-02-20 NOTE — Anesthesia Preprocedure Evaluation (Signed)
 Anesthesia Evaluation  Patient identified by MRN, date of birth, ID band Patient awake    Reviewed: Allergy & Precautions, NPO status , Patient's Chart, lab work & pertinent test results, reviewed documented beta blocker date and time   History of Anesthesia Complications Negative for: history of anesthetic complications  Airway Mallampati: II  TM Distance: >3 FB Neck ROM: Full    Dental  (+) Dental Advisory Given   Pulmonary neg pulmonary ROS   Pulmonary exam normal        Cardiovascular hypertension, Pt. on medications and Pt. on home beta blockers + CAD, + Past MI, + Cardiac Stents and +CHF  Normal cardiovascular exam+ dysrhythmias Atrial Fibrillation    '25 TTE - Unchanged apical akinesis. EF 45 to 50%. There is mild concentric left ventricular hypertrophy. Left atrial size was moderately dilated.     Neuro/Psych  PSYCHIATRIC DISORDERS Anxiety     negative neurological ROS     GI/Hepatic Neg liver ROS,GERD  Controlled,,  Endo/Other   Na 132   Renal/GU negative Renal ROS     Musculoskeletal  (+) Arthritis ,    Abdominal   Peds  Hematology  (+) Blood dyscrasia, anemia  On eliquis  Plt 411k    Anesthesia Other Findings   Reproductive/Obstetrics                              Anesthesia Physical Anesthesia Plan  ASA: 3  Anesthesia Plan: Spinal   Post-op Pain Management: Regional block*   Induction:   PONV Risk Score and Plan: 2 and Treatment may vary due to age or medical condition and Propofol  infusion  Airway Management Planned: Natural Airway and Simple Face Mask  Additional Equipment: None  Intra-op Plan:   Post-operative Plan:   Informed Consent: I have reviewed the patients History and Physical, chart, labs and discussed the procedure including the risks, benefits and alternatives for the proposed anesthesia with the patient or authorized representative who has  indicated his/her understanding and acceptance.       Plan Discussed with: CRNA and Anesthesiologist  Anesthesia Plan Comments: (Labs reviewed, platelets acceptable. Discussed risks and benefits of spinal, including spinal/epidural hematoma, infection, failed block, and PDPH. Patient expressed understanding and wished to proceed.  PAT note by Lynwood Hope, PA-C)         Anesthesia Quick Evaluation

## 2024-02-22 NOTE — H&P (Signed)
 TOTAL KNEE ADMISSION H&P  Patient is being admitted for right total knee arthroplasty.  Subjective:  Chief Complaint:right knee pain.  HPI: Sarah Hendricks, 78 y.o. female, has a history of pain and functional disability in the right knee due to arthritis and has failed non-surgical conservative treatments for greater than 12 weeks to includeNSAID's and/or analgesics, corticosteriod injections, and activity modification.  Onset of symptoms was gradual, starting a few years ago with gradually worsening course since that time. The patient noted no past surgery on the right knee(s).  Patient currently rates pain in the right knee(s) at 10 out of 10 with activity. Patient has night pain, worsening of pain with activity and weight bearing, pain that interferes with activities of daily living, pain with passive range of motion, crepitus, and joint swelling.  Patient has evidence of subchondral sclerosis, periarticular osteophytes, and joint space narrowing by imaging studies. There is no active infection.  Patient Active Problem List   Diagnosis Date Noted   Unilateral primary osteoarthritis, right knee 01/04/2024   Abdominal pain 12/05/2023   Heme positive stool 11/16/2023   Anticoagulated 11/16/2023   Anemia 11/15/2023   DOE (dyspnea on exertion) 11/14/2023   Bimalleolar ankle fracture, right, closed, initial encounter 06/20/2023   Atrial fibrillation (HCC) 05/30/2023   Closed fracture of right ankle 05/30/2023   Fall, initial encounter 05/29/2023   Localized swelling of left lower leg 03/02/2023   Left hip pain 05/25/2022   Right knee pain 05/25/2022   Left leg pain 05/25/2022   Hyponatremia 02/15/2022   Stress and adjustment reaction 01/04/2020   Prediabetes 05/26/2017   Osteoporosis 05/30/2014   CAD (coronary artery disease)    Hypertension    Hyperlipidemia    History of breast cancer    GERD (gastroesophageal reflux disease)    Vitamin D  deficiency    Past Medical History:   Diagnosis Date   Allergy    Anxiety    Arthritis    Basal cell carcinoma (BCC) of left side of nose 2008   Bimalleolar ankle fracture, right, closed, initial encounter    Breast cancer (HCC) 1994   lumpectomy and XRT, no chemo   CAD (coronary artery disease) 2008   PTCA, in [place]   Dysrhythmia    Atrial fibrillation   GERD (gastroesophageal reflux disease)    History of chicken pox    Hyperlipidemia    Hypertension    Myocardial infarction (HCC) 03/15/2007   stent placed   Vitamin D  deficiency     Past Surgical History:  Procedure Laterality Date   BREAST BIOPSY Left 1994   BREAST LUMPECTOMY Left 1994   CARDIOVERSION N/A 07/15/2023   Procedure: CARDIOVERSION;  Surgeon: Loni Soyla LABOR, MD;  Location: [facility];  Service: Cardiovascular;  Laterality: N/A;   CATARACT EXTRACTION, BILATERAL     COLONOSCOPY N/A 11/17/2023   Procedure: COLONOSCOPY;  Surgeon: Leigh Elspeth SQUIBB, MD;  Location: [facility];  Service: Gastroenterology;  Laterality: N/A;   CORONARY ANGIOPLASTY WITH STENT PLACEMENT  2008   CRANIECTOMY FOR DEPRESSED SKULL FRACTURE  1986   ENTEROSCOPY N/A 11/16/2023   Procedure: ENTEROSCOPY;  Surgeon: Leigh Elspeth SQUIBB, MD;  Location: [facility] ENDOSCOPY;  Service: Gastroenterology;  Laterality: N/A;   ORIF ANKLE FRACTURE Right 06/20/2023   Procedure: RIGHT OPEN REDUCTION INTERNAL FIXATION (ORIF) ANKLE FRACTURE;  Surgeon: Genelle Elspeth, MD;  Location: [facility];  Service: Orthopedics;  Laterality: Right;   TONSILLECTOMY  1966   TUBAL LIGATION  Current Facility-Administered Medications  Medication Dose Route Frequency Provider Last Rate Last Admin   denosumab  (PROLIA ) injection 60 mg  60 mg Subcutaneous Once        Current Outpatient Medications  Medication Sig Dispense Refill Last Dose/Taking   apixaban  (ELIQUIS ) 5 MG TABS tablet Take 1 tablet (5 mg total) by mouth 2 (two) times daily. 180 tablet 1 Taking   Calcium -Magnesium-Zinc  (CAL-MAG-ZINC PO) Take 1 tablet by mouth every other day.   Taking   Cholecalciferol  (EQL VITAMIN D3) 1000 units tablet Take 1 tablet (1,000 Units total) by mouth daily.   Taking   escitalopram  (LEXAPRO ) 10 MG tablet TAKE 1 TABLET(10 MG) BY MOUTH DAILY (Patient taking differently: Take 10 mg by mouth daily as needed (anxiety).) 90 tablet 1 Taking Differently   ferrous sulfate  325 (65 FE) MG EC tablet Take 1 tablet (325 mg total) by mouth 2 (two) times daily. 60 tablet 3 Taking   furosemide  (LASIX ) 20 MG tablet Take one tablet 4 times per week (Patient taking differently: Take 20 mg by mouth 4 (four) times a week. Sundays, Mondays, Wednesdays & Fridays.) 48 tablet 3 Taking Differently   losartan  (COZAAR ) 100 MG tablet TAKE 1 TABLET(100 MG) BY MOUTH DAILY 90 tablet 2 Taking   pantoprazole  (PROTONIX ) 20 MG tablet Take 1 tablet (20 mg total) by mouth daily as needed for heartburn. 90 tablet 1 Taking As Needed   propranolol  (INDERAL ) 80 MG tablet Take 1 tablet (80 mg total) by mouth 2 (two) times daily. 180 tablet 3 Taking   rosuvastatin  (CRESTOR ) 10 MG tablet Take 1 tablet (10 mg total) by mouth daily. 90 tablet 2 Taking   traMADol  (ULTRAM ) 50 MG tablet Take 1-2 tablets (50-100 mg total) by mouth every 12 (twelve) hours as needed. (Patient not taking: Reported on 02/20/2024) 30 tablet 0    Allergies  Allergen Reactions   Acetaminophen -Codeine Other (See Comments)    Bad dreams   Tigan [Trimethobenzamide] Other (See Comments)    Bad dreams   Atorvastatin  Other (See Comments)    Pt reports causes bilateral leg pains.   Codeine     Bad dreams   Fosamax [Alendronate] Other (See Comments)    Caused leg pains    Nsaids Other (See Comments)    History of GI bleeds   Simvastatin Other (See Comments)    Leg cramps    Social History   Tobacco Use   Smoking status: Never    Passive exposure: Never   Smokeless tobacco: Never  Substance Use Topics   Alcohol use: No    Family History  Problem  Relation Age of Onset   Breast cancer Mother    Hypertension Mother    Heart disease Mother    Stroke Mother    Arthritis Father    Heart disease Father        died at 57 of heart attack   Heart disease Brother    Stroke Maternal Grandmother    Heart disease Maternal Grandfather    Colon cancer Neg Hx    Esophageal cancer Neg Hx    Rectal cancer Neg Hx    Stomach cancer Neg Hx      Review of Systems  Objective:  Physical Exam Vitals reviewed.  Constitutional:      Appearance: Normal appearance. She is normal weight.  HENT:     Head: Normocephalic and atraumatic.  Eyes:     Extraocular Movements: Extraocular movements intact.     Pupils: Pupils  are equal, round, and reactive to light.  Cardiovascular:     Rate and Rhythm: Normal rate and regular rhythm.  Pulmonary:     Effort: Pulmonary effort is normal.     Breath sounds: Normal breath sounds.  Abdominal:     Palpations: Abdomen is soft.  Musculoskeletal:     Cervical back: Normal range of motion and neck supple.     Right knee: Effusion, bony tenderness and crepitus present. Decreased range of motion. Tenderness present over the medial joint line and lateral joint line. Abnormal alignment.  Neurological:     Mental Status: She is alert and oriented to person, place, and time.  Psychiatric:        Behavior: Behavior normal.     Vital signs in last 24 hours:    Labs:   Estimated body mass index is 26.18 kg/m as calculated from the following:   Height as of 02/20/24: 5' 3 (1.6 m).   Weight as of 02/20/24: 67 kg.   Imaging Review Plain radiographs demonstrate severe degenerative joint disease of the right knee(s). The overall alignment ismild varus. The bone quality appears to be good for age and reported activity level.      Assessment/Plan:  End stage arthritis, right knee   The patient history, physical examination, clinical judgment of the provider and imaging studies are consistent with end stage  degenerative joint disease of the right knee(s) and total knee arthroplasty is deemed medically necessary. The treatment options including medical management, injection therapy arthroscopy and arthroplasty were discussed at length. The risks and benefits of total knee arthroplasty were presented and reviewed. The risks due to aseptic loosening, infection, stiffness, patella tracking problems, thromboembolic complications and other imponderables were discussed. The patient acknowledged the explanation, agreed to proceed with the plan and consent was signed. Patient is being admitted for inpatient treatment for surgery, pain control, PT, OT, prophylactic antibiotics, VTE prophylaxis, progressive ambulation and ADL's and discharge planning. The patient is planning to be discharged home with home health services

## 2024-02-23 ENCOUNTER — Ambulatory Visit (HOSPITAL_COMMUNITY): Payer: Self-pay | Admitting: Physician Assistant

## 2024-02-23 ENCOUNTER — Other Ambulatory Visit: Payer: Self-pay

## 2024-02-23 ENCOUNTER — Encounter (HOSPITAL_COMMUNITY): Payer: Self-pay | Admitting: Orthopaedic Surgery

## 2024-02-23 ENCOUNTER — Observation Stay (HOSPITAL_COMMUNITY)

## 2024-02-23 ENCOUNTER — Observation Stay (HOSPITAL_COMMUNITY)
Admission: RE | Admit: 2024-02-23 | Discharge: 2024-02-24 | Disposition: A | Discharge status: Home Health | Attending: Orthopaedic Surgery | Admitting: Orthopaedic Surgery

## 2024-02-23 ENCOUNTER — Ambulatory Visit (HOSPITAL_COMMUNITY): Payer: Self-pay | Admitting: Anesthesiology

## 2024-02-23 ENCOUNTER — Encounter (HOSPITAL_COMMUNITY)
Admission: RE | Disposition: A | Discharge status: Home Health | Payer: Self-pay | Source: Home / Self Care | Attending: Orthopaedic Surgery

## 2024-02-23 DIAGNOSIS — Z96651 Presence of right artificial knee joint: Secondary | ICD-10-CM

## 2024-02-23 DIAGNOSIS — Z955 Presence of coronary angioplasty implant and graft: Secondary | ICD-10-CM | POA: Insufficient documentation

## 2024-02-23 DIAGNOSIS — Z853 Personal history of malignant neoplasm of breast: Secondary | ICD-10-CM | POA: Insufficient documentation

## 2024-02-23 DIAGNOSIS — Z79899 Other long term (current) drug therapy: Secondary | ICD-10-CM | POA: Diagnosis not present

## 2024-02-23 DIAGNOSIS — I110 Hypertensive heart disease with heart failure: Secondary | ICD-10-CM | POA: Insufficient documentation

## 2024-02-23 DIAGNOSIS — M25561 Pain in right knee: Secondary | ICD-10-CM | POA: Diagnosis not present

## 2024-02-23 DIAGNOSIS — I2510 Atherosclerotic heart disease of native coronary artery without angina pectoris: Secondary | ICD-10-CM | POA: Diagnosis not present

## 2024-02-23 DIAGNOSIS — G8918 Other acute postprocedural pain: Secondary | ICD-10-CM | POA: Diagnosis not present

## 2024-02-23 DIAGNOSIS — M1711 Unilateral primary osteoarthritis, right knee: Secondary | ICD-10-CM

## 2024-02-23 DIAGNOSIS — I4891 Unspecified atrial fibrillation: Secondary | ICD-10-CM

## 2024-02-23 DIAGNOSIS — Z471 Aftercare following joint replacement surgery: Secondary | ICD-10-CM | POA: Diagnosis not present

## 2024-02-23 DIAGNOSIS — Z85828 Personal history of other malignant neoplasm of skin: Secondary | ICD-10-CM | POA: Insufficient documentation

## 2024-02-23 DIAGNOSIS — I10 Essential (primary) hypertension: Secondary | ICD-10-CM

## 2024-02-23 DIAGNOSIS — I509 Heart failure, unspecified: Secondary | ICD-10-CM | POA: Insufficient documentation

## 2024-02-23 DIAGNOSIS — R609 Edema, unspecified: Secondary | ICD-10-CM | POA: Diagnosis not present

## 2024-02-23 HISTORY — PX: TOTAL KNEE ARTHROPLASTY: SHX125

## 2024-02-23 SURGERY — ARTHROPLASTY, KNEE, TOTAL
Anesthesia: Spinal | Site: Knee | Laterality: Right

## 2024-02-23 MED ORDER — ONDANSETRON HCL 4 MG/2ML IJ SOLN: 4.0000 mg | Freq: Four times a day (QID) | INTRAMUSCULAR | Status: DC | PRN

## 2024-02-23 MED ORDER — FENTANYL CITRATE (PF) 100 MCG/2ML IJ SOLN
INTRAMUSCULAR | Status: AC
  Filled 2024-02-23: qty 2

## 2024-02-23 MED ORDER — MIDAZOLAM HCL 2 MG/2ML IJ SOLN
INTRAMUSCULAR | Status: AC
Start: 2024-02-23 — End: 2024-02-23
  Filled 2024-02-23: qty 2

## 2024-02-23 MED ORDER — ESCITALOPRAM OXALATE 10 MG PO TABS: 10.0000 mg | ORAL_TABLET | Freq: Every day | ORAL | Status: DC | PRN

## 2024-02-23 MED ORDER — SODIUM CHLORIDE 0.9 % IV SOLN: INTRAVENOUS | Status: DC

## 2024-02-23 MED ORDER — POVIDONE-IODINE 10 % EX SWAB
2.0000 | Freq: Once | CUTANEOUS | Status: AC
  Administered 2024-02-23: 2 via TOPICAL

## 2024-02-23 MED ORDER — CHLORHEXIDINE GLUCONATE 0.12 % MT SOLN
OROMUCOSAL | Status: AC
  Administered 2024-02-23: 15 mL via OROMUCOSAL
  Filled 2024-02-23: qty 15

## 2024-02-23 MED ORDER — PANTOPRAZOLE SODIUM 40 MG PO TBEC
40.0000 mg | DELAYED_RELEASE_TABLET | Freq: Every day | ORAL | Status: DC
  Administered 2024-02-23 – 2024-02-24 (×2): 40 mg via ORAL
  Filled 2024-02-23 (×2): qty 1

## 2024-02-23 MED ORDER — LACTATED RINGERS IV SOLN: INTRAVENOUS | Status: DC

## 2024-02-23 MED ORDER — PROPOFOL 500 MG/50ML IV EMUL
INTRAVENOUS | Status: DC | PRN
  Administered 2024-02-23: 50 ug/kg/min via INTRAVENOUS
  Administered 2024-02-23: 20 mg via INTRAVENOUS

## 2024-02-23 MED ORDER — ONDANSETRON HCL 4 MG/2ML IJ SOLN: 4.0000 mg | Freq: Once | INTRAMUSCULAR | Status: DC | PRN

## 2024-02-23 MED ORDER — BUPIVACAINE-EPINEPHRINE (PF) 0.25% -1:200000 IJ SOLN
INTRAMUSCULAR | Status: DC | PRN
  Administered 2024-02-23: 30 mL

## 2024-02-23 MED ORDER — ALUM & MAG HYDROXIDE-SIMETH 200-200-20 MG/5ML PO SUSP: 30.0000 mL | ORAL | Status: DC | PRN

## 2024-02-23 MED ORDER — METOCLOPRAMIDE HCL 5 MG/ML IJ SOLN: 5.0000 mg | Freq: Three times a day (TID) | INTRAMUSCULAR | Status: DC | PRN

## 2024-02-23 MED ORDER — VITAMIN D 25 MCG (1000 UNIT) PO TABS
1000.0000 [IU] | ORAL_TABLET | Freq: Every day | ORAL | Status: DC
  Administered 2024-02-23 – 2024-02-24 (×2): 1000 [IU] via ORAL
  Filled 2024-02-23 (×2): qty 1

## 2024-02-23 MED ORDER — TRANEXAMIC ACID-NACL 1000-0.7 MG/100ML-% IV SOLN
1000.0000 mg | INTRAVENOUS | Status: AC
  Administered 2024-02-23: 1000 mg via INTRAVENOUS

## 2024-02-23 MED ORDER — APIXABAN 5 MG PO TABS
5.0000 mg | ORAL_TABLET | Freq: Two times a day (BID) | ORAL | Status: DC
  Administered 2024-02-24: 5 mg via ORAL
  Filled 2024-02-23: qty 1

## 2024-02-23 MED ORDER — FENTANYL CITRATE (PF) 100 MCG/2ML IJ SOLN: 25.0000 ug | INTRAMUSCULAR | Status: DC | PRN

## 2024-02-23 MED ORDER — OXYCODONE HCL 5 MG PO TABS
10.0000 mg | ORAL_TABLET | ORAL | Status: DC | PRN
  Administered 2024-02-24: 10 mg via ORAL
  Filled 2024-02-23: qty 2

## 2024-02-23 MED ORDER — MENTHOL 3 MG MT LOZG: 1.0000 | LOZENGE | OROMUCOSAL | Status: DC | PRN

## 2024-02-23 MED ORDER — ONDANSETRON HCL 4 MG/2ML IJ SOLN
INTRAMUSCULAR | Status: DC | PRN
  Administered 2024-02-23: 4 mg via INTRAVENOUS

## 2024-02-23 MED ORDER — METOCLOPRAMIDE HCL 5 MG PO TABS: 5.0000 mg | ORAL_TABLET | Freq: Three times a day (TID) | ORAL | Status: DC | PRN

## 2024-02-23 MED ORDER — ACETAMINOPHEN 325 MG PO TABS: 325.0000 mg | ORAL_TABLET | Freq: Four times a day (QID) | ORAL | Status: DC | PRN

## 2024-02-23 MED ORDER — LOSARTAN POTASSIUM 50 MG PO TABS
100.0000 mg | ORAL_TABLET | Freq: Every day | ORAL | Status: DC
  Administered 2024-02-23 – 2024-02-24 (×2): 100 mg via ORAL
  Filled 2024-02-23 (×2): qty 2

## 2024-02-23 MED ORDER — FERROUS SULFATE 325 (65 FE) MG PO TABS
325.0000 mg | ORAL_TABLET | Freq: Two times a day (BID) | ORAL | Status: DC
  Administered 2024-02-23 – 2024-02-24 (×3): 325 mg via ORAL
  Filled 2024-02-23 (×3): qty 1

## 2024-02-23 MED ORDER — ROPIVACAINE HCL 7.5 MG/ML IJ SOLN
INTRAMUSCULAR | Status: DC | PRN
  Administered 2024-02-23: 20 mL via PERINEURAL

## 2024-02-23 MED ORDER — DOCUSATE SODIUM 100 MG PO CAPS
100.0000 mg | ORAL_CAPSULE | Freq: Two times a day (BID) | ORAL | Status: DC
  Administered 2024-02-23 – 2024-02-24 (×2): 100 mg via ORAL
  Filled 2024-02-23 (×2): qty 1

## 2024-02-23 MED ORDER — ROSUVASTATIN CALCIUM 5 MG PO TABS
10.0000 mg | ORAL_TABLET | Freq: Every day | ORAL | Status: DC
  Administered 2024-02-24: 10 mg via ORAL
  Filled 2024-02-23: qty 2

## 2024-02-23 MED ORDER — FENTANYL CITRATE PF 50 MCG/ML IJ SOSY
50.0000 ug | PREFILLED_SYRINGE | Freq: Once | INTRAMUSCULAR | Status: AC
  Administered 2024-02-23: 50 ug via INTRAVENOUS

## 2024-02-23 MED ORDER — CEFAZOLIN SODIUM-DEXTROSE 2-4 GM/100ML-% IV SOLN
INTRAVENOUS | Status: AC
  Filled 2024-02-23: qty 100

## 2024-02-23 MED ORDER — METHOCARBAMOL 1000 MG/10ML IJ SOLN: 500.0000 mg | Freq: Four times a day (QID) | INTRAMUSCULAR | Status: DC | PRN

## 2024-02-23 MED ORDER — CEFAZOLIN SODIUM-DEXTROSE 2-4 GM/100ML-% IV SOLN
2.0000 g | INTRAVENOUS | Status: AC
  Administered 2024-02-23: 2 g via INTRAVENOUS

## 2024-02-23 MED ORDER — OXYCODONE HCL 5 MG PO TABS: 5.0000 mg | ORAL_TABLET | Freq: Once | ORAL | Status: DC | PRN

## 2024-02-23 MED ORDER — BUPIVACAINE IN DEXTROSE 0.75-8.25 % IT SOLN
INTRATHECAL | Status: DC | PRN
Start: 2024-02-23 — End: 2024-02-23
  Administered 2024-02-23: 1.6 mL via INTRATHECAL

## 2024-02-23 MED ORDER — OXYCODONE HCL 5 MG/5ML PO SOLN: 5.0000 mg | Freq: Once | ORAL | Status: DC | PRN

## 2024-02-23 MED ORDER — METHOCARBAMOL 500 MG PO TABS: 500.0000 mg | ORAL_TABLET | Freq: Four times a day (QID) | ORAL | Status: DC | PRN

## 2024-02-23 MED ORDER — OXYCODONE HCL 5 MG PO TABS
ORAL_TABLET | ORAL | Status: AC
  Filled 2024-02-23: qty 2

## 2024-02-23 MED ORDER — DIPHENHYDRAMINE HCL 12.5 MG/5ML PO ELIX: 12.5000 mg | ORAL_SOLUTION | ORAL | Status: DC | PRN

## 2024-02-23 MED ORDER — OXYCODONE HCL 5 MG PO TABS
5.0000 mg | ORAL_TABLET | ORAL | Status: DC | PRN
  Administered 2024-02-23: 10 mg via ORAL

## 2024-02-23 MED ORDER — CEFAZOLIN SODIUM-DEXTROSE 2-4 GM/100ML-% IV SOLN
2.0000 g | Freq: Four times a day (QID) | INTRAVENOUS | Status: AC
  Administered 2024-02-23 (×2): 2 g via INTRAVENOUS
  Filled 2024-02-23 (×2): qty 100

## 2024-02-23 MED ORDER — HYDROMORPHONE HCL 1 MG/ML IJ SOLN: 0.5000 mg | INTRAMUSCULAR | Status: DC | PRN

## 2024-02-23 MED ORDER — ORAL CARE MOUTH RINSE: 15.0000 mL | Freq: Once | OROMUCOSAL | Status: AC

## 2024-02-23 MED ORDER — CHLORHEXIDINE GLUCONATE 0.12 % MT SOLN: 15.0000 mL | Freq: Once | OROMUCOSAL | Status: AC

## 2024-02-23 MED ORDER — BUPIVACAINE-EPINEPHRINE (PF) 0.25% -1:200000 IJ SOLN
INTRAMUSCULAR | Status: AC
  Filled 2024-02-23: qty 30

## 2024-02-23 MED ORDER — PROPRANOLOL HCL 20 MG PO TABS
80.0000 mg | ORAL_TABLET | Freq: Two times a day (BID) | ORAL | Status: DC
  Administered 2024-02-23 – 2024-02-24 (×2): 80 mg via ORAL
  Filled 2024-02-23 (×2): qty 4

## 2024-02-23 MED ORDER — PHENOL 1.4 % MT LIQD: 1.0000 | OROMUCOSAL | Status: DC | PRN

## 2024-02-23 MED ORDER — DEXAMETHASONE SODIUM PHOSPHATE 10 MG/ML IJ SOLN
INTRAMUSCULAR | Status: DC | PRN
  Administered 2024-02-23: 10 mg via INTRAVENOUS

## 2024-02-23 MED ORDER — TRANEXAMIC ACID-NACL 1000-0.7 MG/100ML-% IV SOLN
INTRAVENOUS | Status: AC
  Filled 2024-02-23: qty 100

## 2024-02-23 MED ORDER — ONDANSETRON HCL 4 MG PO TABS: 4.0000 mg | ORAL_TABLET | Freq: Four times a day (QID) | ORAL | Status: DC | PRN

## 2024-02-23 SURGICAL SUPPLY — 58 items
BAG COUNTER SPONGE SURGICOUNT (BAG) ×1 IMPLANT
BANDAGE ESMARK 6X9 LF (GAUZE/BANDAGES/DRESSINGS) ×1 IMPLANT
BLADE SAG 18X100X1.27 (BLADE) ×1 IMPLANT
BLADE SAW SGTL 11.0X1.19X90.0M (BLADE) IMPLANT
BNDG ELASTIC 6INX 5YD STR LF (GAUZE/BANDAGES/DRESSINGS) ×2 IMPLANT
BOWL SMART MIX CTS (DISPOSABLE) IMPLANT
CEMENT BONE R 1X40 (Cement) IMPLANT
COMPONENT FEM CR CEMT STD SZ5 (Joint) IMPLANT
COMPONENT TIB CMT PS D 0D RT (Joint) IMPLANT
COOLER ICEMAN CLASSIC (MISCELLANEOUS) ×1 IMPLANT
COVER SURGICAL LIGHT HANDLE (MISCELLANEOUS) ×1 IMPLANT
CUFF TOURN DUAL PORT 34 (TOURNIQUET CUFF) IMPLANT
CUFF TOURN SGL QUICK 42 (TOURNIQUET CUFF) IMPLANT
CUFF TRNQT CYL 34X4.125X (TOURNIQUET CUFF) ×1 IMPLANT
DRAPE EXTREMITY T 121X128X90 (DISPOSABLE) ×1 IMPLANT
DRAPE HALF SHEET 40X57 (DRAPES) ×1 IMPLANT
DRAPE U-SHAPE 47X51 STRL (DRAPES) ×1 IMPLANT
DURAPREP 26ML APPLICATOR (WOUND CARE) ×1 IMPLANT
ELECT CAUTERY BLADE 6.4 (BLADE) ×1 IMPLANT
ELECT PENCIL ROCKER SW 15FT (MISCELLANEOUS) IMPLANT
ELECTRODE REM PT RTRN 9FT ADLT (ELECTROSURGICAL) ×1 IMPLANT
FACESHIELD WRAPAROUND OR TEAM (MASK) ×2 IMPLANT
GAUZE PAD ABD 8X10 STRL (GAUZE/BANDAGES/DRESSINGS) ×1 IMPLANT
GAUZE SPONGE 4X4 12PLY STRL (GAUZE/BANDAGES/DRESSINGS) ×1 IMPLANT
GAUZE XEROFORM 1X8 LF (GAUZE/BANDAGES/DRESSINGS) ×1 IMPLANT
GLOVE BIOGEL PI IND STRL 8 (GLOVE) ×2 IMPLANT
GLOVE ORTHO TXT STRL SZ7.5 (GLOVE) ×1 IMPLANT
GLOVE SURG ORTHO 8.0 STRL STRW (GLOVE) ×1 IMPLANT
GOWN STRL REUS W/ TWL LRG LVL3 (GOWN DISPOSABLE) IMPLANT
GOWN STRL REUS W/ TWL XL LVL3 (GOWN DISPOSABLE) ×2 IMPLANT
IMMOBILIZER KNEE 22 UNIV (SOFTGOODS) ×1 IMPLANT
INSERT TIB AS PERS SZ CD 12 RT (Insert) IMPLANT
IV 0.9% NACL 1000 ML (IV SOLUTION) ×1 IMPLANT
KIT BASIN OR (CUSTOM PROCEDURE TRAY) ×1 IMPLANT
KIT TURNOVER KIT B (KITS) ×1 IMPLANT
MANIFOLD NEPTUNE II (INSTRUMENTS) ×1 IMPLANT
NDL 18GX1X1/2 (RX/OR ONLY) (NEEDLE) IMPLANT
NEEDLE 18GX1X1/2 (RX/OR ONLY) (NEEDLE) ×1 IMPLANT
PACK TOTAL JOINT (CUSTOM PROCEDURE TRAY) ×1 IMPLANT
PAD ARMBOARD POSITIONER FOAM (MISCELLANEOUS) ×1 IMPLANT
PAD COLD SHLDR WRAP-ON (PAD) ×1 IMPLANT
PADDING CAST COTTON 6X4 STRL (CAST SUPPLIES) ×1 IMPLANT
PIN DRILL HDLS TROCAR 75 4PK (PIN) IMPLANT
SCREW FEMALE HEX FIX 25X2.5 (ORTHOPEDIC DISPOSABLE SUPPLIES) IMPLANT
SET HNDPC FAN SPRY TIP SCT (DISPOSABLE) ×1 IMPLANT
SET PAD KNEE POSITIONER (MISCELLANEOUS) ×1 IMPLANT
SOLN 0.9% NACL 1000 ML (IV SOLUTION) ×1 IMPLANT
SOLN 0.9% NACL POUR BTL 1000ML (IV SOLUTION) ×1 IMPLANT
STAPLER SKIN PROX 35W (STAPLE) ×1 IMPLANT
STEM POLY PAT PLY 29M KNEE (Knees) IMPLANT
SUCTION TUBE FRAZIER 10FR DISP (SUCTIONS) ×1 IMPLANT
SUT VIC AB 0 CT1 27XBRD ANBCTR (SUTURE) ×1 IMPLANT
SUT VIC AB 1 CT1 27XBRD ANBCTR (SUTURE) ×2 IMPLANT
SUT VIC AB 2-0 CT1 TAPERPNT 27 (SUTURE) ×2 IMPLANT
SYR 50ML LL SCALE MARK (SYRINGE) IMPLANT
SYR CONTROL 10ML LL (SYRINGE) ×1 IMPLANT
TOWEL GREEN STERILE (TOWEL DISPOSABLE) ×1 IMPLANT
TOWEL GREEN STERILE FF (TOWEL DISPOSABLE) ×1 IMPLANT

## 2024-02-23 NOTE — Plan of Care (Signed)

## 2024-02-23 NOTE — Anesthesia Procedure Notes (Addendum)
 Anesthesia Regional Block: Adductor canal block   Pre-Anesthetic Checklist: , timeout performed,  Correct Patient, Correct Site, Correct Laterality,  Correct Procedure, Correct Position, site marked,  Risks and benefits discussed,  Surgical consent,  Pre-op evaluation,  At surgeon's request and post-op pain management  Laterality: Right  Prep: chloraprep       Needles:  Injection technique: Single-shot  Needle Type: Echogenic Needle     Needle Length: 10cm  Needle Gauge: 21     Additional Needles:   Narrative:  Start time: 02/23/2024 8:18 AM End time: 02/23/2024 8:21 AM Injection made incrementally with aspirations every 5 mL.  Performed by: Personally  Anesthesiologist: Lucious Debby BRAVO, MD  Additional Notes: No pain on injection. No increased resistance to injection. Injection made in 5cc increments. Good needle visualization. Patient tolerated the procedure well.

## 2024-02-23 NOTE — Op Note (Signed)
 Operative Note  Date of operation: 02/23/2024 Preoperative diagnosis: Right knee primary osteoarthritis Postoperative diagnosis: Same  Procedure: Right cemented total knee arthroplasty  Implants: Biomet/Zimmer persona cemented knee system Implant Name Type Inv. Item Serial No. Manufacturer Lot No. LRB No. Used Action  CEMENT BONE R 1X40 - [id number] Cement CEMENT BONE R 1X40  ZIMMER [employer]ON(ORTH,TRAU,BIO,SG) [id number] Right 1 Implanted  CEMENT BONE R 1X40 - [id number] Cement CEMENT BONE R 1X40  [employer]IMMER [employer]ON(ORTH,TRAU,BIO,SG) [id number] Right 1 Implanted  COMPONENT FEM CR CEMT STD SZ5 - [id number] Joint COMPONENT FEM CR CEMT STD SZ5  [employer]IMMER [employer]ON(ORTH,TRAU,BIO,SG) [id number] Right 1 Implanted  COMPONENT TIB CMT PS D 0D RT - [id number] Joint COMPONENT TIB CMT PS D 0D RT  [employer]IMMER [employer]ON(ORTH,TRAU,BIO,SG) [id number] Right 1 Implanted  INSERT TIB AS PERS SZ CD 12 RT - [id number] Insert INSERT TIB AS PERS SZ CD 12 RT  ZIMMER [employer]ON(ORTH,TRAU,BIO,SG) [id number] Right 1 Implanted  STEM POLY PAT PLY 24M KNEE - [id number] Knees STEM POLY PAT PLY 24M KNEE  ZIMMER [employer]ON(ORTH,TRAU,BIO,SG) [id number] Right 1 Implanted   Surgeon: Lonni GRADE. Vernetta, MD Assistant: April Green, RNFA  Anesthesia: #1 right lower extremity adductor canal block, #2 spinal, #3 local Tourniquet time: Under 1 hour EBL: Less than 50 cc Antibiotics: IV Ancef  Complications: None  Indications: The patient is an active 78 year old female with debilitating end-stage arthritis involving her right knee.  She has tried and failed conservative treatment for over a year now including multiple injections in the knee.  Her x-rays show bone-on-bone wear that knee with varus malalignment.  At this point her right knee pain is daily and it is detrimentally affecting her mobility, her quality of life and her actives daily living to the point she does wish to proceed with a knee replacement.  We did discuss in length in detail the risks of acute blood  loss anemia, nerve and vessel injury, fracture, infection, DVT, implant failure and wound healing issues.  She understands that our goals are hopefully decreased pain, improved mobility and improve quality of life.  Procedure description: After informed consent was obtained and appropriate right knee was marked, anesthesia obtained a right lower extremity adductor canal block in the holding room.  The patient was then brought to the operating room and set up in the stretcher where spinal anesthesia was obtained.  She was laid in spine position on stretcher and a Foley catheter was placed.  A nonsterile tractors placed around her upper right thigh and her right thigh, knee, leg and ankle were prepped and draped with DuraPrep and sterile drapes including a sterile stockinette.  A timeout was called and she was identified as the correct patient and the correct right knee.  An Esmarch was then used to wrap out the leg and the tourniquet was inflated to 300 mm pressure.  With the knee extended a direct midline incision was made over the patella and carried proximally distally.  Dissection was carried down to the knee joint and a medial parapatellar arthrotomy was made finding a large joint effusion.  With the knee in a flexed position we found complete cartilage loss of the medial compartment and the patellofemoral joint.  Remanence of the ACL and medial lateral meniscus were removed as well as osteophytes from all 3 compartments.  We then used the extramedullary based cutting guide for making our proximal tibia cut correction for varus and valgus and a 7 degree slope.  We made this cut to take 2 mm off the low side.  We then used an intramedullary based cutting guide through the notch of the knee for a distal femur cut setting this for right knee at 5 degrees external rotated and a 10 mm distal femoral cut.  We made that cut without difficulty and brought the knee back down to full extension and achieve full extension  with a 10 mm extension block.  We then backed the femur and put a femoral sizing guide based off the epicondylar axis and Whitesides line.  Based off of this we chose a size 5 femur.  We put a 4-in-1 cutting block for a size 5 femur and made our anterior and posterior cuts of our chamfer cuts.  We then went back to the tibia and chose a size D right tibial tray for coverage over the tibial plateau second rotation of the tibial tubercle and the femur.  We did our drill hole and keel punch off of this.  We then trialed our size D right tibia combined with our size 5 right CR standard femur.  We trialed up to a 12 mm thickness right medial congruent poly insert we replaced with range of motion and stability insert.  We then made a patella cut and drilled 3 holes for a size 29 patella button.  We then removed all trial instrumentation from the knee and irrigated the knee with normal saline solution.  We then placed Marcaine with epinephrine around the arthrotomy.  Next with the knee in a flexed position the cement was mixed and we cemented our Biomet/Zimmer persona tibial tray for right knee size D followed by cementing our size 5 right CR standard femur.  We cleaned excess cement debris from the knee and placed our 12 mm thickness right medial congruent polythene insert.  We also cemented our patella button size 29.  We held the knee completely extended and compressed while the cemented hardened.  Once the cemented hardened the finger was let down and hemostasis was obtained with electrocautery.  The arthrotomy was then closed with interrupted #1 Vicryl suture followed by 0 Vicryl close deep tissue and 2-0 Vicryl to close subcutaneous tissue.  The skin was closed with staples.  Well-padded sterile dressing was applied.  The patient was taken the recovery room.

## 2024-02-23 NOTE — Anesthesia Procedure Notes (Signed)
 Spinal  Patient location during procedure: OR Start time: 02/23/2024 9:23 AM End time: 02/23/2024 9:26 AM Reason for block: surgical anesthesia Staffing Performed: anesthesiologist  Anesthesiologist: Lucious Debby BRAVO, MD Performed by: Lucious Debby BRAVO, MD Authorized by: Lucious Debby BRAVO, MD   Preanesthetic Checklist Completed: patient identified, IV checked, risks and benefits discussed, surgical consent, monitors and equipment checked, pre-op evaluation and timeout performed Spinal Block Patient position: sitting Prep: DuraPrep Patient monitoring: heart rate, cardiac monitor, continuous pulse ox and blood pressure Approach: midline Location: L2-3 Injection technique: single-shot Needle Needle type: Pencan  Needle gauge: 24 G Additional Notes Consent was obtained prior to the procedure with all questions answered and concerns addressed. Risks including, but not limited to, bleeding, infection, nerve damage, paralysis, failed block, inadequate analgesia, allergic reaction, high spinal, itching, and headache were discussed and the patient wished to proceed. Functioning IV was confirmed and monitors were applied. Sterile prep and drape, including hand hygiene, mask, and sterile gloves were used. The patient was positioned and the spine was prepped. The skin was anesthetized with lidocaine . Free flow of clear CSF was obtained prior to injecting local anesthetic into the CSF. The spinal needle aspirated freely following injection. The needle was carefully withdrawn. The patient tolerated the procedure well.   Debby Lucious, MD

## 2024-02-23 NOTE — Interval H&P Note (Signed)
 History and Physical Interval Note: The patient understands that she is here today for right total knee replacement to treat her significant right knee pain and arthritis.  There has been no acute or interval change in her medical status.  The risks and benefits of surgery have been discussed in detail and informed consent has been obtained.  The right operative knee has been marked.  02/23/2024 7:14 AM  Sarah Hendricks  has presented today for surgery, with the diagnosis of right knee osteoarthritis.  The various methods of treatment have been discussed with the patient and family. After consideration of risks, benefits and other options for treatment, the patient has consented to  Procedure(s) with comments: ARTHROPLASTY, KNEE, TOTAL (Right) - Needs RNFA as a surgical intervention.  The patient's history has been reviewed, patient examined, no change in status, stable for surgery.  I have reviewed the patient's chart and labs.  Questions were answered to the patient's satisfaction.     Sarah Hendricks

## 2024-02-23 NOTE — Evaluation (Signed)
 Physical Therapy Evaluation Patient Details Name: Sarah Hendricks MRN: [id number] DOB: [birth year] Today's Date: 02/23/2024  History of Present Illness  Pt is a 78 y.o. female who presented 02/23/24 for elective R TKA. PMH: osteoarthritis, CAD/MI s/p PCI, HTN, HLD breast cancer s/p lumpectomy and radiation, GERD and vitamin D  deficiency, BCC  Clinical Impression  Pt presents with condition above and deficits mentioned below, see PT Problem List. PTA, she was living with her husband, who has dementia and uses a RW, in a 1-level house with 2 STE. Pt has fallen several times in past few months, never when using the RW though. A few months ago, pt began using SPC then progressed to RW. Prior to her fall and ankle fx in January pt was independent without DME. Currently, the pt is displaying expected deficits in R knee strength and ROM s/p TKA, but she is also displaying deficits in balance and activity tolerance. She is currently limited by symptomatic orthostatic hypotension, resulting in her only being able to march in place and not ambulate away from the bed before needing to return to supine to recover, see General Comments below for BP measurements. RN and MD notified. She tends to be tremulous when anxious, reporting her anxiety meds help with this. She is currently requiring minA for bed mobility and transfers. There is a concern for her managing herself and her husband on her own at d/c as her daughter can only stay with her through the weekend to assist. The pt and her family report they are trying to arrange for [facility] aide assistance. Will continue to follow acutely. Follow physician's recommendations for discharge plan and follow up therapies.       If plan is discharge home, recommend the following: A little help with walking and/or transfers;A little help with bathing/dressing/bathroom;Assistance with cooking/housework;Assist for transportation;Help with stairs or ramp for entrance   Can travel by  private vehicle        Equipment Recommendations None recommended by PT (has all needed DME)  Recommendations for Other Services       Functional Status Assessment Patient has had a recent decline in their functional status and demonstrates the ability to make significant improvements in function in a reasonable and predictable amount of time.     Precautions / Restrictions Precautions Precautions: Fall Precaution/Restrictions Comments: watch BP (orthostatic 10/9); educated pt to not have anything under the bend of the knee when resting Restrictions Weight Bearing Restrictions Per Provider Order: Yes RLE Weight Bearing Per Provider Order: Weight bearing as tolerated      Mobility  Bed Mobility Overal bed mobility: Needs Assistance Bed Mobility: Supine to Sit, Sit to Supine     Supine to sit: Min assist, HOB elevated, Used rails Sit to supine: Min assist, HOB elevated   General bed mobility comments: Pt needed extra time to perform bed mobility due to sticking to pads and sheets. Pt able to manage legs out R EOB, but needed minA to scoot hips using pads to sit up R EOB. MinA to lift R leg back onto bed with return to supine. MaxA to scoot superiorly in bed with bed in trendelenburg and cues provided for pt to place L foot on bed to push through to scoot up    Transfers Overall transfer level: Needs assistance Equipment used: Rolling walker (2 wheels) Transfers: Sit to/from Stand Sit to Stand: Min assist, Contact guard assist           General transfer comment: Pt needed  repeated reminders not to pull up on RW, but rather push up with at least one hand on the bed. Pt with one hand on bed and one on RW, needing minA to power up the first rep and CGA for safety the second rep    Ambulation/Gait             Pre-gait activities: Began with lateral weight shifting then progressed to marching in place with minA and RW support. Pt displayed R leg instability with weight  bearing, thus cued pt to push through arms on RW to support herself better. Pt only able to march a few times before needing to sit down due to symptomatic orthostatic hypotension General Gait Details: unable to ambulate away from EOB due to symptomatic orthostatic hypotension  Stairs            Wheelchair Mobility     Tilt Bed    Modified Rankin (Stroke Patients Only)       Balance Overall balance assessment: Needs assistance Sitting-balance support: No upper extremity supported, Feet supported Sitting balance-Leahy Scale: Good     Standing balance support: Bilateral upper extremity supported, During functional activity, Reliant on assistive device for balance Standing balance-Leahy Scale: Poor Standing balance comment: reliant on RW and minA                             Pertinent Vitals/Pain Pain Assessment Pain Assessment: Faces Faces Pain Scale: Hurts even more Pain Location: R knee Pain Descriptors / Indicators: Discomfort, Grimacing, Guarding, Operative site guarding Pain Intervention(s): Limited activity within patient's tolerance, Monitored during session, Repositioned    Home Living Family/patient expects to be discharged to:: Private residence Living Arrangements: Spouse/significant other (spouse has dementia and uses RW) Available Help at Discharge: Family (husband available 24/7 but has dementia and uses RW so limited in amount of assistance he can provide, daughter planning to stay with pt 24/7 through the weekend then needs to return home, they are trying to arrange some HH aides to assist) Type of Home: House Home Access: Stairs to enter Entrance Stairs-Rails: Right Entrance Stairs-Number of Steps: 2   Home Layout: One level Home Equipment: Agricultural consultant (2 wheels);Cane - single point;BSC/3in1;Shower seat;Grab bars - tub/shower      Prior Function Prior Level of Function : Independent/Modified Independent;Driving;History of Falls (last  six months)             Mobility Comments: Pt has fallen several times in past few months, never when using the RW though. A few months ago, pt began using SPC then progressed to RW. Prior to her fall and ankle fx in January pt was independent without DME       Extremity/Trunk Assessment   Upper Extremity Assessment Upper Extremity Assessment: Defer to OT evaluation    Lower Extremity Assessment Lower Extremity Assessment: RLE deficits/detail RLE Deficits / Details: s/p TKA 10/9, denied numbness/tingling at toe, rest of leg covered in ACE bandage; weakness and limitations in ROM expected s/p TKA with knee extension lacking ~10-15' and pt achieving ~30' knee flexion AAROM    Cervical / Trunk Assessment Cervical / Trunk Assessment: Normal  Communication   Communication Communication: No apparent difficulties    Cognition Arousal: Alert Behavior During Therapy: Anxious   PT - Cognitive impairments: No apparent impairments                       PT - Cognition  Comments: Pt becomes tremulous when anxious, reports her anxiety meds help with this. Following commands: Impaired Following commands impaired: Follows multi-step commands with increased time     Cueing Cueing Techniques: Verbal cues, Tactile cues     General Comments General comments (skin integrity, edema, etc.): Provided pt with TKA HEP handout. BP 134/80 sitting after standing first rep, SBP 111 standing second rep, BP 177/81 supine end of session; educated pt on use of ice, ankle pumps, elevation, and resting with knee extended    Exercises     Assessment/Plan    PT Assessment Patient needs continued PT services  PT Problem List Decreased strength;Decreased range of motion;Decreased activity tolerance;Decreased balance;Decreased mobility;Pain       PT Treatment Interventions Gait training;DME instruction;Stair training;Functional mobility training;Therapeutic activities;Therapeutic  exercise;Neuromuscular re-education;Balance training;Patient/family education    PT Goals (Current goals can be found in the Care Plan section)  Acute Rehab PT Goals Patient Stated Goal: to get better PT Goal Formulation: With patient/family Time For Goal Achievement: 03/01/24 Potential to Achieve Goals: Good    Frequency 7X/week     Co-evaluation               AM-PAC PT 6 Clicks Mobility  Outcome Measure Help needed turning from your back to your side while in a flat bed without using bedrails?: A Little Help needed moving from lying on your back to sitting on the side of a flat bed without using bedrails?: A Little Help needed moving to and from a bed to a chair (including a wheelchair)?: A Little Help needed standing up from a chair using your arms (e.g., wheelchair or bedside chair)?: A Little Help needed to walk in hospital room?: Total Help needed climbing 3-5 steps with a railing? : Total 6 Click Score: 14    End of Session Equipment Utilized During Treatment: Gait belt Activity Tolerance: Treatment limited secondary to medical complications (Comment) (limited by orthostatic BP) Patient left: in bed;with call bell/phone within reach;with bed alarm set;with family/visitor present Nurse Communication: Mobility status;Other (comment) (BP - notified MD) PT Visit Diagnosis: Unsteadiness on feet (R26.81);Other abnormalities of gait and mobility (R26.89);Muscle weakness (generalized) (M62.81);History of falling (Z91.81);Repeated falls (R29.6);Difficulty in walking, not elsewhere classified (R26.2);Pain Pain - Right/Left: Right Pain - part of body: Knee    Time: 1558-1630 PT Time Calculation (min) (ACUTE ONLY): 32 min   Charges:   PT Evaluation $PT Eval Moderate Complexity: 1 Mod PT Treatments $Therapeutic Activity: 8-22 mins PT General Charges $$ ACUTE PT VISIT: 1 Visit         Theo Ferretti, PT, DPT Acute Rehabilitation Services  Office:  [phone]   Theo CHRISTELLA Ferretti 02/23/2024, 4:49 PM

## 2024-02-23 NOTE — Discharge Instructions (Signed)

## 2024-02-23 NOTE — Transfer of Care (Signed)
 Immediate Anesthesia Transfer of Care Note  Patient: Sarah Hendricks  Procedure(s) Performed: ARTHROPLASTY, KNEE, TOTAL (Right: Knee)  Patient Location: PACU  Anesthesia Type:MAC combined with regional for post-op pain  Level of Consciousness: awake and alert   Airway & Oxygen Therapy: Patient Spontanous Breathing  Post-op Assessment: Report given to RN and Post -op Vital signs reviewed and stable  Post vital signs: Reviewed and stable  Last Vitals:  Vitals Value Taken Time  BP 166/67 02/23/24 11:20  Temp    Pulse 72 02/23/24 11:22  Resp 13 02/23/24 11:22  SpO2 94 % 02/23/24 11:22  Vitals shown include unfiled device data.  Last Pain:  Vitals:   02/23/24 0745  TempSrc:   PainSc: 0-No pain         Complications: No notable events documented.

## 2024-02-23 NOTE — Anesthesia Postprocedure Evaluation (Signed)
 Anesthesia Post Note  Patient: Sarah Hendricks  Procedure(s) Performed: ARTHROPLASTY, KNEE, TOTAL (Right: Knee)     Patient location during evaluation: PACU Anesthesia Type: Spinal Level of consciousness: awake and alert Pain management: pain level controlled Vital Signs Assessment: post-procedure vital signs reviewed and stable Respiratory status: spontaneous breathing and respiratory function stable Cardiovascular status: blood pressure returned to baseline and stable Postop Assessment: spinal receding and no apparent nausea or vomiting Anesthetic complications: no   No notable events documented.  Last Vitals:  Vitals:   02/23/24 1245 02/23/24 1300  BP: (!) 177/68 (!) 168/65  Pulse: 62 65  Resp: 14 12  Temp:    SpO2: 96% 94%    Last Pain:  Vitals:   02/23/24 1245  TempSrc:   PainSc: 0-No pain                 Debby FORBES Like

## 2024-02-24 ENCOUNTER — Encounter (HOSPITAL_COMMUNITY): Payer: Self-pay | Admitting: Orthopaedic Surgery

## 2024-02-24 DIAGNOSIS — I509 Heart failure, unspecified: Secondary | ICD-10-CM | POA: Diagnosis not present

## 2024-02-24 DIAGNOSIS — Z79899 Other long term (current) drug therapy: Secondary | ICD-10-CM | POA: Diagnosis not present

## 2024-02-24 DIAGNOSIS — M1711 Unilateral primary osteoarthritis, right knee: Secondary | ICD-10-CM | POA: Diagnosis not present

## 2024-02-24 DIAGNOSIS — I110 Hypertensive heart disease with heart failure: Secondary | ICD-10-CM | POA: Diagnosis not present

## 2024-02-24 DIAGNOSIS — I2510 Atherosclerotic heart disease of native coronary artery without angina pectoris: Secondary | ICD-10-CM | POA: Diagnosis not present

## 2024-02-24 DIAGNOSIS — Z955 Presence of coronary angioplasty implant and graft: Secondary | ICD-10-CM | POA: Diagnosis not present

## 2024-02-24 DIAGNOSIS — Z85828 Personal history of other malignant neoplasm of skin: Secondary | ICD-10-CM | POA: Diagnosis not present

## 2024-02-24 DIAGNOSIS — M25561 Pain in right knee: Secondary | ICD-10-CM | POA: Diagnosis not present

## 2024-02-24 DIAGNOSIS — Z853 Personal history of malignant neoplasm of breast: Secondary | ICD-10-CM | POA: Diagnosis not present

## 2024-02-24 LAB — CBC
HCT: 31.6 % — ABNORMAL LOW (ref 36.0–46.0)
Hemoglobin: 10.4 g/dL — ABNORMAL LOW (ref 12.0–15.0)
MCH: 32 pg (ref 26.0–34.0)
MCHC: 32.9 g/dL (ref 30.0–36.0)
MCV: 97.2 fL (ref 80.0–100.0)
Platelets: 310 K/uL (ref 150–400)
RBC: 3.25 MIL/uL — ABNORMAL LOW (ref 3.87–5.11)
RDW: 15.3 % (ref 11.5–15.5)
WBC: 10.8 K/uL — ABNORMAL HIGH (ref 4.0–10.5)
nRBC: 0 % (ref 0.0–0.2)

## 2024-02-24 LAB — BASIC METABOLIC PANEL WITH GFR
Anion gap: 12 (ref 5–15)
BUN: 8 mg/dL (ref 8–23)
CO2: 24 mmol/L (ref 22–32)
Calcium: 8.8 mg/dL — ABNORMAL LOW (ref 8.9–10.3)
Chloride: 95 mmol/L — ABNORMAL LOW (ref 98–111)
Creatinine, Ser: 0.8 mg/dL (ref 0.44–1.00)
GFR, Estimated: >60 mL/min (ref >60)
Glucose, Bld: 160 mg/dL — ABNORMAL HIGH (ref 70–99)
Potassium: 4.1 mmol/L (ref 3.5–5.1)
Sodium: 131 mmol/L — ABNORMAL LOW (ref 135–145)

## 2024-02-24 MED ORDER — METHOCARBAMOL 500 MG PO TABS: 500.0000 mg | ORAL_TABLET | Freq: Four times a day (QID) | ORAL | 1 refills | Status: DC | PRN

## 2024-02-24 MED ORDER — HYDROCODONE-ACETAMINOPHEN 5-325 MG PO TABS: 1.0000 | ORAL_TABLET | ORAL | Status: DC | PRN

## 2024-02-24 MED ORDER — HYDROCODONE-ACETAMINOPHEN 5-325 MG PO TABS: 1.0000 | ORAL_TABLET | ORAL | 0 refills | Status: DC | PRN

## 2024-02-24 NOTE — Plan of Care (Signed)

## 2024-02-24 NOTE — Progress Notes (Signed)
 Subjective: 1 Day Post-Op Procedure(s) (LRB): ARTHROPLASTY, KNEE, TOTAL (Right) Patient reports pain as moderate.  Slow progress with PT this morning . However wanting to go home. Will need to do 2 steps to get into home. Otherwise no complaints. Asking to stop oxycodone .   Objective: Vital signs in last 24 hours: Temp:  [97 F (36.1 C)-97.9 F (36.6 C)] 97.8 F (36.6 C) (10/10 0734) Pulse Rate:  [56-79] 65 (10/10 0734) Resp:  [11-20] 16 (10/10 0734) BP: (139-180)/(60-74) 139/72 (10/10 0734) SpO2:  [92 %-97 %] 95 % (10/10 0734)  Intake/Output from previous day: 10/09 0701 - 10/10 0700 In: 678.6 [P.O.:360; I.V.:80.1; IV Piggyback:238.5] Out: 2250 [Urine:2250] Intake/Output this shift: No intake/output data recorded.  Recent Labs    02/24/24 0357  HGB 10.4*   Recent Labs    02/24/24 0357  WBC 10.8*  RBC 3.25*  HCT 31.6*  PLT 310   Recent Labs    02/24/24 0357  NA 131*  K 4.1  CL 95*  CO2 24  BUN 8  CREATININE 0.80  GLUCOSE 160*  CALCIUM  8.8*   No results for input(s): LABPT, INR in the last 72 hours.  Sensation intact distally Dorsiflexion/Plantar flexion intact Incision: no drainage Compartment soft   Assessment/Plan: 1 Day Post-Op Procedure(s) (LRB): ARTHROPLASTY, KNEE, TOTAL (Right) Up with therapy Discharge home with home health if progresses well with PT and is medically stable.       Sarah Hendricks 02/24/2024, 10:07 AM

## 2024-02-24 NOTE — TOC Transition Note (Signed)
 Transition of Care [facility]) - Discharge Note   Patient Details  Name: Sarah Hendricks MRN: [id number] Date of Birth: [birth year]  [facility] CM/SW Contact:  Rosalva Jon Bloch, RN Phone Number: 02/24/2024, 12:28 PM   Clinical Narrative:    Patient will DC to: home Anticipated DC date: 02/24/2024 Family notified: yes Transport by: car      - s/p  total right knee replacement  Per MD patient ready for DC today. RN, patient, patient's family, and [facility] notified of DC. Pt without DME needs. Family to assist with care once home. Post hospital f/u noted on AVS. Family to provide transportation to home.  RNCM will sign off for now as intervention is no longer needed. Please consult us  again if new needs arise.    Final next level of care: Home w Home Health Services Barriers to Discharge: No Barriers Identified   Patient Goals and CMS Choice            Discharge Placement                       Discharge Plan and Services Additional resources added to the After Visit Summary for                            [facility] Arranged: PT HH Agency: [facility] Date [facility] Agency Contacted: 02/24/24 Time HH Agency Contacted: 1206 Representative spoke with at [facility] Agency: [place]  Social Drivers of Health (SDOH) Interventions SDOH Screenings   Food Insecurity: No Food Insecurity (02/23/2024)  Housing: Unknown (02/23/2024)  Transportation Needs: No Transportation Needs (02/23/2024)  Utilities: Not At Risk (02/23/2024)  Alcohol Screen: Low Risk  (09/27/2023)  Depression (PHQ2-9): Medium Risk (11/14/2023)  Financial Resource Strain: Low Risk  (02/18/2024)  Physical Activity: Insufficiently Active (02/18/2024)  Social Connections: Moderately Integrated (02/23/2024)  Stress: No Stress Concern Present (02/18/2024)  Tobacco Use: Low Risk  (02/23/2024)  Health Literacy: Adequate Health Literacy (09/27/2023)     Readmission Risk Interventions     No data to  display

## 2024-02-24 NOTE — Progress Notes (Signed)
 Physical Therapy Treatment Patient Details Name: Sarah Hendricks MRN: [id number] DOB: 03-19-1946 Today's Date: 02/24/2024   History of Present Illness Pt is a 78 y.o. female who presented 02/23/24 for elective R TKA. PMH: osteoarthritis, CAD/MI s/p PCI, HTN, HLD breast cancer s/p lumpectomy and radiation, GERD and vitamin D  deficiency, BCC    PT Comments  Despite significant R knee pain pt able to amb 20' with RW and max step by step cues for sequencing stepping pattern. Pt was able to asc/desc 2 steps with use of R hand rail and sideways technique to enter home. Daughter present with good verbal recall of sideways technique. Pt not safe to return home without support of people other than her spouse as her spouse can not physically assist patient. Discussed getting more help in the house for next week if daughter and son can't be there longer than the weekend. Recommend HHPT to progress R knee ROM, R LE strengthening, and to reach safe mod I level of function. Acute PT to cont to follow.    If plan is discharge home, recommend the following: A little help with walking and/or transfers;A little help with bathing/dressing/bathroom;Assistance with cooking/housework;Assist for transportation;Help with stairs or ramp for entrance   Can travel by private vehicle        Equipment Recommendations  None recommended by PT (has all needed DME)    Recommendations for Other Services       Precautions / Restrictions Precautions Precautions: Fall Precaution/Restrictions Comments: educated pt to not have anything under the bend of the knee when resting Restrictions Weight Bearing Restrictions Per Provider Order: Yes RLE Weight Bearing Per Provider Order: Weight bearing as tolerated     Mobility  Bed Mobility Overal bed mobility: Needs Assistance Bed Mobility: Supine to Sit     Supine to sit: HOB elevated, Used rails, Mod assist     General bed mobility comments: received in chair     Transfers Overall transfer level: Needs assistance Equipment used: Rolling walker (2 wheels) Transfers: Sit to/from Stand, Bed to chair/wheelchair/BSC Sit to Stand: Min assist   Step pivot transfers: Min assist, Mod assist       General transfer comment: continued reminders to push up from arm rests, due to onset of R knee pain pt tookd 3 attempts to stand, minA to steady patient during transition of hands from arm rests to RW    Ambulation/Gait Ambulation/Gait assistance: Min assist, +2 safety/equipment (2nd person for chair follow) Gait Distance (Feet): 20 Feet Assistive device: Rolling walker (2 wheels) Gait Pattern/deviations: Step-to pattern, Decreased stride length, Antalgic Gait velocity: dec Gait velocity interpretation: <1.31 ft/sec, indicative of household ambulator   General Gait Details: pt with minimal R LE WBing tolerance, step by step verbal cues to sequencing stepping given. Step with R foot first, make knee straight, increase weight through your arms and then step with the L  pt reports exhaustion s/p amb 20' with frequent standing rest breaks   Stairs Stairs: Yes Stairs assistance: Min assist Stair Management: One rail Right, Sideways, Step to pattern Number of Stairs: 2 (to mimic home) General stair comments: pt used R HR and went up sideways. dtr reports pt can grip underhand on the rail. Pt was able to pull self up 2 steps leading with the L. dtr present to witness. pt with good recall of sequencing up with the good (L), down with the bad (R)   Wheelchair Mobility     Tilt Bed    Modified Rankin (  Stroke Patients Only)       Balance Overall balance assessment: Needs assistance Sitting-balance support: Feet supported Sitting balance-Leahy Scale: Good     Standing balance support: Bilateral upper extremity supported, During functional activity, Reliant on assistive device for balance Standing balance-Leahy Scale: Poor Standing balance  comment: reliant on RW                            Communication Communication Communication: No apparent difficulties  Cognition Arousal: Alert Behavior During Therapy: Anxious   PT - Cognitive impairments: No apparent impairments                       PT - Cognition Comments: Pt becomes tremulous when anxious, reports her anxiety meds help with this. Following commands: Impaired Following commands impaired: Follows multi-step commands with increased time    Cueing Cueing Techniques: Verbal cues, Tactile cues  Exercises Total Joint Exercises Ankle Circles/Pumps: AROM, Both, 10 reps, Supine Gluteal Sets: AROM, Right, 10 reps, Supine (with 5 sec hold) Heel Slides: AAROM, Right, 10 reps, Seated Long Arc Quad: AAROM, Right, 10 reps, Seated    General Comments General comments (skin integrity, edema, etc.): VSS      Pertinent Vitals/Pain Pain Assessment Pain Assessment: Faces Faces Pain Scale: Hurts whole lot Pain Location: R knee with movement and weight-bearing Pain Descriptors / Indicators: Discomfort, Grimacing, Guarding, Operative site guarding    Home Living Family/patient expects to be discharged to:: Private residence Living Arrangements: Spouse/significant other (spouse has dementia and uses RW) Available Help at Discharge: Family;Available 24 hours/day (daughter will stay 24 hours initially) Type of Home: House Home Access: Stairs to enter Entrance Stairs-Rails: Right Entrance Stairs-Number of Steps: 2   Home Layout: One level Home Equipment: Agricultural consultant (2 wheels);Cane - single point;BSC/3in1;Shower seat;Grab bars - tub/shower Additional Comments: daughter has a Sports administrator pt can use    Prior Function            PT Goals (current goals can now be found in the care plan section) Acute Rehab PT Goals Patient Stated Goal: to get better PT Goal Formulation: With patient/family Time For Goal Achievement: 03/01/24 Potential to Achieve  Goals: Good Progress towards PT goals: Progressing toward goals    Frequency    7X/week      PT Plan      Co-evaluation              AM-PAC PT 6 Clicks Mobility   Outcome Measure  Help needed turning from your back to your side while in a flat bed without using bedrails?: A Little Help needed moving from lying on your back to sitting on the side of a flat bed without using bedrails?: A Little Help needed moving to and from a bed to a chair (including a wheelchair)?: A Little Help needed standing up from a chair using your arms (e.g., wheelchair or bedside chair)?: A Little Help needed to walk in hospital room?: A Lot Help needed climbing 3-5 steps with a railing? : A Lot 6 Click Score: 16    End of Session Equipment Utilized During Treatment: Gait belt Activity Tolerance: Patient limited by pain Patient left: with call bell/phone within reach;with family/visitor present;in chair;with chair alarm set Nurse Communication: Mobility status;Other (comment) (BP - notified RN) PT Visit Diagnosis: Unsteadiness on feet (R26.81);Other abnormalities of gait and mobility (R26.89);Muscle weakness (generalized) (M62.81);History of falling (Z91.81);Repeated falls (R29.6);Difficulty in walking, not elsewhere  classified (R26.2);Pain Pain - Right/Left: Right Pain - part of body: Knee     Time: 1135-1221 PT Time Calculation (min) (ACUTE ONLY): 46 min  Charges:    $Gait Training: 38-52 mins PT General Charges $$ ACUTE PT VISIT: 1 Visit                     Norene Ames, PT, DPT Acute Rehabilitation Services Secure chat preferred Office #: [phone]    Norene CHRISTELLA Ames 02/24/2024, 1:56 PM

## 2024-02-24 NOTE — Discharge Summary (Signed)
 Patient ID: Sarah Hendricks MRN: [id number] DOB/AGE: [birth year] 78 y.o.  Admit date: 02/23/2024 Discharge date: 02/24/2024  Admission Diagnoses:  Principal Problem:   Unilateral primary osteoarthritis, right knee Active Problems:   Status post total right knee replacement   Discharge Diagnoses:  Same  Past Medical History:  Diagnosis Date   Allergy    Anxiety    Arthritis    Basal cell carcinoma (BCC) of left side of nose 2008   Bimalleolar ankle fracture, right, closed, initial encounter    Breast cancer (HCC) 1994   lumpectomy and XRT, no chemo   CAD (coronary artery disease) 2008   PTCA, in [place]   Dysrhythmia    Atrial fibrillation   GERD (gastroesophageal reflux disease)    History of chicken pox    Hyperlipidemia    Hypertension    Myocardial infarction (HCC) 03/15/2007   stent placed   Vitamin D  deficiency     Surgeries: Procedure(s): ARTHROPLASTY, KNEE, TOTAL on 02/23/2024   Consultants:   Discharged Condition: Improved  Hospital Course: Sarah Hendricks is an 78 y.o. female who was admitted 02/23/2024 for operative treatment ofUnilateral primary osteoarthritis, right knee. Patient has severe unremitting pain that affects sleep, daily activities, and work/hobbies. After pre-op clearance the patient was taken to the operating room on 02/23/2024 and underwent  Procedure(s): ARTHROPLASTY, KNEE, TOTAL.    Patient was given perioperative antibiotics:  Anti-infectives (From admission, onward)    Start     Dose/Rate Route Frequency Ordered Stop   02/23/24 1600  ceFAZolin  (ANCEF ) IVPB 2g/100 mL premix        2 g 200 mL/hr over 30 Minutes Intravenous Every 6 hours 02/23/24 1342 02/23/24 2216   02/23/24 0730  ceFAZolin  (ANCEF ) IVPB 2g/100 mL premix        2 g 200 mL/hr over 30 Minutes Intravenous On call to O.R. 02/23/24 9281 02/23/24 0928   02/23/24 0721  ceFAZolin  (ANCEF ) 2-4 GM/100ML-% IVPB       Note to Pharmacy: Ezequiel Henri: cabinet override       02/23/24 0721 02/23/24 0928        Patient was given sequential compression devices, early ambulation, and chemoprophylaxis to prevent DVT.  Inpatient Morphine Milligram Equivalents Per Day 10/9 - 10/10   Values displayed are in units of MME/Day    Order Start / End Date Yesterday Today    oxyCODONE  (Oxy IR/ROXICODONE ) immediate release tablet 5 mg 10/9 - 10/9 0 of Unknown --    oxyCODONE  (ROXICODONE ) 5 MG/5ML solution 5 mg 10/9 - 10/9 0 of Unknown --      Group total: 0 of Unknown     fentaNYL  (SUBLIMAZE ) injection 25-50 mcg 10/9 - 10/9 0 of 45-90 --    fentaNYL  (SUBLIMAZE ) 100 MCG/2ML injection 10/9 - 10/9 0 of Unknown --    fentaNYL  (SUBLIMAZE ) injection 50 mcg 10/9 - 10/9 15 of 15 --    HYDROmorphone  (DILAUDID ) injection 0.5-1 mg 10/9 - No end date 0 of 30-60 0 of 60-120    oxyCODONE  (Oxy IR/ROXICODONE ) immediate release tablet 5-10 mg 10/9 - No end date 15 of 22.5-45 0 of 45-90    oxyCODONE  (Oxy IR/ROXICODONE ) immediate release tablet 10-15 mg 10/9 - No end date 0 of 45-67.5 15 of 90-135    HYDROcodone-acetaminophen  (NORCO/VICODIN) 5-325 MG per tablet 1-2 tablet 10/10 - No end date -- 0 of 20-40    Daily Totals  30 of Unknown (at least 157.5-277.5) 15 of 215-385    Calculation Errors  Order Type Date Details   oxyCODONE  (Oxy IR/ROXICODONE ) immediate release tablet 5 mg Ordered Dose -- Insufficient frequency information   oxyCODONE  (ROXICODONE ) 5 MG/5ML solution 5 mg Ordered Dose -- Insufficient frequency information   fentaNYL  (SUBLIMAZE ) 100 MCG/2ML injection Ordered Dose -- Frequency type could not be determined            Patient benefited maximally from hospital stay and there were no complications.    Recent vital signs: Patient Vitals for the past 24 hrs:  BP Temp Temp src Pulse Resp SpO2  02/24/24 0734 139/72 97.8 F (36.6 C) Oral 65 16 95 %  02/24/24 0418 (!) 149/63 97.9 F (36.6 C) -- 79 18 92 %  02/23/24 2008 (!) 176/74 (!) 97.4 F (36.3 C) -- 73 18 96 %   02/23/24 1417 -- 97.6 F (36.4 C) Oral 76 18 95 %  02/23/24 1300 (!) 168/65 97.8 F (36.6 C) -- 65 12 94 %  02/23/24 1245 (!) 177/68 -- -- 62 14 96 %  02/23/24 1230 (!) 169/63 -- -- 62 11 93 %  02/23/24 1215 (!) 180/67 -- -- (!) 58 12 95 %  02/23/24 1200 (!) 172/69 -- -- (!) 56 11 95 %  02/23/24 1145 (!) 168/66 -- -- 64 13 93 %  02/23/24 1130 (!) 152/60 -- -- 69 15 96 %  02/23/24 1120 (!) 166/67 (!) 97 F (36.1 C) -- 68 20 97 %     Recent laboratory studies:  Recent Labs    02/24/24 0357  WBC 10.8*  HGB 10.4*  HCT 31.6*  PLT 310  NA 131*  K 4.1  CL 95*  CO2 24  BUN 8  CREATININE 0.80  GLUCOSE 160*  CALCIUM  8.8*     Discharge Medications:   Allergies as of 02/24/2024       Reactions   Acetaminophen -codeine Other (See Comments)   Bad dreams   Tigan [trimethobenzamide] Other (See Comments)   Bad dreams   Atorvastatin  Other (See Comments)   Pt reports causes bilateral leg pains.   Codeine    Bad dreams   Fosamax [alendronate] Other (See Comments)   Caused leg pains    Nsaids Other (See Comments)   History of GI bleeds   Simvastatin Other (See Comments)   Leg cramps        Medication List     TAKE these medications    apixaban  5 MG Tabs tablet Commonly known as: ELIQUIS  Take 1 tablet (5 mg total) by mouth 2 (two) times daily.   CAL-MAG-ZINC PO Take 1 tablet by mouth every other day.   Cholecalciferol  25 MCG (1000 UT) tablet Commonly known as: EQL Vitamin D3 Take 1 tablet (1,000 Units total) by mouth daily.   escitalopram  10 MG tablet Commonly known as: LEXAPRO  TAKE 1 TABLET(10 MG) BY MOUTH DAILY What changed: See the new instructions.   ferrous sulfate  325 (65 FE) MG EC tablet Take 1 tablet (325 mg total) by mouth 2 (two) times daily.   furosemide  20 MG tablet Commonly known as: LASIX  Take one tablet 4 times per week What changed:  how much to take how to take this when to take this additional instructions    HYDROcodone-acetaminophen  5-325 MG tablet Commonly known as: NORCO/VICODIN Take 1-2 tablets by mouth every 4 (four) hours as needed for moderate pain (pain score 4-6).   losartan  100 MG tablet Commonly known as: COZAAR  TAKE 1 TABLET(100 MG) BY MOUTH DAILY   methocarbamol 500 MG tablet  Commonly known as: ROBAXIN Take 1 tablet (500 mg total) by mouth every 6 (six) hours as needed for muscle spasms.   pantoprazole  20 MG tablet Commonly known as: Protonix  Take 1 tablet (20 mg total) by mouth daily as needed for heartburn.   propranolol  80 MG tablet Commonly known as: INDERAL  Take 1 tablet (80 mg total) by mouth 2 (two) times daily.   rosuvastatin  10 MG tablet Commonly known as: CRESTOR  Take 1 tablet (10 mg total) by mouth daily.               Durable Medical Equipment  (From admission, onward)           Start     Ordered   02/23/24 1342  DME 3 n 1  Once        02/23/24 1342   02/23/24 1342  DME Walker rolling  Once       Question Answer Comment  Walker: With 5 Inch Wheels   Patient needs a walker to treat with the following condition Status post total right knee replacement      02/23/24 1342            Diagnostic Studies: DG Knee Right Port Result Date: 02/23/2024 CLINICAL DATA:  Status post right knee replacement EXAM: PORTABLE RIGHT KNEE - 1-2 VIEW COMPARISON:  None Available. FINDINGS: Right knee arthroplasty in expected alignment. No periprosthetic lucency or fracture. There has been patellar resurfacing. Recent postsurgical change includes air and edema in the soft tissues and joint space. Anterior skin staples in place IMPRESSION: Right knee arthroplasty without immediate postoperative complication. Electronically Signed   By: Andrea Gasman M.D.   On: 02/23/2024 13:00   DG Hand Complete Right Result Date: 02/11/2024 CLINICAL DATA:  Hand pain.  Fall. EXAM: RIGHT HAND - COMPLETE 3+ VIEW COMPARISON:  None Available. FINDINGS: No evidence for acute  fracture or dislocation. There is multifocal joint space narrowing with subchondral sclerosis and marginal osteophytosis, most advanced at the triscaphe joint as well as multiple DIP joints. No periarticular erosions are visualized. The soft tissues are unremarkable. IMPRESSION: Multifocal osteoarthritis.  No acute fracture or dislocation. Electronically Signed   By: Reyes Honer M.D.   On: 02/11/2024 11:26    Disposition: Discharge disposition: 06-Home-Health Care Svc          Follow-up Information     Vernetta Lonni GRADE, MD Follow up in 2 week(s).   Specialty: Orthopedic Surgery Contact information: [address] [place]  [place] [address] [phone]                  Signed: BERTRUM GASKINS 02/24/2024, 10:41 AM

## 2024-02-24 NOTE — Progress Notes (Signed)
 Physical Therapy Treatment Patient Details Name: Sarah Hendricks MRN: [id number] DOB: [birth year] Today's Date: 02/24/2024   History of Present Illness Pt is a 78 y.o. female who presented 02/23/24 for elective R TKA. PMH: osteoarthritis, CAD/MI s/p PCI, HTN, HLD breast cancer s/p lumpectomy and radiation, GERD and vitamin D  deficiency, BCC    PT Comments  Pt with significant soreness in R knee limiting pt's ability to tolerate R LE WBing and progress ambulation. Pt requiring significant increase in time to complete transfer to [facility] and then to recliner. Pt unable to clear L foot despite max encouragement to push through bilat UEs to off-weight R LE during L LE advancement. Pt unable to ambulate at this time. Pt very eager to return home today however had extensive discussion with patient, son, daughter, and spouse regarding the necessity for her to be able to negotiate 2 steps with hand rails to enter home and to be able to walk/from bathroom. Daughter will only be in the home through Sunday. 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 At this time I do not recommend patient to return home due to inability to mobilize without significant assist, increased fall risk, and inability to enter home. PT will return later today for second session to re-assess mobility and d/c recommendations.     If plan is discharge home, recommend the following: A little help with walking and/or transfers;A little help with bathing/dressing/bathroom;Assistance with cooking/housework;Assist for transportation;Help with stairs or ramp for entrance   Can travel by private vehicle        Equipment Recommendations  None recommended by PT (has all needed DME)    Recommendations for Other Services       Precautions / Restrictions Precautions Precautions: Fall Precaution/Restrictions Comments: educated pt to not have anything under the bend of the knee when resting Restrictions Weight Bearing  Restrictions Per Provider Order: Yes RLE Weight Bearing Per Provider Order: Weight bearing as tolerated     Mobility  Bed Mobility Overal bed mobility: Needs Assistance Bed Mobility: Supine to Sit     Supine to sit: HOB elevated, Used rails, Mod assist     General bed mobility comments: pt requiring significant increase in time, HOB elevated, step by step cues to move LEs to EOB, modA for L LE movement into ABD and to scoot to EOB    Transfers Overall transfer level: Needs assistance Equipment used: Rolling walker (2 wheels) Transfers: Sit to/from Stand, Bed to chair/wheelchair/BSC Sit to Stand: Min assist   Step pivot transfers: Min assist, Mod assist       General transfer comment: Pt needed repeated reminders not to pull up on RW, but rather push up with  L UE and put R UE on RW to allow increase weight through L LE, the stronger LE. Pt unable to step to [facility] and had to pvt on L foot, pt unable to put any weight on R LE, max verbal cues to push through bilat UEs to off weight R LE, significant increase in time to get to [facility] and then to chair, unable to clear L foot    Ambulation/Gait               General Gait Details: unable to ambulate away from EOB due to inability to tolerate R LE WBing, c/o ooooo it's so sore   Stairs             Wheelchair Mobility     Tilt  Bed    Modified Rankin (Stroke Patients Only)       Balance Overall balance assessment: Needs assistance Sitting-balance support: No upper extremity supported, Feet supported Sitting balance-Leahy Scale: Good     Standing balance support: Bilateral upper extremity supported, During functional activity, Reliant on assistive device for balance Standing balance-Leahy Scale: Poor Standing balance comment: reliant on RW and minA, pt was able to perform pericare s/p toileting however required maxA to maintain upright standing                            Communication  Communication Communication: No apparent difficulties  Cognition Arousal: Alert Behavior During Therapy: Anxious   PT - Cognitive impairments: No apparent impairments                       PT - Cognition Comments: Pt becomes tremulous when anxious, reports her anxiety meds help with this. Following commands: Impaired Following commands impaired: Follows multi-step commands with increased time    Cueing Cueing Techniques: Verbal cues, Tactile cues  Exercises Total Joint Exercises Ankle Circles/Pumps: AROM, Both, 10 reps, Supine Gluteal Sets: AROM, Right, 10 reps, Supine (with 5 sec hold) Heel Slides: AAROM, Right, 10 reps, Seated Long Arc Quad: AAROM, Right, 10 reps, Seated    General Comments General comments (skin integrity, edema, etc.): BP 170/53 in bed, 189/56 s/p sitting 5 min EOB, 189/71 s/p transfer to [facility] and then to chair      Pertinent Vitals/Pain Pain Assessment Pain Assessment: Faces Faces Pain Scale: Hurts whole lot Pain Location: R knee Pain Descriptors / Indicators: Discomfort, Grimacing, Guarding, Operative site guarding    Home Living                          Prior Function            PT Goals (current goals can now be found in the care plan section) Acute Rehab PT Goals Patient Stated Goal: to get better PT Goal Formulation: With patient/family Time For Goal Achievement: 03/01/24 Potential to Achieve Goals: Good Progress towards PT goals: Progressing toward goals    Frequency    7X/week      PT Plan      Co-evaluation              AM-PAC PT 6 Clicks Mobility   Outcome Measure  Help needed turning from your back to your side while in a flat bed without using bedrails?: A Little Help needed moving from lying on your back to sitting on the side of a flat bed without using bedrails?: A Little Help needed moving to and from a bed to a chair (including a wheelchair)?: A Little Help needed standing up from a chair  using your arms (e.g., wheelchair or bedside chair)?: A Little Help needed to walk in hospital room?: Total Help needed climbing 3-5 steps with a railing? : Total 6 Click Score: 14    End of Session Equipment Utilized During Treatment: Gait belt Activity Tolerance: Patient limited by pain Patient left: with call bell/phone within reach;with family/visitor present;in chair;with chair alarm set Nurse Communication: Mobility status;Other (comment) (BP - notified RN) PT Visit Diagnosis: Unsteadiness on feet (R26.81);Other abnormalities of gait and mobility (R26.89);Muscle weakness (generalized) (M62.81);History of falling (Z91.81);Repeated falls (R29.6);Difficulty in walking, not elsewhere classified (R26.2);Pain Pain - Right/Left: Right Pain - part of body: Knee  Time: 9155-9055 PT Time Calculation (min) (ACUTE ONLY): 60 min  Charges:    $Gait Training: 8-22 mins $Therapeutic Exercise: 8-22 mins $Therapeutic Activity: 23-37 mins PT General Charges $$ ACUTE PT VISIT: 1 Visit                     Norene Ames, PT, DPT Acute Rehabilitation Services Secure chat preferred Office #: [phone]    Norene CHRISTELLA Ames 02/24/2024, 9:56 AM

## 2024-02-24 NOTE — Evaluation (Signed)
 Occupational Therapy Evaluation Patient Details Name: Sarah Hendricks MRN: [id number] DOB: 1946/03/24 Today's Date: 02/24/2024   History of Present Illness   Pt is a 78 y.o. female who presented 02/23/24 for elective R TKA. PMH: osteoarthritis, CAD/MI s/p PCI, HTN, HLD breast cancer s/p lumpectomy and radiation, GERD and vitamin D  deficiency, BCC     Clinical Impressions Pt was using a RW prior to admission and modified independent in self care. 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 She has a supportive daughter and son. Pt presents with R knee pain, generalized weakness and poor standing balance and anxiety. She needs set up to moderate assistance with ADLs. Educated in benefits of reacher, sock aide and long handled bath sponge for LE ADLs and instructed to dress R LE first. Instructed in use of 3 in 1 at bedside at night and over toilet during the day. Pt's children will assist with IADLs. Recommending HHOT, will follow acutely.       If plan is discharge home, recommend the following:   A little help with walking and/or transfers;A lot of help with bathing/dressing/bathroom;Assistance with cooking/housework;Assist for transportation;Help with stairs or ramp for entrance     Functional Status Assessment   Patient has had a recent decline in their functional status and demonstrates the ability to make significant improvements in function in a reasonable and predictable amount of time.     Equipment Recommendations   None recommended by OT     Recommendations for Other Services         Precautions/Restrictions   Precautions Precautions: Fall Restrictions Weight Bearing Restrictions Per Provider Order: Yes RLE Weight Bearing Per Provider Order: Weight bearing as tolerated     Mobility Bed Mobility               General bed mobility comments: received in chair, instructed she may use gait belt as leg lifter    Transfers Overall transfer level: Needs  assistance Equipment used: Rolling walker (2 wheels) Transfers: Sit to/from Stand, Bed to chair/wheelchair/BSC Sit to Stand: Min assist                  Balance   Sitting-balance support: No upper extremity supported, Feet supported (can nearly reach her R foot in sitting) Sitting balance-Leahy Scale: Good       Standing balance-Leahy Scale: Poor Standing balance comment: reliant on RW                           ADL either performed or assessed with clinical judgement   ADL Overall ADL's : Needs assistance/impaired Eating/Feeding: Independent;Sitting   Grooming: Set up;Sitting   Upper Body Bathing: Set up;Sitting   Lower Body Bathing: Moderate assistance;Sit to/from stand Lower Body Bathing Details (indicate cue type and reason): recommended use of long handled bath sponge and reacher for R foot Upper Body Dressing : Set up;Sitting   Lower Body Dressing: Moderate assistance;Sit to/from stand Lower Body Dressing Details (indicate cue type and reason): recommended use of reacher and instructed to dress R LE first, may benefit from sock aid, has step in shoes Toilet Transfer: Minimal assistance;Rolling walker (2 wheels) Toilet Transfer Details (indicate cue type and reason): pt aware to keep BSC beside bed at night and over toilet during the day Toileting- Clothing Manipulation and Hygiene: Set up;Sitting/lateral lean               Vision Baseline Vision/History: 1 Wears glasses Ability to  See in Adequate Light: 0 Adequate Patient Visual Report: No change from baseline       Perception         Praxis         Pertinent Vitals/Pain Pain Assessment Pain Assessment: Faces Faces Pain Scale: Hurts even more Pain Location: R knee Pain Descriptors / Indicators: Discomfort, Grimacing, Guarding, Operative site guarding Pain Intervention(s): Monitored during session, Repositioned, Ice applied     Extremity/Trunk Assessment Upper Extremity  Assessment Upper Extremity Assessment: Right hand dominant;Overall [facility] for tasks assessed   Lower Extremity Assessment Lower Extremity Assessment: Defer to PT evaluation   Cervical / Trunk Assessment Cervical / Trunk Assessment: Normal   Communication Communication Communication: No apparent difficulties   Cognition Arousal: Alert Behavior During Therapy: Anxious Cognition: No apparent impairments             OT - Cognition Comments: looks to daughter to answer many questions                 Following commands: Impaired Following commands impaired: Follows multi-step commands with increased time     Cueing  General Comments   Cueing Techniques: Verbal cues;Tactile cues      Exercises     Shoulder Instructions      Home Living Family/patient expects to be discharged to:: Private residence Living Arrangements: Spouse/significant other (spouse has dementia and uses RW) Available Help at Discharge: Family;Available 24 hours/day (daughter will stay 24 hours initially) Type of Home: House Home Access: Stairs to enter [employer] of Steps: 2 Entrance Stairs-Rails: Right Home Layout: One level     Bathroom Shower/Tub: Producer, television/film/video: Handicapped height     Home Equipment: Agricultural consultant (2 wheels);Cane - single point;BSC/3in1;Shower seat;Grab bars - tub/shower   Additional Comments: daughter has a Sports administrator pt can use      Prior Functioning/Environment Prior Level of Function : Independent/Modified Independent;Driving;History of Falls (last six months)             Mobility Comments: Pt has fallen several times in past few months, never when using the RW though. A few months ago, pt began using SPC then progressed to RW. Prior to her fall and ankle fx in January pt was independent without DME      OT Problem List: Decreased strength;Pain;Impaired balance (sitting and/or standing)   OT Treatment/Interventions:  Self-care/ADL training;DME and/or AE instruction;Therapeutic activities;Patient/family education;Balance training      OT Goals(Current goals can be found in the care plan section)   Acute Rehab OT Goals OT Goal Formulation: With patient Time For Goal Achievement: 03/09/24 Potential to Achieve Goals: Good ADL Goals Pt Will Perform Grooming: with supervision;standing Pt Will Perform Lower Body Bathing: with supervision;with adaptive equipment;sit to/from stand Pt Will Perform Lower Body Dressing: with supervision;sitting/lateral leans;with adaptive equipment Pt Will Transfer to Toilet: with supervision;ambulating;bedside commode Pt Will Perform Toileting - Clothing Manipulation and hygiene: with supervision;sit to/from stand Pt Will Perform Tub/Shower Transfer: with supervision;ambulating;shower seat;grab bars;rolling walker Additional ADL Goal #1: Pt will complete bed mobility mod I in preparation for ADLs.   OT Frequency:  Min 2X/week    Co-evaluation              AM-PAC OT 6 Clicks Daily Activity     Outcome Measure Help from another person eating meals?: None Help from another person taking care of personal grooming?: A Little Help from another person toileting, which includes using toliet, bedpan, or urinal?: A Lot Help from  another person bathing (including washing, rinsing, drying)?: A Lot Help from another person to put on and taking off regular upper body clothing?: A Little Help from another person to put on and taking off regular lower body clothing?: A Lot 6 Click Score: 16   End of Session Equipment Utilized During Treatment: Gait belt;Rolling walker (2 wheels)  Activity Tolerance: Patient tolerated treatment well Patient left: in chair;with call bell/phone within reach;with family/visitor present  OT Visit Diagnosis: Unsteadiness on feet (R26.81);Other abnormalities of gait and mobility (R26.89);Muscle weakness (generalized) (M62.81);Pain;History of falling  (Z91.81) Pain - Right/Left: Right Pain - part of body: Knee                Time: 8784-8754 OT Time Calculation (min): 30 min Charges:  OT General Charges $OT Visit: 1 Visit OT Evaluation $OT Eval Moderate Complexity: 1 Mod OT Treatments $Self Care/Home Management : 8-22 mins  Mliss HERO, OTR/L Acute Rehabilitation Services Office: [phone]   Kennth Mliss Helling 02/24/2024, 1:45 PM

## 2024-02-26 DIAGNOSIS — Z85828 Personal history of other malignant neoplasm of skin: Secondary | ICD-10-CM | POA: Diagnosis not present

## 2024-02-26 DIAGNOSIS — Z8781 Personal history of (healed) traumatic fracture: Secondary | ICD-10-CM | POA: Diagnosis not present

## 2024-02-26 DIAGNOSIS — K219 Gastro-esophageal reflux disease without esophagitis: Secondary | ICD-10-CM | POA: Diagnosis not present

## 2024-02-26 DIAGNOSIS — D649 Anemia, unspecified: Secondary | ICD-10-CM | POA: Diagnosis not present

## 2024-02-26 DIAGNOSIS — M810 Age-related osteoporosis without current pathological fracture: Secondary | ICD-10-CM | POA: Diagnosis not present

## 2024-02-26 DIAGNOSIS — Z7901 Long term (current) use of anticoagulants: Secondary | ICD-10-CM | POA: Diagnosis not present

## 2024-02-26 DIAGNOSIS — I10 Essential (primary) hypertension: Secondary | ICD-10-CM | POA: Diagnosis not present

## 2024-02-26 DIAGNOSIS — Z853 Personal history of malignant neoplasm of breast: Secondary | ICD-10-CM | POA: Diagnosis not present

## 2024-02-26 DIAGNOSIS — I4891 Unspecified atrial fibrillation: Secondary | ICD-10-CM | POA: Diagnosis not present

## 2024-02-26 DIAGNOSIS — I499 Cardiac arrhythmia, unspecified: Secondary | ICD-10-CM | POA: Diagnosis not present

## 2024-02-26 DIAGNOSIS — I2510 Atherosclerotic heart disease of native coronary artery without angina pectoris: Secondary | ICD-10-CM | POA: Diagnosis not present

## 2024-02-26 DIAGNOSIS — E559 Vitamin D deficiency, unspecified: Secondary | ICD-10-CM | POA: Diagnosis not present

## 2024-02-26 DIAGNOSIS — Z96651 Presence of right artificial knee joint: Secondary | ICD-10-CM | POA: Diagnosis not present

## 2024-02-26 DIAGNOSIS — F419 Anxiety disorder, unspecified: Secondary | ICD-10-CM | POA: Diagnosis not present

## 2024-02-26 DIAGNOSIS — Z471 Aftercare following joint replacement surgery: Secondary | ICD-10-CM | POA: Diagnosis not present

## 2024-02-26 DIAGNOSIS — F4329 Adjustment disorder with other symptoms: Secondary | ICD-10-CM | POA: Diagnosis not present

## 2024-02-26 DIAGNOSIS — Z955 Presence of coronary angioplasty implant and graft: Secondary | ICD-10-CM | POA: Diagnosis not present

## 2024-02-26 DIAGNOSIS — E785 Hyperlipidemia, unspecified: Secondary | ICD-10-CM | POA: Diagnosis not present

## 2024-02-26 DIAGNOSIS — Z9089 Acquired absence of other organs: Secondary | ICD-10-CM | POA: Diagnosis not present

## 2024-02-26 DIAGNOSIS — I252 Old myocardial infarction: Secondary | ICD-10-CM | POA: Diagnosis not present

## 2024-02-27 ENCOUNTER — Other Ambulatory Visit (INDEPENDENT_AMBULATORY_CARE_PROVIDER_SITE_OTHER): Payer: Self-pay

## 2024-02-27 ENCOUNTER — Ambulatory Visit (INDEPENDENT_AMBULATORY_CARE_PROVIDER_SITE_OTHER): Admitting: Physician Assistant

## 2024-02-27 ENCOUNTER — Encounter: Payer: Self-pay | Admitting: Physician Assistant

## 2024-02-27 VITALS — BP 116/70 | HR 76

## 2024-02-27 DIAGNOSIS — E559 Vitamin D deficiency, unspecified: Secondary | ICD-10-CM | POA: Diagnosis not present

## 2024-02-27 DIAGNOSIS — D649 Anemia, unspecified: Secondary | ICD-10-CM | POA: Diagnosis not present

## 2024-02-27 DIAGNOSIS — Z96651 Presence of right artificial knee joint: Secondary | ICD-10-CM

## 2024-02-27 DIAGNOSIS — Z9089 Acquired absence of other organs: Secondary | ICD-10-CM | POA: Diagnosis not present

## 2024-02-27 DIAGNOSIS — E785 Hyperlipidemia, unspecified: Secondary | ICD-10-CM | POA: Diagnosis not present

## 2024-02-27 DIAGNOSIS — F4329 Adjustment disorder with other symptoms: Secondary | ICD-10-CM | POA: Diagnosis not present

## 2024-02-27 DIAGNOSIS — I252 Old myocardial infarction: Secondary | ICD-10-CM | POA: Diagnosis not present

## 2024-02-27 DIAGNOSIS — I4891 Unspecified atrial fibrillation: Secondary | ICD-10-CM | POA: Diagnosis not present

## 2024-02-27 DIAGNOSIS — Z955 Presence of coronary angioplasty implant and graft: Secondary | ICD-10-CM | POA: Diagnosis not present

## 2024-02-27 DIAGNOSIS — F419 Anxiety disorder, unspecified: Secondary | ICD-10-CM | POA: Diagnosis not present

## 2024-02-27 DIAGNOSIS — K219 Gastro-esophageal reflux disease without esophagitis: Secondary | ICD-10-CM | POA: Diagnosis not present

## 2024-02-27 DIAGNOSIS — Z85828 Personal history of other malignant neoplasm of skin: Secondary | ICD-10-CM | POA: Diagnosis not present

## 2024-02-27 DIAGNOSIS — Z7901 Long term (current) use of anticoagulants: Secondary | ICD-10-CM | POA: Diagnosis not present

## 2024-02-27 DIAGNOSIS — I10 Essential (primary) hypertension: Secondary | ICD-10-CM | POA: Diagnosis not present

## 2024-02-27 DIAGNOSIS — M810 Age-related osteoporosis without current pathological fracture: Secondary | ICD-10-CM | POA: Diagnosis not present

## 2024-02-27 DIAGNOSIS — Z8781 Personal history of (healed) traumatic fracture: Secondary | ICD-10-CM | POA: Diagnosis not present

## 2024-02-27 DIAGNOSIS — Z471 Aftercare following joint replacement surgery: Secondary | ICD-10-CM | POA: Diagnosis not present

## 2024-02-27 DIAGNOSIS — I2510 Atherosclerotic heart disease of native coronary artery without angina pectoris: Secondary | ICD-10-CM | POA: Diagnosis not present

## 2024-02-27 DIAGNOSIS — I499 Cardiac arrhythmia, unspecified: Secondary | ICD-10-CM | POA: Diagnosis not present

## 2024-02-27 DIAGNOSIS — Z853 Personal history of malignant neoplasm of breast: Secondary | ICD-10-CM | POA: Diagnosis not present

## 2024-02-27 NOTE — Progress Notes (Signed)
 HPI: Sarah Hendricks comes in today status post right total knee arthroplasty 02/23/2024.  She states she had a fall this morning.  She states that she was reaching for her walker and it gave way on her.  She then is trying to get down the stairs today to come the office and had an episode where she became faint states it was last in 15 to 30 seconds.  She did not have any dizziness lightheadedness chest pain or shortness of breath.  She has refused to go to the ER multiple times.  She was advised by home PT triage and by the staff here at the office.  She comes in today to make sure that she has done nothing to injure the knee.  She feels the weakness is due to the fact that she is not eating much.  She had no appetite.  She is present today with her daughter.  Her daughter states she is doing really well until today.  Patient states that she is passing gas.  She has had a small bowel movement.  She is on chronic anticoagulation.  Physical exam: Blood pressure 116/70 General Well-developed well-nourished pleasant female in no acute acute distress presents in a wheelchair. Psych: Alert and oriented x 3 Respirations: Unlabored Abdomen soft nontender. Right knee: Moderate old drainage on the bandage.  No active drainage.  Staples well-approximated the incision no signs of dehiscence or infection.  Calf supple nontender.  Dorsiflexion plantarflexion right ankle intact.  Lacks full extension of the right knee by approximately 10 degrees and flexion to around 80 to 85 degrees.  Radiographs: Right knee 2 views: No acute fractures acute findings.  Total knee arthroplasty components well-seated.  Knee is well located.  Impression: Status post right total knee arthroplasty 02/23/2024  Plan: Discussed with the daughter and patient she may benefit from nutritional shakes like Ensure.  She will continue work on range of motion strengthening.  New bandage was applied she does have an Aquacel dressing at home that she  can apply when she gets home.  Will check on her tomorrow to see how she is doing overall.  Questions were encouraged and answered at length.

## 2024-02-29 ENCOUNTER — Telehealth: Payer: Self-pay

## 2024-02-29 DIAGNOSIS — E559 Vitamin D deficiency, unspecified: Secondary | ICD-10-CM | POA: Diagnosis not present

## 2024-02-29 DIAGNOSIS — Z9089 Acquired absence of other organs: Secondary | ICD-10-CM | POA: Diagnosis not present

## 2024-02-29 DIAGNOSIS — F419 Anxiety disorder, unspecified: Secondary | ICD-10-CM | POA: Diagnosis not present

## 2024-02-29 DIAGNOSIS — D649 Anemia, unspecified: Secondary | ICD-10-CM | POA: Diagnosis not present

## 2024-02-29 DIAGNOSIS — I252 Old myocardial infarction: Secondary | ICD-10-CM | POA: Diagnosis not present

## 2024-02-29 DIAGNOSIS — Z96651 Presence of right artificial knee joint: Secondary | ICD-10-CM | POA: Diagnosis not present

## 2024-02-29 DIAGNOSIS — E785 Hyperlipidemia, unspecified: Secondary | ICD-10-CM | POA: Diagnosis not present

## 2024-02-29 DIAGNOSIS — Z7901 Long term (current) use of anticoagulants: Secondary | ICD-10-CM | POA: Diagnosis not present

## 2024-02-29 DIAGNOSIS — I499 Cardiac arrhythmia, unspecified: Secondary | ICD-10-CM | POA: Diagnosis not present

## 2024-02-29 DIAGNOSIS — Z85828 Personal history of other malignant neoplasm of skin: Secondary | ICD-10-CM | POA: Diagnosis not present

## 2024-02-29 DIAGNOSIS — Z8781 Personal history of (healed) traumatic fracture: Secondary | ICD-10-CM | POA: Diagnosis not present

## 2024-02-29 DIAGNOSIS — I2510 Atherosclerotic heart disease of native coronary artery without angina pectoris: Secondary | ICD-10-CM | POA: Diagnosis not present

## 2024-02-29 DIAGNOSIS — Z955 Presence of coronary angioplasty implant and graft: Secondary | ICD-10-CM | POA: Diagnosis not present

## 2024-02-29 DIAGNOSIS — F4329 Adjustment disorder with other symptoms: Secondary | ICD-10-CM | POA: Diagnosis not present

## 2024-02-29 DIAGNOSIS — I10 Essential (primary) hypertension: Secondary | ICD-10-CM | POA: Diagnosis not present

## 2024-02-29 DIAGNOSIS — Z853 Personal history of malignant neoplasm of breast: Secondary | ICD-10-CM | POA: Diagnosis not present

## 2024-02-29 DIAGNOSIS — M810 Age-related osteoporosis without current pathological fracture: Secondary | ICD-10-CM | POA: Diagnosis not present

## 2024-02-29 DIAGNOSIS — I4891 Unspecified atrial fibrillation: Secondary | ICD-10-CM | POA: Diagnosis not present

## 2024-02-29 DIAGNOSIS — K219 Gastro-esophageal reflux disease without esophagitis: Secondary | ICD-10-CM | POA: Diagnosis not present

## 2024-02-29 DIAGNOSIS — Z471 Aftercare following joint replacement surgery: Secondary | ICD-10-CM | POA: Diagnosis not present

## 2024-02-29 NOTE — Telephone Encounter (Signed)
 Stacy with [facility] would like verbal orders for 4 x week for 1wk and 3 x week for 2wks.  CB# [phone].  Please advise.  Thank you.

## 2024-03-01 NOTE — Telephone Encounter (Signed)
 Verbal order given

## 2024-03-02 DIAGNOSIS — Z8781 Personal history of (healed) traumatic fracture: Secondary | ICD-10-CM | POA: Diagnosis not present

## 2024-03-02 DIAGNOSIS — I252 Old myocardial infarction: Secondary | ICD-10-CM | POA: Diagnosis not present

## 2024-03-02 DIAGNOSIS — Z85828 Personal history of other malignant neoplasm of skin: Secondary | ICD-10-CM | POA: Diagnosis not present

## 2024-03-02 DIAGNOSIS — I10 Essential (primary) hypertension: Secondary | ICD-10-CM | POA: Diagnosis not present

## 2024-03-02 DIAGNOSIS — Z853 Personal history of malignant neoplasm of breast: Secondary | ICD-10-CM | POA: Diagnosis not present

## 2024-03-02 DIAGNOSIS — I499 Cardiac arrhythmia, unspecified: Secondary | ICD-10-CM | POA: Diagnosis not present

## 2024-03-02 DIAGNOSIS — I4891 Unspecified atrial fibrillation: Secondary | ICD-10-CM | POA: Diagnosis not present

## 2024-03-02 DIAGNOSIS — E785 Hyperlipidemia, unspecified: Secondary | ICD-10-CM | POA: Diagnosis not present

## 2024-03-02 DIAGNOSIS — Z7901 Long term (current) use of anticoagulants: Secondary | ICD-10-CM | POA: Diagnosis not present

## 2024-03-02 DIAGNOSIS — I2510 Atherosclerotic heart disease of native coronary artery without angina pectoris: Secondary | ICD-10-CM | POA: Diagnosis not present

## 2024-03-02 DIAGNOSIS — K219 Gastro-esophageal reflux disease without esophagitis: Secondary | ICD-10-CM | POA: Diagnosis not present

## 2024-03-02 DIAGNOSIS — Z471 Aftercare following joint replacement surgery: Secondary | ICD-10-CM | POA: Diagnosis not present

## 2024-03-02 DIAGNOSIS — F419 Anxiety disorder, unspecified: Secondary | ICD-10-CM | POA: Diagnosis not present

## 2024-03-02 DIAGNOSIS — F4329 Adjustment disorder with other symptoms: Secondary | ICD-10-CM | POA: Diagnosis not present

## 2024-03-02 DIAGNOSIS — Z96651 Presence of right artificial knee joint: Secondary | ICD-10-CM | POA: Diagnosis not present

## 2024-03-02 DIAGNOSIS — Z955 Presence of coronary angioplasty implant and graft: Secondary | ICD-10-CM | POA: Diagnosis not present

## 2024-03-02 DIAGNOSIS — E559 Vitamin D deficiency, unspecified: Secondary | ICD-10-CM | POA: Diagnosis not present

## 2024-03-02 DIAGNOSIS — Z9089 Acquired absence of other organs: Secondary | ICD-10-CM | POA: Diagnosis not present

## 2024-03-02 DIAGNOSIS — M810 Age-related osteoporosis without current pathological fracture: Secondary | ICD-10-CM | POA: Diagnosis not present

## 2024-03-02 DIAGNOSIS — D649 Anemia, unspecified: Secondary | ICD-10-CM | POA: Diagnosis not present

## 2024-03-05 DIAGNOSIS — Z85828 Personal history of other malignant neoplasm of skin: Secondary | ICD-10-CM | POA: Diagnosis not present

## 2024-03-05 DIAGNOSIS — Z7901 Long term (current) use of anticoagulants: Secondary | ICD-10-CM | POA: Diagnosis not present

## 2024-03-05 DIAGNOSIS — F4329 Adjustment disorder with other symptoms: Secondary | ICD-10-CM | POA: Diagnosis not present

## 2024-03-05 DIAGNOSIS — I499 Cardiac arrhythmia, unspecified: Secondary | ICD-10-CM | POA: Diagnosis not present

## 2024-03-05 DIAGNOSIS — F419 Anxiety disorder, unspecified: Secondary | ICD-10-CM | POA: Diagnosis not present

## 2024-03-05 DIAGNOSIS — I10 Essential (primary) hypertension: Secondary | ICD-10-CM | POA: Diagnosis not present

## 2024-03-05 DIAGNOSIS — I252 Old myocardial infarction: Secondary | ICD-10-CM | POA: Diagnosis not present

## 2024-03-05 DIAGNOSIS — D649 Anemia, unspecified: Secondary | ICD-10-CM | POA: Diagnosis not present

## 2024-03-05 DIAGNOSIS — Z9089 Acquired absence of other organs: Secondary | ICD-10-CM | POA: Diagnosis not present

## 2024-03-05 DIAGNOSIS — I4891 Unspecified atrial fibrillation: Secondary | ICD-10-CM | POA: Diagnosis not present

## 2024-03-05 DIAGNOSIS — I2510 Atherosclerotic heart disease of native coronary artery without angina pectoris: Secondary | ICD-10-CM | POA: Diagnosis not present

## 2024-03-05 DIAGNOSIS — Z471 Aftercare following joint replacement surgery: Secondary | ICD-10-CM | POA: Diagnosis not present

## 2024-03-05 DIAGNOSIS — Z8781 Personal history of (healed) traumatic fracture: Secondary | ICD-10-CM | POA: Diagnosis not present

## 2024-03-05 DIAGNOSIS — K219 Gastro-esophageal reflux disease without esophagitis: Secondary | ICD-10-CM | POA: Diagnosis not present

## 2024-03-05 DIAGNOSIS — E559 Vitamin D deficiency, unspecified: Secondary | ICD-10-CM | POA: Diagnosis not present

## 2024-03-05 DIAGNOSIS — Z853 Personal history of malignant neoplasm of breast: Secondary | ICD-10-CM | POA: Diagnosis not present

## 2024-03-05 DIAGNOSIS — M810 Age-related osteoporosis without current pathological fracture: Secondary | ICD-10-CM | POA: Diagnosis not present

## 2024-03-05 DIAGNOSIS — Z955 Presence of coronary angioplasty implant and graft: Secondary | ICD-10-CM | POA: Diagnosis not present

## 2024-03-05 DIAGNOSIS — E785 Hyperlipidemia, unspecified: Secondary | ICD-10-CM | POA: Diagnosis not present

## 2024-03-05 DIAGNOSIS — Z96651 Presence of right artificial knee joint: Secondary | ICD-10-CM | POA: Diagnosis not present

## 2024-03-07 DIAGNOSIS — Z7901 Long term (current) use of anticoagulants: Secondary | ICD-10-CM | POA: Diagnosis not present

## 2024-03-07 DIAGNOSIS — Z9089 Acquired absence of other organs: Secondary | ICD-10-CM | POA: Diagnosis not present

## 2024-03-07 DIAGNOSIS — F419 Anxiety disorder, unspecified: Secondary | ICD-10-CM | POA: Diagnosis not present

## 2024-03-07 DIAGNOSIS — I10 Essential (primary) hypertension: Secondary | ICD-10-CM | POA: Diagnosis not present

## 2024-03-07 DIAGNOSIS — Z96651 Presence of right artificial knee joint: Secondary | ICD-10-CM | POA: Diagnosis not present

## 2024-03-07 DIAGNOSIS — I499 Cardiac arrhythmia, unspecified: Secondary | ICD-10-CM | POA: Diagnosis not present

## 2024-03-07 DIAGNOSIS — Z471 Aftercare following joint replacement surgery: Secondary | ICD-10-CM | POA: Diagnosis not present

## 2024-03-07 DIAGNOSIS — Z85828 Personal history of other malignant neoplasm of skin: Secondary | ICD-10-CM | POA: Diagnosis not present

## 2024-03-07 DIAGNOSIS — M810 Age-related osteoporosis without current pathological fracture: Secondary | ICD-10-CM | POA: Diagnosis not present

## 2024-03-07 DIAGNOSIS — E559 Vitamin D deficiency, unspecified: Secondary | ICD-10-CM | POA: Diagnosis not present

## 2024-03-07 DIAGNOSIS — Z955 Presence of coronary angioplasty implant and graft: Secondary | ICD-10-CM | POA: Diagnosis not present

## 2024-03-07 DIAGNOSIS — I252 Old myocardial infarction: Secondary | ICD-10-CM | POA: Diagnosis not present

## 2024-03-07 DIAGNOSIS — I2510 Atherosclerotic heart disease of native coronary artery without angina pectoris: Secondary | ICD-10-CM | POA: Diagnosis not present

## 2024-03-07 DIAGNOSIS — Z853 Personal history of malignant neoplasm of breast: Secondary | ICD-10-CM | POA: Diagnosis not present

## 2024-03-07 DIAGNOSIS — I4891 Unspecified atrial fibrillation: Secondary | ICD-10-CM | POA: Diagnosis not present

## 2024-03-07 DIAGNOSIS — F4329 Adjustment disorder with other symptoms: Secondary | ICD-10-CM | POA: Diagnosis not present

## 2024-03-07 DIAGNOSIS — K219 Gastro-esophageal reflux disease without esophagitis: Secondary | ICD-10-CM | POA: Diagnosis not present

## 2024-03-07 DIAGNOSIS — D649 Anemia, unspecified: Secondary | ICD-10-CM | POA: Diagnosis not present

## 2024-03-07 DIAGNOSIS — E785 Hyperlipidemia, unspecified: Secondary | ICD-10-CM | POA: Diagnosis not present

## 2024-03-07 DIAGNOSIS — Z8781 Personal history of (healed) traumatic fracture: Secondary | ICD-10-CM | POA: Diagnosis not present

## 2024-03-08 ENCOUNTER — Other Ambulatory Visit: Payer: Self-pay | Admitting: Radiology

## 2024-03-08 ENCOUNTER — Ambulatory Visit (INDEPENDENT_AMBULATORY_CARE_PROVIDER_SITE_OTHER): Admitting: Physician Assistant

## 2024-03-08 ENCOUNTER — Encounter: Payer: Self-pay | Admitting: Physician Assistant

## 2024-03-08 DIAGNOSIS — Z96651 Presence of right artificial knee joint: Secondary | ICD-10-CM

## 2024-03-08 MED ORDER — TRAMADOL HCL 50 MG PO TABS: 50.0000 mg | ORAL_TABLET | Freq: Four times a day (QID) | ORAL | 0 refills | Status: DC | PRN

## 2024-03-08 MED ORDER — METHOCARBAMOL 500 MG PO TABS: 500.0000 mg | ORAL_TABLET | Freq: Four times a day (QID) | ORAL | 1 refills | Status: DC | PRN

## 2024-03-08 NOTE — Progress Notes (Signed)
 HPI: Sarah Hendricks returns today 2 weeks status post right total knee arthroplasty.  She is overall doing well.  States she is taking tramadol  just twice daily states the pain is not severe.  If she is on chronic Eliquis .  She has had no fevers chills shortness of breath chest pain.  Physical exam: Right knee full extension and flexion to 95 degrees.  Surgical incisions healing well no signs of infection.  Well-approximated staples.  There is 2 Steri-Strips applied.  Right calf supple nontender dorsiflexion plantarflexion right ankle intact.  Impression: Status post right total knee arthroplasty  Plan: She will work on scar tissue mobilization.  Will have her continue home health PT until she can arrange transportation for outpatient therapy.  Refills on tramadol  and Robaxin were given today.  Follow-up in 1 month sooner if there is any questions or concerns.

## 2024-03-09 DIAGNOSIS — D649 Anemia, unspecified: Secondary | ICD-10-CM | POA: Diagnosis not present

## 2024-03-09 DIAGNOSIS — I4891 Unspecified atrial fibrillation: Secondary | ICD-10-CM | POA: Diagnosis not present

## 2024-03-09 DIAGNOSIS — Z471 Aftercare following joint replacement surgery: Secondary | ICD-10-CM | POA: Diagnosis not present

## 2024-03-09 DIAGNOSIS — Z853 Personal history of malignant neoplasm of breast: Secondary | ICD-10-CM | POA: Diagnosis not present

## 2024-03-09 DIAGNOSIS — Z7901 Long term (current) use of anticoagulants: Secondary | ICD-10-CM | POA: Diagnosis not present

## 2024-03-09 DIAGNOSIS — I499 Cardiac arrhythmia, unspecified: Secondary | ICD-10-CM | POA: Diagnosis not present

## 2024-03-09 DIAGNOSIS — M810 Age-related osteoporosis without current pathological fracture: Secondary | ICD-10-CM | POA: Diagnosis not present

## 2024-03-09 DIAGNOSIS — I10 Essential (primary) hypertension: Secondary | ICD-10-CM | POA: Diagnosis not present

## 2024-03-09 DIAGNOSIS — F4329 Adjustment disorder with other symptoms: Secondary | ICD-10-CM | POA: Diagnosis not present

## 2024-03-09 DIAGNOSIS — E785 Hyperlipidemia, unspecified: Secondary | ICD-10-CM | POA: Diagnosis not present

## 2024-03-09 DIAGNOSIS — I2510 Atherosclerotic heart disease of native coronary artery without angina pectoris: Secondary | ICD-10-CM | POA: Diagnosis not present

## 2024-03-09 DIAGNOSIS — Z85828 Personal history of other malignant neoplasm of skin: Secondary | ICD-10-CM | POA: Diagnosis not present

## 2024-03-09 DIAGNOSIS — K219 Gastro-esophageal reflux disease without esophagitis: Secondary | ICD-10-CM | POA: Diagnosis not present

## 2024-03-09 DIAGNOSIS — F419 Anxiety disorder, unspecified: Secondary | ICD-10-CM | POA: Diagnosis not present

## 2024-03-09 DIAGNOSIS — I252 Old myocardial infarction: Secondary | ICD-10-CM | POA: Diagnosis not present

## 2024-03-09 DIAGNOSIS — Z955 Presence of coronary angioplasty implant and graft: Secondary | ICD-10-CM | POA: Diagnosis not present

## 2024-03-09 DIAGNOSIS — Z96651 Presence of right artificial knee joint: Secondary | ICD-10-CM | POA: Diagnosis not present

## 2024-03-09 DIAGNOSIS — Z9089 Acquired absence of other organs: Secondary | ICD-10-CM | POA: Diagnosis not present

## 2024-03-09 DIAGNOSIS — Z8781 Personal history of (healed) traumatic fracture: Secondary | ICD-10-CM | POA: Diagnosis not present

## 2024-03-09 DIAGNOSIS — E559 Vitamin D deficiency, unspecified: Secondary | ICD-10-CM | POA: Diagnosis not present

## 2024-03-12 DIAGNOSIS — Z853 Personal history of malignant neoplasm of breast: Secondary | ICD-10-CM | POA: Diagnosis not present

## 2024-03-12 DIAGNOSIS — E785 Hyperlipidemia, unspecified: Secondary | ICD-10-CM | POA: Diagnosis not present

## 2024-03-12 DIAGNOSIS — Z471 Aftercare following joint replacement surgery: Secondary | ICD-10-CM | POA: Diagnosis not present

## 2024-03-12 DIAGNOSIS — Z96651 Presence of right artificial knee joint: Secondary | ICD-10-CM | POA: Diagnosis not present

## 2024-03-12 DIAGNOSIS — Z9089 Acquired absence of other organs: Secondary | ICD-10-CM | POA: Diagnosis not present

## 2024-03-12 DIAGNOSIS — F4329 Adjustment disorder with other symptoms: Secondary | ICD-10-CM | POA: Diagnosis not present

## 2024-03-12 DIAGNOSIS — I4891 Unspecified atrial fibrillation: Secondary | ICD-10-CM | POA: Diagnosis not present

## 2024-03-12 DIAGNOSIS — Z85828 Personal history of other malignant neoplasm of skin: Secondary | ICD-10-CM | POA: Diagnosis not present

## 2024-03-12 DIAGNOSIS — I2510 Atherosclerotic heart disease of native coronary artery without angina pectoris: Secondary | ICD-10-CM | POA: Diagnosis not present

## 2024-03-12 DIAGNOSIS — Z8781 Personal history of (healed) traumatic fracture: Secondary | ICD-10-CM | POA: Diagnosis not present

## 2024-03-12 DIAGNOSIS — K219 Gastro-esophageal reflux disease without esophagitis: Secondary | ICD-10-CM | POA: Diagnosis not present

## 2024-03-12 DIAGNOSIS — Z955 Presence of coronary angioplasty implant and graft: Secondary | ICD-10-CM | POA: Diagnosis not present

## 2024-03-12 DIAGNOSIS — Z7901 Long term (current) use of anticoagulants: Secondary | ICD-10-CM | POA: Diagnosis not present

## 2024-03-12 DIAGNOSIS — I10 Essential (primary) hypertension: Secondary | ICD-10-CM | POA: Diagnosis not present

## 2024-03-12 DIAGNOSIS — F419 Anxiety disorder, unspecified: Secondary | ICD-10-CM | POA: Diagnosis not present

## 2024-03-12 DIAGNOSIS — E559 Vitamin D deficiency, unspecified: Secondary | ICD-10-CM | POA: Diagnosis not present

## 2024-03-12 DIAGNOSIS — I252 Old myocardial infarction: Secondary | ICD-10-CM | POA: Diagnosis not present

## 2024-03-12 DIAGNOSIS — I499 Cardiac arrhythmia, unspecified: Secondary | ICD-10-CM | POA: Diagnosis not present

## 2024-03-12 DIAGNOSIS — M810 Age-related osteoporosis without current pathological fracture: Secondary | ICD-10-CM | POA: Diagnosis not present

## 2024-03-12 DIAGNOSIS — D649 Anemia, unspecified: Secondary | ICD-10-CM | POA: Diagnosis not present

## 2024-03-13 ENCOUNTER — Telehealth: Payer: Self-pay

## 2024-03-13 DIAGNOSIS — R933 Abnormal findings on diagnostic imaging of other parts of digestive tract: Secondary | ICD-10-CM

## 2024-03-13 NOTE — Telephone Encounter (Signed)
 Called and spoke to patient. She had her knee replacement surgery and is recovering. She is willing to schedule CT enterography but not until December. Patient scheduled for Wed, Dec 3rd at 9:30 am, to arrive at 8:30 am. Plan to be there 1 1/2 hours.  MyChart message to patient with appointment info.

## 2024-03-13 NOTE — Telephone Encounter (Signed)
-----   Message from [facility] sent at 02/15/2024 11:04 AM EDT ----- Regarding: ct enterography SA:   seen in the office 9-30  Can you contact her in about 1 month = Oct 30,  to see if she would be willing to do a CT enterography (Abnormal finding on GI tract imaging) at that time.  She declined doing it sooner, prefers to have her orthopedic surgery done first.  Thanks.

## 2024-03-14 DIAGNOSIS — I4891 Unspecified atrial fibrillation: Secondary | ICD-10-CM | POA: Diagnosis not present

## 2024-03-14 DIAGNOSIS — Z85828 Personal history of other malignant neoplasm of skin: Secondary | ICD-10-CM | POA: Diagnosis not present

## 2024-03-14 DIAGNOSIS — M810 Age-related osteoporosis without current pathological fracture: Secondary | ICD-10-CM | POA: Diagnosis not present

## 2024-03-14 DIAGNOSIS — Z96651 Presence of right artificial knee joint: Secondary | ICD-10-CM | POA: Diagnosis not present

## 2024-03-14 DIAGNOSIS — I252 Old myocardial infarction: Secondary | ICD-10-CM | POA: Diagnosis not present

## 2024-03-14 DIAGNOSIS — I10 Essential (primary) hypertension: Secondary | ICD-10-CM | POA: Diagnosis not present

## 2024-03-14 DIAGNOSIS — Z853 Personal history of malignant neoplasm of breast: Secondary | ICD-10-CM | POA: Diagnosis not present

## 2024-03-14 DIAGNOSIS — I499 Cardiac arrhythmia, unspecified: Secondary | ICD-10-CM | POA: Diagnosis not present

## 2024-03-14 DIAGNOSIS — Z955 Presence of coronary angioplasty implant and graft: Secondary | ICD-10-CM | POA: Diagnosis not present

## 2024-03-14 DIAGNOSIS — F4329 Adjustment disorder with other symptoms: Secondary | ICD-10-CM | POA: Diagnosis not present

## 2024-03-14 DIAGNOSIS — E559 Vitamin D deficiency, unspecified: Secondary | ICD-10-CM | POA: Diagnosis not present

## 2024-03-14 DIAGNOSIS — Z7901 Long term (current) use of anticoagulants: Secondary | ICD-10-CM | POA: Diagnosis not present

## 2024-03-14 DIAGNOSIS — Z8781 Personal history of (healed) traumatic fracture: Secondary | ICD-10-CM | POA: Diagnosis not present

## 2024-03-14 DIAGNOSIS — E785 Hyperlipidemia, unspecified: Secondary | ICD-10-CM | POA: Diagnosis not present

## 2024-03-14 DIAGNOSIS — D649 Anemia, unspecified: Secondary | ICD-10-CM | POA: Diagnosis not present

## 2024-03-14 DIAGNOSIS — K219 Gastro-esophageal reflux disease without esophagitis: Secondary | ICD-10-CM | POA: Diagnosis not present

## 2024-03-14 DIAGNOSIS — Z471 Aftercare following joint replacement surgery: Secondary | ICD-10-CM | POA: Diagnosis not present

## 2024-03-14 DIAGNOSIS — Z9089 Acquired absence of other organs: Secondary | ICD-10-CM | POA: Diagnosis not present

## 2024-03-14 DIAGNOSIS — I2510 Atherosclerotic heart disease of native coronary artery without angina pectoris: Secondary | ICD-10-CM | POA: Diagnosis not present

## 2024-03-14 DIAGNOSIS — F419 Anxiety disorder, unspecified: Secondary | ICD-10-CM | POA: Diagnosis not present

## 2024-03-16 DIAGNOSIS — I499 Cardiac arrhythmia, unspecified: Secondary | ICD-10-CM | POA: Diagnosis not present

## 2024-03-16 DIAGNOSIS — I2510 Atherosclerotic heart disease of native coronary artery without angina pectoris: Secondary | ICD-10-CM | POA: Diagnosis not present

## 2024-03-16 DIAGNOSIS — I4891 Unspecified atrial fibrillation: Secondary | ICD-10-CM | POA: Diagnosis not present

## 2024-03-16 DIAGNOSIS — F4329 Adjustment disorder with other symptoms: Secondary | ICD-10-CM | POA: Diagnosis not present

## 2024-03-16 DIAGNOSIS — Z9089 Acquired absence of other organs: Secondary | ICD-10-CM | POA: Diagnosis not present

## 2024-03-16 DIAGNOSIS — Z96651 Presence of right artificial knee joint: Secondary | ICD-10-CM | POA: Diagnosis not present

## 2024-03-16 DIAGNOSIS — Z471 Aftercare following joint replacement surgery: Secondary | ICD-10-CM | POA: Diagnosis not present

## 2024-03-16 DIAGNOSIS — M810 Age-related osteoporosis without current pathological fracture: Secondary | ICD-10-CM | POA: Diagnosis not present

## 2024-03-16 DIAGNOSIS — K219 Gastro-esophageal reflux disease without esophagitis: Secondary | ICD-10-CM | POA: Diagnosis not present

## 2024-03-16 DIAGNOSIS — Z8781 Personal history of (healed) traumatic fracture: Secondary | ICD-10-CM | POA: Diagnosis not present

## 2024-03-16 DIAGNOSIS — E785 Hyperlipidemia, unspecified: Secondary | ICD-10-CM | POA: Diagnosis not present

## 2024-03-16 DIAGNOSIS — Z955 Presence of coronary angioplasty implant and graft: Secondary | ICD-10-CM | POA: Diagnosis not present

## 2024-03-16 DIAGNOSIS — I10 Essential (primary) hypertension: Secondary | ICD-10-CM | POA: Diagnosis not present

## 2024-03-16 DIAGNOSIS — D649 Anemia, unspecified: Secondary | ICD-10-CM | POA: Diagnosis not present

## 2024-03-16 DIAGNOSIS — Z853 Personal history of malignant neoplasm of breast: Secondary | ICD-10-CM | POA: Diagnosis not present

## 2024-03-16 DIAGNOSIS — Z7901 Long term (current) use of anticoagulants: Secondary | ICD-10-CM | POA: Diagnosis not present

## 2024-03-16 DIAGNOSIS — Z85828 Personal history of other malignant neoplasm of skin: Secondary | ICD-10-CM | POA: Diagnosis not present

## 2024-03-16 DIAGNOSIS — I252 Old myocardial infarction: Secondary | ICD-10-CM | POA: Diagnosis not present

## 2024-03-16 DIAGNOSIS — E559 Vitamin D deficiency, unspecified: Secondary | ICD-10-CM | POA: Diagnosis not present

## 2024-03-16 DIAGNOSIS — F419 Anxiety disorder, unspecified: Secondary | ICD-10-CM | POA: Diagnosis not present

## 2024-03-19 ENCOUNTER — Encounter: Admitting: Orthopaedic Surgery

## 2024-03-19 ENCOUNTER — Encounter: Payer: Self-pay | Admitting: Radiology

## 2024-03-21 DIAGNOSIS — Z7901 Long term (current) use of anticoagulants: Secondary | ICD-10-CM | POA: Diagnosis not present

## 2024-03-21 DIAGNOSIS — E785 Hyperlipidemia, unspecified: Secondary | ICD-10-CM | POA: Diagnosis not present

## 2024-03-21 DIAGNOSIS — I4891 Unspecified atrial fibrillation: Secondary | ICD-10-CM | POA: Diagnosis not present

## 2024-03-21 DIAGNOSIS — F4329 Adjustment disorder with other symptoms: Secondary | ICD-10-CM | POA: Diagnosis not present

## 2024-03-21 DIAGNOSIS — Z955 Presence of coronary angioplasty implant and graft: Secondary | ICD-10-CM | POA: Diagnosis not present

## 2024-03-21 DIAGNOSIS — K219 Gastro-esophageal reflux disease without esophagitis: Secondary | ICD-10-CM | POA: Diagnosis not present

## 2024-03-21 DIAGNOSIS — I2510 Atherosclerotic heart disease of native coronary artery without angina pectoris: Secondary | ICD-10-CM | POA: Diagnosis not present

## 2024-03-21 DIAGNOSIS — Z853 Personal history of malignant neoplasm of breast: Secondary | ICD-10-CM | POA: Diagnosis not present

## 2024-03-21 DIAGNOSIS — E559 Vitamin D deficiency, unspecified: Secondary | ICD-10-CM | POA: Diagnosis not present

## 2024-03-21 DIAGNOSIS — Z8781 Personal history of (healed) traumatic fracture: Secondary | ICD-10-CM | POA: Diagnosis not present

## 2024-03-21 DIAGNOSIS — D649 Anemia, unspecified: Secondary | ICD-10-CM | POA: Diagnosis not present

## 2024-03-21 DIAGNOSIS — I499 Cardiac arrhythmia, unspecified: Secondary | ICD-10-CM | POA: Diagnosis not present

## 2024-03-21 DIAGNOSIS — Z85828 Personal history of other malignant neoplasm of skin: Secondary | ICD-10-CM | POA: Diagnosis not present

## 2024-03-21 DIAGNOSIS — Z96651 Presence of right artificial knee joint: Secondary | ICD-10-CM | POA: Diagnosis not present

## 2024-03-21 DIAGNOSIS — M810 Age-related osteoporosis without current pathological fracture: Secondary | ICD-10-CM | POA: Diagnosis not present

## 2024-03-21 DIAGNOSIS — Z9089 Acquired absence of other organs: Secondary | ICD-10-CM | POA: Diagnosis not present

## 2024-03-21 DIAGNOSIS — I10 Essential (primary) hypertension: Secondary | ICD-10-CM | POA: Diagnosis not present

## 2024-03-21 DIAGNOSIS — Z471 Aftercare following joint replacement surgery: Secondary | ICD-10-CM | POA: Diagnosis not present

## 2024-03-21 DIAGNOSIS — I252 Old myocardial infarction: Secondary | ICD-10-CM | POA: Diagnosis not present

## 2024-03-21 DIAGNOSIS — F419 Anxiety disorder, unspecified: Secondary | ICD-10-CM | POA: Diagnosis not present

## 2024-03-23 DIAGNOSIS — Z8781 Personal history of (healed) traumatic fracture: Secondary | ICD-10-CM | POA: Diagnosis not present

## 2024-03-23 DIAGNOSIS — Z7901 Long term (current) use of anticoagulants: Secondary | ICD-10-CM | POA: Diagnosis not present

## 2024-03-23 DIAGNOSIS — F4329 Adjustment disorder with other symptoms: Secondary | ICD-10-CM | POA: Diagnosis not present

## 2024-03-23 DIAGNOSIS — K219 Gastro-esophageal reflux disease without esophagitis: Secondary | ICD-10-CM | POA: Diagnosis not present

## 2024-03-23 DIAGNOSIS — Z853 Personal history of malignant neoplasm of breast: Secondary | ICD-10-CM | POA: Diagnosis not present

## 2024-03-23 DIAGNOSIS — Z96651 Presence of right artificial knee joint: Secondary | ICD-10-CM | POA: Diagnosis not present

## 2024-03-23 DIAGNOSIS — D649 Anemia, unspecified: Secondary | ICD-10-CM | POA: Diagnosis not present

## 2024-03-23 DIAGNOSIS — Z955 Presence of coronary angioplasty implant and graft: Secondary | ICD-10-CM | POA: Diagnosis not present

## 2024-03-23 DIAGNOSIS — E785 Hyperlipidemia, unspecified: Secondary | ICD-10-CM | POA: Diagnosis not present

## 2024-03-23 DIAGNOSIS — I499 Cardiac arrhythmia, unspecified: Secondary | ICD-10-CM | POA: Diagnosis not present

## 2024-03-23 DIAGNOSIS — F419 Anxiety disorder, unspecified: Secondary | ICD-10-CM | POA: Diagnosis not present

## 2024-03-23 DIAGNOSIS — I4891 Unspecified atrial fibrillation: Secondary | ICD-10-CM | POA: Diagnosis not present

## 2024-03-23 DIAGNOSIS — E559 Vitamin D deficiency, unspecified: Secondary | ICD-10-CM | POA: Diagnosis not present

## 2024-03-23 DIAGNOSIS — I252 Old myocardial infarction: Secondary | ICD-10-CM | POA: Diagnosis not present

## 2024-03-23 DIAGNOSIS — I2510 Atherosclerotic heart disease of native coronary artery without angina pectoris: Secondary | ICD-10-CM | POA: Diagnosis not present

## 2024-03-23 DIAGNOSIS — Z85828 Personal history of other malignant neoplasm of skin: Secondary | ICD-10-CM | POA: Diagnosis not present

## 2024-03-23 DIAGNOSIS — Z471 Aftercare following joint replacement surgery: Secondary | ICD-10-CM | POA: Diagnosis not present

## 2024-03-23 DIAGNOSIS — Z9089 Acquired absence of other organs: Secondary | ICD-10-CM | POA: Diagnosis not present

## 2024-03-23 DIAGNOSIS — I10 Essential (primary) hypertension: Secondary | ICD-10-CM | POA: Diagnosis not present

## 2024-03-23 DIAGNOSIS — M810 Age-related osteoporosis without current pathological fracture: Secondary | ICD-10-CM | POA: Diagnosis not present

## 2024-03-25 ENCOUNTER — Other Ambulatory Visit: Payer: Self-pay | Admitting: Internal Medicine

## 2024-03-27 DIAGNOSIS — I10 Essential (primary) hypertension: Secondary | ICD-10-CM | POA: Diagnosis not present

## 2024-03-27 DIAGNOSIS — Z471 Aftercare following joint replacement surgery: Secondary | ICD-10-CM | POA: Diagnosis not present

## 2024-03-27 DIAGNOSIS — Z8781 Personal history of (healed) traumatic fracture: Secondary | ICD-10-CM | POA: Diagnosis not present

## 2024-03-27 DIAGNOSIS — E559 Vitamin D deficiency, unspecified: Secondary | ICD-10-CM | POA: Diagnosis not present

## 2024-03-27 DIAGNOSIS — M810 Age-related osteoporosis without current pathological fracture: Secondary | ICD-10-CM | POA: Diagnosis not present

## 2024-03-27 DIAGNOSIS — F419 Anxiety disorder, unspecified: Secondary | ICD-10-CM | POA: Diagnosis not present

## 2024-03-27 DIAGNOSIS — I252 Old myocardial infarction: Secondary | ICD-10-CM | POA: Diagnosis not present

## 2024-03-27 DIAGNOSIS — I4891 Unspecified atrial fibrillation: Secondary | ICD-10-CM | POA: Diagnosis not present

## 2024-03-27 DIAGNOSIS — I2510 Atherosclerotic heart disease of native coronary artery without angina pectoris: Secondary | ICD-10-CM | POA: Diagnosis not present

## 2024-03-27 DIAGNOSIS — Z853 Personal history of malignant neoplasm of breast: Secondary | ICD-10-CM | POA: Diagnosis not present

## 2024-03-27 DIAGNOSIS — I499 Cardiac arrhythmia, unspecified: Secondary | ICD-10-CM | POA: Diagnosis not present

## 2024-03-27 DIAGNOSIS — Z9089 Acquired absence of other organs: Secondary | ICD-10-CM | POA: Diagnosis not present

## 2024-03-27 DIAGNOSIS — K219 Gastro-esophageal reflux disease without esophagitis: Secondary | ICD-10-CM | POA: Diagnosis not present

## 2024-03-27 DIAGNOSIS — F4329 Adjustment disorder with other symptoms: Secondary | ICD-10-CM | POA: Diagnosis not present

## 2024-03-27 DIAGNOSIS — Z955 Presence of coronary angioplasty implant and graft: Secondary | ICD-10-CM | POA: Diagnosis not present

## 2024-03-27 DIAGNOSIS — D649 Anemia, unspecified: Secondary | ICD-10-CM | POA: Diagnosis not present

## 2024-03-27 DIAGNOSIS — Z85828 Personal history of other malignant neoplasm of skin: Secondary | ICD-10-CM | POA: Diagnosis not present

## 2024-03-27 DIAGNOSIS — E785 Hyperlipidemia, unspecified: Secondary | ICD-10-CM | POA: Diagnosis not present

## 2024-03-27 DIAGNOSIS — Z96651 Presence of right artificial knee joint: Secondary | ICD-10-CM | POA: Diagnosis not present

## 2024-03-27 DIAGNOSIS — Z7901 Long term (current) use of anticoagulants: Secondary | ICD-10-CM | POA: Diagnosis not present

## 2024-03-29 ENCOUNTER — Encounter: Payer: Self-pay | Admitting: Gastroenterology

## 2024-03-30 ENCOUNTER — Telehealth: Payer: Self-pay | Admitting: Orthopaedic Surgery

## 2024-03-30 DIAGNOSIS — Z85828 Personal history of other malignant neoplasm of skin: Secondary | ICD-10-CM | POA: Diagnosis not present

## 2024-03-30 DIAGNOSIS — I499 Cardiac arrhythmia, unspecified: Secondary | ICD-10-CM | POA: Diagnosis not present

## 2024-03-30 DIAGNOSIS — I2510 Atherosclerotic heart disease of native coronary artery without angina pectoris: Secondary | ICD-10-CM | POA: Diagnosis not present

## 2024-03-30 DIAGNOSIS — E559 Vitamin D deficiency, unspecified: Secondary | ICD-10-CM | POA: Diagnosis not present

## 2024-03-30 DIAGNOSIS — K219 Gastro-esophageal reflux disease without esophagitis: Secondary | ICD-10-CM | POA: Diagnosis not present

## 2024-03-30 DIAGNOSIS — E785 Hyperlipidemia, unspecified: Secondary | ICD-10-CM | POA: Diagnosis not present

## 2024-03-30 DIAGNOSIS — I10 Essential (primary) hypertension: Secondary | ICD-10-CM | POA: Diagnosis not present

## 2024-03-30 DIAGNOSIS — Z96651 Presence of right artificial knee joint: Secondary | ICD-10-CM | POA: Diagnosis not present

## 2024-03-30 DIAGNOSIS — Z9089 Acquired absence of other organs: Secondary | ICD-10-CM | POA: Diagnosis not present

## 2024-03-30 DIAGNOSIS — F419 Anxiety disorder, unspecified: Secondary | ICD-10-CM | POA: Diagnosis not present

## 2024-03-30 DIAGNOSIS — M810 Age-related osteoporosis without current pathological fracture: Secondary | ICD-10-CM | POA: Diagnosis not present

## 2024-03-30 DIAGNOSIS — Z8781 Personal history of (healed) traumatic fracture: Secondary | ICD-10-CM | POA: Diagnosis not present

## 2024-03-30 DIAGNOSIS — D649 Anemia, unspecified: Secondary | ICD-10-CM | POA: Diagnosis not present

## 2024-03-30 DIAGNOSIS — Z955 Presence of coronary angioplasty implant and graft: Secondary | ICD-10-CM | POA: Diagnosis not present

## 2024-03-30 DIAGNOSIS — Z471 Aftercare following joint replacement surgery: Secondary | ICD-10-CM | POA: Diagnosis not present

## 2024-03-30 DIAGNOSIS — F4329 Adjustment disorder with other symptoms: Secondary | ICD-10-CM | POA: Diagnosis not present

## 2024-03-30 DIAGNOSIS — Z853 Personal history of malignant neoplasm of breast: Secondary | ICD-10-CM | POA: Diagnosis not present

## 2024-03-30 DIAGNOSIS — I4891 Unspecified atrial fibrillation: Secondary | ICD-10-CM | POA: Diagnosis not present

## 2024-03-30 DIAGNOSIS — I252 Old myocardial infarction: Secondary | ICD-10-CM | POA: Diagnosis not present

## 2024-03-30 DIAGNOSIS — Z7901 Long term (current) use of anticoagulants: Secondary | ICD-10-CM | POA: Diagnosis not present

## 2024-03-30 NOTE — Telephone Encounter (Signed)
 Stacy from [facility] called to let you know that her ankle is now swollen to the point where she can't put pressure on it. It hurts to walk. CB#[phone]

## 2024-03-30 NOTE — Telephone Encounter (Signed)
 HH aware of the below message and she states she did tell the patient the same Patient denies any calf pain

## 2024-04-02 ENCOUNTER — Ambulatory Visit: Admitting: Physician Assistant

## 2024-04-02 ENCOUNTER — Encounter: Payer: Self-pay | Admitting: Physician Assistant

## 2024-04-02 DIAGNOSIS — Z96651 Presence of right artificial knee joint: Secondary | ICD-10-CM

## 2024-04-02 NOTE — Progress Notes (Signed)
 HPI: Sarah Hendricks returns today now 6 weeks status post right total knee arthroplasty.  She is overall doing well.  He states she is really no pain in the knee whatsoever.  Main complaint is swelling right leg.  She is wearing compression stockings.  Swelling improves overnight and then returns throughout the day.  She is taking no medications in regards to her knee at this point in time.  Using no assistive device to ambulate.  Physical exam: General Well-developed well-nourished female in no acute distress mood and affect appropriate.  Psych alert and oriented x 3 Right knee full extension full flexion.  No instability valgus varus stressing surgical incisions healing well.  Right calf supple nontender.  Nonpitting edema right lower leg.  Dorsiflexion plantarflexion right ankle intact.  Impression: Status post right total knee arthroplasty 02/23/2024  Plan: Discussed with her that I saw no signs of gout today.  However she does develop signs of acute gout which I reviewed with her she can call the office and we can send medication again.  Feel that most of the swelling in the right leg is due to the surgery and should dissipate with time.  Questions were encouraged and answered.  Will see her back in 6 months postop and at that time we obtain an AP and lateral view of her right knee.  She will follow-up with us  sooner if there is any questions concerns.

## 2024-04-03 NOTE — Therapy (Signed)
 OUTPATIENT PHYSICAL THERAPY LOWER EXTREMITY EVALUATION   Patient Name: Sarah Hendricks MRN: [id number] DOB:1945-11-14, 78 y.o., female Today's Date: 04/04/2024  END OF SESSION:  PT End of Session - 04/04/24 1139     Visit Number 1    Date for Recertification  05/30/24    Authorization Type BCBS MCR    Progress Note Due on Visit 10    PT Start Time 0934    PT Stop Time 1017    PT Time Calculation (min) 43 min    Activity Tolerance Patient tolerated treatment well    Behavior During Therapy [facility] for tasks assessed/performed          Past Medical History:  Diagnosis Date   Allergy    Anxiety    Arthritis    Basal cell carcinoma (BCC) of left side of nose 2008   Bimalleolar ankle fracture, right, closed, initial encounter    Breast cancer (HCC) 1994   lumpectomy and XRT, no chemo   CAD (coronary artery disease) 2008   PTCA, in [place]   Dysrhythmia    Atrial fibrillation   GERD (gastroesophageal reflux disease)    History of chicken pox    Hyperlipidemia    Hypertension    Myocardial infarction (HCC) 03/15/2007   stent placed   Vitamin D  deficiency    Past Surgical History:  Procedure Laterality Date   BREAST BIOPSY Left 1994   BREAST LUMPECTOMY Left 1994   CARDIOVERSION N/A 07/15/2023   Procedure: CARDIOVERSION;  Surgeon: Loni Soyla LABOR, MD;  Location: [facility]VASIVE [facility];  Service: Cardiovascular;  Laterality: N/A;   CATARACT EXTRACTION, BILATERAL     COLONOSCOPY N/A 11/17/2023   Procedure: COLONOSCOPY;  Surgeon: Leigh Elspeth SQUIBB, MD;  Location: [facility] ENDOSCOPY;  Service: Gastroenterology;  Laterality: N/A;   CORONARY ANGIOPLASTY WITH STENT PLACEMENT  2008   CRANIECTOMY FOR DEPRESSED SKULL FRACTURE  1986   ENTEROSCOPY N/A 11/16/2023   Procedure: ENTEROSCOPY;  Surgeon: Leigh Elspeth SQUIBB, MD;  Location: [facility] ENDOSCOPY;  Service: Gastroenterology;  Laterality: N/A;   ORIF ANKLE FRACTURE Right 06/20/2023   Procedure: RIGHT OPEN REDUCTION INTERNAL FIXATION (ORIF)  ANKLE FRACTURE;  Surgeon: Genelle Elspeth, MD;  Location: [facility];  Service: Orthopedics;  Laterality: Right;   TONSILLECTOMY  1966   TOTAL KNEE ARTHROPLASTY Right 02/23/2024   Procedure: ARTHROPLASTY, KNEE, TOTAL;  Surgeon: Vernetta Lonni GRADE, MD;  Location: [facility] OR;  Service: Orthopedics;  Laterality: Right;  Needs RNFA   TUBAL LIGATION     Patient Active Problem List   Diagnosis Date Noted   Status post total right knee replacement 02/23/2024   Unilateral primary osteoarthritis, right knee 01/04/2024   Abdominal pain 12/05/2023   Heme positive stool 11/16/2023   Anticoagulated 11/16/2023   Anemia 11/15/2023   DOE (dyspnea on exertion) 11/14/2023   Bimalleolar ankle fracture, right, closed, initial encounter 06/20/2023   Atrial fibrillation (HCC) 05/30/2023   Closed fracture of right ankle 05/30/2023   Fall, initial encounter 05/29/2023   Localized swelling of left lower leg 03/02/2023   Left hip pain 05/25/2022   Right knee pain 05/25/2022   Left leg pain 05/25/2022   Hyponatremia 02/15/2022   Stress and adjustment reaction 01/04/2020   Prediabetes 05/26/2017   Osteoporosis 05/30/2014   CAD (coronary artery disease)    Hypertension    Hyperlipidemia    History of breast cancer    GERD (gastroesophageal reflux disease)    Vitamin D  deficiency     PCP:  Geofm Glade PARAS, MD    REFERRING PROVIDER: Gretta Bertrum ORN, PA-C  REFERRING DIAG: [phone] (ICD-10-CM) - Status post total right knee replacement  THERAPY DIAG:  Stiffness of right knee, not elsewhere classified  Chronic pain of right knee  Other abnormalities of gait and mobility  Localized edema  Rationale for Evaluation and Treatment: Rehabilitation  ONSET DATE: Surgery Date 02/23/2024  SUBJECTIVE:   SUBJECTIVE STATEMENT: Patient presents status post right TKA 02/23/2024. She reports increased swelling in her right leg, feet, and hands. She had a follow up appointment with her surgeon  on Monday and he said she is doing great. She reports increased knee pain when she is doing her HEP exercises but the pan goes away once she is done completing them. She was able to verbalized and demonstrate exercises. She has not taken pain medication in 10 days.   PERTINENT HISTORY: Rt RKA 02/23/2024; Hx Lt breast lumpectomy 1994; Rt ankle ORIF 06/2023 ; HTN; Anxiety PAIN:  Are you having pain? Yes: NPRS scale: 0(currently) 9(worst) /10 Pain location: medial right knee (this is where majority of the arthritis was) Pain description: achy Aggravating factors: Doing exercises Relieving factors: Rest  PRECAUTIONS: Other: post op TKA  RED FLAGS: None   WEIGHT BEARING RESTRICTIONS: No  FALLS:  Has patient fallen in last 6 months? No but she reports a history of falls  LIVING ENVIRONMENT: Lives with: lives with their spouse Lives in: House/apartment Stairs: Yes stairs but she does not have to go up them Has following equipment at home: shower chair  OCCUPATION: Retired  PLOF: Independent, Independent with basic ADLs, Independent with household mobility without device, Independent with community mobility without device, Independent with gait, Independent with transfers, and Leisure: Dealer; playing games on ipad  PATIENT GOALS: To get better  NEXT MD VISIT: 4 months; March 2026  OBJECTIVE:  Note: Objective measures were completed at Evaluation unless otherwise noted.    PATIENT SURVEYS:  LEFS:43/80 53.8%  COGNITION: Overall cognitive status: Within functional limits for tasks assessed     SENSATION: WFL  EDEMA:  Edema noted medial knee compartment   MUSCLE LENGTH: Hamstrings: decreased on right   POSTURE: rounded shoulders and forward head  PALPATION: Increased muscle spasms right hamstring & calf and patient reported tenderness Decreased superior/inferior right patella  LOWER EXTREMITY ROM:  Active ROM Right eval Left eval  Hip flexion    Hip extension     Hip abduction    Hip adduction    Hip internal rotation    Hip external rotation    Knee flexion A/ROM 108 PROM 115   Knee extension -2   Ankle dorsiflexion    Ankle plantarflexion    Ankle inversion    Ankle eversion     (Blank rows = not tested)  LOWER EXTREMITY MMT: Grossly 4- to 4/5 bilateral    FUNCTIONAL TESTS:  5 times sit to stand: 16.76 sec hands on knees Timed up and go (TUG): 12.99sec 6 minute walk test: 53feet  GAIT: Distance walked: 534 feet Assistive device utilized: None Level of assistance: Complete Independence Comments: narrow BOS; some stances of left lateral trunk deviation  TREATMENT DATE: 04/04/2024 Initial Evaluation & HEP created     PATIENT EDUCATION:  Education details: PT eval findings, anticipated POC, progress with PT, and initial HEP Person educated: Patient and Spouse Education method: Explanation, Demonstration, and Handouts Education comprehension: verbalized understanding, returned demonstration, and needs further education  HOME EXERCISE PROGRAM: Access Code: [email or web address] URL: [email or web address] Date: 04/04/2024 Prepared by: Kristeen Sar  Exercises - Active Straight Leg Raise with Quad Set  - 1 x daily - 7 x weekly - 2 sets - 10 reps - Gastroc Stretch on Wall  - 1 x daily - 7 x weekly - 2 sets - 20-30s hold - Soleus Stretch on Wall  - 1 x daily - 7 x weekly - 2 sets - 20-30s hold - Gastroc Stretch with Foot at Wall  - 1 x daily - 7 x weekly - 2 sets - 20-30s hold - Sit to Stand  - 1 x daily - 7 x weekly - 1-2 sets - 10 reps  ASSESSMENT:  CLINICAL IMPRESSION: Patient is a 78 y.o. female who was seen today for physical therapy evaluation and treatment for right knee pain s/p right TKA. Cy presents 6 weeks post a right knee replacement. She did home health therapy and progressed very well.  She had a follow up appointment with the surgeon on Monday and he is pleased with her progress. Today she achieve right knee A/ROM fo 108 degrees and only lacks 2 degrees of full extension. She is compliant with HEP and was able to demonstrate and verbalize all of her exercises. Noted increased muscle spasms on right distal hamstring and proximal calf. Patient has been performing scar tissue massage to her incision. She is highly motivated and wants to return to full function. Patient will benefit from skilled PT to address the below impairments and improve overall function.   OBJECTIVE IMPAIRMENTS: Abnormal gait, decreased balance, decreased endurance, difficulty walking, decreased ROM, decreased strength, hypomobility, increased edema, increased muscle spasms, impaired flexibility, postural dysfunction, and pain.   ACTIVITY LIMITATIONS: carrying, lifting, bending, sitting, standing, squatting, stairs, transfers, bathing, dressing, and locomotion level  PARTICIPATION LIMITATIONS: meal prep, cleaning, laundry, interpersonal relationship, shopping, community activity, and church  PERSONAL FACTORS: Age, Fitness, Time since onset of injury/illness/exacerbation, and 3+ comorbidities: Hx Lt breast lumpectomy 1994; HTN; Anxiety are also affecting patient's functional outcome.   REHAB POTENTIAL: Good  CLINICAL DECISION MAKING: Evolving/moderate complexity  EVALUATION COMPLEXITY: Moderate   GOALS: Goals reviewed with patient? Yes  SHORT TERM GOALS: Target date: 05/02/2024  Patient will be independent with initial HEP. Baseline:  Goal status: INITIAL  2.  Patient will report > or = to 40% improvement in ability to perform ADLs and functional tasks since starting PT. Baseline:  Goal status: INITIAL   LONG TERM GOALS: Target date: 05/30/2024  Patient will demonstrate independence in advanced HEP. Baseline:  Goal status: INITIAL  2.  Patient will perform 5STS in < or = to 12 sec due to  improved RLE strength and decreased pain. Baseline: 16.76 Goal status: INITIAL  3.  Patient will be able to negotiate to curb safety for improved community mobility & ambulation.  Baseline:  Goal status: INITIAL   4.  Patient will score > or= to 55/80 on LEFS due to improved RLE function and strength. Baseline: LEFS:43/80 53.8% Goal status: INITIAL  5.  Patient will demonstrate > or = to 4+/5 right knee strength to prevent falls and improve performance of ADLS. Baseline:  Goal status: INITIAL  6.  Patient will ambulate > or= to 635feet during for improved community negotiation.  Baseline: 528feet  Goal status: INITIAL   PLAN:  PT FREQUENCY: 2x/week  PT DURATION: 8 weeks  PLANNED INTERVENTIONS: 97164- PT Re-evaluation, 97110-Therapeutic exercises, 97530- Therapeutic activity, 97112- Neuromuscular re-education, 97535- Self Care, 02859- Manual therapy, [phone]- Gait training, [phone]- Canalith repositioning, [id number]- Aquatic Therapy, [phone]- Electrical stimulation (unattended), [phone]- Electrical stimulation (manual), [id number]- Vasopneumatic device, [id number]- Ionotophoresis 4mg /ml Dexamethasone, 20560 (1-2 muscles), 20561 (3+ muscles)- Dry Needling, Patient/Family education, Balance training, Stair training, Taping, Joint mobilization, Joint manipulation, Spinal manipulation, Spinal mobilization, Scar mobilization, Vestibular training, Cryotherapy, and Moist heat  PLAN FOR NEXT SESSION: Review HEP; NuStep; LAQ with weight; hurdles; step ups; quad rehab    Kristeen Sar, PT, DPT 04/04/24 11:40 AM [facility] [address] [place], [place] [address] Phone # [phone] Fax [phone]

## 2024-04-04 ENCOUNTER — Other Ambulatory Visit: Payer: Self-pay

## 2024-04-04 ENCOUNTER — Encounter: Payer: Self-pay | Admitting: Physical Therapy

## 2024-04-04 ENCOUNTER — Ambulatory Visit: Attending: Physician Assistant | Admitting: Physical Therapy

## 2024-04-04 DIAGNOSIS — M25661 Stiffness of right knee, not elsewhere classified: Secondary | ICD-10-CM | POA: Diagnosis not present

## 2024-04-04 DIAGNOSIS — R600 Localized edema: Secondary | ICD-10-CM | POA: Diagnosis not present

## 2024-04-04 DIAGNOSIS — G8929 Other chronic pain: Secondary | ICD-10-CM | POA: Insufficient documentation

## 2024-04-04 DIAGNOSIS — Z96651 Presence of right artificial knee joint: Secondary | ICD-10-CM | POA: Diagnosis not present

## 2024-04-04 DIAGNOSIS — M25561 Pain in right knee: Secondary | ICD-10-CM | POA: Diagnosis not present

## 2024-04-04 DIAGNOSIS — R2689 Other abnormalities of gait and mobility: Secondary | ICD-10-CM | POA: Insufficient documentation

## 2024-04-09 ENCOUNTER — Ambulatory Visit: Admitting: Physician Assistant

## 2024-04-11 ENCOUNTER — Ambulatory Visit: Payer: Self-pay | Admitting: Physical Therapy

## 2024-04-17 ENCOUNTER — Encounter (HOSPITAL_BASED_OUTPATIENT_CLINIC_OR_DEPARTMENT_OTHER): Payer: Self-pay

## 2024-04-17 ENCOUNTER — Ambulatory Visit: Payer: Self-pay | Admitting: Physical Therapy

## 2024-04-18 ENCOUNTER — Ambulatory Visit (HOSPITAL_BASED_OUTPATIENT_CLINIC_OR_DEPARTMENT_OTHER)
Admission: RE | Admit: 2024-04-18 | Discharge: 2024-04-18 | Disposition: A | Source: Ambulatory Visit | Attending: Gastroenterology

## 2024-04-18 DIAGNOSIS — R933 Abnormal findings on diagnostic imaging of other parts of digestive tract: Secondary | ICD-10-CM | POA: Insufficient documentation

## 2024-04-18 DIAGNOSIS — K8020 Calculus of gallbladder without cholecystitis without obstruction: Secondary | ICD-10-CM | POA: Diagnosis not present

## 2024-04-18 MED ORDER — IOHEXOL 300 MG/ML  SOLN
100.0000 mL | Freq: Once | INTRAMUSCULAR | Status: AC | PRN
  Administered 2024-04-18: 100 mL via INTRAVENOUS

## 2024-04-20 ENCOUNTER — Ambulatory Visit: Payer: Self-pay | Admitting: Physical Therapy

## 2024-04-22 ENCOUNTER — Ambulatory Visit: Payer: Self-pay | Admitting: Gastroenterology

## 2024-04-22 DIAGNOSIS — R933 Abnormal findings on diagnostic imaging of other parts of digestive tract: Secondary | ICD-10-CM

## 2024-04-22 DIAGNOSIS — D509 Iron deficiency anemia, unspecified: Secondary | ICD-10-CM

## 2024-04-24 ENCOUNTER — Ambulatory Visit: Admitting: Physical Therapy

## 2024-04-24 ENCOUNTER — Encounter: Payer: Self-pay | Admitting: Physical Therapy

## 2024-04-24 DIAGNOSIS — R600 Localized edema: Secondary | ICD-10-CM | POA: Insufficient documentation

## 2024-04-24 DIAGNOSIS — M25561 Pain in right knee: Secondary | ICD-10-CM | POA: Diagnosis present

## 2024-04-24 DIAGNOSIS — R2689 Other abnormalities of gait and mobility: Secondary | ICD-10-CM | POA: Insufficient documentation

## 2024-04-24 DIAGNOSIS — G8929 Other chronic pain: Secondary | ICD-10-CM | POA: Diagnosis present

## 2024-04-24 DIAGNOSIS — M25661 Stiffness of right knee, not elsewhere classified: Secondary | ICD-10-CM | POA: Insufficient documentation

## 2024-04-24 NOTE — Therapy (Signed)
 OUTPATIENT PHYSICAL THERAPY LOWER EXTREMITY TREATMENT   Patient Name: Sarah Hendricks MRN: [id number] DOB:[birth year], 78 y.o., female Today's Date: 04/24/2024  END OF SESSION:  PT End of Session - 04/24/24 1313     Visit Number 2    Date for Recertification  05/30/24    Authorization Type BCBS MCR    Progress Note Due on Visit 10    PT Start Time 1230    PT Stop Time 1311    PT Time Calculation (min) 41 min    Activity Tolerance Patient tolerated treatment well    Behavior During Therapy WFL for tasks assessed/performed           Past Medical History:  Diagnosis Date   Allergy    Anxiety    Arthritis    Basal cell carcinoma (BCC) of left side of nose 2008   Bimalleolar ankle fracture, right, closed, initial encounter    Breast cancer (HCC) 1994   lumpectomy and XRT, no chemo   CAD (coronary artery disease) 2008   [place], in [place]   Dysrhythmia    Atrial fibrillation   GERD (gastroesophageal reflux disease)    History of chicken pox    Hyperlipidemia    Hypertension    Myocardial infarction (HCC) 03/15/2007   stent placed   Vitamin D  deficiency    Past Surgical History:  Procedure Laterality Date   BREAST BIOPSY Left 1994   BREAST LUMPECTOMY Left 1994   CARDIOVERSION N/A 07/15/2023   Procedure: CARDIOVERSION;  Surgeon: Loni Soyla LABOR, MD;  Location: [facility];  Service: Cardiovascular;  Laterality: N/A;   CATARACT EXTRACTION, BILATERAL     COLONOSCOPY N/A 11/17/2023   Procedure: COLONOSCOPY;  Surgeon: Leigh Elspeth SQUIBB, MD;  Location: [facility] ENDOSCOPY;  Service: Gastroenterology;  Laterality: N/A;   CORONARY ANGIOPLASTY WITH STENT PLACEMENT  2008   CRANIECTOMY FOR DEPRESSED SKULL FRACTURE  1986   ENTEROSCOPY N/A 11/16/2023   Procedure: ENTEROSCOPY;  Surgeon: Leigh Elspeth SQUIBB, MD;  Location: [facility] ENDOSCOPY;  Service: Gastroenterology;  Laterality: N/A;   ORIF ANKLE FRACTURE Right 06/20/2023   Procedure: RIGHT OPEN REDUCTION INTERNAL FIXATION (ORIF)  ANKLE FRACTURE;  Surgeon: Genelle Elspeth, MD;  Location: [facility];  Service: Orthopedics;  Laterality: Right;   TONSILLECTOMY  1966   TOTAL KNEE ARTHROPLASTY Right 02/23/2024   Procedure: ARTHROPLASTY, KNEE, TOTAL;  Surgeon: Vernetta Lonni GRADE, MD;  Location: [facility] OR;  Service: Orthopedics;  Laterality: Right;  Needs RNFA   TUBAL LIGATION     Patient Active Problem List   Diagnosis Date Noted   Status post total right knee replacement 02/23/2024   Unilateral primary osteoarthritis, right knee 01/04/2024   Abdominal pain 12/05/2023   Heme positive stool 11/16/2023   Anticoagulated 11/16/2023   Anemia 11/15/2023   DOE (dyspnea on exertion) 11/14/2023   Bimalleolar ankle fracture, right, closed, initial encounter 06/20/2023   Atrial fibrillation (HCC) 05/30/2023   Closed fracture of right ankle 05/30/2023   Fall, initial encounter 05/29/2023   Localized swelling of left lower leg 03/02/2023   Left hip pain 05/25/2022   Right knee pain 05/25/2022   Left leg pain 05/25/2022   Hyponatremia 02/15/2022   Stress and adjustment reaction 01/04/2020   Prediabetes 05/26/2017   Osteoporosis 05/30/2014   CAD (coronary artery disease)    Hypertension    Hyperlipidemia    History of breast cancer    GERD (gastroesophageal reflux disease)    Vitamin D  deficiency     PCP:  Geofm Glade PARAS, MD    REFERRING PROVIDER: Gretta Bertrum ORN, PA-C  REFERRING DIAG: [phone] (ICD-10-CM) - Status post total right knee replacement  THERAPY DIAG:  Stiffness of right knee, not elsewhere classified  Chronic pain of right knee  Other abnormalities of gait and mobility  Localized edema  Rationale for Evaluation and Treatment: Rehabilitation  ONSET DATE: Surgery Date 02/23/2024  SUBJECTIVE:   SUBJECTIVE STATEMENT: Patient reports her knee feels stiff today. She has not been able to do exercises because of having a virus.   From Eval: Patient presents status post right TKA  02/23/2024. She reports increased swelling in her right leg, feet, and hands. She had a follow up appointment with her surgeon on Monday and he said she is doing great. She reports increased knee pain when she is doing her HEP exercises but the pan goes away once she is done completing them. She was able to verbalized and demonstrate exercises. She has not taken pain medication in 10 days.   PERTINENT HISTORY: Rt RKA 02/23/2024; Hx Lt breast lumpectomy 1994; Rt ankle ORIF 06/2023 ; HTN; Anxiety PAIN:  Are you having pain? Yes: NPRS scale: 0(currently) 9(worst) /10 Pain location: medial right knee (this is where majority of the arthritis was) Pain description: achy Aggravating factors: Doing exercises Relieving factors: Rest  PRECAUTIONS: Other: post op TKA  RED FLAGS: None   WEIGHT BEARING RESTRICTIONS: No  FALLS:  Has patient fallen in last 6 months? No but she reports a history of falls  LIVING ENVIRONMENT: Lives with: lives with their spouse Lives in: House/apartment Stairs: Yes stairs but she does not have to go up them Has following equipment at home: shower chair  OCCUPATION: Retired  PLOF: Independent, Independent with basic ADLs, Independent with household mobility without device, Independent with community mobility without device, Independent with gait, Independent with transfers, and Leisure: Dealer; playing games on ipad  PATIENT GOALS: To get better  NEXT MD VISIT: 4 months; March 2026  OBJECTIVE:  Note: Objective measures were completed at Evaluation unless otherwise noted.    PATIENT SURVEYS:  LEFS:43/80 53.8%  COGNITION: Overall cognitive status: Within functional limits for tasks assessed     SENSATION: WFL  EDEMA:  Edema noted medial knee compartment   MUSCLE LENGTH: Hamstrings: decreased on right   POSTURE: rounded shoulders and forward head  PALPATION: Increased muscle spasms right hamstring & calf and patient reported tenderness Decreased  superior/inferior right patella  LOWER EXTREMITY ROM:  Active ROM Right eval Left eval  Hip flexion    Hip extension    Hip abduction    Hip adduction    Hip internal rotation    Hip external rotation    Knee flexion A/ROM 108 PROM 115   Knee extension -2   Ankle dorsiflexion    Ankle plantarflexion    Ankle inversion    Ankle eversion     (Blank rows = not tested)  LOWER EXTREMITY MMT: Grossly 4- to 4/5 bilateral    FUNCTIONAL TESTS:  5 times sit to stand: 16.76 sec hands on knees Timed up and go (TUG): 12.99sec 6 minute walk test: 55feet  GAIT: Distance walked: 534 feet Assistive device utilized: None Level of assistance: Complete Independence Comments: narrow BOS; some stances of left lateral trunk deviation  TREATMENT DATE:  04/24/2024 NuStep Level 2 5 mins - PT presents to discuss status Seated hamstring stretch 2 x 30 sec  Seated knee flexion with slider x 20 Seated LAQ 0# 2 x 10 Rt  Supine Quad Set 2 x 10 Rt  Supine SLR 2 x 10 Rt Hooklying hip adduction Seated hip abduction with black loop Seated hamstring curl with green TB x 10 Rt  Sit to stand from plinth 2 x 5  Step ups at stair x 10 bilateral with UE support Hurdles (forwards & sideways) x 3 laps each at barre Calf stretch with rockerboard 3 x 30 sec   04/04/2024 Initial Evaluation & HEP created     PATIENT EDUCATION:  Education details: PT eval findings, anticipated POC, progress with PT, and initial HEP Person educated: Patient and Spouse Education method: Explanation, Demonstration, and Handouts Education comprehension: verbalized understanding, returned demonstration, and needs further education  HOME EXERCISE PROGRAM: Access Code: [email or web address] URL: [email or web address] Date: 04/04/2024 Prepared by: Kristeen Sar  Exercises - Active Straight Leg  Raise with Quad Set  - 1 x daily - 7 x weekly - 2 sets - 10 reps - Gastroc Stretch on Wall  - 1 x daily - 7 x weekly - 2 sets - 20-30s hold - Soleus Stretch on Wall  - 1 x daily - 7 x weekly - 2 sets - 20-30s hold - Gastroc Stretch with Foot at Wall  - 1 x daily - 7 x weekly - 2 sets - 20-30s hold - Sit to Stand  - 1 x daily - 7 x weekly - 1-2 sets - 10 reps  ASSESSMENT:  CLINICAL IMPRESSION: Cy presents to first follow up appointment since evaluation. She has been under the weather recently, so there has been a small lapse in appointments. She verbalized poor compliance with HEP , so reviewed exercises to ensure proper performance. She has not had any pain recently. Treatment session focused on functional strengthening and right knee mobility. PT monitored patient response throughout session and provided modifications as needed. Patient will benefit from skilled PT to address the below impairments and improve overall function.    OBJECTIVE IMPAIRMENTS: Abnormal gait, decreased balance, decreased endurance, difficulty walking, decreased ROM, decreased strength, hypomobility, increased edema, increased muscle spasms, impaired flexibility, postural dysfunction, and pain.   ACTIVITY LIMITATIONS: carrying, lifting, bending, sitting, standing, squatting, stairs, transfers, bathing, dressing, and locomotion level  PARTICIPATION LIMITATIONS: meal prep, cleaning, laundry, interpersonal relationship, shopping, community activity, and church  PERSONAL FACTORS: Age, Fitness, Time since onset of injury/illness/exacerbation, and 3+ comorbidities: Hx Lt breast lumpectomy 1994; HTN; Anxiety are also affecting patient's functional outcome.   REHAB POTENTIAL: Good  CLINICAL DECISION MAKING: Evolving/moderate complexity  EVALUATION COMPLEXITY: Moderate   GOALS: Goals reviewed with patient? Yes  SHORT TERM GOALS: Target date: 05/02/2024  Patient will be independent with initial HEP. Baseline:  Goal  status: INITIAL  2.  Patient will report > or = to 40% improvement in ability to perform ADLs and functional tasks since starting PT. Baseline:  Goal status: INITIAL   LONG TERM GOALS: Target date: 05/30/2024  Patient will demonstrate independence in advanced HEP. Baseline:  Goal status: INITIAL  2.  Patient will perform 5STS in < or = to 12 sec due to improved RLE strength and decreased pain. Baseline: 16.76 Goal status: INITIAL  3.  Patient will be able to negotiate to curb safety for improved community mobility & ambulation.  Baseline:  Goal status: INITIAL   4.  Patient will score > or= to 55/80 on LEFS due to improved RLE function and strength. Baseline: LEFS:43/80 53.8% Goal status: INITIAL  5.  Patient will demonstrate > or = to 4+/5 right knee strength to prevent falls and improve performance of ADLS. Baseline:  Goal status: INITIAL  6.  Patient will ambulate > or= to 631feet during for improved community negotiation.  Baseline: 512feet  Goal status: INITIAL   PLAN:  PT FREQUENCY: 2x/week  PT DURATION: 8 weeks  PLANNED INTERVENTIONS: 97164- PT Re-evaluation, 97110-Therapeutic exercises, 97530- Therapeutic activity, 97112- Neuromuscular re-education, 97535- Self Care, 02859- Manual therapy, [phone]- Gait training, [phone]- Canalith repositioning, [id number]- Aquatic Therapy, [phone]- Electrical stimulation (unattended), [phone]- Electrical stimulation (manual), [id number]- Vasopneumatic device, [id number]- Ionotophoresis 4mg /ml Dexamethasone , 79439 (1-2 muscles), [id number] (3+ muscles)- Dry Needling, Patient/Family education, Balance training, Stair training, Taping, Joint mobilization, Joint manipulation, Spinal manipulation, Spinal mobilization, Scar mobilization, Vestibular training, Cryotherapy, and Moist heat  PLAN FOR NEXT SESSION: assess tolerance to treatment session; progress as able; step ups; quad strengthening; balance    Kristeen Sar, PT, DPT 04/24/24 1:14 PM

## 2024-04-26 ENCOUNTER — Ambulatory Visit: Payer: Self-pay | Admitting: Gastroenterology

## 2024-04-26 ENCOUNTER — Ambulatory Visit: Admitting: Physical Therapy

## 2024-04-26 ENCOUNTER — Encounter: Payer: Self-pay | Admitting: Physical Therapy

## 2024-04-26 ENCOUNTER — Other Ambulatory Visit (INDEPENDENT_AMBULATORY_CARE_PROVIDER_SITE_OTHER)

## 2024-04-26 DIAGNOSIS — R2689 Other abnormalities of gait and mobility: Secondary | ICD-10-CM

## 2024-04-26 DIAGNOSIS — R933 Abnormal findings on diagnostic imaging of other parts of digestive tract: Secondary | ICD-10-CM | POA: Diagnosis not present

## 2024-04-26 DIAGNOSIS — D509 Iron deficiency anemia, unspecified: Secondary | ICD-10-CM

## 2024-04-26 DIAGNOSIS — G8929 Other chronic pain: Secondary | ICD-10-CM

## 2024-04-26 DIAGNOSIS — M25661 Stiffness of right knee, not elsewhere classified: Secondary | ICD-10-CM | POA: Diagnosis not present

## 2024-04-26 DIAGNOSIS — R600 Localized edema: Secondary | ICD-10-CM

## 2024-04-26 LAB — CBC WITH DIFFERENTIAL/PLATELET
Basophils Absolute: 0.1 K/uL (ref 0.0–0.1)
Basophils Relative: 1.3 % (ref 0.0–3.0)
Eosinophils Absolute: 0.3 K/uL (ref 0.0–0.7)
Eosinophils Relative: 5 % (ref 0.0–5.0)
HCT: 34.3 % — ABNORMAL LOW (ref 36.0–46.0)
Hemoglobin: 11.7 g/dL — ABNORMAL LOW (ref 12.0–15.0)
Lymphocytes Relative: 15.6 % (ref 12.0–46.0)
Lymphs Abs: 0.9 K/uL (ref 0.7–4.0)
MCHC: 34 g/dL (ref 30.0–36.0)
MCV: 96.1 fl (ref 78.0–100.0)
Monocytes Absolute: 0.6 K/uL (ref 0.1–1.0)
Monocytes Relative: 9.9 % (ref 3.0–12.0)
Neutro Abs: 4.1 K/uL (ref 1.4–7.7)
Neutrophils Relative %: 68.2 % (ref 43.0–77.0)
Platelets: 312 K/uL (ref 150.0–400.0)
RBC: 3.57 Mil/uL — ABNORMAL LOW (ref 3.87–5.11)
RDW: 14.6 % (ref 11.5–15.5)
WBC: 6.1 K/uL (ref 4.0–10.5)

## 2024-04-26 LAB — IBC + FERRITIN
Ferritin: 50.9 ng/mL (ref 10.0–291.0)
Iron: 78 ug/dL (ref 42–145)
Saturation Ratios: 22.2 % (ref 20.0–50.0)
TIBC: 351.4 ug/dL (ref 250.0–450.0)
Transferrin: 251 mg/dL (ref 212.0–360.0)

## 2024-04-26 NOTE — Therapy (Signed)
 OUTPATIENT PHYSICAL THERAPY LOWER EXTREMITY TREATMENT   Patient Name: Sarah Hendricks MRN: [id number] DOB:09-10-1945, 78 y.o., female Today's Date: 04/26/2024  END OF SESSION:  PT End of Session - 04/26/24 1052     Visit Number 3    Date for Recertification  05/30/24    Authorization Type BCBS MCR    Progress Note Due on Visit 10    PT Start Time 0937    PT Stop Time 1016    PT Time Calculation (min) 39 min    Activity Tolerance Patient tolerated treatment well    Behavior During Therapy [facility] for tasks assessed/performed            Past Medical History:  Diagnosis Date   Allergy    Anxiety    Arthritis    Basal cell carcinoma (BCC) of left side of nose 2008   Bimalleolar ankle fracture, right, closed, initial encounter    Breast cancer (HCC) 1994   lumpectomy and XRT, no chemo   CAD (coronary artery disease) 2008   [place], in [place]   Dysrhythmia    Atrial fibrillation   GERD (gastroesophageal reflux disease)    History of chicken pox    Hyperlipidemia    Hypertension    Myocardial infarction (HCC) 03/15/2007   stent placed   Vitamin D  deficiency    Past Surgical History:  Procedure Laterality Date   BREAST BIOPSY Left 1994   BREAST LUMPECTOMY Left 1994   CARDIOVERSION N/A 07/15/2023   Procedure: CARDIOVERSION;  Surgeon: Loni Soyla LABOR, MD;  Location: [facility];  Service: Cardiovascular;  Laterality: N/A;   CATARACT EXTRACTION, BILATERAL     COLONOSCOPY N/A 11/17/2023   Procedure: COLONOSCOPY;  Surgeon: Leigh Elspeth SQUIBB, MD;  Location: [facility] ENDOSCOPY;  Service: Gastroenterology;  Laterality: N/A;   CORONARY ANGIOPLASTY WITH STENT PLACEMENT  2008   CRANIECTOMY FOR DEPRESSED SKULL FRACTURE  1986   ENTEROSCOPY N/A 11/16/2023   Procedure: ENTEROSCOPY;  Surgeon: Leigh Elspeth SQUIBB, MD;  Location: [facility] ENDOSCOPY;  Service: Gastroenterology;  Laterality: N/A;   ORIF ANKLE FRACTURE Right 06/20/2023   Procedure: RIGHT OPEN REDUCTION INTERNAL FIXATION  (ORIF) ANKLE FRACTURE;  Surgeon: Genelle Elspeth, MD;  Location: [facility];  Service: Orthopedics;  Laterality: Right;   TONSILLECTOMY  1966   TOTAL KNEE ARTHROPLASTY Right 02/23/2024   Procedure: ARTHROPLASTY, KNEE, TOTAL;  Surgeon: Vernetta Lonni GRADE, MD;  Location: [facility] OR;  Service: Orthopedics;  Laterality: Right;  Needs RNFA   TUBAL LIGATION     Patient Active Problem List   Diagnosis Date Noted   Status post total right knee replacement 02/23/2024   Unilateral primary osteoarthritis, right knee 01/04/2024   Abdominal pain 12/05/2023   Heme positive stool 11/16/2023   Anticoagulated 11/16/2023   Anemia 11/15/2023   DOE (dyspnea on exertion) 11/14/2023   Bimalleolar ankle fracture, right, closed, initial encounter 06/20/2023   Atrial fibrillation (HCC) 05/30/2023   Closed fracture of right ankle 05/30/2023   Fall, initial encounter 05/29/2023   Localized swelling of left lower leg 03/02/2023   Left hip pain 05/25/2022   Right knee pain 05/25/2022   Left leg pain 05/25/2022   Hyponatremia 02/15/2022   Stress and adjustment reaction 01/04/2020   Prediabetes 05/26/2017   Osteoporosis 05/30/2014   CAD (coronary artery disease)    Hypertension    Hyperlipidemia    History of breast cancer    GERD (gastroesophageal reflux disease)    Vitamin D  deficiency     PCP:  Geofm Glade PARAS, MD    REFERRING PROVIDER: Gretta Bertrum ORN, PA-C  REFERRING DIAG: [phone] (ICD-10-CM) - Status post total right knee replacement  THERAPY DIAG:  Stiffness of right knee, not elsewhere classified  Chronic pain of right knee  Other abnormalities of gait and mobility  Localized edema  Rationale for Evaluation and Treatment: Rehabilitation  ONSET DATE: Surgery Date 02/23/2024  SUBJECTIVE:   SUBJECTIVE STATEMENT: My knee has never felt better. The swelling has improved.  From Eval: Patient presents status post right TKA 02/23/2024. She reports increased swelling in  her right leg, feet, and hands. She had a follow up appointment with her surgeon on Monday and he said she is doing great. She reports increased knee pain when she is doing her HEP exercises but the pan goes away once she is done completing them. She was able to verbalized and demonstrate exercises. She has not taken pain medication in 10 days.   PERTINENT HISTORY: Rt RKA 02/23/2024; Hx Lt breast lumpectomy 1994; Rt ankle ORIF 06/2023 ; HTN; Anxiety PAIN:  Are you having pain? Yes: NPRS scale: 0(currently) 9(worst) /10 Pain location: medial right knee (this is where majority of the arthritis was) Pain description: achy Aggravating factors: Doing exercises Relieving factors: Rest  PRECAUTIONS: Other: post op TKA  RED FLAGS: None   WEIGHT BEARING RESTRICTIONS: No  FALLS:  Has patient fallen in last 6 months? No but she reports a history of falls  LIVING ENVIRONMENT: Lives with: lives with their spouse Lives in: House/apartment Stairs: Yes stairs but she does not have to go up them Has following equipment at home: shower chair  OCCUPATION: Retired  PLOF: Independent, Independent with basic ADLs, Independent with household mobility without device, Independent with community mobility without device, Independent with gait, Independent with transfers, and Leisure: Dealer; playing games on ipad  PATIENT GOALS: To get better  NEXT MD VISIT: 4 months; March 2026  OBJECTIVE:  Note: Objective measures were completed at Evaluation unless otherwise noted.    PATIENT SURVEYS:  LEFS:43/80 53.8%  COGNITION: Overall cognitive status: Within functional limits for tasks assessed     SENSATION: WFL  EDEMA:  Edema noted medial knee compartment   MUSCLE LENGTH: Hamstrings: decreased on right   POSTURE: rounded shoulders and forward head  PALPATION: Increased muscle spasms right hamstring & calf and patient reported tenderness Decreased superior/inferior right patella  LOWER  EXTREMITY ROM:  Active ROM Right eval Left eval  Hip flexion    Hip extension    Hip abduction    Hip adduction    Hip internal rotation    Hip external rotation    Knee flexion A/ROM 108 PROM 115   Knee extension -2   Ankle dorsiflexion    Ankle plantarflexion    Ankle inversion    Ankle eversion     (Blank rows = not tested)  LOWER EXTREMITY MMT: Grossly 4- to 4/5 bilateral    FUNCTIONAL TESTS:  5 times sit to stand: 16.76 sec hands on knees Timed up and go (TUG): 12.99sec 6 minute walk test: 554feet  GAIT: Distance walked: 534 feet Assistive device utilized: None Level of assistance: Complete Independence Comments: narrow BOS; some stances of left lateral trunk deviation  TREATMENT DATE:  04/26/2024 NuStep Level 3 5 mins - PT presents to discuss status Seated knee flexion with slider x 20 Seated LAQ 2# 2 x 10 Rt  Seated march with 2# 2 x 10 bilateral  Supine SLR 2 x 10 Rt Manual: patellar mobility in all directions for improved knee joint ROM. STM with manual roller to right quads and TFL to decrease muscle spasms and improve knee mobility. Patient enjoyed this. Seated hip abduction with black loop x 20 Seated hamstring curl with green TB2  x 10 Rt  Step ups at stair x 10 bilateral with UE support Calf stretch with rockerboard 3 x 30 sec  Standing hamstring stretch at stair 2 x 30 sec      04/24/2024 NuStep Level 2 5 mins - PT presents to discuss status Seated hamstring stretch 2 x 30 sec  Seated knee flexion with slider x 20 Seated LAQ 0# 2 x 10 Rt  Supine Quad Set 2 x 10 Rt  Supine SLR 2 x 10 Rt Hooklying hip adduction Seated hip abduction with black loop Seated hamstring curl with green TB x 10 Rt  Sit to stand from plinth 2 x 5  Step ups at stair x 10 bilateral with UE support Hurdles (forwards & sideways) x 3 laps each  at barre Calf stretch with rockerboard 3 x 30 sec   04/04/2024 Initial Evaluation & HEP created     PATIENT EDUCATION:  Education details: PT eval findings, anticipated POC, progress with PT, and initial HEP Person educated: Patient and Spouse Education method: Explanation, Demonstration, and Handouts Education comprehension: verbalized understanding, returned demonstration, and needs further education  HOME EXERCISE PROGRAM: Access Code: [email or web address] URL: [email or web address] Date: 04/04/2024 Prepared by: Kristeen Sar  Exercises - Active Straight Leg Raise with Quad Set  - 1 x daily - 7 x weekly - 2 sets - 10 reps - Gastroc Stretch on Wall  - 1 x daily - 7 x weekly - 2 sets - 20-30s hold - Soleus Stretch on Wall  - 1 x daily - 7 x weekly - 2 sets - 20-30s hold - Gastroc Stretch with Foot at Wall  - 1 x daily - 7 x weekly - 2 sets - 20-30s hold - Sit to Stand  - 1 x daily - 7 x weekly - 1-2 sets - 10 reps  ASSESSMENT:  CLINICAL IMPRESSION: Cy verbalized feeling good after last treatment session. She said this was the best her knee has felt. Noted decreased superior and inferior right patellar mobility so spent time working on that today and muscle spasms in quad. Progressed strengthening exercises today and patient tolerated them well PT monitored patient's form throughout session and provided cues as needed. Overall, patient is progressing well post surgery.     OBJECTIVE IMPAIRMENTS: Abnormal gait, decreased balance, decreased endurance, difficulty walking, decreased ROM, decreased strength, hypomobility, increased edema, increased muscle spasms, impaired flexibility, postural dysfunction, and pain.   ACTIVITY LIMITATIONS: carrying, lifting, bending, sitting, standing, squatting, stairs, transfers, bathing, dressing, and locomotion level  PARTICIPATION LIMITATIONS: meal prep, cleaning, laundry, interpersonal relationship, shopping, community activity, and  church  PERSONAL FACTORS: Age, Fitness, Time since onset of injury/illness/exacerbation, and 3+ comorbidities: Hx Lt breast lumpectomy 1994; HTN; Anxiety are also affecting patient's functional outcome.   REHAB POTENTIAL: Good  CLINICAL DECISION MAKING: Evolving/moderate complexity  EVALUATION COMPLEXITY: Moderate   GOALS: Goals reviewed with patient? Yes  SHORT TERM GOALS: Target date: 05/02/2024  Patient will be independent with initial HEP.  Baseline:  Goal status: INITIAL  2.  Patient will report > or = to 40% improvement in ability to perform ADLs and functional tasks since starting PT. Baseline:  Goal status: INITIAL   LONG TERM GOALS: Target date: 05/30/2024  Patient will demonstrate independence in advanced HEP. Baseline:  Goal status: INITIAL  2.  Patient will perform 5STS in < or = to 12 sec due to improved RLE strength and decreased pain. Baseline: 16.76 Goal status: INITIAL  3.  Patient will be able to negotiate to curb safety for improved community mobility & ambulation.  Baseline:  Goal status: INITIAL   4.  Patient will score > or= to 55/80 on LEFS due to improved RLE function and strength. Baseline: LEFS:43/80 53.8% Goal status: INITIAL  5.  Patient will demonstrate > or = to 4+/5 right knee strength to prevent falls and improve performance of ADLS. Baseline:  Goal status: INITIAL  6.  Patient will ambulate > or= to 61feet during for improved community negotiation.  Baseline: 518feet  Goal status: INITIAL   PLAN:  PT FREQUENCY: 2x/week  PT DURATION: 8 weeks  PLANNED INTERVENTIONS: 97164- PT Re-evaluation, 97110-Therapeutic exercises, 97530- Therapeutic activity, 97112- Neuromuscular re-education, 97535- Self Care, 02859- Manual therapy, [phone]- Gait training, [phone]- Canalith repositioning, [id number]- Aquatic Therapy, [phone]- Electrical stimulation (unattended), [phone]- Electrical stimulation (manual), [id number]- Vasopneumatic device, [id number]-  Ionotophoresis 4mg /ml Dexamethasone , 79439 (1-2 muscles), 20561 (3+ muscles)- Dry Needling, Patient/Family education, Balance training, Stair training, Taping, Joint mobilization, Joint manipulation, Spinal manipulation, Spinal mobilization, Scar mobilization, Vestibular training, Cryotherapy, and Moist heat  PLAN FOR NEXT SESSION: progress as able; step ups; quad strengthening; balance    Kristeen Sar, PT, DPT 04/26/2024 10:53 AM

## 2024-04-30 ENCOUNTER — Ambulatory Visit: Admitting: Physical Therapy

## 2024-04-30 ENCOUNTER — Encounter: Payer: Self-pay | Admitting: Physical Therapy

## 2024-04-30 DIAGNOSIS — G8929 Other chronic pain: Secondary | ICD-10-CM

## 2024-04-30 DIAGNOSIS — R2689 Other abnormalities of gait and mobility: Secondary | ICD-10-CM

## 2024-04-30 DIAGNOSIS — R600 Localized edema: Secondary | ICD-10-CM

## 2024-04-30 DIAGNOSIS — M25661 Stiffness of right knee, not elsewhere classified: Secondary | ICD-10-CM | POA: Diagnosis not present

## 2024-04-30 NOTE — Therapy (Signed)
 OUTPATIENT PHYSICAL THERAPY LOWER EXTREMITY TREATMENT   Patient Name: Sarah Hendricks MRN: [id number] DOB:1945-07-12, 78 y.o., female Today's Date: 04/30/2024  END OF SESSION:  PT End of Session - 04/30/24 1126     Visit Number 4    Date for Recertification  05/30/24    Authorization Type [employer] MCR    Progress Note Due on Visit 10    PT Start Time 1018    PT Stop Time 1100    PT Time Calculation (min) 42 min    Activity Tolerance Patient tolerated treatment well    Behavior During Therapy WFL for tasks assessed/performed             Past Medical History:  Diagnosis Date   Allergy    Anxiety    Arthritis    Basal cell carcinoma (BCC) of left side of nose 2008   Bimalleolar ankle fracture, right, closed, initial encounter    Breast cancer (HCC) 1994   lumpectomy and XRT, no chemo   CAD (coronary artery disease) 2008   PTCA, in [place]   Dysrhythmia    Atrial fibrillation   GERD (gastroesophageal reflux disease)    History of chicken pox    Hyperlipidemia    Hypertension    Myocardial infarction (HCC) 03/15/2007   stent placed   Vitamin D  deficiency    Past Surgical History:  Procedure Laterality Date   BREAST BIOPSY Left 1994   BREAST LUMPECTOMY Left 1994   CARDIOVERSION N/A 07/15/2023   Procedure: CARDIOVERSION;  Surgeon: Loni Soyla LABOR, MD;  Location: [facility];  Service: Cardiovascular;  Laterality: N/A;   CATARACT EXTRACTION, BILATERAL     COLONOSCOPY N/A 11/17/2023   Procedure: COLONOSCOPY;  Surgeon: Leigh Elspeth SQUIBB, MD;  Location: [facility] ENDOSCOPY;  Service: Gastroenterology;  Laterality: N/A;   CORONARY ANGIOPLASTY WITH STENT PLACEMENT  2008   CRANIECTOMY FOR DEPRESSED SKULL FRACTURE  1986   ENTEROSCOPY N/A 11/16/2023   Procedure: ENTEROSCOPY;  Surgeon: Leigh Elspeth SQUIBB, MD;  Location: [facility] ENDOSCOPY;  Service: Gastroenterology;  Laterality: N/A;   ORIF ANKLE FRACTURE Right 06/20/2023   Procedure: RIGHT OPEN REDUCTION INTERNAL FIXATION  (ORIF) ANKLE FRACTURE;  Surgeon: Genelle Elspeth, MD;  Location: [facility];  Service: Orthopedics;  Laterality: Right;   TONSILLECTOMY  1966   TOTAL KNEE ARTHROPLASTY Right 02/23/2024   Procedure: ARTHROPLASTY, KNEE, TOTAL;  Surgeon: Vernetta Lonni GRADE, MD;  Location: [facility] OR;  Service: Orthopedics;  Laterality: Right;  Needs RNFA   TUBAL LIGATION     Patient Active Problem List   Diagnosis Date Noted   Status post total right knee replacement 02/23/2024   Unilateral primary osteoarthritis, right knee 01/04/2024   Abdominal pain 12/05/2023   Heme positive stool 11/16/2023   Anticoagulated 11/16/2023   Anemia 11/15/2023   DOE (dyspnea on exertion) 11/14/2023   Bimalleolar ankle fracture, right, closed, initial encounter 06/20/2023   Atrial fibrillation (HCC) 05/30/2023   Closed fracture of right ankle 05/30/2023   Fall, initial encounter 05/29/2023   Localized swelling of left lower leg 03/02/2023   Left hip pain 05/25/2022   Right knee pain 05/25/2022   Left leg pain 05/25/2022   Hyponatremia 02/15/2022   Stress and adjustment reaction 01/04/2020   Prediabetes 05/26/2017   Osteoporosis 05/30/2014   CAD (coronary artery disease)    Hypertension    Hyperlipidemia    History of breast cancer    GERD (gastroesophageal reflux disease)    Vitamin D  deficiency     PCP:  Geofm Glade PARAS, MD    REFERRING PROVIDER: Gretta Bertrum ORN, PA-C  REFERRING DIAG: [id number] (ICD-10-CM) - Status post total right knee replacement  THERAPY DIAG:  Stiffness of right knee, not elsewhere classified  Chronic pain of right knee  Other abnormalities of gait and mobility  Localized edema  Rationale for Evaluation and Treatment: Rehabilitation  ONSET DATE: Surgery Date 02/23/2024  SUBJECTIVE:   SUBJECTIVE STATEMENT: Patient reports her knee is sore today. She has been having sharp pains when laying down.  From Eval: Patient presents status post right TKA 02/23/2024. She  reports increased swelling in her right leg, feet, and hands. She had a follow up appointment with her surgeon on Monday and he said she is doing great. She reports increased knee pain when she is doing her HEP exercises but the pan goes away once she is done completing them. She was able to verbalized and demonstrate exercises. She has not taken pain medication in 10 days.   PERTINENT HISTORY: Rt RKA 02/23/2024; Hx Lt breast lumpectomy 1994; Rt ankle ORIF 06/2023 ; HTN; Anxiety PAIN:  Are you having pain? Yes: NPRS scale: 0(currently) 9(worst) /10 Pain location: medial right knee (this is where majority of the arthritis was) Pain description: achy Aggravating factors: Doing exercises Relieving factors: Rest  PRECAUTIONS: Other: post op TKA  RED FLAGS: None   WEIGHT BEARING RESTRICTIONS: No  FALLS:  Has patient fallen in last 6 months? No but she reports a history of falls  LIVING ENVIRONMENT: Lives with: lives with their spouse Lives in: House/apartment Stairs: Yes stairs but she does not have to go up them Has following equipment at home: shower chair  OCCUPATION: Retired  PLOF: Independent, Independent with basic ADLs, Independent with household mobility without device, Independent with community mobility without device, Independent with gait, Independent with transfers, and Leisure: Dealer; playing games on ipad  PATIENT GOALS: To get better  NEXT MD VISIT: 4 months; March 2026  OBJECTIVE:  Note: Objective measures were completed at Evaluation unless otherwise noted.    PATIENT SURVEYS:  LEFS:43/80 53.8%  COGNITION: Overall cognitive status: Within functional limits for tasks assessed     SENSATION: WFL  EDEMA:  Edema noted medial knee compartment   MUSCLE LENGTH: Hamstrings: decreased on right   POSTURE: rounded shoulders and forward head  PALPATION: Increased muscle spasms right hamstring & calf and patient reported tenderness Decreased  superior/inferior right patella  LOWER EXTREMITY ROM:  Active ROM Right eval Left eval  Hip flexion    Hip extension    Hip abduction    Hip adduction    Hip internal rotation    Hip external rotation    Knee flexion A/ROM 108 PROM 115   Knee extension -2   Ankle dorsiflexion    Ankle plantarflexion    Ankle inversion    Ankle eversion     (Blank rows = not tested)  LOWER EXTREMITY MMT: Grossly 4- to 4/5 bilateral    FUNCTIONAL TESTS:  5 times sit to stand: 16.76 sec hands on knees Timed up and go (TUG): 12.99sec 6 minute walk test: 528feet  GAIT: Distance walked: 534 feet Assistive device utilized: None Level of assistance: Complete Independence Comments: narrow BOS; some stances of left lateral trunk deviation  TREATMENT DATE:  04/30/2024 NuStep Level 3 5 mins - PT presents to discuss status Seated knee flexion with slider x 20 Seated LAQ 2# 2 x 10 Rt  Seated march with 2# 2 x 10 bilateral  Knee flexion stretch at stair 5 x 10 sec hold Sit to stand x 10 then staggered with Rt behind x 10 (hands on knees) Side stepping at barre with yellow loop around ankles x 3  6 inch step ups (forwards & lateral) x 12 bilateral each direction (UE support at barre) Standing heel raise on airex x 20 Standing march on airex 2 x 10 bilateral (light hand support at barre) Standing hamstring curl 2 x 10 bilateral  Calf stretch with rockerboard 3 x 30 sec    04/26/2024 NuStep Level 3 5 mins - PT presents to discuss status Seated knee flexion with slider x 20 Seated LAQ 2# 2 x 10 Rt  Seated march with 2# 2 x 10 bilateral  Supine SLR 2 x 10 Rt Manual: patellar mobility in all directions for improved knee joint ROM. STM with manual roller to right quads and TFL to decrease muscle spasms and improve knee mobility. Patient enjoyed this. Seated hip  abduction with black loop x 20 Seated hamstring curl with green TB2  x 10 Rt  Step ups at stair x 10 bilateral with UE support Calf stretch with rockerboard 3 x 30 sec  Standing hamstring stretch at stair 2 x 30 sec      04/24/2024 NuStep Level 2 5 mins - PT presents to discuss status Seated hamstring stretch 2 x 30 sec  Seated knee flexion with slider x 20 Seated LAQ 0# 2 x 10 Rt  Supine Quad Set 2 x 10 Rt  Supine SLR 2 x 10 Rt Hooklying hip adduction Seated hip abduction with black loop Seated hamstring curl with green TB x 10 Rt  Sit to stand from plinth 2 x 5  Step ups at stair x 10 bilateral with UE support Hurdles (forwards & sideways) x 3 laps each at barre Calf stretch with rockerboard 3 x 30 sec   04/04/2024 Initial Evaluation & HEP created     PATIENT EDUCATION:  Education details: PT eval findings, anticipated POC, progress with PT, and initial HEP Person educated: Patient and Spouse Education method: Explanation, Demonstration, and Handouts Education comprehension: verbalized understanding, returned demonstration, and needs further education  HOME EXERCISE PROGRAM: Access Code: [email or web address] URL: [email or web address] Date: 04/04/2024 Prepared by: Kristeen Sar  Exercises - Active Straight Leg Raise with Quad Set  - 1 x daily - 7 x weekly - 2 sets - 10 reps - Gastroc Stretch on Wall  - 1 x daily - 7 x weekly - 2 sets - 20-30s hold - Soleus Stretch on Wall  - 1 x daily - 7 x weekly - 2 sets - 20-30s hold - Gastroc Stretch with Foot at Wall  - 1 x daily - 7 x weekly - 2 sets - 20-30s hold - Sit to Stand  - 1 x daily - 7 x weekly - 1-2 sets - 10 reps  ASSESSMENT:  CLINICAL IMPRESSION: Cy verbalized increased knee soreness after last session and she has had instances of having sharp pains when laying down. Exercise intensity was increased last session so educated patient that is probably the source of her soreness. Incorporated staggered sit to stands  and she was challenged when performing on the right. She is progressing well post surgery. PT monitored patient response throughout  and provided cues as needed. Patient will benefit from skilled PT to address the below impairments and improve overall function.      OBJECTIVE IMPAIRMENTS: Abnormal gait, decreased balance, decreased endurance, difficulty walking, decreased ROM, decreased strength, hypomobility, increased edema, increased muscle spasms, impaired flexibility, postural dysfunction, and pain.   ACTIVITY LIMITATIONS: carrying, lifting, bending, sitting, standing, squatting, stairs, transfers, bathing, dressing, and locomotion level  PARTICIPATION LIMITATIONS: meal prep, cleaning, laundry, interpersonal relationship, shopping, community activity, and church  PERSONAL FACTORS: Age, Fitness, Time since onset of injury/illness/exacerbation, and 3+ comorbidities: Hx Lt breast lumpectomy 1994; HTN; Anxiety are also affecting patient's functional outcome.   REHAB POTENTIAL: Good  CLINICAL DECISION MAKING: Evolving/moderate complexity  EVALUATION COMPLEXITY: Moderate   GOALS: Goals reviewed with patient? Yes  SHORT TERM GOALS: Target date: 05/02/2024  Patient will be independent with initial HEP. Baseline:  Goal status: INITIAL  2.  Patient will report > or = to 40% improvement in ability to perform ADLs and functional tasks since starting PT. Baseline:  Goal status: INITIAL   LONG TERM GOALS: Target date: 05/30/2024  Patient will demonstrate independence in advanced HEP. Baseline:  Goal status: INITIAL  2.  Patient will perform 5STS in < or = to 12 sec due to improved RLE strength and decreased pain. Baseline: 16.76 Goal status: INITIAL  3.  Patient will be able to negotiate to curb safety for improved community mobility & ambulation.  Baseline:  Goal status: INITIAL   4.  Patient will score > or= to 55/80 on LEFS due to improved RLE function and  strength. Baseline: LEFS:43/80 53.8% Goal status: INITIAL  5.  Patient will demonstrate > or = to 4+/5 right knee strength to prevent falls and improve performance of ADLS. Baseline:  Goal status: INITIAL  6.  Patient will ambulate > or= to 630feet during for improved community negotiation.  Baseline: 560feet  Goal status: INITIAL   PLAN:  PT FREQUENCY: 2x/week  PT DURATION: 8 weeks  PLANNED INTERVENTIONS: 97164- PT Re-evaluation, 97110-Therapeutic exercises, 97530- Therapeutic activity, 97112- Neuromuscular re-education, 97535- Self Care, [id number]- Manual therapy, [phone]- Gait training, [phone]- Canalith repositioning, [id number]- Aquatic Therapy, [phone]- Electrical stimulation (unattended), [phone]- Electrical stimulation (manual), [id number]- Vasopneumatic device, [id number]- Ionotophoresis 4mg /ml Dexamethasone , 79439 (1-2 muscles), [id number] (3+ muscles)- Dry Needling, Patient/Family education, Balance training, Stair training, Taping, Joint mobilization, Joint manipulation, Spinal manipulation, Spinal mobilization, Scar mobilization, Vestibular training, Cryotherapy, and Moist heat  PLAN FOR NEXT SESSION:ask about soreness; step ups; quad strengthening; balance    Kristeen Sar, PT, DPT 04/30/2024 11:28 AM

## 2024-05-03 ENCOUNTER — Ambulatory Visit: Admitting: Physical Therapy

## 2024-05-03 ENCOUNTER — Encounter: Payer: Self-pay | Admitting: Physical Therapy

## 2024-05-03 DIAGNOSIS — M25661 Stiffness of right knee, not elsewhere classified: Secondary | ICD-10-CM | POA: Diagnosis not present

## 2024-05-03 DIAGNOSIS — G8929 Other chronic pain: Secondary | ICD-10-CM

## 2024-05-03 DIAGNOSIS — R2689 Other abnormalities of gait and mobility: Secondary | ICD-10-CM

## 2024-05-03 DIAGNOSIS — R600 Localized edema: Secondary | ICD-10-CM

## 2024-05-03 NOTE — Therapy (Signed)
 OUTPATIENT PHYSICAL THERAPY LOWER EXTREMITY TREATMENT   Patient Name: Sarah Hendricks MRN: [id number] DOB:Jan 21, 1946, 78 y.o., female Today's Date: 05/03/2024  END OF SESSION:  PT End of Session - 05/03/24 1106     Visit Number 5    Date for Recertification  05/30/24    Authorization Type [employer] MCR    Progress Note Due on Visit 10    PT Start Time 1017    PT Stop Time 1057    PT Time Calculation (min) 40 min    Activity Tolerance Patient tolerated treatment well    Behavior During Therapy WFL for tasks assessed/performed              Past Medical History:  Diagnosis Date   Allergy    Anxiety    Arthritis    Basal cell carcinoma (BCC) of left side of nose 2008   Bimalleolar ankle fracture, right, closed, initial encounter    Breast cancer (HCC) 1994   lumpectomy and XRT, no chemo   CAD (coronary artery disease) 2008   PTCA, in [place]   Dysrhythmia    Atrial fibrillation   GERD (gastroesophageal reflux disease)    History of chicken pox    Hyperlipidemia    Hypertension    Myocardial infarction (HCC) 03/15/2007   stent placed   Vitamin D  deficiency    Past Surgical History:  Procedure Laterality Date   BREAST BIOPSY Left 1994   BREAST LUMPECTOMY Left 1994   CARDIOVERSION N/A 07/15/2023   Procedure: CARDIOVERSION;  Surgeon: Loni Soyla LABOR, MD;  Location: [facility];  Service: Cardiovascular;  Laterality: N/A;   CATARACT EXTRACTION, BILATERAL     COLONOSCOPY N/A 11/17/2023   Procedure: COLONOSCOPY;  Surgeon: Leigh Elspeth SQUIBB, MD;  Location: [facility] ENDOSCOPY;  Service: Gastroenterology;  Laterality: N/A;   CORONARY ANGIOPLASTY WITH STENT PLACEMENT  2008   CRANIECTOMY FOR DEPRESSED SKULL FRACTURE  1986   ENTEROSCOPY N/A 11/16/2023   Procedure: ENTEROSCOPY;  Surgeon: Leigh Elspeth SQUIBB, MD;  Location: [facility] ENDOSCOPY;  Service: Gastroenterology;  Laterality: N/A;   ORIF ANKLE FRACTURE Right 06/20/2023   Procedure: RIGHT OPEN REDUCTION INTERNAL FIXATION  (ORIF) ANKLE FRACTURE;  Surgeon: Genelle Elspeth, MD;  Location: [facility];  Service: Orthopedics;  Laterality: Right;   TONSILLECTOMY  1966   TOTAL KNEE ARTHROPLASTY Right 02/23/2024   Procedure: ARTHROPLASTY, KNEE, TOTAL;  Surgeon: Vernetta Lonni GRADE, MD;  Location: [facility] OR;  Service: Orthopedics;  Laterality: Right;  Needs RNFA   TUBAL LIGATION     Patient Active Problem List   Diagnosis Date Noted   Status post total right knee replacement 02/23/2024   Unilateral primary osteoarthritis, right knee 01/04/2024   Abdominal pain 12/05/2023   Heme positive stool 11/16/2023   Anticoagulated 11/16/2023   Anemia 11/15/2023   DOE (dyspnea on exertion) 11/14/2023   Bimalleolar ankle fracture, right, closed, initial encounter 06/20/2023   Atrial fibrillation (HCC) 05/30/2023   Closed fracture of right ankle 05/30/2023   Fall, initial encounter 05/29/2023   Localized swelling of left lower leg 03/02/2023   Left hip pain 05/25/2022   Right knee pain 05/25/2022   Left leg pain 05/25/2022   Hyponatremia 02/15/2022   Stress and adjustment reaction 01/04/2020   Prediabetes 05/26/2017   Osteoporosis 05/30/2014   CAD (coronary artery disease)    Hypertension    Hyperlipidemia    History of breast cancer    GERD (gastroesophageal reflux disease)    Vitamin D  deficiency  PCP:    Geofm Glade PARAS, MD    REFERRING PROVIDER: Gretta Bertrum ORN, PA-C  REFERRING DIAG: [phone] (ICD-10-CM) - Status post total right knee replacement  THERAPY DIAG:  Stiffness of right knee, not elsewhere classified  Chronic pain of right knee  Other abnormalities of gait and mobility  Localized edema  Rationale for Evaluation and Treatment: Rehabilitation  ONSET DATE: Surgery Date 02/23/2024  SUBJECTIVE:   SUBJECTIVE STATEMENT: Patient reports her knee feels stiff today. She has been having a lot of pain recently.   From Eval: Patient presents status post right TKA 02/23/2024. She  reports increased swelling in her right leg, feet, and hands. She had a follow up appointment with her surgeon on Monday and he said she is doing great. She reports increased knee pain when she is doing her HEP exercises but the pan goes away once she is done completing them. She was able to verbalized and demonstrate exercises. She has not taken pain medication in 10 days.   PERTINENT HISTORY: Rt RKA 02/23/2024; Hx Lt breast lumpectomy 1994; Rt ankle ORIF 06/2023 ; HTN; Anxiety PAIN:  Are you having pain? Yes: NPRS scale: 0(currently) 9(worst) /10 Pain location: medial right knee (this is where majority of the arthritis was) Pain description: achy Aggravating factors: Doing exercises Relieving factors: Rest  PRECAUTIONS: Other: post op TKA  RED FLAGS: None   WEIGHT BEARING RESTRICTIONS: No  FALLS:  Has patient fallen in last 6 months? No but she reports a history of falls  LIVING ENVIRONMENT: Lives with: lives with their spouse Lives in: House/apartment Stairs: Yes stairs but she does not have to go up them Has following equipment at home: shower chair  OCCUPATION: Retired  PLOF: Independent, Independent with basic ADLs, Independent with household mobility without device, Independent with community mobility without device, Independent with gait, Independent with transfers, and Leisure: Dealer; playing games on ipad  PATIENT GOALS: To get better  NEXT MD VISIT: 4 months; March 2026  OBJECTIVE:  Note: Objective measures were completed at Evaluation unless otherwise noted.    PATIENT SURVEYS:  LEFS:43/80 53.8%  COGNITION: Overall cognitive status: Within functional limits for tasks assessed     SENSATION: WFL  EDEMA:  Edema noted medial knee compartment   MUSCLE LENGTH: Hamstrings: decreased on right   POSTURE: rounded shoulders and forward head  PALPATION: Increased muscle spasms right hamstring & calf and patient reported tenderness Decreased  superior/inferior right patella  LOWER EXTREMITY ROM:  Active ROM Right eval Left eval  Hip flexion    Hip extension    Hip abduction    Hip adduction    Hip internal rotation    Hip external rotation    Knee flexion A/ROM 108 PROM 115   Knee extension -2   Ankle dorsiflexion    Ankle plantarflexion    Ankle inversion    Ankle eversion     (Blank rows = not tested)  LOWER EXTREMITY MMT: Grossly 4- to 4/5 bilateral    FUNCTIONAL TESTS:  5 times sit to stand: 16.76 sec hands on knees Timed up and go (TUG): 12.99sec 6 minute walk test: 535feet  GAIT: Distance walked: 534 feet Assistive device utilized: None Level of assistance: Complete Independence Comments: narrow BOS; some stances of left lateral trunk deviation  TREATMENT DATE:  05/03/2024 NuStep Level 2 5 mins - PT presents to discuss status Seated knee flexion with slider x 20 Seated hamstring stretch 3 x 30 sec Rt  Seated LAQ 2# 2 x 10 bilateral  Seated hip abduction with black loop x 20 Hurdles (forwards & sideways) x 2 laps each direction Seated flat cone step overs 2 x 10 Rt  Standing heel raise 2 x 10 Standing march x 20 Calf stretch with rockerboard 3 x 30 sec     04/30/2024 NuStep Level 3 5 mins - PT presents to discuss status Seated knee flexion with slider x 20 Seated LAQ 2# 2 x 10 Rt  Seated march with 2# 2 x 10 bilateral  Knee flexion stretch at stair 5 x 10 sec hold Sit to stand x 10 then staggered with Rt behind x 10 (hands on knees) Side stepping at barre with yellow loop around ankles x 3  6 inch step ups (forwards & lateral) x 12 bilateral each direction (UE support at barre) Standing heel raise on airex x 20 Standing march on airex 2 x 10 bilateral (light hand support at barre) Standing hamstring curl 2 x 10 bilateral  Calf stretch with rockerboard 3 x 30  sec    04/26/2024 NuStep Level 3 5 mins - PT presents to discuss status Seated knee flexion with slider x 20 Seated LAQ 2# 2 x 10 Rt  Seated march with 2# 2 x 10 bilateral  Supine SLR 2 x 10 Rt Manual: patellar mobility in all directions for improved knee joint ROM. STM with manual roller to right quads and TFL to decrease muscle spasms and improve knee mobility. Patient enjoyed this. Seated hip abduction with black loop x 20 Seated hamstring curl with green TB2  x 10 Rt  Step ups at stair x 10 bilateral with UE support Calf stretch with rockerboard 3 x 30 sec  Standing hamstring stretch at stair 2 x 30 sec     PATIENT EDUCATION:  Education details: PT eval findings, anticipated POC, progress with PT, and initial HEP Person educated: Patient and Spouse Education method: Explanation, Demonstration, and Handouts Education comprehension: verbalized understanding, returned demonstration, and needs further education  HOME EXERCISE PROGRAM: Access Code: [email or web address] URL: [email or web address] Date: 04/04/2024 Prepared by: Kristeen Sar  Exercises - Active Straight Leg Raise with Quad Set  - 1 x daily - 7 x weekly - 2 sets - 10 reps - Gastroc Stretch on Wall  - 1 x daily - 7 x weekly - 2 sets - 20-30s hold - Soleus Stretch on Wall  - 1 x daily - 7 x weekly - 2 sets - 20-30s hold - Gastroc Stretch with Foot at Wall  - 1 x daily - 7 x weekly - 2 sets - 20-30s hold - Sit to Stand  - 1 x daily - 7 x weekly - 1-2 sets - 10 reps  ASSESSMENT:  CLINICAL IMPRESSION: Cy presents with increased right knee soreness and pain that she attributes to the weather. Incorporated gentle knee ROM exercises in the beginning of session. She verbalized improved knee mobility after performing. Educated patient on exercises she can perform at home to work on mobility to prevent stiffness. Incorporated more single leg balance exercises as well and hip abductor strengthening. She reports three weeks  ago she had trouble getting into the car and she had to ask her neighbor for help. Incorporated lateral cone step overs for this. Overall, patient is progressing well most surgery.  OBJECTIVE IMPAIRMENTS: Abnormal gait, decreased balance, decreased endurance, difficulty walking, decreased ROM, decreased strength, hypomobility, increased edema, increased muscle spasms, impaired flexibility, postural dysfunction, and pain.   ACTIVITY LIMITATIONS: carrying, lifting, bending, sitting, standing, squatting, stairs, transfers, bathing, dressing, and locomotion level  PARTICIPATION LIMITATIONS: meal prep, cleaning, laundry, interpersonal relationship, shopping, community activity, and church  PERSONAL FACTORS: Age, Fitness, Time since onset of injury/illness/exacerbation, and 3+ comorbidities: Hx Lt breast lumpectomy 1994; HTN; Anxiety are also affecting patient's functional outcome.   REHAB POTENTIAL: Good  CLINICAL DECISION MAKING: Evolving/moderate complexity  EVALUATION COMPLEXITY: Moderate   GOALS: Goals reviewed with patient? Yes  SHORT TERM GOALS: Target date: 05/02/2024  Patient will be independent with initial HEP. Baseline:  Goal status: MET 05/03/2024  2.  Patient will report > or = to 40% improvement in ability to perform ADLs and functional tasks since starting PT. Baseline:  Goal status: INITIAL   LONG TERM GOALS: Target date: 05/30/2024  Patient will demonstrate independence in advanced HEP. Baseline:  Goal status: INITIAL  2.  Patient will perform 5STS in < or = to 12 sec due to improved RLE strength and decreased pain. Baseline: 16.76 Goal status: INITIAL  3.  Patient will be able to negotiate to curb safety for improved community mobility & ambulation.  Baseline:  Goal status: INITIAL   4.  Patient will score > or= to 55/80 on LEFS due to improved RLE function and strength. Baseline: LEFS:43/80 53.8% Goal status: INITIAL  5.  Patient will demonstrate  > or = to 4+/5 right knee strength to prevent falls and improve performance of ADLS. Baseline:  Goal status: INITIAL  6.  Patient will ambulate > or= to 662feet during for improved community negotiation.  Baseline: 583feet  Goal status: INITIAL   PLAN:  PT FREQUENCY: 2x/week  PT DURATION: 8 weeks  PLANNED INTERVENTIONS: 97164- PT Re-evaluation, 97110-Therapeutic exercises, 97530- Therapeutic activity, 97112- Neuromuscular re-education, 97535- Self Care, [id number]- Manual therapy, [phone]- Gait training, [phone]- Canalith repositioning, [phone]- Aquatic Therapy, [phone]- Electrical stimulation (unattended), [phone]- Electrical stimulation (manual), [id number]- Vasopneumatic device, [id number]- Ionotophoresis 4mg /ml Dexamethasone , 79439 (1-2 muscles), [id number] (3+ muscles)- Dry Needling, Patient/Family education, Balance training, Stair training, Taping, Joint mobilization, Joint manipulation, Spinal manipulation, Spinal mobilization, Scar mobilization, Vestibular training, Cryotherapy, and Moist heat  PLAN FOR NEXT SESSION:assess 5STS & TUG; step ups; quad strengthening; balance    Kristeen Sar, PT, DPT 05/03/2024 11:07 AM

## 2024-05-08 ENCOUNTER — Ambulatory Visit: Admitting: Physical Therapy

## 2024-05-08 ENCOUNTER — Encounter: Payer: Self-pay | Admitting: Physical Therapy

## 2024-05-08 DIAGNOSIS — M25661 Stiffness of right knee, not elsewhere classified: Secondary | ICD-10-CM | POA: Diagnosis not present

## 2024-05-08 DIAGNOSIS — R2689 Other abnormalities of gait and mobility: Secondary | ICD-10-CM

## 2024-05-08 DIAGNOSIS — G8929 Other chronic pain: Secondary | ICD-10-CM

## 2024-05-08 DIAGNOSIS — R600 Localized edema: Secondary | ICD-10-CM

## 2024-05-08 NOTE — Therapy (Signed)
 " OUTPATIENT PHYSICAL THERAPY LOWER EXTREMITY TREATMENT   Patient Name: Sarah Hendricks MRN: [id number] DOB:Sep 03, 1945, 78 y.o., female Today's Date: 05/08/2024  END OF SESSION:  PT End of Session - 05/08/24 1015     Visit Number 6    Date for Recertification  05/30/24    Authorization Type [employer] MCR    Progress Note Due on Visit 10    PT Start Time 0930    PT Stop Time 1012    PT Time Calculation (min) 42 min    Activity Tolerance Patient tolerated treatment well    Behavior During Therapy WFL for tasks assessed/performed               Past Medical History:  Diagnosis Date   Allergy    Anxiety    Arthritis    Basal cell carcinoma (BCC) of left side of nose 2008   Bimalleolar ankle fracture, right, closed, initial encounter    Breast cancer (HCC) 1994   lumpectomy and XRT, no chemo   CAD (coronary artery disease) 2008   PTCA, in [place]   Dysrhythmia    Atrial fibrillation   GERD (gastroesophageal reflux disease)    History of chicken pox    Hyperlipidemia    Hypertension    Myocardial infarction (HCC) 03/15/2007   stent placed   Vitamin D  deficiency    Past Surgical History:  Procedure Laterality Date   BREAST BIOPSY Left 1994   BREAST LUMPECTOMY Left 1994   CARDIOVERSION N/A 07/15/2023   Procedure: CARDIOVERSION;  Surgeon: Loni Soyla LABOR, MD;  Location: [facility];  Service: Cardiovascular;  Laterality: N/A;   CATARACT EXTRACTION, BILATERAL     COLONOSCOPY N/A 11/17/2023   Procedure: COLONOSCOPY;  Surgeon: Leigh Elspeth SQUIBB, MD;  Location: [facility] ENDOS[facility];  Service: Gastroenterology;  Laterality: N/A;   CORONARY ANGIOPLASTY WITH STENT PLACEMENT  2008   CRANIECTOMY FOR DEPRESSED SKULL FRACTURE  1986   ENTEROSCOPY N/A 11/16/2023   Procedure: ENTEROSCOPY;  Surgeon: Leigh Elspeth SQUIBB, MD;  Location: [facility] ENDOSCOPY;  Service: Gastroenterology;  Laterality: N/A;   ORIF ANKLE FRACTURE Right 06/20/2023   Procedure: RIGHT OPEN REDUCTION INTERNAL  FIXATION (ORIF) ANKLE FRACTURE;  Surgeon: Genelle Elspeth, MD;  Location: [facility];  Service: Orthopedics;  Laterality: Right;   TONSILLECTOMY  1966   TOTAL KNEE ARTHROPLASTY Right 02/23/2024   Procedure: ARTHROPLASTY, KNEE, TOTAL;  Surgeon: Vernetta Lonni GRADE, MD;  Location: [facility] OR;  Service: Orthopedics;  Laterality: Right;  Needs RNFA   TUBAL LIGATION     Patient Active Problem List   Diagnosis Date Noted   Status post total right knee replacement 02/23/2024   Unilateral primary osteoarthritis, right knee 01/04/2024   Abdominal pain 12/05/2023   Heme positive stool 11/16/2023   Anticoagulated 11/16/2023   Anemia 11/15/2023   DOE (dyspnea on exertion) 11/14/2023   Bimalleolar ankle fracture, right, closed, initial encounter 06/20/2023   Atrial fibrillation (HCC) 05/30/2023   Closed fracture of right ankle 05/30/2023   Fall, initial encounter 05/29/2023   Localized swelling of left lower leg 03/02/2023   Left hip pain 05/25/2022   Right knee pain 05/25/2022   Left leg pain 05/25/2022   Hyponatremia 02/15/2022   Stress and adjustment reaction 01/04/2020   Prediabetes 05/26/2017   Osteoporosis 05/30/2014   CAD (coronary artery disease)    Hypertension    Hyperlipidemia    History of breast cancer    GERD (gastroesophageal reflux disease)    Vitamin D  deficiency  PCP:    Geofm Glade PARAS, MD    REFERRING PROVIDER: Gretta Bertrum ORN, PA-C  REFERRING DIAG: [phone] (ICD-10-CM) - Status post total right knee replacement  THERAPY DIAG:  Stiffness of right knee, not elsewhere classified  Chronic pain of right knee  Other abnormalities of gait and mobility  Localized edema  Rationale for Evaluation and Treatment: Rehabilitation  ONSET DATE: Surgery Date 02/23/2024  SUBJECTIVE:   SUBJECTIVE STATEMENT: Patient reports she has some right ankle stiffness. She felt good after last session no increased pain.  From Eval: Patient presents status post  right TKA 02/23/2024. She reports increased swelling in her right leg, feet, and hands. She had a follow up appointment with her surgeon on Monday and he said she is doing great. She reports increased knee pain when she is doing her HEP exercises but the pan goes away once she is done completing them. She was able to verbalized and demonstrate exercises. She has not taken pain medication in 10 days.   PERTINENT HISTORY: Rt RKA 02/23/2024; Hx Lt breast lumpectomy 1994; Rt ankle ORIF 06/2023 ; HTN; Anxiety PAIN:  Are you having pain? Yes: NPRS scale: 0(currently) 9(worst) /10 Pain location: medial right knee (this is where majority of the arthritis was) Pain description: achy Aggravating factors: Doing exercises Relieving factors: Rest  PRECAUTIONS: Other: post op TKA  RED FLAGS: None   WEIGHT BEARING RESTRICTIONS: No  FALLS:  Has patient fallen in last 6 months? No but she reports a history of falls  LIVING ENVIRONMENT: Lives with: lives with their spouse Lives in: House/apartment Stairs: Yes stairs but she does not have to go up them Has following equipment at home: shower chair  OCCUPATION: Retired  PLOF: Independent, Independent with basic ADLs, Independent with household mobility without device, Independent with community mobility without device, Independent with gait, Independent with transfers, and Leisure: Dealer; playing games on ipad  PATIENT GOALS: To get better  NEXT MD VISIT: 4 months; March 2026  OBJECTIVE:  Note: Objective measures were completed at Evaluation unless otherwise noted.    PATIENT SURVEYS:  LEFS:43/80 53.8%  05/08/2024 LEFS 53/80 66.3%  COGNITION: Overall cognitive status: Within functional limits for tasks assessed     SENSATION: WFL  EDEMA:  Edema noted medial knee compartment   MUSCLE LENGTH: Hamstrings: decreased on right   POSTURE: rounded shoulders and forward head  PALPATION: Increased muscle spasms right hamstring & calf  and patient reported tenderness Decreased superior/inferior right patella  LOWER EXTREMITY ROM:  Active ROM Right eval Left eval  Hip flexion    Hip extension    Hip abduction    Hip adduction    Hip internal rotation    Hip external rotation    Knee flexion A/ROM 108 PROM 115   Knee extension -2   Ankle dorsiflexion    Ankle plantarflexion    Ankle inversion    Ankle eversion     (Blank rows = not tested)  LOWER EXTREMITY MMT: Grossly 4- to 4/5 bilateral    FUNCTIONAL TESTS:  5 times sit to stand: 16.76 sec hands on knees Timed up and go (TUG): 12.99sec 6 minute walk test: 585feet   05/08/2024 5STS: 12.10 sec TUG:10.14 sec  GAIT: Distance walked: 534 feet Assistive device utilized: None Level of assistance: Complete Independence Comments: narrow BOS; some stances of left lateral trunk deviation  TREATMENT DATE:  05/08/2024 NuStep Level 2 5 mins - PT presents to discuss status See updated 5STS & TUG & LEFS above Hurdles (step over pattern) x 2 laps - one instance of instability required hand support from PT 6 inch step ups with handhold on Rt and 4# DB hold on Left 2 x 8 (patient got weak on 6th rep of last set) Standing march holding 4# Dbs x 20 first at waist then shoulder height (more challenging)  Side stepping at counter with yellow loop around knees x 3 laps Airex step ups with unilateral UE support x 10 bilateral  4# unilateral farmers carry x 1 lap to cancer gym Hamstring stretch at stair 2 x 30 sec Rt  Calf stretch on rockerboard 2 x 30 sec     05/03/2024 NuStep Level 2 5 mins - PT presents to discuss status Seated knee flexion with slider x 20 Seated hamstring stretch 3 x 30 sec Rt  Seated LAQ 2# 2 x 10 bilateral  Seated hip abduction with black loop x 20 Hurdles (forwards & sideways) x 2 laps each direction Seated  flat cone step overs 2 x 10 Rt  Standing heel raise 2 x 10 Standing march x 20 Calf stretch with rockerboard 3 x 30 sec     04/30/2024 NuStep Level 3 5 mins - PT presents to discuss status Seated knee flexion with slider x 20 Seated LAQ 2# 2 x 10 Rt  Seated march with 2# 2 x 10 bilateral  Knee flexion stretch at stair 5 x 10 sec hold Sit to stand x 10 then staggered with Rt behind x 10 (hands on knees) Side stepping at barre with yellow loop around ankles x 3  6 inch step ups (forwards & lateral) x 12 bilateral each direction (UE support at barre) Standing heel raise on airex x 20 Standing march on airex 2 x 10 bilateral (light hand support at barre) Standing hamstring curl 2 x 10 bilateral  Calf stretch with rockerboard 3 x 30 sec    04/26/2024 NuStep Level 3 5 mins - PT presents to discuss status Seated knee flexion with slider x 20 Seated LAQ 2# 2 x 10 Rt  Seated march with 2# 2 x 10 bilateral  Supine SLR 2 x 10 Rt Manual: patellar mobility in all directions for improved knee joint ROM. STM with manual roller to right quads and TFL to decrease muscle spasms and improve knee mobility. Patient enjoyed this. Seated hip abduction with black loop x 20 Seated hamstring curl with green TB2  x 10 Rt  Step ups at stair x 10 bilateral with UE support Calf stretch with rockerboard 3 x 30 sec  Standing hamstring stretch at stair 2 x 30 sec     PATIENT EDUCATION:  Education details: PT eval findings, anticipated POC, progress with PT, and initial HEP Person educated: Patient and Spouse Education method: Explanation, Demonstration, and Handouts Education comprehension: verbalized understanding, returned demonstration, and needs further education  HOME EXERCISE PROGRAM: Access Code: [email or web address] URL: [email or web address] Date: 04/04/2024 Prepared by: Kristeen Sar  Exercises - Active Straight Leg Raise with Quad Set  - 1 x daily - 7 x weekly - 2 sets - 10 reps -  Gastroc Stretch on Wall  - 1 x daily - 7 x weekly - 2 sets - 20-30s hold - Soleus Stretch on Wall  - 1 x daily - 7 x weekly - 2 sets - 20-30s hold - Gastroc Stretch with Foot at Wall  -  1 x daily - 7 x weekly - 2 sets - 20-30s hold - Sit to Stand  - 1 x daily - 7 x weekly - 1-2 sets - 10 reps  ASSESSMENT:  CLINICAL IMPRESSION: Cy verbalized feeling 80% better since started skilled therapy. She has made great improvements on her TUG and 5STS times. She still has some difficulty negotiating her two garage steps. She feels when leading with her right leg it is a lot weaker. Overall, she is progressing well with skilled therapy. Discussed POC with patient and plan to discharge in three sessions. She has a few instances of instability with balance exercises and required hand held support from PT. Patient will benefit from skilled PT to address the below impairments and improve overall function.     OBJECTIVE IMPAIRMENTS: Abnormal gait, decreased balance, decreased endurance, difficulty walking, decreased ROM, decreased strength, hypomobility, increased edema, increased muscle spasms, impaired flexibility, postural dysfunction, and pain.   ACTIVITY LIMITATIONS: carrying, lifting, bending, sitting, standing, squatting, stairs, transfers, bathing, dressing, and locomotion level  PARTICIPATION LIMITATIONS: meal prep, cleaning, laundry, interpersonal relationship, shopping, community activity, and church  PERSONAL FACTORS: Age, Fitness, Time since onset of injury/illness/exacerbation, and 3+ comorbidities: Hx Lt breast lumpectomy 1994; HTN; Anxiety are also affecting patient's functional outcome.   REHAB POTENTIAL: Good  CLINICAL DECISION MAKING: Evolving/moderate complexity  EVALUATION COMPLEXITY: Moderate   GOALS: Goals reviewed with patient? Yes  SHORT TERM GOALS: Target date: 05/02/2024  Patient will be independent with initial HEP. Baseline:  Goal status: MET 05/03/2024  2.  Patient  will report > or = to 40% improvement in ability to perform ADLs and functional tasks since starting PT. Baseline:  Goal status: MET 05/08/2024   LONG TERM GOALS: Target date: 05/30/2024  Patient will demonstrate independence in advanced HEP. Baseline:  Goal status: INITIAL  2.  Patient will perform 5STS in < or = to 12 sec due to improved RLE strength and decreased pain. Baseline: 16.76 Goal status: MET 05/08/2024  3.  Patient will be able to negotiate to curb safety for improved community mobility & ambulation.  Baseline:  Goal status: INITIAL   4.  Patient will score > or= to 55/80 on LEFS due to improved RLE function and strength. Baseline: LEFS:43/80 53.8% Goal status: IN PROGRESS (53/80)05/08/2024  5.  Patient will demonstrate > or = to 4+/5 right knee strength to prevent falls and improve performance of ADLS. Baseline:  Goal status: INITIAL  6.  Patient will ambulate > or= to 623feet during for improved community negotiation.  Baseline: 532feet  Goal status: INITIAL   PLAN:  PT FREQUENCY: 2x/week  PT DURATION: 8 weeks  PLANNED INTERVENTIONS: 97164- PT Re-evaluation, 97110-Therapeutic exercises, 97530- Therapeutic activity, 97112- Neuromuscular re-education, 97535- Self Care, [id number]- Manual therapy, [phone]- Gait training, [phone]- Canalith repositioning, [id number]- Aquatic Therapy, [phone]- Electrical stimulation (unattended), [phone]- Electrical stimulation (manual), [id number]- Vasopneumatic device, [id number]- Ionotophoresis 4mg /ml Dexamethasone , 79439 (1-2 muscles), [id number] (3+ muscles)- Dry Needling, Patient/Family education, Balance training, Stair training, Taping, Joint mobilization, Joint manipulation, Spinal manipulation, Spinal mobilization, Scar mobilization, Vestibular training, Cryotherapy, and Moist heat  PLAN FOR NEXT SESSION: step ups; quad strengthening; balance    Kristeen Sar, PT, DPT 05/08/2024 10:15 AM        "

## 2024-05-14 ENCOUNTER — Encounter: Payer: Self-pay | Admitting: Internal Medicine

## 2024-05-15 ENCOUNTER — Ambulatory Visit: Admitting: Physical Therapy

## 2024-05-15 ENCOUNTER — Encounter: Payer: Self-pay | Admitting: Physical Therapy

## 2024-05-15 DIAGNOSIS — G8929 Other chronic pain: Secondary | ICD-10-CM

## 2024-05-15 DIAGNOSIS — R600 Localized edema: Secondary | ICD-10-CM

## 2024-05-15 DIAGNOSIS — M25661 Stiffness of right knee, not elsewhere classified: Secondary | ICD-10-CM | POA: Diagnosis not present

## 2024-05-15 DIAGNOSIS — R2689 Other abnormalities of gait and mobility: Secondary | ICD-10-CM

## 2024-05-15 NOTE — Therapy (Signed)
 " OUTPATIENT PHYSICAL THERAPY LOWER EXTREMITY TREATMENT   Patient Name: Sarah Hendricks MRN: [id number] DOB:26-Sep-1945, 78 y.o., female Today's Date: 05/15/2024  END OF SESSION:  PT End of Session - 05/15/24 1147     Visit Number 7    Date for Recertification  05/30/24    Authorization Type BCBS MCR    Progress Note Due on Visit 10    PT Start Time 1101    PT Stop Time 1146    PT Time Calculation (min) 45 min    Activity Tolerance Patient tolerated treatment well    Behavior During Therapy [facility] for tasks assessed/performed                Past Medical History:  Diagnosis Date   Allergy    Anxiety    Arthritis    Basal cell carcinoma (BCC) of left side of nose 2008   Bimalleolar ankle fracture, right, closed, initial encounter    Breast cancer (HCC) 1994   lumpectomy and XRT, no chemo   CAD (coronary artery disease) 2008   PTCA, in [place]   Dysrhythmia    Atrial fibrillation   GERD (gastroesophageal reflux disease)    History of chicken pox    Hyperlipidemia    Hypertension    Myocardial infarction (HCC) 03/15/2007   stent placed   Vitamin D  deficiency    Past Surgical History:  Procedure Laterality Date   BREAST BIOPSY Left 1994   BREAST LUMPECTOMY Left 1994   CARDIOVERSION N/A 07/15/2023   Procedure: CARDIOVERSION;  Surgeon: Loni Soyla LABOR, MD;  Location: [facility];  Service: Cardiovascular;  Laterality: N/A;   CATARACT EXTRACTION, BILATERAL     COLONOSCOPY N/A 11/17/2023   Procedure: COLONOSCOPY;  Surgeon: Leigh Elspeth SQUIBB, MD;  Location: [facility] ENDOSCOPY;  Service: Gastroenterology;  Laterality: N/A;   CORONARY ANGIOPLASTY WITH STENT PLACEMENT  2008   CRANIECTOMY FOR DEPRESSED SKULL FRACTURE  1986   ENTEROSCOPY N/A 11/16/2023   Procedure: ENTEROSCOPY;  Surgeon: Leigh Elspeth SQUIBB, MD;  Location: [facility] ENDOSCOPY;  Service: Gastroenterology;  Laterality: N/A;   ORIF ANKLE FRACTURE Right 06/20/2023   Procedure: RIGHT OPEN REDUCTION INTERNAL  FIXATION (ORIF) ANKLE FRACTURE;  Surgeon: Genelle Elspeth, MD;  Location: [facility];  Service: Orthopedics;  Laterality: Right;   TONSILLECTOMY  1966   TOTAL KNEE ARTHROPLASTY Right 02/23/2024   Procedure: ARTHROPLASTY, KNEE, TOTAL;  Surgeon: Vernetta Lonni GRADE, MD;  Location: [facility] OR;  Service: Orthopedics;  Laterality: Right;  Needs RNFA   TUBAL LIGATION     Patient Active Problem List   Diagnosis Date Noted   Status post total right knee replacement 02/23/2024   Unilateral primary osteoarthritis, right knee 01/04/2024   Abdominal pain 12/05/2023   Heme positive stool 11/16/2023   Anticoagulated 11/16/2023   Anemia 11/15/2023   DOE (dyspnea on exertion) 11/14/2023   Bimalleolar ankle fracture, right, closed, initial encounter 06/20/2023   Atrial fibrillation (HCC) 05/30/2023   Closed fracture of right ankle 05/30/2023   Fall, initial encounter 05/29/2023   Localized swelling of left lower leg 03/02/2023   Left hip pain 05/25/2022   Right knee pain 05/25/2022   Left leg pain 05/25/2022   Hyponatremia 02/15/2022   Stress and adjustment reaction 01/04/2020   Prediabetes 05/26/2017   Osteoporosis 05/30/2014   CAD (coronary artery disease)    Hypertension    Hyperlipidemia    History of breast cancer    GERD (gastroesophageal reflux disease)    Vitamin D  deficiency  PCP:    Geofm Glade PARAS, MD    REFERRING PROVIDER: Gretta Bertrum ORN, PA-C  REFERRING DIAG: [phone] (ICD-10-CM) - Status post total right knee replacement  THERAPY DIAG:  Stiffness of right knee, not elsewhere classified  Chronic pain of right knee  Other abnormalities of gait and mobility  Localized edema  Rationale for Evaluation and Treatment: Rehabilitation  ONSET DATE: Surgery Date 02/23/2024  SUBJECTIVE:   SUBJECTIVE STATEMENT: Patient reports she is doing okay today. No pain. She did some exercises over the small break.  From Eval: Patient presents status post right TKA  02/23/2024. She reports increased swelling in her right leg, feet, and hands. She had a follow up appointment with her surgeon on Monday and he said she is doing great. She reports increased knee pain when she is doing her HEP exercises but the pan goes away once she is done completing them. She was able to verbalized and demonstrate exercises. She has not taken pain medication in 10 days.   PERTINENT HISTORY: Rt RKA 02/23/2024; Hx Lt breast lumpectomy 1994; Rt ankle ORIF 06/2023 ; HTN; Anxiety PAIN:  Are you having pain? Yes: NPRS scale: 0(currently) 9(worst) /10 Pain location: medial right knee (this is where majority of the arthritis was) Pain description: achy Aggravating factors: Doing exercises Relieving factors: Rest  PRECAUTIONS: Other: post op TKA  RED FLAGS: None   WEIGHT BEARING RESTRICTIONS: No  FALLS:  Has patient fallen in last 6 months? No but she reports a history of falls  LIVING ENVIRONMENT: Lives with: lives with their spouse Lives in: House/apartment Stairs: Yes stairs but she does not have to go up them Has following equipment at home: shower chair  OCCUPATION: Retired  PLOF: Independent, Independent with basic ADLs, Independent with household mobility without device, Independent with community mobility without device, Independent with gait, Independent with transfers, and Leisure: Dealer; playing games on ipad  PATIENT GOALS: To get better  NEXT MD VISIT: 4 months; March 2026  OBJECTIVE:  Note: Objective measures were completed at Evaluation unless otherwise noted.    PATIENT SURVEYS:  LEFS:43/80 53.8%  05/08/2024 LEFS 53/80 66.3%  COGNITION: Overall cognitive status: Within functional limits for tasks assessed     SENSATION: WFL  EDEMA:  Edema noted medial knee compartment   MUSCLE LENGTH: Hamstrings: decreased on right   POSTURE: rounded shoulders and forward head  PALPATION: Increased muscle spasms right hamstring & calf and patient  reported tenderness Decreased superior/inferior right patella  LOWER EXTREMITY ROM:  Active ROM Right eval Left eval  Hip flexion    Hip extension    Hip abduction    Hip adduction    Hip internal rotation    Hip external rotation    Knee flexion A/ROM 108 PROM 115   Knee extension -2   Ankle dorsiflexion    Ankle plantarflexion    Ankle inversion    Ankle eversion     (Blank rows = not tested)  LOWER EXTREMITY MMT: Grossly 4- to 4/5 bilateral    FUNCTIONAL TESTS:  5 times sit to stand: 16.76 sec hands on knees Timed up and go (TUG): 12.99sec 6 minute walk test: 546feet   05/08/2024 5STS: 12.10 sec TUG:10.14 sec  GAIT: Distance walked: 534 feet Assistive device utilized: None Level of assistance: Complete Independence Comments: narrow BOS; some stances of left lateral trunk deviation  TREATMENT DATE:  05/15/2024 NuStep Level 1 5 mins - PT presents to discuss status Staggered sit to stand (right behing) x 10 then regular sit to stand holding 4# DB x 10 Seated hamstring curl with red loop x 10 bilateral  Seated clam with red loop x 20 6 inch step ups with unilateral 4# DB hold 2 x 10 Rt  6inch lateral step ups x 10 bilateral at barre Leg press (seat 6) 40# x 10 bilateral ; 15# unilateral x 10 bilateral  Cone tap (2 cones in front and patient having to cross midline) x 8 each Standing hip abduction & extension with red loop around thighs x 10 bilateral Hamstring stretch at stair 2 x 30 sec Rt  Right knee flexion rocking x 10 5 sec hold Calf stretch on rockerboard 2 x 30 sec   Review and update of HEP    05/08/2024 NuStep Level 2 5 mins - PT presents to discuss status See updated 5STS & TUG & LEFS above Hurdles (step over pattern) x 2 laps - one instance of instability required hand support from PT 6 inch step ups with handhold on  Rt and 4# DB hold on Left 2 x 8 (patient got weak on 6th rep of last set) Standing march holding 4# Dbs x 20 first at waist then shoulder height (more challenging)  Side stepping at counter with yellow loop around knees x 3 laps Airex step ups with unilateral UE support x 10 bilateral  4# unilateral farmers carry x 1 lap to cancer gym Hamstring stretch at stair 2 x 30 sec Rt  Calf stretch on rockerboard 2 x 30 sec     05/03/2024 NuStep Level 2 5 mins - PT presents to discuss status Seated knee flexion with slider x 20 Seated hamstring stretch 3 x 30 sec Rt  Seated LAQ 2# 2 x 10 bilateral  Seated hip abduction with black loop x 20 Hurdles (forwards & sideways) x 2 laps each direction Seated flat cone step overs 2 x 10 Rt  Standing heel raise 2 x 10 Standing march x 20 Calf stretch with rockerboard 3 x 30 sec     04/30/2024 NuStep Level 3 5 mins - PT presents to discuss status Seated knee flexion with slider x 20 Seated LAQ 2# 2 x 10 Rt  Seated march with 2# 2 x 10 bilateral  Knee flexion stretch at stair 5 x 10 sec hold Sit to stand x 10 then staggered with Rt behind x 10 (hands on knees) Side stepping at barre with yellow loop around ankles x 3  6 inch step ups (forwards & lateral) x 12 bilateral each direction (UE support at barre) Standing heel raise on airex x 20 Standing march on airex 2 x 10 bilateral (light hand support at barre) Standing hamstring curl 2 x 10 bilateral  Calf stretch with rockerboard 3 x 30 sec     PATIENT EDUCATION:  Education details: PT eval findings, anticipated POC, progress with PT, and initial HEP Person educated: Patient and Spouse Education method: Explanation, Demonstration, and Handouts Education comprehension: verbalized understanding, returned demonstration, and needs further education  HOME EXERCISE PROGRAM: Access Code: [email or web address] URL: [email or web address] Date: 05/15/2024 Prepared by: Kristeen Sar  Exercises -  Active Straight Leg Raise with Quad Set  - 1 x daily - 7 x weekly - 2 sets - 10 reps - Gastroc Stretch on Wall  - 1 x daily - 7 x weekly - 2 sets - 20-30s hold -  Soleus Stretch on Wall  - 1 x daily - 7 x weekly - 2 sets - 20-30s hold - Gastroc Stretch with Foot at Wall  - 1 x daily - 7 x weekly - 2 sets - 20-30s hold - Sit to Stand  - 1 x daily - 7 x weekly - 1-2 sets - 10 reps - Staggered Sit-to-Stand  - 1 x daily - 7 x weekly - 1-2 sets - 10 reps - Seated Hip Abduction with Resistance  - 1 x daily - 7 x weekly - 2 sets - 10 reps - Hip Abduction with Resistance Loop  - 1 x daily - 7 x weekly - 1-2 sets - 10 reps - Hip Extension with Resistance Loop  - 1 x daily - 7 x weekly - 1-2 sets - 10 reps  ASSESSMENT:  CLINICAL IMPRESSION: Cy is progressing well with skilled therapy. We have been focusing on functional strengthening for stair negotiation. Patient verbalized sometimes she does not ascend with the right due to weakness. Educated patient on the importance of ascending with the right for improved performance and strength increases. She did well with the leg press today. PT monitored patient response throughout and provided cues as needed. Patient is on track to discharge within the next 2-3 sessions.      OBJECTIVE IMPAIRMENTS: Abnormal gait, decreased balance, decreased endurance, difficulty walking, decreased ROM, decreased strength, hypomobility, increased edema, increased muscle spasms, impaired flexibility, postural dysfunction, and pain.   ACTIVITY LIMITATIONS: carrying, lifting, bending, sitting, standing, squatting, stairs, transfers, bathing, dressing, and locomotion level  PARTICIPATION LIMITATIONS: meal prep, cleaning, laundry, interpersonal relationship, shopping, community activity, and church  PERSONAL FACTORS: Age, Fitness, Time since onset of injury/illness/exacerbation, and 3+ comorbidities: Hx Lt breast lumpectomy 1994; HTN; Anxiety are also affecting patient's  functional outcome.   REHAB POTENTIAL: Good  CLINICAL DECISION MAKING: Evolving/moderate complexity  EVALUATION COMPLEXITY: Moderate   GOALS: Goals reviewed with patient? Yes  SHORT TERM GOALS: Target date: 05/02/2024  Patient will be independent with initial HEP. Baseline:  Goal status: MET 05/03/2024  2.  Patient will report > or = to 40% improvement in ability to perform ADLs and functional tasks since starting PT. Baseline:  Goal status: MET 05/08/2024   LONG TERM GOALS: Target date: 05/30/2024  Patient will demonstrate independence in advanced HEP. Baseline:  Goal status: INITIAL  2.  Patient will perform 5STS in < or = to 12 sec due to improved RLE strength and decreased pain. Baseline: 16.76 Goal status: MET 05/08/2024  3.  Patient will be able to negotiate to curb safety for improved community mobility & ambulation.  Baseline:  Goal status: INITIAL   4.  Patient will score > or= to 55/80 on LEFS due to improved RLE function and strength. Baseline: LEFS:43/80 53.8% Goal status: IN PROGRESS (53/80)05/08/2024  5.  Patient will demonstrate > or = to 4+/5 right knee strength to prevent falls and improve performance of ADLS. Baseline:  Goal status: INITIAL  6.  Patient will ambulate > or= to 63feet during for improved community negotiation.  Baseline: 554feet  Goal status: INITIAL   PLAN:  PT FREQUENCY: 2x/week  PT DURATION: 8 weeks  PLANNED INTERVENTIONS: 97164- PT Re-evaluation, 97110-Therapeutic exercises, 97530- Therapeutic activity, [id number]- Neuromuscular re-education, 97535- Self Care, [id number]- Manual therapy, [id number]- Gait training, [phone]- Canalith repositioning, [id number]- Aquatic Therapy, [phone]- Electrical stimulation (unattended), [id number]- Electrical stimulation (manual), [id number]- Vasopneumatic device, [id number]- Ionotophoresis 4mg /ml Dexamethasone , 79439 (1-2 muscles), [id number] (3+ muscles)- Dry Needling, Patient/Family education,  Balance training, Stair training,  Taping, Joint mobilization, Joint manipulation, Spinal manipulation, Spinal mobilization, Scar mobilization, Vestibular training, Cryotherapy, and Moist heat  PLAN FOR NEXT SESSION: assess updated HEP; step ups; quad strengthening; balance ; d/c next 2-3 sessions   Kristeen Sar, PT, DPT 05/15/2024 11:48 AM         "

## 2024-05-16 ENCOUNTER — Telehealth: Payer: Self-pay

## 2024-05-16 DIAGNOSIS — D509 Iron deficiency anemia, unspecified: Secondary | ICD-10-CM

## 2024-05-16 NOTE — Telephone Encounter (Signed)
-----   Message from [facility] sent at 02/15/2024 11:02 AM EDT ----- Regarding: due for labs in Jan    recall for CBC and TIBC ferritin in 3 months - early Jan.

## 2024-05-16 NOTE — Telephone Encounter (Signed)
 Order placed for CBC and IBC/Ferrittin. MyChart message to patient

## 2024-05-18 ENCOUNTER — Encounter: Payer: Self-pay | Admitting: Physical Therapy

## 2024-05-18 ENCOUNTER — Ambulatory Visit: Attending: Physician Assistant | Admitting: Physical Therapy

## 2024-05-18 DIAGNOSIS — G8929 Other chronic pain: Secondary | ICD-10-CM | POA: Diagnosis present

## 2024-05-18 DIAGNOSIS — M25561 Pain in right knee: Secondary | ICD-10-CM | POA: Diagnosis present

## 2024-05-18 DIAGNOSIS — R600 Localized edema: Secondary | ICD-10-CM | POA: Insufficient documentation

## 2024-05-18 DIAGNOSIS — M25661 Stiffness of right knee, not elsewhere classified: Secondary | ICD-10-CM | POA: Diagnosis present

## 2024-05-18 DIAGNOSIS — R2689 Other abnormalities of gait and mobility: Secondary | ICD-10-CM | POA: Diagnosis present

## 2024-05-18 NOTE — Therapy (Signed)
 " OUTPATIENT PHYSICAL THERAPY LOWER EXTREMITY TREATMENT AND DISCHARGE SUMMARY   Patient Name: Sarah Hendricks MRN: [id number] DOB:Nov 18, 1945, 79 y.o., female Today's Date: 05/18/2024  END OF SESSION:  PT End of Session - 05/18/24 1012     Visit Number 8    Date for Recertification  05/30/24    Authorization Type BCBS MCR    Progress Note Due on Visit 10    PT Start Time 1012    PT Stop Time 1105    PT Time Calculation (min) 53 min    Activity Tolerance Patient tolerated treatment well    Behavior During Therapy WFL for tasks assessed/performed                 Past Medical History:  Diagnosis Date   Allergy    Anxiety    Arthritis    Basal cell carcinoma (BCC) of left side of nose 2008   Bimalleolar ankle fracture, right, closed, initial encounter    Breast cancer (HCC) 1994   lumpectomy and XRT, no chemo   CAD (coronary artery disease) 2008   PTCA, in [place]   Dysrhythmia    Atrial fibrillation   GERD (gastroesophageal reflux disease)    History of chicken pox    Hyperlipidemia    Hypertension    Myocardial infarction (HCC) 03/15/2007   stent placed   Vitamin D  deficiency    Past Surgical History:  Procedure Laterality Date   BREAST BIOPSY Left 1994   BREAST LUMPECTOMY Left 1994   CARDIOVERSION N/A 07/15/2023   Procedure: CARDIOVERSION;  Surgeon: Loni Soyla LABOR, MD;  Location: [facility];  Service: Cardiovascular;  Laterality: N/A;   CATARACT EXTRACTION, BILATERAL     COLONOSCOPY N/A 11/17/2023   Procedure: COLONOSCOPY;  Surgeon: Leigh Elspeth SQUIBB, MD;  Location: [facility] ENDOSCOPY;  Service: Gastroenterology;  Laterality: N/A;   CORONARY ANGIOPLASTY WITH STENT PLACEMENT  2008   CRANIECTOMY FOR DEPRESSED SKULL FRACTURE  1986   ENTEROSCOPY N/A 11/16/2023   Procedure: ENTEROSCOPY;  Surgeon: Leigh Elspeth SQUIBB, MD;  Location: [facility] ENDOSCOPY;  Service: Gastroenterology;  Laterality: N/A;   ORIF ANKLE FRACTURE Right 06/20/2023   Procedure: RIGHT OPEN  REDUCTION INTERNAL FIXATION (ORIF) ANKLE FRACTURE;  Surgeon: Genelle Elspeth, MD;  Location: [facility];  Service: Orthopedics;  Laterality: Right;   TONSILLECTOMY  1966   TOTAL KNEE ARTHROPLASTY Right 02/23/2024   Procedure: ARTHROPLASTY, KNEE, TOTAL;  Surgeon: Vernetta Lonni GRADE, MD;  Location: [facility] OR;  Service: Orthopedics;  Laterality: Right;  Needs RNFA   TUBAL LIGATION     Patient Active Problem List   Diagnosis Date Noted   Status post total right knee replacement 02/23/2024   Unilateral primary osteoarthritis, right knee 01/04/2024   Abdominal pain 12/05/2023   Heme positive stool 11/16/2023   Anticoagulated 11/16/2023   Anemia 11/15/2023   DOE (dyspnea on exertion) 11/14/2023   Bimalleolar ankle fracture, right, closed, initial encounter 06/20/2023   Atrial fibrillation (HCC) 05/30/2023   Closed fracture of right ankle 05/30/2023   Fall, initial encounter 05/29/2023   Localized swelling of left lower leg 03/02/2023   Left hip pain 05/25/2022   Right knee pain 05/25/2022   Left leg pain 05/25/2022   Hyponatremia 02/15/2022   Stress and adjustment reaction 01/04/2020   Prediabetes 05/26/2017   Osteoporosis 05/30/2014   CAD (coronary artery disease)    Hypertension    Hyperlipidemia    History of breast cancer    GERD (gastroesophageal reflux disease)  Vitamin D  deficiency     PCP:    Geofm Glade PARAS, MD    REFERRING PROVIDER: Gretta Bertrum ORN, PA-C  REFERRING DIAG: [phone] (ICD-10-CM) - Status post total right knee replacement  THERAPY DIAG:  Stiffness of right knee, not elsewhere classified  Chronic pain of right knee  Other abnormalities of gait and mobility  Localized edema  Rationale for Evaluation and Treatment: Rehabilitation  ONSET DATE: Surgery Date 02/23/2024  SUBJECTIVE:   SUBJECTIVE STATEMENT: Knee feels stiff and a little cold in the morning. I've been stepping up with my right leg at home on the stairs.  From Eval:  Patient presents status post right TKA 02/23/2024. She reports increased swelling in her right leg, feet, and hands. She had a follow up appointment with her surgeon on Monday and he said she is doing great. She reports increased knee pain when she is doing her HEP exercises but the pan goes away once she is done completing them. She was able to verbalized and demonstrate exercises. She has not taken pain medication in 10 days.   PERTINENT HISTORY: Rt RKA 02/23/2024; Hx Lt breast lumpectomy 1994; Rt ankle ORIF 06/2023 ; HTN; Anxiety PAIN:  Are you having pain? Yes: NPRS scale: 0(currently) 9(worst) /10 Pain location: medial right knee (this is where majority of the arthritis was) Pain description: achy Aggravating factors: Doing exercises Relieving factors: Rest  PRECAUTIONS: Other: post op TKA  RED FLAGS: None   WEIGHT BEARING RESTRICTIONS: No  FALLS:  Has patient fallen in last 6 months? No but she reports a history of falls  LIVING ENVIRONMENT: Lives with: lives with their spouse Lives in: House/apartment Stairs: Yes stairs but she does not have to go up them Has following equipment at home: shower chair  OCCUPATION: Retired  PLOF: Independent, Independent with basic ADLs, Independent with household mobility without device, Independent with community mobility without device, Independent with gait, Independent with transfers, and Leisure: Dealer; playing games on ipad  PATIENT GOALS: To get better  NEXT MD VISIT: 4 months; March 2026  OBJECTIVE:  Note: Objective measures were completed at Evaluation unless otherwise noted.    PATIENT SURVEYS:  LEFS:43/80 53.8%  05/08/2024 LEFS 53/80 66.3%   05/18/24 LEFS 76 / 80 = 95.0 %  COGNITION: Overall cognitive status: Within functional limits for tasks assessed     SENSATION: WFL  EDEMA:  Edema noted medial knee compartment   MUSCLE LENGTH: Hamstrings: decreased on right   POSTURE: rounded shoulders and forward  head  PALPATION: Increased muscle spasms right hamstring & calf and patient reported tenderness Decreased superior/inferior right patella  LOWER EXTREMITY ROM:  Active ROM Right eval Left eval  Hip flexion    Hip extension    Hip abduction    Hip adduction    Hip internal rotation    Hip external rotation    Knee flexion A/ROM 108 PROM 115   Knee extension -2   Ankle dorsiflexion    Ankle plantarflexion    Ankle inversion    Ankle eversion     (Blank rows = not tested)  LOWER EXTREMITY MMT: Grossly 4- to 4/5 bilateral  05/18/24  4+/5 R knee   FUNCTIONAL TESTS:  5 times sit to stand: 16.76 sec hands on knees Timed up and go (TUG): 12.99sec 6 minute walk test: 558feet   05/08/2024 5STS: 12.10 sec TUG:10.14 sec  05/18/24 680 ft  GAIT: Distance walked: 534 feet Assistive device utilized: None Level of assistance: Complete Independence  Comments: narrow BOS; some stances of left lateral trunk deviation                                                                                                                                 TREATMENT DATE:   05/18/24 NuStep Level 5 5 mins - PT presents to discuss status Staggered sit to stand (right behind) x 10 then regular sit to stand holding 4# DB x 10 Seated hamstring curl with red loop x 10 bilateral  Seated clam with red loop x 20 6 inch step taps x 10 B 6 inch step ups with unilateral 4# DB hold 2 x 10 Rt  6inch lateral step ups x 10 bilateral at barre Leg press (seat 6) 40# 2 x 10 bilateral ; 15# unilateral x 10 bilateral  Cone tap (2 cones in front and patient having to cross midline) x 8 each one LOB backward R SLS 3 way taps to dots x 5 Standing hip abduction & extension with red loop around thighs x 10 bilateral Hamstring stretch at stair 2 x 30 sec Rt  Right knee flexion rocking x 10 5 sec hold Calf stretch on rockerboard x 30 sec     05/15/2024 NuStep Level 1 5 mins - PT presents to discuss  status Staggered sit to stand (right behing) x 10 then regular sit to stand holding 4# DB x 10 Seated hamstring curl with red loop x 10 bilateral  Seated clam with red loop x 20 6 inch step ups with unilateral 4# DB hold 2 x 10 Rt  6inch lateral step ups x 10 bilateral at barre Leg press (seat 6) 40# x 10 bilateral ; 15# unilateral x 10 bilateral  Cone tap (2 cones in front and patient having to cross midline) x 8 each Standing hip abduction & extension with red loop around thighs x 10 bilateral Hamstring stretch at stair 2 x 30 sec Rt  Right knee flexion rocking x 10 5 sec hold Calf stretch on rockerboard 2 x 30 sec   Review and update of HEP    05/08/2024 NuStep Level 2 5 mins - PT presents to discuss status See updated 5STS & TUG & LEFS above Hurdles (step over pattern) x 2 laps - one instance of instability required hand support from PT 6 inch step ups with handhold on Rt and 4# DB hold on Left 2 x 8 (patient got weak on 6th rep of last set) Standing march holding 4# Dbs x 20 first at waist then shoulder height (more challenging)  Side stepping at counter with yellow loop around knees x 3 laps Airex step ups with unilateral UE support x 10 bilateral  4# unilateral farmers carry x 1 lap to cancer gym Hamstring stretch at stair 2 x 30 sec Rt  Calf stretch on rockerboard x 30 sec    PATIENT EDUCATION:  Education details: PT eval findings, anticipated POC, progress with  PT, and initial HEP Person educated: Patient and Spouse Education method: Explanation, Demonstration, and Handouts Education comprehension: verbalized understanding, returned demonstration, and needs further education  HOME EXERCISE PROGRAM: Access Code: [email or web address] URL: [email or web address] Date: 05/15/2024 Prepared by: Kristeen Sar  Exercises - Active Straight Leg Raise with Quad Set  - 1 x daily - 7 x weekly - 2 sets - 10 reps - Gastroc Stretch on Wall  - 1 x daily - 7 x weekly - 2 sets -  20-30s hold - Soleus Stretch on Wall  - 1 x daily - 7 x weekly - 2 sets - 20-30s hold - Gastroc Stretch with Foot at Wall  - 1 x daily - 7 x weekly - 2 sets - 20-30s hold - Sit to Stand  - 1 x daily - 7 x weekly - 1-2 sets - 10 reps - Staggered Sit-to-Stand  - 1 x daily - 7 x weekly - 1-2 sets - 10 reps - Seated Hip Abduction with Resistance  - 1 x daily - 7 x weekly - 2 sets - 10 reps - Hip Abduction with Resistance Loop  - 1 x daily - 7 x weekly - 1-2 sets - 10 reps - Hip Extension with Resistance Loop  - 1 x daily - 7 x weekly - 1-2 sets - 10 reps  ASSESSMENT:  CLINICAL IMPRESSION: Sarah Hendricks has met her LTG. She still demonstrates some quad  and HS weakness with MMT and functional quad weakness with repeated step ups. She reports using a reciprocal gait on stairs at home. She was able to walk 680 ft in five minutes today, but does demonstrate fatigue with ambulation. Her gait speed began to slow in the final minute. She experienced one LOB today with SLS activities, but generally does not appear unsteady with gait and therapeutic exercise. She is compliant with her HEP and requested to be discharged today.      OBJECTIVE IMPAIRMENTS: Abnormal gait, decreased balance, decreased endurance, difficulty walking, decreased ROM, decreased strength, hypomobility, increased edema, increased muscle spasms, impaired flexibility, postural dysfunction, and pain.   ACTIVITY LIMITATIONS: carrying, lifting, bending, sitting, standing, squatting, stairs, transfers, bathing, dressing, and locomotion level  PARTICIPATION LIMITATIONS: meal prep, cleaning, laundry, interpersonal relationship, shopping, community activity, and church  PERSONAL FACTORS: Age, Fitness, Time since onset of injury/illness/exacerbation, and 3+ comorbidities: Hx Lt breast lumpectomy 1994; HTN; Anxiety are also affecting patient's functional outcome.   REHAB POTENTIAL: Good  CLINICAL DECISION MAKING: Evolving/moderate  complexity  EVALUATION COMPLEXITY: Moderate   GOALS: Goals reviewed with patient? Yes  SHORT TERM GOALS: Target date: 05/02/2024  Patient will be independent with initial HEP. Baseline:  Goal status: MET 05/03/2024  2.  Patient will report > or = to 40% improvement in ability to perform ADLs and functional tasks since starting PT. Baseline:  Goal status: MET 05/08/2024   LONG TERM GOALS: Target date: 05/30/2024  Patient will demonstrate independence in advanced HEP. Baseline:  Goal status: MET  2.  Patient will perform 5STS in < or = to 12 sec due to improved RLE strength and decreased pain. Baseline: 16.76 Goal status: MET 05/08/2024  3.  Patient will be able to negotiate to curb safety for improved community mobility & ambulation.  Baseline:  Goal status: MET1/2/26  4.  Patient will score > or= to 55/80 on LEFS due to improved RLE function and strength. Baseline: LEFS:43/80 53.8% Goal status:MET 05/18/24  5.  Patient will demonstrate > or = to 4+/5 right knee strength  to prevent falls and improve performance of ADLS. Baseline:  Goal status: MET 05/18/24  6.  Patient will ambulate > or= to 653feet during for improved community negotiation.  Baseline: 573feet  Goal status: MET 05/18/24   PLAN:  PT FREQUENCY: 2x/week  PT DURATION: 8 weeks  PLANNED INTERVENTIONS: 97164- PT Re-evaluation, 97110-Therapeutic exercises, 97530- Therapeutic activity, 97112- Neuromuscular re-education, 97535- Self Care, 02859- Manual therapy, [phone]- Gait training, [phone]- Canalith repositioning, [id number]- Aquatic Therapy, [phone]- Electrical stimulation (unattended), [phone]- Electrical stimulation (manual), [id number]- Vasopneumatic device, [id number]- Ionotophoresis 4mg /ml Dexamethasone , 79439 (1-2 muscles), 20561 (3+ muscles)- Dry Needling, Patient/Family education, Balance training, Stair training, Taping, Joint mobilization, Joint manipulation, Spinal manipulation, Spinal mobilization, Scar mobilization,  Vestibular training, Cryotherapy, and Moist heat  PLAN FOR NEXT SESSION: see d/c summary  Mliss Cummins, PT  05/18/2024 12:06 PM    PHYSICAL THERAPY DISCHARGE SUMMARY  Visits from Start of Care: 8  Current functional level related to goals / functional outcomes: See above clinical impression.    Remaining deficits: See above clinical impression.    Education / Equipment: HEP   Patient agrees to discharge. Patient goals were met. Patient is being discharged due to meeting the stated rehab goals.   Mliss Cummins, PT 05/18/2024 12:06 PM       "

## 2024-05-21 ENCOUNTER — Ambulatory Visit: Admitting: Physical Therapy

## 2024-05-23 ENCOUNTER — Other Ambulatory Visit (INDEPENDENT_AMBULATORY_CARE_PROVIDER_SITE_OTHER)

## 2024-05-23 DIAGNOSIS — D509 Iron deficiency anemia, unspecified: Secondary | ICD-10-CM

## 2024-05-23 LAB — CBC WITH DIFFERENTIAL/PLATELET
Basophils Absolute: 0.1 K/uL (ref 0.0–0.1)
Basophils Relative: 1.2 % (ref 0.0–3.0)
Eosinophils Absolute: 0.4 K/uL (ref 0.0–0.7)
Eosinophils Relative: 4.8 % (ref 0.0–5.0)
HCT: 37.3 % (ref 36.0–46.0)
Hemoglobin: 12.5 g/dL (ref 12.0–15.0)
Lymphocytes Relative: 15.4 % (ref 12.0–46.0)
Lymphs Abs: 1.2 K/uL (ref 0.7–4.0)
MCHC: 33.5 g/dL (ref 30.0–36.0)
MCV: 95.8 fl (ref 78.0–100.0)
Monocytes Absolute: 0.8 K/uL (ref 0.1–1.0)
Monocytes Relative: 10 % (ref 3.0–12.0)
Neutro Abs: 5.3 K/uL (ref 1.4–7.7)
Neutrophils Relative %: 68.6 % (ref 43.0–77.0)
Platelets: 280 K/uL (ref 150.0–400.0)
RBC: 3.89 Mil/uL (ref 3.87–5.11)
RDW: 15.1 % (ref 11.5–15.5)
WBC: 7.8 K/uL (ref 4.0–10.5)

## 2024-05-23 LAB — IBC + FERRITIN
Ferritin: 30.4 ng/mL (ref 10.0–291.0)
Iron: 90 ug/dL (ref 42–145)
Saturation Ratios: 24.1 % (ref 20.0–50.0)
TIBC: 373.8 ug/dL (ref 250.0–450.0)
Transferrin: 267 mg/dL (ref 212.0–360.0)

## 2024-05-24 ENCOUNTER — Ambulatory Visit: Payer: Self-pay | Admitting: Gastroenterology

## 2024-05-24 ENCOUNTER — Ambulatory Visit: Admitting: Physical Therapy

## 2024-05-28 ENCOUNTER — Ambulatory Visit: Admitting: Physical Therapy

## 2024-05-30 ENCOUNTER — Ambulatory Visit: Admitting: Physical Therapy

## 2024-06-06 ENCOUNTER — Other Ambulatory Visit (HOSPITAL_BASED_OUTPATIENT_CLINIC_OR_DEPARTMENT_OTHER): Payer: Self-pay

## 2024-06-06 ENCOUNTER — Encounter (HOSPITAL_BASED_OUTPATIENT_CLINIC_OR_DEPARTMENT_OTHER): Payer: Self-pay | Admitting: Cardiology

## 2024-06-06 ENCOUNTER — Ambulatory Visit (HOSPITAL_BASED_OUTPATIENT_CLINIC_OR_DEPARTMENT_OTHER): Admitting: Cardiology

## 2024-06-06 VITALS — BP 130/70 | HR 67 | Ht 63.0 in | Wt 143.0 lb

## 2024-06-06 DIAGNOSIS — I480 Paroxysmal atrial fibrillation: Secondary | ICD-10-CM | POA: Diagnosis not present

## 2024-06-06 DIAGNOSIS — E782 Mixed hyperlipidemia: Secondary | ICD-10-CM | POA: Diagnosis not present

## 2024-06-06 DIAGNOSIS — I2510 Atherosclerotic heart disease of native coronary artery without angina pectoris: Secondary | ICD-10-CM | POA: Diagnosis not present

## 2024-06-06 DIAGNOSIS — I5042 Chronic combined systolic (congestive) and diastolic (congestive) heart failure: Secondary | ICD-10-CM | POA: Diagnosis not present

## 2024-06-06 DIAGNOSIS — I10 Essential (primary) hypertension: Secondary | ICD-10-CM

## 2024-06-06 DIAGNOSIS — Z7901 Long term (current) use of anticoagulants: Secondary | ICD-10-CM

## 2024-06-06 DIAGNOSIS — D6869 Other thrombophilia: Secondary | ICD-10-CM

## 2024-06-06 MED ORDER — ROSUVASTATIN CALCIUM 10 MG PO TABS
10.0000 mg | ORAL_TABLET | Freq: Every day | ORAL | 3 refills | Status: AC
  Filled 2024-06-06: qty 90, 90d supply, fill #0

## 2024-06-06 MED ORDER — LOSARTAN POTASSIUM 100 MG PO TABS
100.0000 mg | ORAL_TABLET | Freq: Every day | ORAL | 3 refills | Status: AC
  Filled 2024-06-06: qty 90, 90d supply, fill #0

## 2024-06-06 MED ORDER — FUROSEMIDE 20 MG PO TABS
20.0000 mg | ORAL_TABLET | ORAL | 3 refills | Status: AC
  Filled 2024-06-06: qty 70, 90d supply, fill #0

## 2024-06-06 MED ORDER — PROPRANOLOL HCL 80 MG PO TABS
80.0000 mg | ORAL_TABLET | Freq: Two times a day (BID) | ORAL | 3 refills | Status: AC
  Filled 2024-06-06: qty 180, 90d supply, fill #0

## 2024-06-06 MED ORDER — APIXABAN 5 MG PO TABS
5.0000 mg | ORAL_TABLET | Freq: Two times a day (BID) | ORAL | 3 refills | Status: AC
  Filled 2024-06-06: qty 60, 30d supply, fill #0

## 2024-06-06 NOTE — Patient Instructions (Signed)
 Medication Instructions:  Change furosemide  (Lasix ) to take one tablet 5 days per week *If you need a refill on your cardiac medications before your next appointment, please call your pharmacy*  Lab Work: none If you have labs (blood work) drawn today and your tests are completely normal, you will receive your results only by: MyChart Message (if you have MyChart) OR A paper copy in the mail If you have any lab test that is abnormal or we need to change your treatment, we will call you to review the results.  Testing/Procedures: none  Follow-Up: At [facility], you and your health needs are our priority.  As part of our continuing mission to provide you with exceptional heart care, our providers are all part of one team.  This team includes your primary Cardiologist (physician) and Advanced Practice Providers or APPs (Physician Assistants and Nurse Practitioners) who all work together to provide you with the care you need, when you need it.  Your next appointment:   6 month(s)  Provider:   Shelda Bruckner, MD, Rosaline Bane, NP, or Reche Finder, NP

## 2024-06-06 NOTE — Progress Notes (Signed)
 " Cardiology Office Note:  .   Date:  06/06/2024  ID:  Sarah Hendricks, DOB [birth year], MRN [id number] PCP: Geofm Glade PARAS, MD  [facility] Providers Cardiologist:  Shelda Bruckner, MD {  History of Present Illness: .   Sarah Hendricks is a 79 y.o. female with a hx of paroxysmal atrial fibrillation, HTN, HLD, CAD s/p PCI in 2008 (done in [place] prior to moving), and breast cancer s/p lumpectomy and XRT (no chemo) . She was previously followed by Dr. Maranda and Dr. Hobart. I met her virtually 06/15/2023.  Pertinent CV history: Admitted 05/2023 after a fall from lightheadedness, found to have new afib. Echo 05/30/23 LVEF 45-50%, mild LVH, RV normal. LVEF similar to prior echo 11/2022. Presented to ER 07/03/23 with dyspnea and edema, in afib, started on lasix . Had cardioversion 07/15/23 with improvement in her symptoms.   Today: Apixaban  is very expensive, about $400 right now for four months because of deductible. Discussed pradaxa as alternative at about $55/mos but doesn't count against deductible. She will stay on apixaban .  Has not felt any afib. Doesn't do much activity at baseline, avoids steps, so difficult to assess exertional capacity. No foot edema (has occasional knee swelling at prior surgery site), no shortness of breath at rest. Does have an ankle bursa that swells periodically, lasix  makes it better, takes 5x/week.  ROS: Denies chest pain, shortness of breath at rest or with normal exertion. No PND, orthopnea, change in LE edema or unexpected weight gain. No syncope or palpitations. ROS otherwise negative except as noted.   Studies Reviewed: SABRA    EKG:       Physical Exam:   VS:  BP 130/70 (Cuff Size: Normal)   Pulse 67   Ht 5' 3 (1.6 m)   Wt 143 lb (64.9 kg)   SpO2 97%   BMI 25.33 kg/m    Wt Readings from Last 3 Encounters:  06/06/24 143 lb (64.9 kg)  02/23/24 147 lb (66.7 kg)  02/20/24 147 lb 12.8 oz (67 kg)    GEN: Well nourished, well developed in no acute  distress HEENT: Normal, moist mucous membranes NECK: No JVD CARDIAC: regular rhythm, normal S1 and S2, no rubs or gallops. No murmur. VASCULAR: Radial and DP pulses 2+ bilaterally. No carotid bruits RESPIRATORY:  Clear to auscultation without rales, wheezing or rhonchi  ABDOMEN: Soft, non-tender, non-distended MUSCULOSKELETAL:  Ambulates independently SKIN: Warm and dry, no edema. Prominent ankle bursa bilaterally NEUROLOGIC:  Alert and oriented x 3. No focal neuro deficits noted. PSYCHIATRIC:  Normal affect    ASSESSMENT AND PLAN: .    Paroxysmal atrial fibrillation -symptomatic when she is in afib -chadsvasc=6, secondary hypercoagulable state -continue DOAC -continue propranolol  -discussed medications and possible ablation if this recurs, given her symptoms when she is in afib -has Kardia mobile   Chronic systolic and diastolic heart failure -EF 45-50% with apical akinesis 05/2023. Focal wall motion abnormality with the rest of the wall moving well -symptomatic when in afib -continue propranolol , losartan  -discussed spironolactone , SGLT2i. After shared decision making, will hold on these as she feels she is doing well on current regimen -currently on lasix  5 times/week  Hypertension -continue losartan , propranolol  -hydrochlorothiazide  previously caused worsening hyponatremia (chronically borderline) -reviewed recent BMET  CAD with prior PCI 2008 Hyperlipidemia -no aspirin as she is on DOAC -no angina -continue propranolol , rosuvastatin  -last lipids 01/2023 LDL 54, TG 162 (prior TG 69). Has upcoming annual visit with Dr. Geofm in April -LDL goal <70, TG  goal <150  CV risk counseling and prevention -recommend heart healthy/Mediterranean diet, with whole grains, fruits, vegetable, fish, lean meats, nuts, and olive oil. Limit salt. -recommend moderate walking, 3-5 times/week for 30-50 minutes each session. Aim for at least 150 minutes.week. Goal should be pace of 3  miles/hours, or walking 1.5 miles in 30 minutes -recommend avoidance of tobacco products. Avoid excess alcohol.  Dispo: 6 mos or sooner as needed  Signed, Shelda Bruckner, MD   Shelda Bruckner, MD, PhD, [facility]  [facility]  [place]  [facility] V[facility] at [facility] at [facility] [address] [place], [place] [address] [phone]   "

## 2024-06-09 ENCOUNTER — Other Ambulatory Visit (HOSPITAL_BASED_OUTPATIENT_CLINIC_OR_DEPARTMENT_OTHER): Payer: Self-pay

## 2024-06-11 ENCOUNTER — Other Ambulatory Visit (HOSPITAL_BASED_OUTPATIENT_CLINIC_OR_DEPARTMENT_OTHER): Payer: Self-pay

## 2024-06-26 ENCOUNTER — Ambulatory Visit: Admitting: Gastroenterology

## 2024-07-26 ENCOUNTER — Ambulatory Visit: Admitting: Orthopaedic Surgery

## 2024-08-01 ENCOUNTER — Ambulatory Visit: Admitting: Physician Assistant

## 2024-08-23 ENCOUNTER — Encounter: Admitting: Internal Medicine

## 2024-09-28 ENCOUNTER — Ambulatory Visit
